# Patient Record
Sex: Male | Born: 1970
Health system: Southern US, Community
[De-identification: ages and names within clinical notes are randomized; demographics above are authoritative.]

## PROBLEM LIST (undated history)

## (undated) DIAGNOSIS — I2510 Atherosclerotic heart disease of native coronary artery without angina pectoris: Secondary | ICD-10-CM

## (undated) DIAGNOSIS — I10 Essential (primary) hypertension: Secondary | ICD-10-CM

## (undated) DIAGNOSIS — E119 Type 2 diabetes mellitus without complications: Secondary | ICD-10-CM

## (undated) DIAGNOSIS — G4730 Sleep apnea, unspecified: Secondary | ICD-10-CM

---

## 2002-07-14 HISTORY — PX: CORONARY ANGIOPLASTY WITH STENT PLACEMENT: SHX49

## 2012-08-20 DIAGNOSIS — R079 Chest pain, unspecified: Secondary | ICD-10-CM | POA: Insufficient documentation

## 2015-04-08 DIAGNOSIS — Z8673 Personal history of transient ischemic attack (TIA), and cerebral infarction without residual deficits: Secondary | ICD-10-CM | POA: Insufficient documentation

## 2015-04-08 DIAGNOSIS — I639 Cerebral infarction, unspecified: Secondary | ICD-10-CM | POA: Insufficient documentation

## 2015-05-14 DIAGNOSIS — Z820 Family history of epilepsy and other diseases of the nervous system: Secondary | ICD-10-CM | POA: Insufficient documentation

## 2016-03-24 DIAGNOSIS — R0609 Other forms of dyspnea: Secondary | ICD-10-CM | POA: Insufficient documentation

## 2016-03-24 DIAGNOSIS — R0602 Shortness of breath: Secondary | ICD-10-CM | POA: Insufficient documentation

## 2016-03-24 DIAGNOSIS — R0600 Dyspnea, unspecified: Secondary | ICD-10-CM | POA: Insufficient documentation

## 2016-05-29 DIAGNOSIS — G4733 Obstructive sleep apnea (adult) (pediatric): Secondary | ICD-10-CM | POA: Insufficient documentation

## 2017-05-07 DIAGNOSIS — F418 Other specified anxiety disorders: Secondary | ICD-10-CM | POA: Insufficient documentation

## 2018-06-04 ENCOUNTER — Other Ambulatory Visit: Payer: Self-pay

## 2018-06-04 ENCOUNTER — Emergency Department (HOSPITAL_COMMUNITY)
Admission: EM | Admit: 2018-06-04 | Discharge: 2018-06-04 | Disposition: A | Discharge status: Home / Self Care | Payer: Self-pay | Attending: Emergency Medicine | Admitting: Emergency Medicine

## 2018-06-04 ENCOUNTER — Emergency Department (HOSPITAL_COMMUNITY): Payer: Self-pay

## 2018-06-04 ENCOUNTER — Encounter (HOSPITAL_COMMUNITY): Payer: Self-pay | Admitting: Emergency Medicine

## 2018-06-04 DIAGNOSIS — I2510 Atherosclerotic heart disease of native coronary artery without angina pectoris: Secondary | ICD-10-CM | POA: Insufficient documentation

## 2018-06-04 DIAGNOSIS — E119 Type 2 diabetes mellitus without complications: Secondary | ICD-10-CM | POA: Insufficient documentation

## 2018-06-04 DIAGNOSIS — I10 Essential (primary) hypertension: Secondary | ICD-10-CM | POA: Insufficient documentation

## 2018-06-04 DIAGNOSIS — L02612 Cutaneous abscess of left foot: Secondary | ICD-10-CM | POA: Insufficient documentation

## 2018-06-04 DIAGNOSIS — L03032 Cellulitis of left toe: Secondary | ICD-10-CM | POA: Insufficient documentation

## 2018-06-04 HISTORY — DX: Essential (primary) hypertension: I10

## 2018-06-04 HISTORY — DX: Atherosclerotic heart disease of native coronary artery without angina pectoris: I25.10

## 2018-06-04 HISTORY — DX: Type 2 diabetes mellitus without complications: E11.9

## 2018-06-04 LAB — BASIC METABOLIC PANEL
ANION GAP: 10 (ref 5–15)
BUN: 9 mg/dL (ref 6–20)
CO2: 26 mmol/L (ref 22–32)
Calcium: 9.4 mg/dL (ref 8.9–10.3)
Chloride: 97 mmol/L — ABNORMAL LOW (ref 98–111)
Creatinine, Ser: 0.67 mg/dL (ref 0.61–1.24)
GFR calc non Af Amer: >60 mL/min (ref >60)
Glucose, Bld: 358 mg/dL — ABNORMAL HIGH (ref 70–99)
Potassium: 4.2 mmol/L (ref 3.5–5.1)
SODIUM: 133 mmol/L — AB (ref 135–145)

## 2018-06-04 LAB — CBC WITH DIFFERENTIAL/PLATELET
Abs Immature Granulocytes: 0.04 10*3/uL (ref 0.00–0.07)
BASOS ABS: 0.1 10*3/uL (ref 0.0–0.1)
BASOS PCT: 1 %
Eosinophils Absolute: 0.1 10*3/uL (ref 0.0–0.5)
Eosinophils Relative: 2 %
HEMATOCRIT: 46.6 % (ref 39.0–52.0)
HEMOGLOBIN: 13.9 g/dL (ref 13.0–17.0)
Immature Granulocytes: 1 %
Lymphocytes Relative: 28 %
Lymphs Abs: 2.4 10*3/uL (ref 0.7–4.0)
MCH: 25.6 pg — ABNORMAL LOW (ref 26.0–34.0)
MCHC: 29.8 g/dL — AB (ref 30.0–36.0)
MCV: 86 fL (ref 80.0–100.0)
MONO ABS: 0.6 10*3/uL (ref 0.1–1.0)
Monocytes Relative: 7 %
NEUTROS ABS: 5.1 10*3/uL (ref 1.7–7.7)
NRBC: 0 % (ref 0.0–0.2)
Neutrophils Relative %: 61 %
Platelets: 348 10*3/uL (ref 150–400)
RBC: 5.42 MIL/uL (ref 4.22–5.81)
RDW: 11.5 % (ref 11.5–15.5)
WBC: 8.3 10*3/uL (ref 4.0–10.5)

## 2018-06-04 LAB — CBG MONITORING, ED: GLUCOSE-CAPILLARY: 359 mg/dL — AB (ref 70–99)

## 2018-06-04 MED ORDER — CLINDAMYCIN HCL 150 MG PO CAPS: 150.0000 mg | ORAL_CAPSULE | Freq: Four times a day (QID) | ORAL | 0 refills | Status: DC

## 2018-06-04 NOTE — ED Provider Notes (Signed)
I saw and evaluated the patient, reviewed the resident's note and I agree with the findings and plan with the following exceptions.   47 year old male with history of diabetes a presents the emergency department today with swelling and tenderness to left big toe.  On exam has redness around the toenail with an obvious area of purulence and a crescent-shaped approximately half a centimeter wide.  Has likely neuropathy from diabetes and not much pain there.  Plan to I&D and start antibiotics   EKG Interpretation  Date/Time:    Ventricular Rate:    PR Interval:    QRS Duration:   QT Interval:    QTC Calculation:   R Axis:     Text Interpretation:           Marily Memos, MD 06/05/18 2304

## 2018-06-04 NOTE — ED Triage Notes (Signed)
Pt presents with swelling/redness to left great toe. Hx diabetes and neuropathy. States blood sugars have been consistently >300. Denies fevers at home.

## 2018-06-04 NOTE — ED Provider Notes (Signed)
Patient placed in Quick Look pathway, seen and evaluated   Chief Complaint: Left great toe infection  HPI:  Austin Vaughan is a 47 y.o. male who presents to the emergency department for evaluation of infection to his left great toe.  He reports when he was in the shower he noticed a pocket of pus on the medial aspect of his toe with surrounding redness and it was very tender to the touch.  He denies any injury or trauma to the area.  He has not had any fevers or chills, no nausea or vomiting.  Patient is diabetic and he reports his sugars have been running in the 300s recently.  He takes insulin and metformin regularly.  ROS: + Paronychia, -fevers, chills, nausea, vomiting  Physical Exam:   Gen: No distress  Neuro: Awake and Alert  Skin: Warm    Focused Exam: Large paronychia over the medial aspect of the left great toe with surrounding cellulitis, the pad of the toe is soft and not fluctuant, no concern for felon, no streaking, 2+ DP and PT pulses   Initiation of care has begun. The patient has been counseled on the process, plan, and necessity for staying for the completion/evaluation, and the remainder of the medical screening examination   Legrand Rams 06/04/18 2007    Mesner, Barbara Cower, MD 06/04/18 [phone]

## 2018-06-04 NOTE — ED Provider Notes (Signed)
[facility] EMERGENCY DEPARTMENT Provider Note   CSN: [id number] Arrival date & time: 06/04/18  1938     History   Chief Complaint Chief Complaint  Patient presents with  . Wound Infection    HPI Austin Vaughan is a 47 y.o. male.  HPI She is a 47 year old male with a past medical history of diabetes, CAD, and hypertension who presents to the emergency department for evaluation of infection to his left great toe.  Patient reports that earlier this morning he noticed a pocket of pus on the medial aspect of his left great toe.  He does also have some surrounding erythema but denies any significant pain to the area.  He does have his normal neuropathic pain still present but nothing worse than normal.  He denies any fevers or chills.  He has no chest pain, nausea, or vomiting.  He does report that his baseline blood sugar runs in the 300s.  Of note he just moved from [place] and does not currently have a primary care provider.  Past Medical History:  Diagnosis Date  . Coronary artery disease   . Diabetes mellitus without complication (HCC)   . Hypertension     There are no active problems to display for this patient.   History reviewed. No pertinent surgical history.      Home Medications    Prior to Admission medications   Medication Sig Start Date End Date Taking? Authorizing Provider  clindamycin (CLEOCIN) 150 MG capsule Take 1 capsule (150 mg total) by mouth every 6 (six) hours. 06/04/18   Marionna Gonia, Winfield Rast, MD    Family History No family history on file.  Social History Social History   Tobacco Use  . Smoking status: Never Smoker  . Smokeless tobacco: Never Used  Substance Use Topics  . Alcohol use: Not Currently  . Drug use: Never     Allergies   Patient has no known allergies.   Review of Systems Review of Systems  Constitutional: Negative for chills and fever.  HENT: Negative for ear pain and sore throat.   Eyes: Negative for  pain and visual disturbance.  Respiratory: Negative for cough and shortness of breath.   Cardiovascular: Negative for chest pain and palpitations.  Gastrointestinal: Negative for abdominal pain and vomiting.  Genitourinary: Negative for dysuria and hematuria.  Musculoskeletal: Negative for arthralgias and back pain.  Skin: Positive for color change. Negative for rash.       Purulent fluid collection on patients left great toe.   Neurological: Negative for seizures and syncope.  All other systems reviewed and are negative.    Physical Exam Updated Vital Signs BP (!) 136/94   Pulse 88   Temp 99.7 F (37.6 C) (Oral)   Resp 17   Ht 6\' 2"  (1.88 m)   Wt 102.5 kg   SpO2 100%   BMI 29.02 kg/m   Physical Exam  Constitutional: He appears well-developed and well-nourished.  HENT:  Head: Normocephalic and atraumatic.  Eyes: Conjunctivae are normal.  Neck: Neck supple.  Cardiovascular: Normal rate and regular rhythm.  No murmur heard. Pulmonary/Chest: Effort normal and breath sounds normal. No respiratory distress.  Abdominal: Soft. There is no tenderness.  Musculoskeletal: He exhibits no edema.  Patient with crescent-shaped area of fluctuance present around the nail of his left great toe.  There is some surrounding erythema.  Neurological: He is alert.  Skin: Skin is warm and dry.  Psychiatric: He has a normal mood and  affect.  Nursing note and vitals reviewed.    ED Treatments / Results  Labs (all labs ordered are listed, but only abnormal results are displayed) Labs Reviewed  CBC WITH DIFFERENTIAL/PLATELET - Abnormal; Notable for the following components:      Result Value   MCH 25.6 (*)    MCHC 29.8 (*)    All other components within normal limits  BASIC METABOLIC PANEL - Abnormal; Notable for the following components:   Sodium 133 (*)    Chloride 97 (*)    Glucose, Bld 358 (*)    All other components within normal limits  CBG MONITORING, ED - Abnormal; Notable for  the following components:   Glucose-Capillary 359 (*)    All other components within normal limits    EKG None  Radiology Dg Toe Great Left  Result Date: 06/04/2018 CLINICAL DATA:  Toe infection redness at the nail bed EXAM: LEFT GREAT TOE COMPARISON:  None. FINDINGS: No fracture or malalignment.  No periostitis or bone destruction. IMPRESSION: No acute osseous abnormality Electronically Signed   By: Jasmine Pang M.D.   On: 06/04/2018 20:42    Procedures .Marland KitchenIncision and Drainage Date/Time: 06/05/2018 9:35 PM Performed by: Keith Rake, MD Authorized by: Keith Rake, MD   Consent:    Consent obtained:  Verbal   Consent given by:  Patient   Risks discussed:  Bleeding, incomplete drainage, pain and infection   Alternatives discussed:  No treatment Location:    Type:  Abscess   Location:  Lower extremity   Lower extremity location:  Toe   Toe location:  L big toe Anesthesia (see MAR for exact dosages):    Anesthesia method:  None Procedure type:    Complexity:  Simple Procedure details:    Incision type: 18G needle used to to lift skin from underlying nail.   Incision depth:  Dermal   Drainage:  Purulent   Drainage amount:  Moderate   Wound treatment:  Wound left open   Packing materials:  None Post-procedure details:    Patient tolerance of procedure:  Tolerated well, no immediate complications   (including critical care time)  Medications Ordered in ED Medications - No data to display   Initial Impression / Assessment and Plan / ED Course  I have reviewed the triage vital signs and the nursing notes.  Pertinent labs & imaging results that were available during my care of the patient were reviewed by me and considered in my medical decision making (see chart for details).     Patient is a 47 year old male with past medical history as detailed above who presents emergency department for evaluation of left toe infection.  Patient states that he noticed  the swelling around his left great toe nail earlier this afternoon.  On exam patient does have a crescent-shaped area of fluctuance surrounding the medial aspect of his left great toenail.  Drainage with an 18-gauge needle was performed at bedside.  Patient tolerated this well.  Patient does have some erythema concerning for possible early cellulitis.  As a result patient will be discharged from the emergency department with a prescription for clindamycin.  Also of note, patient did have an elevated blood sugar while in the emergency department.  He has no anion gap or other symptoms to suggest he is currently in DKA.  He is very well-appearing and in no acute distress.  Given the patient is just moved from [place], Sports coach saw him while in the emergency department  and had [facility]  Patient was given contact information for this office.  She was in no acute distress at time of discharge.  Strict return precautions given should his foot infection show any signs of worsening or if he should develop any systemic symptoms such as fevers or chills.  The care of this patient was discussed with my attending physician Dr. Clayborne Dana, who voices agreement with work-up and ED disposition.  Final Clinical Impressions(s) / ED Diagnoses   Final diagnoses:  Abscess of toenail on left foot  Cellulitis of toe of left foot    ED Discharge Orders         Ordered    clindamycin (CLEOCIN) 150 MG capsule  Every 6 hours     06/04/18 2129           Keith Rake, MD 06/05/18 2138    Marily Memos, MD 06/05/18 [phone]

## 2018-06-04 NOTE — ED Notes (Signed)
Patient here for left great toe nail swelling. Denies pain because he has neuropathy to extremities. Reported he has hx of diabetic and only saw this today.

## 2018-06-04 NOTE — Progress Notes (Signed)
EDCM spoke with the patient and his wife at the bedside. Patient states he recently moved here from [place], [place]. He is on disability. He has applied for [place] [employer]. Wife reports he will have Medicare in December. He needs a PCP. Provided the contact information/flyer for [facility] and [facility] and encouraged the patient to call tomorrow to schedule an appointment. Patient verbalizes understanding. Rubie Maid RN CCM

## 2018-06-11 ENCOUNTER — Ambulatory Visit: Payer: Self-pay | Attending: Family Medicine | Admitting: Physician Assistant

## 2018-06-11 VITALS — BP 112/73 | HR 92 | Temp 97.5°F | Resp 18 | Ht 74.0 in | Wt 226.6 lb

## 2018-06-11 DIAGNOSIS — L02612 Cutaneous abscess of left foot: Secondary | ICD-10-CM

## 2018-06-11 DIAGNOSIS — Z9119 Patient's noncompliance with other medical treatment and regimen: Secondary | ICD-10-CM | POA: Insufficient documentation

## 2018-06-11 DIAGNOSIS — Z794 Long term (current) use of insulin: Secondary | ICD-10-CM | POA: Insufficient documentation

## 2018-06-11 DIAGNOSIS — I2510 Atherosclerotic heart disease of native coronary artery without angina pectoris: Secondary | ICD-10-CM | POA: Insufficient documentation

## 2018-06-11 DIAGNOSIS — I10 Essential (primary) hypertension: Secondary | ICD-10-CM | POA: Insufficient documentation

## 2018-06-11 DIAGNOSIS — E1165 Type 2 diabetes mellitus with hyperglycemia: Secondary | ICD-10-CM | POA: Insufficient documentation

## 2018-06-11 DIAGNOSIS — L03032 Cellulitis of left toe: Secondary | ICD-10-CM | POA: Insufficient documentation

## 2018-06-11 DIAGNOSIS — E1142 Type 2 diabetes mellitus with diabetic polyneuropathy: Secondary | ICD-10-CM

## 2018-06-11 LAB — GLUCOSE, POCT (MANUAL RESULT ENTRY): POC Glucose: 243 mg/dl — AB (ref 70–99)

## 2018-06-11 MED ORDER — NORTRIPTYLINE HCL 50 MG PO CAPS: 50.0000 mg | ORAL_CAPSULE | Freq: Every day | ORAL | 5 refills | Status: DC

## 2018-06-11 MED ORDER — GABAPENTIN 600 MG PO TABS: 4800.0000 mg | ORAL_TABLET | Freq: Every day | ORAL | 5 refills | Status: DC

## 2018-06-11 MED ORDER — ATORVASTATIN CALCIUM 40 MG PO TABS: 40.0000 mg | ORAL_TABLET | Freq: Every day | ORAL | 5 refills | Status: DC

## 2018-06-11 MED FILL — NORTRIPTYLINE HCL 50 MG CAP: 50 | 30 days supply | Qty: 30 | Fill #0

## 2018-06-11 MED FILL — GABAPENTIN 600 MG TABLET: 600 | 30 days supply | Qty: 240 | Fill #0

## 2018-06-11 MED FILL — ATORVASTATIN 40 MG TABLET: 40 | 30 days supply | Qty: 30 | Fill #0

## 2018-06-11 NOTE — Progress Notes (Signed)
Austin Vaughan  ZOX:[id number]  WJX:[id number]  DOB - 1971/07/24  Chief Complaint  Patient presents with  . Follow-up    Left Big Toe Abcess>ED       Subjective:   Austin Vaughan is a 47 y.o. male here today for establishment of care.  He has a past medical history of diabetes mellitus type 2 with diabetic neuropathy, coronary artery disease status post percutaneous coronary intervention in the past and hypertension.  He recently moved here (1 month ago) from [place].  He presented to the emergency department on 06/04/2018 with pus being noted on his left foot at the great toe at the medial aspect.  He was having redness and tenderness.  He did not recall any type of injury.  He had not been running any fevers.  He went to the emergency department and his blood sugar was in the 300s but he did not have an anion gap.  His temperature was 99.7.  His physical examination was positive for large paronychia of the left great toe.  X-ray was negative for any type of osteomyelitis.  He status post incision and drainage with good response.  He then was placed on clindamycin for 7 days.  In regards to his blood sugar he was encouraged to go back on his last known regimen which was Lantus 12 units at bedtime, NovoLog 14 units 3 times daily with meals, Glucotrol 10 mg daily.  He has been taking the Glucotrol and Lantus but has not been taking the NovoLog per sliding scale with meals.  He has been compliant with his other medications.  He is inconsistent with how he checks his blood sugar.  Usually he only checks it once every 3 or 4 days.  His hemoglobin A1c in June of this year was 9.2%.  His foot is fine.  No new complaints there.   ROS: GEN: denies fever or chills, denies change in weight Skin: denies lesions or rashes HEENT: denies headache, earache, epistaxis, sore throat, or neck pain LUNGS: denies SHOB, dyspnea, PND, orthopnea CV: denies CP or palpitations ABD: denies abd pain, N or V EXT: denies  muscle spasms or swelling; no pain in lower ext, no weakness NEURO: denies numbness or tingling, denies sz, stroke or TIA  ALLERGIES: Allergies  Allergen Reactions  . Ambien [Zolpidem Tartrate] Other (See Comments)    HALLUCINATIONS  . Shellfish Allergy Anaphylaxis    Seafood    PAST MEDICAL HISTORY: Past Medical History:  Diagnosis Date  . Coronary artery disease   . Diabetes mellitus without complication (HCC)   . Hypertension     PAST SURGICAL HISTORY: History reviewed. No pertinent surgical history.  MEDICATIONS AT HOME: Prior to Admission medications   Medication Sig Start Date End Date Taking? Authorizing Provider  albuterol (PROAIR HFA) 108 (90 Base) MCG/ACT inhaler Inhale 2 puffs into the lungs every 6 (six) hours as needed for wheezing or shortness of breath.   Yes [provider]  amLODipine (NORVASC) 10 MG tablet Take 10 mg by mouth daily.   Yes [provider]  atorvastatin (LIPITOR) 40 MG tablet Take 1 tablet (40 mg total) by mouth daily. 06/11/18  Yes Danelle Earthly, Raphael Espe S, PA-C  clindamycin (CLEOCIN) 150 MG capsule Take 1 capsule (150 mg total) by mouth every 6 (six) hours. 06/04/18  Yes Rozier, Winfield Rast, MD  dicyclomine (BENTYL) 20 MG tablet Take 20 mg by mouth 4 (four) times daily.   Yes [provider]  gabapentin (NEURONTIN) 600 MG  tablet Take 8 tablets (4,800 mg total) by mouth at bedtime. Yes, 4800 mg daily 06/11/18  Yes Barbaraann Avans S, PA-C  glipiZIDE (GLUCOTROL) 10 MG tablet Take 10 mg by mouth daily before breakfast.   Yes [provider]  insulin aspart (NOVOLOG FLEXPEN) 100 UNIT/ML FlexPen Inject 14 Units into the skin 3 (three) times daily with meals.   Yes [provider]  Insulin Glargine (LANTUS SOLOSTAR) 100 UNIT/ML Solostar Pen Inject 12 Units into the skin at bedtime.   Yes [provider]  montelukast (SINGULAIR) 10 MG tablet Take 10 mg by mouth daily.   Yes [provider]  nitroGLYCERIN  (NITRODUR - DOSED IN MG/24 HR) 0.4 mg/hr patch Place 0.4 mg onto the skin daily.   Yes [provider]  nitroGLYCERIN (NITROSTAT) 0.4 MG SL tablet Place 0.4 mg under the tongue every 5 (five) minutes as needed for chest pain.   Yes [provider]  nortriptyline (PAMELOR) 50 MG capsule Take 1 capsule (50 mg total) by mouth at bedtime. 06/11/18  Yes Danelle Earthly, Laneah Luft S, PA-C  ramipril (ALTACE) 10 MG capsule Take 10 mg by mouth daily.   Yes [provider]    History reviewed. No pertinent family history.  @SOCIALHX @  Objective:   Vitals:   06/11/18 1149  BP: 112/73  Pulse: 92  Resp: 18  Temp: (!) 97.5 F (36.4 C)  TempSrc: Oral  SpO2: 98%  Weight: 226 lb 9.6 oz (102.8 kg)  Height: 6\' 2"  (1.88 m)    Exam General appearance : Awake, alert, not in any distress. Speech Clear. Not toxic looking HEENT: Atraumatic and Normocephalic, pupils equally reactive to light and accomodation Neck: supple, no JVD. No cervical lymphadenopathy.  Chest:Good air entry bilaterally, no added sounds  CVS: S1 S2 regular, no murmurs.  Abdomen: Bowel sounds present, Non tender and not distended with no guarding, rigidity or rebound. Extremities: B/L Lower Ext shows no edema, both legs are warm to touch Neurology: Awake alert, and oriented X 3, CN II-XII intact, Non focal Skin:No Rash Wounds:healing left medial great toe   Assessment & Plan  1. Left great toe cellulitis/abscess-  -s/p I & D  -completed antibiotics 2. DM 2 with hyperglycemia and noncompliance  -education  -cont Lantus 12 U daily, likely will need to increase  -Novolog 14 U TID with meals  -accu check TID-QID and keep a log and bring to next visit 3. HTN-stable 4. CAD-stable   appt with pharm D appt with financial counselor Return in about 2 weeks (around 06/25/2018).  The patient was given clear instructions to go to ER or return to medical center if symptoms don't improve, worsen or new problems  develop. The patient verbalized understanding. The patient was told to call to get lab results if they haven't heard anything in the next week.   Total time spent with patient was 49 min. Greater than 50 % of this visit was spent face to face counseling and coordinating care regarding risk factor modification, compliance importance and encouragement, education related to diabetes.  This note has been created with Education officer, environmental. Any transcriptional errors are unintentional.    Scot Jun, PA-C [facility] [place], [place] [phone]   06/11/2018, 1:32 PM

## 2018-06-11 NOTE — Patient Instructions (Signed)
Aim for 30 minutes of exercise most days. Rethink what you drink. Water is great! Aim for 2-3 Carb Choices per meal (30-45 grams) +/- 1 either way  Aim for 0-15 Carbs per snack if hungry  Include protein in moderation with your meals and snacks  Consider reading food labels for Total Carbohydrate and Fat Grams of foods  Consider checking BG at alternate times per day  Continue taking medication as directed Be mindful about how much sugar you are adding to beverages and other foods. Fruit Punch - find one with no sugar  Measure and decrease portions of carbohydrate foods  Make your plate and don't go back for seconds  

## 2018-06-11 NOTE — Progress Notes (Signed)
253

## 2018-06-24 ENCOUNTER — Ambulatory Visit: Payer: Self-pay | Attending: Family Medicine

## 2018-06-25 ENCOUNTER — Ambulatory Visit: Payer: Self-pay | Admitting: Family Medicine

## 2018-07-01 ENCOUNTER — Other Ambulatory Visit: Payer: Self-pay | Admitting: Pharmacist

## 2018-07-01 MED ORDER — AMLODIPINE BESYLATE 10 MG PO TABS: 10.0000 mg | ORAL_TABLET | Freq: Every day | ORAL | 0 refills | Status: DC

## 2018-07-01 MED ORDER — RAMIPRIL 10 MG PO CAPS: 10.0000 mg | ORAL_CAPSULE | Freq: Every day | ORAL | 0 refills | Status: DC

## 2018-07-01 MED FILL — RAMIPRIL 5 MG CAPS: 5 | 8 days supply | Qty: 16 | Fill #0

## 2018-07-01 MED FILL — AMLODIPINE BESYLATE 10 MG T: 10 | 8 days supply | Qty: 8 | Fill #0

## 2018-07-08 ENCOUNTER — Encounter: Payer: Self-pay | Admitting: Family Medicine

## 2018-07-08 ENCOUNTER — Ambulatory Visit: Payer: Self-pay | Attending: Family Medicine | Admitting: Family Medicine

## 2018-07-08 VITALS — BP 142/94 | HR 101 | Temp 98.1°F | Ht 74.0 in | Wt 228.0 lb

## 2018-07-08 DIAGNOSIS — E0842 Diabetes mellitus due to underlying condition with diabetic polyneuropathy: Secondary | ICD-10-CM

## 2018-07-08 DIAGNOSIS — Z79899 Other long term (current) drug therapy: Secondary | ICD-10-CM

## 2018-07-08 DIAGNOSIS — R079 Chest pain, unspecified: Secondary | ICD-10-CM

## 2018-07-08 DIAGNOSIS — I2510 Atherosclerotic heart disease of native coronary artery without angina pectoris: Secondary | ICD-10-CM

## 2018-07-08 DIAGNOSIS — R0789 Other chest pain: Secondary | ICD-10-CM | POA: Insufficient documentation

## 2018-07-08 DIAGNOSIS — Z794 Long term (current) use of insulin: Secondary | ICD-10-CM

## 2018-07-08 DIAGNOSIS — E1142 Type 2 diabetes mellitus with diabetic polyneuropathy: Secondary | ICD-10-CM

## 2018-07-08 DIAGNOSIS — R1084 Generalized abdominal pain: Secondary | ICD-10-CM

## 2018-07-08 DIAGNOSIS — I10 Essential (primary) hypertension: Secondary | ICD-10-CM

## 2018-07-08 LAB — POCT GLYCOSYLATED HEMOGLOBIN (HGB A1C): Hemoglobin A1C: 10 % — AB (ref 4.0–5.6)

## 2018-07-08 LAB — GLUCOSE, POCT (MANUAL RESULT ENTRY): POC Glucose: 480 mg/dL — AB (ref 70–99)

## 2018-07-08 MED ORDER — ATORVASTATIN CALCIUM 40 MG PO TABS: 40.0000 mg | ORAL_TABLET | Freq: Every day | ORAL | 5 refills | Status: DC

## 2018-07-08 MED ORDER — GABAPENTIN 600 MG PO TABS: 3600.0000 mg | ORAL_TABLET | Freq: Every day | ORAL | 5 refills | Status: DC

## 2018-07-08 MED ORDER — GLIPIZIDE 10 MG PO TABS: 10.0000 mg | ORAL_TABLET | Freq: Every day | ORAL | 4 refills | Status: DC

## 2018-07-08 MED ORDER — AMLODIPINE BESYLATE 10 MG PO TABS: 10.0000 mg | ORAL_TABLET | Freq: Every day | ORAL | 0 refills | Status: DC

## 2018-07-08 MED ORDER — INSULIN GLARGINE 100 UNIT/ML SOLOSTAR PEN: 20.0000 [IU] | PEN_INJECTOR | Freq: Every day | SUBCUTANEOUS | 3 refills | Status: DC

## 2018-07-08 MED FILL — AMLODIPINE BESYLATE 10 MG T: 10 | 8 days supply | Qty: 8 | Fill #0

## 2018-07-08 MED FILL — ATORVASTATIN CALCIUM 40 MG: 40 | 30 days supply | Qty: 30 | Fill #0

## 2018-07-08 MED FILL — NORTRIPTYLINE HCL 50 MG CAP: 50 | 30 days supply | Qty: 30 | Fill #1

## 2018-07-08 MED FILL — LANTUS SOLOSTAR 100 UNITS/M: 100 | 30 days supply | Qty: 6 | Fill #0

## 2018-07-08 MED FILL — GABAPENTIN 600 MG TABLET: 600 | 30 days supply | Qty: 180 | Fill #0

## 2018-07-08 MED FILL — glipiZIDE 10 MG TABS: 10 | 30 days supply | Qty: 30 | Fill #0

## 2018-07-08 NOTE — Progress Notes (Signed)
Subjective:    Patient ID: Austin Vaughan, male    DOB: 1971/06/02, 47 y.o.   MRN: [id number]  HPI       47 year old male who is new to me as a patient but was seen in this office on 06/11/2018 to establish care status post emergency department visit on 06/04/2018 due to an abscess of the left great toe near the toenail.  Patient has a history of type 2 diabetes with diabetic neuropathy.  Patient reports that he recently moved to the area from [place].  Patient reports that he has neuropathy in both his hands and feet and patient reports that he takes 8-10 gabapentin 600 mg at bedtime along with 7-8 OTC ibuprofen as his pain is greater than 200 on a 0-10 pain scale was 0 being no pain and 10 being the worst pain imaginable.  Patient has taken reflux medication in the past but does not currently taking reflux medicine but does take a medication for abdominal pain.  Patient also reports a history of coronary artery disease and had a stress test just before he left [place].  Patient has had some recurrent dull chest pain.  Chest pain is generally substernal and left-sided.  Patient states that he is not aware of the results.  Patient reports prior heart cath and stenting.  Patient reports that he is taking the insulin that was prescribed for his diabetes but his blood sugars continue to run in the 200s and 300s.  Patient reports that he does need some medication refills at today's visit.      Patient reports past medical history of heart disease status post stenting as well as diabetes with neuropathy and hypertension.  Patient reports a history of allergy to Ambien, wife who is with the patient at today's visit reports that the patient became violent after taking the medication.  Patient reports that his only surgery has been a heart cath.  Patient states that he does not drink or smoke.  Patient reports a family history significant for his mother and 2 maternal aunts all having ALS.  Patient reports that his  father was a smoker and died from lung cancer.  Patient reports a strong history of CAD on the paternal side of his family.          Review of Systems  Constitutional: Positive for fatigue. Negative for chills and fever.  HENT: Positive for congestion and postnasal drip. Negative for trouble swallowing.   Eyes: Positive for itching. Negative for photophobia and visual disturbance.  Respiratory: Negative for cough and shortness of breath.   Cardiovascular: Positive for chest pain and palpitations (Sensation of increased heart rate at times). Negative for leg swelling.  Gastrointestinal: Positive for abdominal pain (Complain of generalized, crampy type abdominal pain). Negative for blood in stool and nausea.  Endocrine: Negative for polydipsia, polyphagia and polyuria.  Genitourinary: Negative for dysuria and frequency.  Musculoskeletal: Positive for arthralgias and gait problem.  Neurological: Positive for numbness. Negative for dizziness and light-headedness.       Objective:   Physical Exam BP (!) 142/94   Pulse (!) 101   Temp 98.1 F (36.7 C) (Oral)   Ht 6\' 2"  (1.88 m)   Wt 228 lb (103.4 kg)   SpO2 97%   BMI 29.27 kg/m Nurse's notes and vital signs reviewed General-well-nourished, well-developed male in no acute distress.  Patient is accompanied by his wife at today's visit. EENT- conjunctiva clear, extraocular movements intact, TMs dull bilaterally, nares  with mild edema, patient with mild posterior pharynx erythema Neck-supple, no lymphadenopathy, no thyromegaly Cardiovascular-regular rate and rhythm Abdomen-soft, nontender Back-no CVA tenderness. Extremities-no edema Diabetic foot exam- patient does not have any evidence of edema/erythema or increased warmth of the left great toe and does not have any tenderness to palpation of this area where he previously had an abscess that required I&D.  Patient with 1+ peripheral pulses-dorsalis pedis and posterior tibial bilaterally.   Patient with abnormal monofilament bilaterally as patient only sensed palpation on the top and not the soles of the feet.  No active skin breakdown on the feet        Assessment & Plan:  1. Type 2 diabetes mellitus with diabetic polyneuropathy, with long-term current use of insulin (HCC) Patient had hemoglobin A1c done at today's visit as I did not see prior A1c on review of labs.  Patient's hemoglobin A1c was at 10.0 indicating poor control of his diabetes.  Patient will also have CMP and CBC.  Patient requests a refill of glipizide as well as Lantus and these were refilled with patient is also encouraged to bring diabetic monitor and diary and make follow-up appointment to see the clinical pharmacist to help with control of his blood sugars. - POCT glucose (manual entry) - POCT glycosylated hemoglobin (Hb A1C) - Comprehensive metabolic panel - CBC with Differential - glipiZIDE (GLUCOTROL) 10 MG tablet; Take 1 tablet (10 mg total) by mouth daily before breakfast.  Dispense: 30 tablet; Refill: 4 - Insulin Glargine (LANTUS SOLOSTAR) 100 UNIT/ML Solostar Pen; Inject 20 Units into the skin at bedtime.  Dispense: 15 mL; Refill: 3  2. Coronary artery disease involving native coronary artery of native heart, angina presence unspecified Patient with history of coronary artery disease and reports that he had a stress test prior to leaving [place] but is not aware the results.  Patient reports he has had some recurrent left-sided chest pain.  Patient is being referred to cardiology for further evaluation and treatment and patient is to continue use of a daily aspirin as well as atorvastatin 40 for which he was provided with a refill at today's visit. - Ambulatory referral to Cardiology - atorvastatin (LIPITOR) 40 MG tablet; Take 1 tablet (40 mg total) by mouth daily.  Dispense: 30 tablet; Refill: 5  3. Recurrent chest pain Patient is being referred to cardiology secondary to his complaint of recurrent  chest pain and patient with known history of coronary artery disease - Ambulatory referral to Cardiology  4. Diabetic polyneuropathy associated with diabetes mellitus due to underlying condition [facility]) Patient requests referral to neurology in follow-up of his polyneuropathy.  Patient reported taking 8 gabapentin and 6 OTC ibuprofen each night to help with pain.  Patient is also being referred to pain clinic to see if there are any other options.  I discussed with the patient and his wife that a 600 mg gabapentin was likely excessive.  I spoke briefly with the clinical pharmacist and patient is on a very high dose, likely excessive of gabapentin especially since his dose is not divided throughout the day.  Patient was provided with prescription for gabapentin to take up to 6 pills at bedtime and patient was advised to make sure that he is eaten prior to taking ibuprofen and that he should try not to exceed more than 800 mg of Motrin every 8 hours.  Patient will have CMP and CBC in follow-up of his excessive use of medication. - Ambulatory referral to Neurology -  gabapentin (NEURONTIN) 600 MG tablet; Take 6 tablets (3,600 mg total) by mouth at bedtime.  Dispense: 240 tablet; Refill: 5 - Ambulatory referral to Pain Clinic  5. Generalized abdominal pain Patient with complaint of some generalized abdominal pain.  Patient will have CMP and CBC.  Patient will be referred to gastroenterology for further evaluation and treatment.  Patient is currently taking an excessive amount of ibuprofen and gabapentin nightly.  Patient denies any blood in the stool and no nausea.  Patient however states that he has been taking medication for abdominal pain and is provided with a prescription for Bentyl.  Patient declines reflux medication. - Ambulatory referral to Gastroenterology - Comprehensive metabolic panel - CBC with Differential  6. Long-term use of high-risk medication Patient will be referred to gastroenterology  due to his long-term use of ibuprofen as well as complaint of generalized abdominal pain.  Patient will have CBC and CMP done in follow-up of long-term use of medications - Ambulatory referral to Gastroenterology - Comprehensive metabolic panel - CBC with Differential  7. Essential hypertension Patient's blood pressure was slightly elevated above normal, I am not sure if this is related to patient's current pain complaints.  Patient is provided a refill of amlodipine and is currently on metoprolol but likely would also benefit from an ACE inhibitor or ARB since patient is diabetic.  Will discuss with patient his next visit and also obtain a urine microalbumin. - amLODipine (NORVASC) 10 MG tablet; Take 1 tablet (10 mg total) by mouth daily.  Dispense: 8 tablet; Refill: 0  *Patient was offered but declined influenza immunization at today's visit  An After Visit Summary was printed and given to the patient.  Return in about 6 weeks (around 08/19/2018) for DM; 2-3 weeks with Franky MachoLuke; .

## 2018-07-09 ENCOUNTER — Encounter: Payer: Self-pay | Admitting: Neurology

## 2018-07-09 LAB — CBC WITH DIFFERENTIAL/PLATELET
Basophils Absolute: 0.1 x10E3/uL (ref 0.0–0.2)
Basos: 1 %
EOS (ABSOLUTE): 0.2 x10E3/uL (ref 0.0–0.4)
Eos: 2 %
Hematocrit: 46.3 % (ref 37.5–51.0)
Hemoglobin: 14.8 g/dL (ref 13.0–17.7)
Immature Grans (Abs): 0 x10E3/uL (ref 0.0–0.1)
Immature Granulocytes: 0 %
Lymphocytes Absolute: 3.3 x10E3/uL — ABNORMAL HIGH (ref 0.7–3.1)
Lymphs: 34 %
MCH: 26.8 pg (ref 26.6–33.0)
MCHC: 32 g/dL (ref 31.5–35.7)
MCV: 84 fL (ref 79–97)
Monocytes Absolute: 0.7 x10E3/uL (ref 0.1–0.9)
Monocytes: 7 %
Neutrophils Absolute: 5.4 x10E3/uL (ref 1.4–7.0)
Neutrophils: 56 %
Platelets: 383 x10E3/uL (ref 150–450)
RBC: 5.53 x10E6/uL (ref 4.14–5.80)
RDW: 11 % — ABNORMAL LOW (ref 12.3–15.4)
WBC: 9.6 x10E3/uL (ref 3.4–10.8)

## 2018-07-09 LAB — COMPREHENSIVE METABOLIC PANEL WITH GFR
ALT: 30 IU/L (ref 0–44)
AST: 11 IU/L (ref 0–40)
Albumin/Globulin Ratio: 1.7 (ref 1.2–2.2)
Albumin: 4.6 g/dL (ref 3.5–5.5)
Alkaline Phosphatase: 137 IU/L — ABNORMAL HIGH (ref 39–117)
BUN/Creatinine Ratio: 14 (ref 9–20)
BUN: 11 mg/dL (ref 6–24)
Bilirubin Total: 0.5 mg/dL (ref 0.0–1.2)
CO2: 24 mmol/L (ref 20–29)
Calcium: 9.8 mg/dL (ref 8.7–10.2)
Chloride: 94 mmol/L — ABNORMAL LOW (ref 96–106)
Creatinine, Ser: 0.8 mg/dL (ref 0.76–1.27)
GFR calc Af Amer: 121 mL/min/1.73
GFR calc non Af Amer: 105 mL/min/1.73
Globulin, Total: 2.7 g/dL (ref 1.5–4.5)
Glucose: 421 mg/dL — ABNORMAL HIGH (ref 65–99)
Potassium: 4.1 mmol/L (ref 3.5–5.2)
Sodium: 136 mmol/L (ref 134–144)
Total Protein: 7.3 g/dL (ref 6.0–8.5)

## 2018-07-10 ENCOUNTER — Encounter: Payer: Self-pay | Admitting: Cardiovascular Disease

## 2018-07-10 ENCOUNTER — Ambulatory Visit (INDEPENDENT_AMBULATORY_CARE_PROVIDER_SITE_OTHER): Payer: Self-pay | Admitting: Cardiovascular Disease

## 2018-07-10 ENCOUNTER — Telehealth (INDEPENDENT_AMBULATORY_CARE_PROVIDER_SITE_OTHER): Payer: Self-pay

## 2018-07-10 ENCOUNTER — Telehealth: Payer: Self-pay | Admitting: Cardiovascular Disease

## 2018-07-10 VITALS — BP 112/84 | HR 85 | Ht 74.0 in | Wt 224.0 lb

## 2018-07-10 DIAGNOSIS — Z955 Presence of coronary angioplasty implant and graft: Secondary | ICD-10-CM

## 2018-07-10 DIAGNOSIS — I208 Other forms of angina pectoris: Secondary | ICD-10-CM

## 2018-07-10 DIAGNOSIS — I10 Essential (primary) hypertension: Secondary | ICD-10-CM | POA: Insufficient documentation

## 2018-07-10 DIAGNOSIS — I739 Peripheral vascular disease, unspecified: Secondary | ICD-10-CM

## 2018-07-10 DIAGNOSIS — E7800 Pure hypercholesterolemia, unspecified: Secondary | ICD-10-CM

## 2018-07-10 MED ORDER — METOPROLOL SUCCINATE ER 25 MG PO TB24: 25.0000 mg | ORAL_TABLET | Freq: Every day | ORAL | 1 refills | Status: DC

## 2018-07-10 MED FILL — METOPROLOL SUCCINATE ER 25: 25 | 30 days supply | Qty: 30 | Fill #0

## 2018-07-10 NOTE — Telephone Encounter (Signed)
[facility] (Dr. Sherrie MustacheSiez) 07/10/18 vlm

## 2018-07-10 NOTE — Telephone Encounter (Signed)
Patient is aware that CBC is normal. Elevated glucose of 421. Advised patient to increase Lantus as discussed with provider and keep upcoming appointment with clinical pharmacist regarding diabetes and medications. Austin Vaughan, CMA

## 2018-07-10 NOTE — Patient Instructions (Addendum)
Medication Instructions:   Your physician has recommended you make the following change in your medication:   Start metoprolol succinate 25 mg by mouth daily.  Continue all other medications the same.  Labwork:  NONE  Testing/Procedures: Your physician has requested that you have an ankle brachial index (ABI). During this test an ultrasound and blood pressure cuff are used to evaluate the arteries that supply the arms and legs with blood. Allow thirty minutes for this exam. There are no restrictions or special instructions.  Follow-Up:  Your physician recommends that you schedule a follow-up appointment in: 1 month.  Any Other Special Instructions Will Be Listed Below (If Applicable). Your physician has requested that you regularly monitor and record your blood pressure readings at home. Please use the same machine at the same time of day to check your readings and record them to bring to your follow-up visit.  If you need a refill on your cardiac medications before your next appointment, please call your pharmacy.

## 2018-07-10 NOTE — Telephone Encounter (Signed)
-----   Message from Austin Saupeammie Fulp, MD sent at 07/09/2018  6:14 PM EST ----- Notify patient that his CBC was normal.  Patient however had a blood sugar that was elevated at 421 and patient also with alkaline phosphatase increased at 137 with normal of 39-1 17.  Patient should increase his Lantus as discussed as well as keep upcoming appointment with the clinical pharmacist regarding his diabetes and medications

## 2018-07-10 NOTE — Progress Notes (Signed)
Cardiology Office Note   Date:  07/10/2018   ID:  Austin Vaughan, DOB 27-Aug-1970, MRN [id number]  PCP:  Cain Saupe, MD  Cardiologist:   Chilton Si, MD   Chief Complaint  Patient presents with  . New Patient (Initial Visit)    refer by Dr Jillyn Hidden for CAD, pt establishing care     History of Present Illness: Austin Vaughan is a 47 y.o. male with hypertension, diabetes and CAD s/p PCI here today to establish care at the request of Fulp, [name], MD.  He was treated for an MI in 2013 while living in [place], [place].  He presented with bilateral arm and chest pain.  He underwent PCI in an unknown vessel and reports that his LVEF was 47%.  He moved to [place] recently to be closer to his daughter-in-law who recently had a baby.  Just before leaving [place] he had a stress test 04/2018 that was reportedly negative for ischemia.  Since the time of his stenting he is continued to have intermittent chest pain.  The chest pain typically occurs at rest and is associated with mild nausea but no diaphoresis.  It lasts until he gets his nitroglycerin patch.  His cardiologist in [place] recommended that he wear it daily, but he typically only uses it when he has chest pain.  He reports having chest pain several times per week.  He has no lower extremity edema but does report some orthopnea.  He notes that he snores but his wife denies apneic episodes.  He did have a sleep study that was reportedly negative.  Austin Vaughan struggles from peripheral neuropathy.  He has severe pain in his ankles when walking and under the soles of his feet.  He occasionally has pain in his calves.  This occurs both at rest and when walking.  He does not get much exercise because he is afraid it will cause chest pain.   Past Medical History:  Diagnosis Date  . Coronary artery disease   . Diabetes mellitus without complication (HCC)   . Hypertension     History reviewed. No pertinent surgical history.   Current  Outpatient Medications  Medication Sig Dispense Refill  . albuterol (PROAIR HFA) 108 (90 Base) MCG/ACT inhaler Inhale 2 puffs into the lungs every 6 (six) hours as needed for wheezing or shortness of breath.    [name] amLODipine (NORVASC) 10 MG tablet Take 1 tablet (10 mg total) by mouth daily. 8 tablet 0  . atorvastatin (LIPITOR) 40 MG tablet Take 1 tablet (40 mg total) by mouth daily. 30 tablet 5  . dicyclomine (BENTYL) 20 MG tablet Take 20 mg by mouth 4 (four) times daily.    [name] gabapentin (NEURONTIN) 600 MG tablet Take 6 tablets (3,600 mg total) by mouth at bedtime. 240 tablet 5  . glipiZIDE (GLUCOTROL) 10 MG tablet Take 1 tablet (10 mg total) by mouth daily before breakfast. 30 tablet 4  . insulin aspart (NOVOLOG FLEXPEN) 100 UNIT/ML FlexPen Inject 14 Units into the skin 3 (three) times daily with meals.    . Insulin Glargine (LANTUS SOLOSTAR) 100 UNIT/ML Solostar Pen Inject 20 Units into the skin at bedtime. 15 mL 3  . montelukast (SINGULAIR) 10 MG tablet Take 10 mg by mouth daily.    . nitroGLYCERIN (NITRODUR - DOSED IN MG/24 HR) 0.4 mg/hr patch Place 0.4 mg onto the skin daily.    . nitroGLYCERIN (NITROSTAT) 0.4 MG SL tablet Place 0.4 mg under the tongue  every 5 (five) minutes as needed for chest pain.    . nortriptyline (PAMELOR) 50 MG capsule Take 1 capsule (50 mg total) by mouth at bedtime. 30 capsule 5  . ramipril (ALTACE) 10 MG capsule Take 1 capsule (10 mg total) by mouth daily. 8 capsule 0  . metoprolol succinate (TOPROL XL) 25 MG 24 hr tablet Take 1 tablet (25 mg total) by mouth daily. 90 tablet 1   No current facility-administered medications for this visit.     Allergies:   Ambien [zolpidem tartrate] and Shellfish allergy    Social History:  The patient  reports that he has never smoked. He has never used smokeless tobacco. He reports that he drank alcohol. He reports that he does not use drugs.   Family History:  [name] family history includes ALS in his maternal aunt and  mother; Heart disease in his paternal grandfather and paternal grandmother; Lung cancer in his father.    ROS:  Please see the history of present illness.   Otherwise, review of systems are positive for cold hands and feet.   All other systems are reviewed and negative.    PHYSICAL EXAM: VS:  BP 112/84   Pulse 85   Ht 6\' 2"  (1.88 m)   Wt 224 lb (101.6 kg)   BMI 28.76 kg/m  , BMI Body mass index is 28.76 kg/m. GENERAL:  Well appearing HEENT:  Pupils equal round and reactive, fundi not visualized, oral mucosa unremarkable NECK:  No jugular venous distention, waveform within normal limits, carotid upstroke brisk and symmetric, no bruits, no thyromegaly LYMPHATICS:  No cervical adenopathy LUNGS:  Clear to auscultation bilaterally HEART:  RRR.  PMI not displaced or sustained,S1 and S2 within normal limits, no S3, no S4, no clicks, no rubs, no murmurs ABD:  Flat, positive bowel sounds normal in frequency in pitch, no bruits, no rebound, no guarding, no midline pulsatile mass, no hepatomegaly, no splenomegaly EXT:  2 plus pulses throughout, no edema, no cyanosis no clubbing SKIN:  No rashes no nodules NEURO:  Cranial nerves II through XII grossly intact, motor grossly intact throughout PSYCH:  Cognitively intact, oriented to person place and time  EKG:  EKG is ordered today. The ekg ordered today demonstrates sinus rhythm.  Rate 85 bpm.     Recent Labs: 07/08/2018: ALT 30; BUN 11; Creatinine, Ser 0.80; Hemoglobin 14.8; Platelets 383; Potassium 4.1; Sodium 136    Lipid Panel No results found for: CHOL, TRIG, HDL, CHOLHDL, VLDL, LDLCALC, LDLDIRECT    Wt Readings from Last 3 Encounters:  07/10/18 224 lb (101.6 kg)  07/08/18 228 lb (103.4 kg)  06/11/18 226 lb 9.6 oz (102.8 kg)      ASSESSMENT AND PLAN:  # CAD s/p PCI: # Chronic angina: # Hyperlipidemia: Austin Vaughan reports recurrent angina.  However he recently had a stress test before leaving [place].  We will get a copy of these  records.  His symptoms have not changed in the last several years.  I concur with his cardiologist that he should be taking his nitroglycerin patch each day.  He should wear it for 12 hours and take it off for 12 hours.  I also noted that he is not on a beta-blocker.  We will start metoprolol succinate 25 mg daily.  Continue amlodipine, aspirin, and atorvastatin.  # Claudication: It is unclear whether he is having neuropathic pain or claudication.  He does actually have pretty good pulses.  We will check ABIs to rule  out PAD.  # Hypertension:  BP controlled on amlodipine, ramipril.  Adding metoprolol as above.  Will determine if he should still be on amlodipine based on his records.   Current medicines are reviewed at length with the patient today.  The patient does not have concerns regarding medicines.  The following changes have been made:  Add metoprolol   Labs/ tests ordered today include:   Orders Placed This Encounter  Procedures  . EKG 12-Lead     Disposition:   FU with Aidee Latimore C. Duke Salviaandolph, MD, [facility] in 2 months.    Signed, Antonis Lor C. Duke Salviaandolph, MD, [facility]  07/10/2018 1:17 PM    [facility]

## 2018-07-12 MED ORDER — AMLODIPINE BESYLATE 10 MG PO TABS: 10.0000 mg | ORAL_TABLET | Freq: Every day | ORAL | 1 refills | Status: DC

## 2018-07-15 ENCOUNTER — Ambulatory Visit (HOSPITAL_COMMUNITY)
Admission: RE | Admit: 2018-07-15 | Discharge: 2018-07-15 | Disposition: A | Discharge status: Home / Self Care | Payer: Medicare Other | Source: Ambulatory Visit | Attending: Cardiology | Admitting: Cardiology

## 2018-07-15 DIAGNOSIS — I739 Peripheral vascular disease, unspecified: Secondary | ICD-10-CM | POA: Insufficient documentation

## 2018-07-17 ENCOUNTER — Other Ambulatory Visit: Payer: Self-pay | Admitting: Family Medicine

## 2018-07-17 ENCOUNTER — Encounter: Payer: Self-pay | Admitting: Family Medicine

## 2018-07-17 NOTE — Telephone Encounter (Signed)
Patient mychart message.

## 2018-07-17 NOTE — Telephone Encounter (Signed)
Mychart messages should be responded to directly to the patient. This is to build a patient provider relationship outside of the office as well as a direct response and time efficient response to the patients request.

## 2018-07-18 ENCOUNTER — Other Ambulatory Visit: Payer: Self-pay | Admitting: Family Medicine

## 2018-07-18 ENCOUNTER — Telehealth: Payer: Self-pay | Admitting: Family Medicine

## 2018-07-18 DIAGNOSIS — G479 Sleep disorder, unspecified: Secondary | ICD-10-CM

## 2018-07-18 NOTE — Progress Notes (Signed)
Patient ID: Austin Vaughan, male   DOB: 12/04/1970, 47 y.o.   MRN: [id number]   Patient recently called secondary to complaint of issues with sleeping.  Patient with complaint of chronic daytime fatigue, morning headaches and inability to sleep more than 5 hours.  Patient has reportedly had prior sleep study which patient reported was negative however patient symptoms are suggestive of obstructive sleep apnea.  Patient will be scheduled for sleep study due to his current complaints.  Patient will be notified by CMA that he has been scheduled for sleep study and will be contacted by sleep lab

## 2018-07-18 NOTE — Telephone Encounter (Signed)
LVM to Pt informed him that need to contact SS to apply for part B since he already has [art A and at this time he is not qualified for CAFA

## 2018-07-19 MED FILL — RAMIPRIL 5 MG CAPS: 5 | 30 days supply | Qty: 60 | Fill #0

## 2018-07-24 ENCOUNTER — Ambulatory Visit: Payer: Self-pay | Attending: Family Medicine | Admitting: Pharmacist

## 2018-07-24 DIAGNOSIS — I252 Old myocardial infarction: Secondary | ICD-10-CM | POA: Insufficient documentation

## 2018-07-24 DIAGNOSIS — Z794 Long term (current) use of insulin: Secondary | ICD-10-CM

## 2018-07-24 DIAGNOSIS — I2510 Atherosclerotic heart disease of native coronary artery without angina pectoris: Secondary | ICD-10-CM | POA: Insufficient documentation

## 2018-07-24 DIAGNOSIS — Z955 Presence of coronary angioplasty implant and graft: Secondary | ICD-10-CM | POA: Insufficient documentation

## 2018-07-24 DIAGNOSIS — E1142 Type 2 diabetes mellitus with diabetic polyneuropathy: Secondary | ICD-10-CM

## 2018-07-24 LAB — GLUCOSE, POCT (MANUAL RESULT ENTRY): POC Glucose: 313 mg/dL — AB (ref 70–99)

## 2018-07-24 MED ORDER — LIRAGLUTIDE 18 MG/3ML ~~LOC~~ SOPN: PEN_INJECTOR | SUBCUTANEOUS | 0 refills | Status: DC

## 2018-07-24 MED FILL — VICTOZA 18 MG/3 ML INJECT P: 18 | 17 days supply | Qty: 9 | Fill #0

## 2018-07-24 MED FILL — AMLODIPINE BESYLATE 10 MG T: 10 | 30 days supply | Qty: 30 | Fill #0

## 2018-07-24 NOTE — Patient Instructions (Signed)
Thank you for coming to see me today. Please do the following:  1. Increase Lantus to 25 units before bedtime.  2. Continue Novolog 2 times a day.  3. Start Victoza. 4. Stop glimepiride.  5. Continue checking blood sugars at home. It's really important that you record these and bring these in to your next doctor's appointment. If you get in readings above 500 or lower than 70, call me or the clinic to let your doctor know. See below on how to treat low blood sugar.  6. Continue making the lifestyle changes we've discussed together during our visit. Diet and exercise play a significant role in improving your blood sugars.  Follow-up with me in 1 week.   Hypoglycemia or low blood sugar:   Low blood sugar can happen quickly and may become an emergency if not treated right away.   While this shouldn't happen often, it can be brought upon if you skip a meal or do not eat enough. Also, if your insulin or other diabetes medications are dosed too high, this can cause your blood sugar to go to low.   Warning signs of low blood sugar include: 1. Feeling shaky or dizzy 2. Feeling weak or tired  3. Excessive hunger 4. Feeling anxious or upset  5. Sweating even when you aren't exercising  What to do if I experience low blood sugar? 1. Check your blood sugar with your meter. If lower than 70, proceed to step 2.  2. Treat with 3-4 glucose tablets or 3 packets of regular sugar. If these aren't around, you can try hard candy. Yet another option would be to drink 4 ounces of fruit juice or 6 ounces of REGULAR soda.  3. Re-check your sugar in 15 minutes. If it is still below 70, do what you did in step 2 again. If has come back up, go ahead and eat a snack or small meal at this time.

## 2018-07-24 NOTE — Progress Notes (Signed)
    S:    PCP: Dr. Jillyn HiddenFulp   No chief complaint on file.  Patient arrives in good spirits. Presents for diabetes management at the request of Dr. Jillyn HiddenFulp. Patient was referred and last seen 07/08/18.  Family/Social History: CAD (paternal grandparents); never smoker; denies drinking alcohol  Insurance coverage/medication affordability: no prescription drug coverage  Patient denies adherence with medications.  Current diabetes medications include:  - Glipizide 10 mg before breakfast  - Insulin glargine 20 units daily (reports taking 10 units instead of 20)  - Insulin aspart 14 units TID  Patient denies hypoglycemic events.  Patient reported dietary habits:  - High intake of white rice and beans  Patient-reported exercise habits:  - Denies exercise   Patient reports nocturia.  Patient reports neuropathy. Patient reports visual changes. Patient reports self foot exams.   O:  POCT glucose: 313 Home fasting CBG: 300s  2 hour post-prandial/random CBG: 400-500s  Lab Results  Component Value Date   HGBA1C 10.0 (A) 07/08/2018   There were no vitals filed for this visit.  Lipid Panel  No results found for: CHOL, TRIG, HDL, CHOLHDL, VLDL, LDLCALC, LDLDIRECT  Clinical ASCVD: Yes - CAD s/p PCI, MI 2013  A/P: Diabetes longstanding currently uncontrolled. Patient is able to verbalize appropriate hypoglycemia management plan. Patient is adherent with medication. Control is suboptimal due to dietary indiscretion and sedentary lifestyle.   Pt with very high blood sugars at home. He has a significant ASCVD history. Will need combination injection therapy given A1c and home sugars. Will increase his Lantus and add Victoza. I will not adjust his Novolog at this time. If we can achieve goal dose of Victoza, hopefully pt will require less bolus insulin.    -Increased dose of basal insulin Lantus to 25 units daily.  -Continued Novolog 14 units TID. -Started Victoza 0.6 mg daily x1 week, then  1.2 mg daily x1 week, then 1.8 mg daily thereafter. -Stop glipizide. -Extensively discussed pathophysiology of DM, recommended lifestyle interventions, dietary effects on glycemic control -Counseled on s/sx of and management of hypoglycemia -Next A1C anticipated 09/2018.   ASCVD risk - secondary prevention in patient with DM. Patient with no lipid panel results in [facility]. High intensity statin and ASA indicated at this time. Recommend LDL goal < 70.  -Continued aspirin 81 mg  -Continued atorvastatin 40 mg.  -Lipid panel   HM: flu, PNA, and tetanus indicated.  - Pt declined today.  Written patient instructions provided. Total time in face to face counseling 30 minutes.   Follow up Pharmacist Clinic Visit in 1 week.    Butch PennyLuke Van Ausdall, PharmD, CPP Clinical Pharmacist [facility] [phone]

## 2018-07-25 ENCOUNTER — Encounter: Payer: Self-pay | Admitting: Pharmacist

## 2018-07-25 ENCOUNTER — Encounter: Payer: Self-pay | Admitting: Family Medicine

## 2018-07-25 LAB — LIPID PANEL
Chol/HDL Ratio: 3.7 ratio (ref 0.0–5.0)
Cholesterol, Total: 179 mg/dL (ref 100–199)
HDL: 48 mg/dL
LDL Calculated: 111 mg/dL — ABNORMAL HIGH (ref 0–99)
Triglycerides: 100 mg/dL (ref 0–149)
VLDL Cholesterol Cal: 20 mg/dL (ref 5–40)

## 2018-07-25 NOTE — Telephone Encounter (Signed)
Patient mychart concern, please respond back to the patients mychart message directly. Not to the CMA.

## 2018-07-26 ENCOUNTER — Other Ambulatory Visit: Payer: Self-pay

## 2018-07-26 ENCOUNTER — Ambulatory Visit (HOSPITAL_BASED_OUTPATIENT_CLINIC_OR_DEPARTMENT_OTHER): Payer: Medicare Other | Admitting: Physical Medicine & Rehabilitation

## 2018-07-26 ENCOUNTER — Encounter: Payer: Medicare Other | Attending: Physical Medicine & Rehabilitation

## 2018-07-26 ENCOUNTER — Encounter: Payer: Self-pay | Admitting: Physical Medicine & Rehabilitation

## 2018-07-26 VITALS — BP 118/82 | HR 90 | Ht 74.0 in | Wt 228.4 lb

## 2018-07-26 DIAGNOSIS — IMO0001 Reserved for inherently not codable concepts without codable children: Secondary | ICD-10-CM | POA: Insufficient documentation

## 2018-07-26 DIAGNOSIS — E1165 Type 2 diabetes mellitus with hyperglycemia: Secondary | ICD-10-CM | POA: Diagnosis not present

## 2018-07-26 DIAGNOSIS — I208 Other forms of angina pectoris: Secondary | ICD-10-CM

## 2018-07-26 DIAGNOSIS — IMO0002 Reserved for concepts with insufficient information to code with codable children: Secondary | ICD-10-CM

## 2018-07-26 DIAGNOSIS — E1140 Type 2 diabetes mellitus with diabetic neuropathy, unspecified: Secondary | ICD-10-CM | POA: Diagnosis not present

## 2018-07-26 DIAGNOSIS — E118 Type 2 diabetes mellitus with unspecified complications: Secondary | ICD-10-CM

## 2018-07-26 DIAGNOSIS — Z794 Long term (current) use of insulin: Secondary | ICD-10-CM

## 2018-07-26 DIAGNOSIS — E0842 Diabetes mellitus due to underlying condition with diabetic polyneuropathy: Secondary | ICD-10-CM | POA: Insufficient documentation

## 2018-07-26 MED ORDER — PREGABALIN 75 MG PO CAPS: 75.0000 mg | ORAL_CAPSULE | Freq: Every day | ORAL | 0 refills | Status: DC

## 2018-07-26 NOTE — Progress Notes (Signed)
Subjective:    Patient ID: Austin Vaughan, male    DOB: 09/13/1970, 47 y.o.   MRN: [id number]  HPI  Diabetic neuropathy for ~2 yrs ago  Pain affects both hands as well as feet.  He does not feel like it has progressed recently  Pt states was relatively well controlled on high-dose gabapentin taken only at night  ABI were normal ~1wk ago  Non smoker   Walking limited by ankle pain, does not do any exercise  Gabapentin was on 4800mg  per day in [place] now on 3600mg   Also on Nortriptyline 50mg  per day, Pt not sure if this is helping  Numbness and burning pain, denies any lower extremity weakness  Has weakness in hands when opening jars  Spastic colon hx  Poor diabetic control, most recent hemoglobin A1c was 10.  He has not seen a dietitian but thinks he eats pretty well.  He is on insulin as well as oral agents   Pain Inventory Average Pain 10 Pain Right Now 9 My pain is constant, sharp, burning, stabbing, tingling and aching  In the last 24 hours, has pain interfered with the following? General activity 10 Relation with others 10 Enjoyment of life 10 What TIME of day is your pain at its worst? all Sleep (in general) Poor  Pain is worse with: walking, bending, sitting, inactivity and standing Pain improves with: medication Relief from Meds: 1  Mobility walk without assistance use a cane how many minutes can you walk? very few ability to climb steps?  yes do you drive?  no  Function disabled: date disabled 01/2016 I need assistance with the following:  household duties and shopping  Neuro/Psych weakness numbness tingling trouble walking confusion depression  Prior Studies nerve study  Physicians involved in your care Any changes since last visit?  yes Primary care [facility]   Family History  Problem Relation Age of Onset  . ALS Mother   . Lung cancer Father   . ALS Maternal Aunt   . Heart disease Paternal Grandmother   . Heart  disease Paternal Grandfather    Social History   Socioeconomic History  . Marital status: Married    Spouse name: Not on file  . Number of children: Not on file  . Years of education: Not on file  . Highest education level: Not on file  Occupational History  . Not on file  Social Needs  . Financial resource strain: Not on file  . Food insecurity:    Worry: Not on file    Inability: Not on file  . Transportation needs:    Medical: Not on file    Non-medical: Not on file  Tobacco Use  . Smoking status: Never Smoker  . Smokeless tobacco: Never Used  Substance and Sexual Activity  . Alcohol use: Not Currently  . Drug use: Never  . Sexual activity: Yes  Lifestyle  . Physical activity:    Days per week: Not on file    Minutes per session: Not on file  . Stress: Not on file  Relationships  . Social connections:    Talks on phone: Not on file    Gets together: Not on file    Attends religious service: Not on file    Active member of club or organization: Not on file    Attends meetings of clubs or organizations: Not on file    Relationship status: Not on file  Other Topics Concern  . Not  on file  Social History Narrative   Wife and patient relocated to [place] from [place] a few months ago. - 06/11/18   No past surgical history on file. Past Medical History:  Diagnosis Date  . Coronary artery disease   . Diabetes mellitus without complication (HCC)   . Hypertension    BP 118/82   Pulse 90   Ht 6\' 2"  (1.88 m)   Wt 228 lb 6.4 oz (103.6 kg)   SpO2 97%   BMI 29.32 kg/m   Opioid Risk Score:   Fall Risk Score:  `1  Depression screen PHQ 2/9  Depression screen [facility] 2/9 07/26/2018 07/08/2018 06/11/2018  Decreased Interest 2 3 1   Down, Depressed, Hopeless 2 2 1   PHQ - 2 Score 4 5 2   Altered sleeping 3 3 3   Tired, decreased energy 3 3 3   Change in appetite 1 2 0  Feeling bad or failure about yourself  2 2 0  Trouble concentrating 3 3 1   Moving slowly or  fidgety/restless 2 2 1   Suicidal thoughts 0 1 0  PHQ-9 Score 18 21 10   Difficult doing work/chores Very difficult - -     Review of Systems  Constitutional: Positive for appetite change.  Eyes: Negative.   Respiratory: Positive for shortness of breath.   Cardiovascular: Negative.   Gastrointestinal: Positive for abdominal pain and constipation.  Endocrine: Negative.   Genitourinary: Negative.   Musculoskeletal: Negative.   Skin: Negative.   Allergic/Immunologic: Negative.   Neurological: Positive for weakness and numbness.  Hematological: Negative.   Psychiatric/Behavioral: Negative.   All other systems reviewed and are negative.      Objective:   Physical Exam Vitals signs and nursing note reviewed.  Constitutional:      Appearance: Normal appearance.  Neurological:     Mental Status: He is alert.   Right shoulder reduced internal and external rotation Neg impoingement Neck range of motion is normal Motor strength is 5/5 bilateral deltoid bicep tricep, 4- grip, 3- hand intrinsics, 5/5 hip flexors knee extensors ankle dorsiflexor and plantar flexor Sensation is reduced to pinprick in the fingers and absent in the feet ankles and up to the patella bilaterally.  He has intact proprioception at both great toes. Skin shows no evidence of breakdown over the plantar or dorsal surfaces of the feet or toes.  No heel breakdown. Ambulates without assistive device he does have a wider base support and walks tentatively. Low back range of motion is 75% flexion extension lateral bending and rotation. Neck range of motion is full      Assessment & Plan:  1.  Painful diabetic neuropathy we discussed the importance of getting his blood pressures under better control to avoid progression of his neuropathy.  We stated that unfortunately there are no medications that will reverse the effects of the neuropathy.  He did have relatively good relief with very high-dose gabapentin i.e. 4800  mg/day.  We stated that is higher than recommended dose and that it would be recommended to keep his maximum no higher than 3600 mg/day.  This is the current dose that his PCP is prescribing.  We discussed that he can be started on pregabalin and gradually increase this medication as the gabapentin is being reduced Plan is to start gabapentin 3600 nightly with pregabalin 75 mg/day, in 1 month will reduce gabapentin to 3000 mg nightly and increase pregabalin to 150 mg at night, will continue this on a monthly basis and monitor clinical response.  Goal would be to completely eliminate gabapentin and control pain solely with pregabalin would not exceed more than 300 mg at night.  He may need a daytime dose however.  We will continue on nortriptyline for now but may be able to wean that as well depending on response to pregabalin

## 2018-07-26 NOTE — Patient Instructions (Addendum)
Please go to dietician appt and follow recommendations Will start reducing gabapentin next month and increasing lyrica

## 2018-07-30 ENCOUNTER — Telehealth: Payer: Self-pay

## 2018-07-30 NOTE — Telephone Encounter (Signed)
Melissa wife of patient called stating that Lyrica is not affordable and he will have to go through patient assistance program which will take a few months. He is requesting an increase on Gabapentin until he can get Lyrica.

## 2018-07-31 ENCOUNTER — Ambulatory Visit: Payer: Medicare Other | Attending: Family Medicine | Admitting: Pharmacist

## 2018-07-31 DIAGNOSIS — I252 Old myocardial infarction: Secondary | ICD-10-CM | POA: Insufficient documentation

## 2018-07-31 DIAGNOSIS — Z794 Long term (current) use of insulin: Secondary | ICD-10-CM | POA: Diagnosis not present

## 2018-07-31 DIAGNOSIS — E1142 Type 2 diabetes mellitus with diabetic polyneuropathy: Secondary | ICD-10-CM

## 2018-07-31 DIAGNOSIS — Z955 Presence of coronary angioplasty implant and graft: Secondary | ICD-10-CM | POA: Insufficient documentation

## 2018-07-31 DIAGNOSIS — Z23 Encounter for immunization: Secondary | ICD-10-CM

## 2018-07-31 DIAGNOSIS — I2510 Atherosclerotic heart disease of native coronary artery without angina pectoris: Secondary | ICD-10-CM | POA: Insufficient documentation

## 2018-07-31 DIAGNOSIS — E1140 Type 2 diabetes mellitus with diabetic neuropathy, unspecified: Secondary | ICD-10-CM | POA: Insufficient documentation

## 2018-07-31 LAB — GLUCOSE, POCT (MANUAL RESULT ENTRY): POC Glucose: 249 mg/dL — AB (ref 70–99)

## 2018-07-31 MED ORDER — PNEUMOCOCCAL VAC POLYVALENT 25 MCG/0.5ML IJ INJ
0.5000 mL | INJECTION | Freq: Once | INTRAMUSCULAR | Status: AC
  Administered 2018-07-31: 0.5 mL via INTRAMUSCULAR

## 2018-07-31 MED FILL — GABAPENTIN 600 MG TABLET: 600 | 30 days supply | Qty: 180 | Fill #1

## 2018-07-31 NOTE — Patient Instructions (Addendum)
Thank you for coming to see me today. Please do the following:  1. Increase Lantus to 30 units daily. 2. Increase Victoza to 1.2 mg daily.  3. Continue Novolog 14 units 3 times a day before meals. 4. Continue checking blood sugars at home. 5. Continue making the lifestyle changes we've discussed together during our visit. Diet and exercise play a significant role in improving your blood sugars.  6. Follow-up with Dr. Jillyn HiddenFulp in January.   Hypoglycemia or low blood sugar:   Low blood sugar can happen quickly and may become an emergency if not treated right away.   While this shouldn't happen often, it can be brought upon if you skip a meal or do not eat enough. Also, if your insulin or other diabetes medications are dosed too high, this can cause your blood sugar to go to low.   Warning signs of low blood sugar include: 1. Feeling shaky or dizzy 2. Feeling weak or tired  3. Excessive hunger 4. Feeling anxious or upset  5. Sweating even when you aren't exercising  What to do if I experience low blood sugar? 1. Check your blood sugar with your meter. If lower than 70, proceed to step 2.  2. Treat with 3-4 glucose tablets or 3 packets of regular sugar. If these aren't around, you can try hard candy. Yet another option would be to drink 4 ounces of fruit juice or 6 ounces of REGULAR soda.  3. Re-check your sugar in 15 minutes. If it is still below 70, do what you did in step 2 again. If has come back up, go ahead and eat a snack or small meal at this time.

## 2018-07-31 NOTE — Progress Notes (Signed)
    S:    PCP: Dr. Jillyn HiddenFulp   No chief complaint on file.  Patient arrives in good spirits. Presents for diabetes management at the request of Dr. Jillyn HiddenFulp. Patient was referred and last seen 07/08/18. I last saw him on 07/24/18 and started Victoza.  Family/Social History: CAD (paternal grandparents); never smoker; denies drinking alcohol  Insurance coverage/medication affordability: no prescription drug coverage  Patient reports adherence with medications.  Current diabetes medications include:   - Insulin glargine 25 units daily  - Insulin aspart 14 units TID - Victoza 0.6 mg daily  Patient denies hypoglycemic events.  Patient reported dietary habits:  - High intake of white rice and beans  Patient-reported exercise habits:  - Denies exercise   Patient reports polyuria and polydipsia.  Patient reports neuropathy. Patient reports visual changes. Patient reports self foot exams.   O:  POCT glucose: 249 Home fasting CBG: 200s 2 hour post-prandial/random CBG: 200-low 300s  Lab Results  Component Value Date   HGBA1C 10.0 (A) 07/08/2018   There were no vitals filed for this visit.  Lipid Panel     Component Value Date/Time   CHOL 179 07/24/2018 1215   TRIG 100 07/24/2018 1215   HDL 48 07/24/2018 1215   CHOLHDL 3.7 07/24/2018 1215   LDLCALC 111 (H) 07/24/2018 1215    Clinical ASCVD: Yes - CAD s/p PCI, MI 2013  A/P: Diabetes longstanding currently uncontrolled. Patient is able to verbalize appropriate hypoglycemia management plan. Patient is adherent with medication. Control is suboptimal due to dietary indiscretion and sedentary lifestyle.   He continues to have hyperglycemia-associated symptoms. Sugars have improved marginally. I will increase both Lantus and Victoza today.   -Increased dose of basal insulin Lantus to 30 units daily.  -Continued Novolog 14 units TID. -Increased Victoza to 1.2 mg daily. He has been instructed to increase to 1.8 1 week from today if he  is experiencing no hypoglycemia or NV/abdominal pain.  -Extensively discussed pathophysiology of DM, recommended lifestyle interventions, dietary effects on glycemic control -Counseled on s/sx of and management of hypoglycemia -Next A1C anticipated 09/2018.   ASCVD risk - secondary prevention in patient with DM. Patient with no lipid panel results in [facility]. High intensity statin and ASA indicated at this time. Recommend LDL goal < 70. Will follow. He may be a candidate for addition of Zetia in the future to achieve LDL goal.  -Continued aspirin 81 mg  -Continued atorvastatin 40 mg.   HM: flu, PNA, and tetanus indicated.  - Pt declined flu - PNA and tetanus vaccines given  Written patient instructions provided. Total time in face to face counseling 30 minutes.   Follow upw/ PCP in January.  Butch PennyLuke Van Ausdall, PharmD, CPP Clinical Pharmacist [facility] [phone]

## 2018-08-01 ENCOUNTER — Encounter: Payer: Self-pay | Admitting: Pharmacist

## 2018-08-01 NOTE — Telephone Encounter (Signed)
Have they priced generic  pregabalin?

## 2018-08-02 NOTE — Telephone Encounter (Signed)
Looked up pregabalin on GoodRx for the dosage and amount that is prescribed, [facility] for 30 of the 75mg  capsules is $14.28.  Called patient to relay information, no answer, left message to return call.

## 2018-08-09 ENCOUNTER — Encounter

## 2018-08-09 MED FILL — METOPROLOL SUCCINATE ER 25: 25 | 30 days supply | Qty: 30 | Fill #1

## 2018-08-09 MED FILL — ATORVASTATIN CALCIUM 40 MG: 40 | 30 days supply | Qty: 30 | Fill #1

## 2018-08-09 MED FILL — !LANTUS SOLOSTAR 100UNITS/M: 100 | 30 days supply | Qty: 6 | Fill #1

## 2018-08-09 MED FILL — NORTRIPTYLINE HCL 50 MG CAP: 50 | 30 days supply | Qty: 30 | Fill #2

## 2018-08-12 NOTE — Telephone Encounter (Signed)
-----   Message from Cain Saupeammie Fulp, MD sent at 08/09/2018  5:55 PM EST ----- Patient should continue the use of cholesterol medication, atorvastatin as his LDL is 111 and his goal LDL is 70 or less

## 2018-08-12 NOTE — Telephone Encounter (Signed)
Patient verified DOB Patients wife is aware of patient needing to continue with atorvastatin for cholesterol. No further questions.

## 2018-08-19 ENCOUNTER — Ambulatory Visit: Payer: Medicaid Other | Admitting: Family Medicine

## 2018-08-20 ENCOUNTER — Other Ambulatory Visit: Payer: Self-pay

## 2018-08-20 MED ORDER — TETANUS-DIPHTH-ACELL PERTUSSIS 5-2.5-18.5 LF-MCG/0.5 IM SUSP: 0.5000 mL | INTRAMUSCULAR | 0 refills | Status: DC

## 2018-08-22 ENCOUNTER — Encounter: Payer: Self-pay | Admitting: Family Medicine

## 2018-08-22 ENCOUNTER — Ambulatory Visit: Payer: Self-pay | Attending: Family Medicine | Admitting: Family Medicine

## 2018-08-22 VITALS — BP 121/81 | HR 87 | Temp 98.5°F | Resp 18 | Ht 74.0 in | Wt 225.0 lb

## 2018-08-22 DIAGNOSIS — R1030 Lower abdominal pain, unspecified: Secondary | ICD-10-CM

## 2018-08-22 DIAGNOSIS — E1142 Type 2 diabetes mellitus with diabetic polyneuropathy: Secondary | ICD-10-CM

## 2018-08-22 DIAGNOSIS — Z794 Long term (current) use of insulin: Secondary | ICD-10-CM

## 2018-08-22 DIAGNOSIS — R630 Anorexia: Secondary | ICD-10-CM

## 2018-08-22 DIAGNOSIS — R197 Diarrhea, unspecified: Secondary | ICD-10-CM

## 2018-08-22 LAB — GLUCOSE, POCT (MANUAL RESULT ENTRY): POC Glucose: 185 mg/dL — AB (ref 70–99)

## 2018-08-22 MED ORDER — DICYCLOMINE HCL 20 MG PO TABS: 20.0000 mg | ORAL_TABLET | Freq: Four times a day (QID) | ORAL | 0 refills | Status: DC

## 2018-08-22 MED FILL — AMLODIPINE BESYLATE 10 MG T: 10 | 30 days supply | Qty: 30 | Fill #1

## 2018-08-22 MED FILL — RAMIPRIL 5 MG CAPS: 5 | 30 days supply | Qty: 60 | Fill #1

## 2018-08-22 MED FILL — DICYCLOMINE 20 MG TABLET: 20 | 30 days supply | Qty: 120 | Fill #0

## 2018-08-22 NOTE — Progress Notes (Signed)
Subjective:    Patient ID: Austin Vaughan, male    DOB: Aug 13, 1971, 48 y.o.   MRN: [id number]  HPI       48 yo male last seen in the office on 07/08/18.  Patient at that time had recently been seen in the emergency room in October due to an abscess of the left great toe.  Patient also with type 2 diabetes with history of painful diabetic neuropathy.  Patient also with history of heart disease status post stenting and patient has been seen by cardiologist, Dr. Duke Salvia.  Patient has also had vascular studies which showed normal ABIs bilaterally.  Patient had contacted the office with a complaint fatigue and daytime sleepiness and patient was referred for sleep study.  Since his last visit, patient was also seen by the clinical pharmacist for help managing patient's diabetes.  Patient's Victoza was increased to 1.2 mg daily.  Patient was also seen by physical medicine rehabilitation regarding his painful peripheral neuropathy.  Patient was given prescription for Lyrica and patient was to taper gabapentin dose and start Lyrica.      Patient is seen at today's visit secondary to complaint of recent onset of severe lower abdominal pain that is crampy in nature and ranges between an 8-10 on a 0-to-10 scale.  Patient reports that he has had similar pain in the past when he lived back in [place] and had to see a specialist and patient was put on dicyclomine/Bentyl which helped with the pain.  Patient reports that the pain started while on Victoza and patient had increase in pain after recently increasing his dose of Victoza from 1.2-1.8.  Patient and his wife states that they contacted the pharmacist here and they were told to discontinue the medication.  Patient stopped use of Victoza this past Friday.  Patient reports that he continues to have lower abdominal discomfort/pain as well as chronic diarrhea for the past few weeks and patient with loss of appetite.  Patient also states that whenever he has to go to the  restroom at night he also urinates.  Patient denies dysuria.  Patient states that he has not really eating due to a loss of appetite and so his blood sugars have been slightly better.  Patient has increased his insulin to 30 units since he is no longer on the Victoza.  Patient wonders how long the Victoza will remain in his system.  Patient denies any nausea or epigastric pain.  Patient has had no fever or chills.  Patient has had no dark stools and no blood in the stool.  Past Medical History:  Diagnosis Date  . Coronary artery disease   . Diabetes mellitus without complication (HCC)   . Hypertension   Uncontrolled DM with peripheral neuropathy Family History  Problem Relation Age of Onset  . ALS Mother   . Lung cancer Father   . ALS Maternal Aunt   . Heart disease Paternal Grandmother   . Heart disease Paternal Grandfather    Social History   Tobacco Use  . Smoking status: Never Smoker  . Smokeless tobacco: Never Used  Substance Use Topics  . Alcohol use: Not Currently  . Drug use: Never   Allergies  Allergen Reactions  . Ambien [Zolpidem Tartrate] Other (See Comments)    HALLUCINATIONS  . Shellfish Allergy Anaphylaxis    Seafood     Review of Systems  Constitutional: Positive for fatigue. Negative for chills and fever.  Respiratory: Negative for cough and shortness  of breath.   Cardiovascular: Negative for chest pain, palpitations and leg swelling.  Gastrointestinal: Positive for abdominal pain and diarrhea. Negative for blood in stool and constipation.  Genitourinary: Positive for frequency. Negative for dysuria and flank pain.  Musculoskeletal: Positive for gait problem and myalgias.  Neurological: Positive for numbness. Negative for dizziness and headaches.       Objective:   Physical Exam BP 121/81 (BP Location: Left Arm, Patient Position: Sitting, Cuff Size: Normal)   Pulse 87   Temp 98.5 F (36.9 C) (Oral)   Resp 18   Ht 6\' 2"  (1.88 m)   Wt 225 lb (102.1  kg)   SpO2 98%   BMI 28.89 kg/m Nurse's notes and vital signs reviewed General- well-nourished, well-developed adult male in no acute distress sitting on exam table.  Patient is accompanied by his wife who is sitting in exam chair.  Patient is wearing gloves to his hands bilaterally.  Patient has a cane nearby. Lungs-clear to auscultation bilaterally Cardiovascular-regular rate and rhythm Abdomen- normal bowel sounds, soft, no epigastric tenderness, patient with tenderness across the lower abdomen, no rebound or guarding Back-no CVA tenderness Extremities-no edema Psych- slightly flattened affect, interacts appropriately      Assessment & Plan:  1. Lower abdominal pain Patient with complaint of crampy lower abdominal pain.  Will check CBC, CMP and urinalysis to look for possible inflammation/infection, electrolyte abnormality or urinary tract infection which may be contributing to his abdominal pain.  Patient reports that he has had similar abdominal pain in the past which responded to use of Bentyl therefore this medication has been refilled at today's visit.  Patient and wife also wondered how long it might take for this medication to get out of the patient system and I spoke with the clinical pharmacist who stated that it might take 1 to 2 weeks for the medication to get out of the patient's system and I relayed this to the patient and his wife.  Patient was encouraged to follow-up in the next 2 weeks if he continues to have lower abdominal pain, diarrhea, loss of appetite or any concerns - POCT URINALYSIS DIP (CLINITEK) - dicyclomine (BENTYL) 20 MG tablet; Take 1 tablet (20 mg total) by mouth 4 (four) times daily. As needed for abdominal cramping  Dispense: 120 tablet; Refill: 0 - CBC with Differential - Comprehensive metabolic panel  2. Type 2 diabetes mellitus with diabetic polyneuropathy, with long-term current use of insulin (HCC) Patient states that he was unable to tolerate use of  Victoza secondary to onset of abdominal pain.  He denies nausea or upper abdominal pain/epigastric area pain which would usually be associated with the use of this class of medications but will check lipase to make sure that there is been no rise in lipase associated with his use of Victoza.  Patient will also have CMP.  Patient's blood sugar at today's visit per patient was 186.  Patient will also have urinalysis and follow-up of his diabetes as well as lower abdominal pain - POCT URINALYSIS DIP (CLINITEK) - Ambulatory referral to Social Work - Comprehensive metabolic panel - Lipase - Glucose (CBG)  3.  Loss of appetite Patient reports loss of appetite.  Hopefully this will recover once the Victoza has gotten out of his system but patient should call or return if he does not feel that his appetite has returned within the next 2 weeks.  Patient will have labs done in follow-up including CBC, CMP and lipase.  Patient needs  to make sure that he is monitoring his blood sugars and adjusting insulin since he is not eating very much at this time per his report - CBC with Differential - Comprehensive metabolic panel - Lipase  4. Diarrhea, unspecified type Patient with diarrhea and will have CBC, CMP and lipase in follow-up.  Patient may take over-the-counter Imodium if needed - CBC with Differential - Comprehensive metabolic panel - Lipase  An After Visit Summary was printed and given to the patient.  Return in about 4 weeks (around 09/19/2018) for DM/ABD Pain.

## 2018-08-23 LAB — CBC WITH DIFFERENTIAL/PLATELET
Basophils Absolute: 0.1 x10E3/uL (ref 0.0–0.2)
Basos: 1 %
EOS (ABSOLUTE): 0.2 x10E3/uL (ref 0.0–0.4)
Eos: 3 %
Hematocrit: 40.5 % (ref 37.5–51.0)
Hemoglobin: 13.5 g/dL (ref 13.0–17.7)
Immature Grans (Abs): 0 x10E3/uL (ref 0.0–0.1)
Immature Granulocytes: 0 %
Lymphocytes Absolute: 2.6 x10E3/uL (ref 0.7–3.1)
Lymphs: 40 %
MCH: 26.9 pg (ref 26.6–33.0)
MCHC: 33.3 g/dL (ref 31.5–35.7)
MCV: 81 fL (ref 79–97)
Monocytes Absolute: 0.9 x10E3/uL (ref 0.1–0.9)
Monocytes: 14 %
Neutrophils Absolute: 2.7 x10E3/uL (ref 1.4–7.0)
Neutrophils: 42 %
Platelets: 355 x10E3/uL (ref 150–450)
RBC: 5.01 x10E6/uL (ref 4.14–5.80)
RDW: 11.4 % — ABNORMAL LOW (ref 11.6–15.4)
WBC: 6.5 x10E3/uL (ref 3.4–10.8)

## 2018-08-23 LAB — LIPASE: Lipase: 9 U/L — ABNORMAL LOW (ref 13–78)

## 2018-08-23 LAB — COMPREHENSIVE METABOLIC PANEL WITH GFR
ALT: 25 IU/L (ref 0–44)
AST: 12 IU/L (ref 0–40)
Albumin/Globulin Ratio: 1.8 (ref 1.2–2.2)
Albumin: 4.4 g/dL (ref 3.5–5.5)
Alkaline Phosphatase: 149 IU/L — ABNORMAL HIGH (ref 39–117)
BUN/Creatinine Ratio: 10 (ref 9–20)
BUN: 7 mg/dL (ref 6–24)
Bilirubin Total: 0.5 mg/dL (ref 0.0–1.2)
CO2: 25 mmol/L (ref 20–29)
Calcium: 9.3 mg/dL (ref 8.7–10.2)
Chloride: 98 mmol/L (ref 96–106)
Creatinine, Ser: 0.67 mg/dL — ABNORMAL LOW (ref 0.76–1.27)
GFR calc Af Amer: 130 mL/min/1.73
GFR calc non Af Amer: 113 mL/min/1.73
Globulin, Total: 2.5 g/dL (ref 1.5–4.5)
Glucose: 197 mg/dL — ABNORMAL HIGH (ref 65–99)
Potassium: 3.9 mmol/L (ref 3.5–5.2)
Sodium: 138 mmol/L (ref 134–144)
Total Protein: 6.9 g/dL (ref 6.0–8.5)

## 2018-08-30 ENCOUNTER — Encounter: Payer: Self-pay | Attending: Physical Medicine & Rehabilitation | Admitting: Registered Nurse

## 2018-09-03 ENCOUNTER — Ambulatory Visit: Payer: Medicare Other | Admitting: *Deleted

## 2018-09-03 ENCOUNTER — Telehealth: Payer: Self-pay | Admitting: *Deleted

## 2018-09-03 MED FILL — GABAPENTIN 600 MG TABLET: 600 | 30 days supply | Qty: 180 | Fill #2

## 2018-09-03 MED FILL — !LANTUS SOLOSTAR 100UNITS/M: 100 | 30 days supply | Qty: 6 | Fill #2

## 2018-09-03 MED FILL — NORTRIPTYLINE HCL 50 MG CAP: 50 | 30 days supply | Qty: 30 | Fill #3

## 2018-09-03 NOTE — Telephone Encounter (Signed)
-----   Message from Cain Saupe, MD sent at 08/23/2018 12:53 PM EST ----- Notify patient of normal CBC, no elevation in lipase (pancreatic enzyme) and CMP showed a glucose of 197 and a slight increase in alkaline phosphatase which is a non-specific liver enzyme.

## 2018-09-03 NOTE — Telephone Encounter (Signed)
Patient verified DOB Patient is aware of labs being normal and a recheck being completed on liver enzyme and glucose in the blood. No further questions.

## 2018-09-04 ENCOUNTER — Ambulatory Visit: Payer: Self-pay | Admitting: Cardiovascular Disease

## 2018-09-09 ENCOUNTER — Telehealth: Payer: Self-pay | Admitting: Licensed Clinical Social Worker

## 2018-09-09 NOTE — Telephone Encounter (Signed)
Call placed to patient to follow up on consult to address abnormal phq9 scores. LCSWA left a message requesting a return call.

## 2018-09-11 ENCOUNTER — Encounter (HOSPITAL_BASED_OUTPATIENT_CLINIC_OR_DEPARTMENT_OTHER): Payer: Medicare Other

## 2018-09-12 ENCOUNTER — Encounter: Payer: Self-pay | Admitting: Family Medicine

## 2018-09-13 ENCOUNTER — Telehealth: Payer: Self-pay | Admitting: Licensed Clinical Social Worker

## 2018-09-13 NOTE — Telephone Encounter (Signed)
Call placed to patient to follow up on consult to address abnormal phq9 scores. LCSWA left a message requesting a return call.  

## 2018-09-16 MED FILL — ATORVASTATIN CALCIUM 40 MG: 40 | 30 days supply | Qty: 30 | Fill #2

## 2018-09-16 MED FILL — METOPROLOL SUCCINATE ER 25: 25 | 30 days supply | Qty: 30 | Fill #2

## 2018-09-17 ENCOUNTER — Ambulatory Visit (INDEPENDENT_AMBULATORY_CARE_PROVIDER_SITE_OTHER): Payer: Self-pay | Admitting: Cardiology

## 2018-09-17 ENCOUNTER — Encounter: Payer: Self-pay | Admitting: Cardiology

## 2018-09-17 VITALS — BP 110/84 | HR 99 | Ht 74.0 in | Wt 225.0 lb

## 2018-09-17 DIAGNOSIS — E118 Type 2 diabetes mellitus with unspecified complications: Secondary | ICD-10-CM

## 2018-09-17 DIAGNOSIS — I2510 Atherosclerotic heart disease of native coronary artery without angina pectoris: Secondary | ICD-10-CM | POA: Insufficient documentation

## 2018-09-17 DIAGNOSIS — E785 Hyperlipidemia, unspecified: Secondary | ICD-10-CM

## 2018-09-17 DIAGNOSIS — IMO0001 Reserved for inherently not codable concepts without codable children: Secondary | ICD-10-CM

## 2018-09-17 DIAGNOSIS — I10 Essential (primary) hypertension: Secondary | ICD-10-CM

## 2018-09-17 DIAGNOSIS — Z9861 Coronary angioplasty status: Secondary | ICD-10-CM

## 2018-09-17 DIAGNOSIS — Z794 Long term (current) use of insulin: Secondary | ICD-10-CM

## 2018-09-17 DIAGNOSIS — R002 Palpitations: Secondary | ICD-10-CM | POA: Insufficient documentation

## 2018-09-17 MED ORDER — NITROGLYCERIN 0.4 MG/HR TD PT24: 0.4000 mg | MEDICATED_PATCH | Freq: Every day | TRANSDERMAL | 6 refills | Status: DC

## 2018-09-17 MED FILL — NITROGLYCERIN 0.4 MG/HR PTC: 0.4 | 30 days supply | Qty: 30 | Fill #0

## 2018-09-17 NOTE — Assessment & Plan Note (Signed)
Pt has neuropathy

## 2018-09-17 NOTE — Progress Notes (Signed)
09/17/2018 Austin Vaughan   01/06/1971  [id number]  Primary Physician Cain SaupeFulp, [name], MD Primary Cardiologist: Duke Salviaandolph  HPI: Patient is a 48 year old male who was referred to Dr. Duke Salviaandolph to be established in November 2019.  He has a history of prior MI while living in [place].  This was in 2013.  He apparently had a PCI, the vessel is unknown.  He moved to [place] to be closer to his daughter-in-law.  In September 2019 before leaving [place] he says he had a stress test that was negative for ischemia.  When he saw Dr. Duke Salviaandolph in November he had complained of some chest pain although it was not clear this was anginal.  The patient had been wearing his nitroglycerin patch intermittently.  Dr. Duke Salviaandolph suggested he take this daily and also added Toprol 25 mg.  The patient is in the office today with complaints of palpitations.  This is been going on for 3 to 4 weeks.  He has occasional skipped beats and also short runs of tachycardia that lasted about a minute.  Sometimes he feels faint with these episodes.  He has some associated nausea but no diaphoresis.  He is not had true syncope.  He is still not taking his nitro Dur patch daily, he says he was going to run out so he split it every other day.   Current Outpatient Medications  Medication Sig Dispense Refill  . albuterol (PROAIR HFA) 108 (90 Base) MCG/ACT inhaler Inhale 2 puffs into the lungs every 6 (six) hours as needed for wheezing or shortness of breath.    [name]. amLODipine (NORVASC) 10 MG tablet Take 1 tablet (10 mg total) by mouth daily. To lower blood pressure 30 tablet 1  . aspirin EC 81 MG tablet Take 81 mg by mouth daily.    [name]. atorvastatin (LIPITOR) 40 MG tablet Take 1 tablet (40 mg total) by mouth daily. 30 tablet 5  . dicyclomine (BENTYL) 20 MG tablet Take 1 tablet (20 mg total) by mouth 4 (four) times daily. As needed for abdominal cramping 120 tablet 0  . gabapentin (NEURONTIN) 600 MG tablet Take 6 tablets (3,600 mg  total) by mouth at bedtime. 240 tablet 5  . glipiZIDE (GLUCOTROL) 10 MG tablet Take 1 tablet (10 mg total) by mouth daily before breakfast. 30 tablet 4  . insulin aspart (NOVOLOG FLEXPEN) 100 UNIT/ML FlexPen Inject 14 Units into the skin 3 (three) times daily with meals.    . Insulin Glargine (LANTUS SOLOSTAR) 100 UNIT/ML Solostar Pen Inject 20 Units into the skin at bedtime. 15 mL 3  . metoprolol succinate (TOPROL XL) 25 MG 24 hr tablet Take 1 tablet (25 mg total) by mouth daily. 90 tablet 1  . montelukast (SINGULAIR) 10 MG tablet Take 10 mg by mouth daily.    . nitroGLYCERIN (NITRODUR - DOSED IN MG/24 HR) 0.4 mg/hr patch Place 1 patch (0.4 mg total) onto the skin daily. 30 patch 6  . nitroGLYCERIN (NITROSTAT) 0.4 MG SL tablet Place 0.4 mg under the tongue every 5 (five) minutes as needed for chest pain.    . nortriptyline (PAMELOR) 50 MG capsule Take 1 capsule (50 mg total) by mouth at bedtime. 30 capsule 5  . ramipril (ALTACE) 5 MG capsule TAKE 2 CAPSULES BY MOUTH DAILY. 60 capsule 3   No current facility-administered medications for this visit.     Allergies  Allergen Reactions  . Ambien [Zolpidem Tartrate] Other (See Comments)    HALLUCINATIONS  .  Shellfish Allergy Anaphylaxis    Seafood    Past Medical History:  Diagnosis Date  . Coronary artery disease   . Diabetes mellitus without complication (HCC)   . Hypertension     Social History   Socioeconomic History  . Marital status: Married    Spouse name: Not on file  . Number of children: Not on file  . Years of education: Not on file  . Highest education level: Not on file  Occupational History  . Not on file  Social Needs  . Financial resource strain: Not on file  . Food insecurity:    Worry: Not on file    Inability: Not on file  . Transportation needs:    Medical: Not on file    Non-medical: Not on file  Tobacco Use  . Smoking status: Never Smoker  . Smokeless tobacco: Never Used  Substance and Sexual  Activity  . Alcohol use: Not Currently  . Drug use: Never  . Sexual activity: Yes  Lifestyle  . Physical activity:    Days per week: Not on file    Minutes per session: Not on file  . Stress: Not on file  Relationships  . Social connections:    Talks on phone: Not on file    Gets together: Not on file    Attends religious service: Not on file    Active member of club or organization: Not on file    Attends meetings of clubs or organizations: Not on file    Relationship status: Not on file  . Intimate partner violence:    Fear of current or ex partner: Not on file    Emotionally abused: Not on file    Physically abused: Not on file    Forced sexual activity: Not on file  Other Topics Concern  . Not on file  Social History Narrative   Wife and patient relocated to [place] from [place] a few months ago. - 06/11/18     Family History  Problem Relation Age of Onset  . ALS Mother   . Lung cancer Father   . ALS Maternal Aunt   . Heart disease Paternal Grandmother   . Heart disease Paternal Grandfather      Review of Systems: General: negative for chills, fever, night sweats or weight changes.  Cardiovascular: negative for chest pain, dyspnea on exertion, edema, orthopnea, paroxysmal nocturnal dyspnea or shortness of breath Dermatological: negative for rash Respiratory: negative for cough or wheezing Urologic: negative for hematuria Abdominal: negative for nausea, vomiting, diarrhea, bright red blood per rectum, melena, or hematemesis Neurologic: negative for visual changes, syncope, or dizziness All other systems reviewed and are otherwise negative except as noted above.    Blood pressure 110/84, pulse 99, height 6\' 2"  (1.88 m), weight 225 lb (102.1 kg).  General appearance: alert, cooperative and no distress Neck: no JVD Lungs: clear to auscultation bilaterally Heart: regular rate and rhythm Extremities: no LE edema Skin: Skin color, texture, turgor normal. No  rashes or lesions Neurologic: Grossly normal  EKG NSR, HR 88  ASSESSMENT AND PLAN:   Palpitations Seen in the office today with complaints of palpitations and near syncope for the past month  Insulin dependent diabetes mellitus with complications (HCC) Pt has neuropathy  Dyslipidemia, goal LDL below 70 On Lipitor 40 mg  CAD S/P percutaneous coronary angioplasty S/P MI- PCI of unknown vessel in Fl 2013 Reportedly Myoview low risk Sept 2019 before moving to [facility]  HTN- controlled  PLAN  Check 14 day ZIO, f/u after this.  Refill Nitro Dur patch and take daily.   Corine ShelterLuke Quinci Gavidia PA-C 09/17/2018 4:00 PM

## 2018-09-17 NOTE — Assessment & Plan Note (Signed)
On Lipitor 40 mg 

## 2018-09-17 NOTE — Patient Instructions (Signed)
Medication Instructions:  Your physician recommends that you continue on your current medications as directed. Please refer to the Current Medication list given to you today.  If you need a refill on your cardiac medications before your next appointment, please call your pharmacy.   Lab work: None  If you have labs (blood work) drawn today and your tests are completely normal, you will receive your results only by: [name] MyChart Message (if you have MyChart) OR . A paper copy in the mail If you have any lab test that is abnormal or we need to change your treatment, we will call you to review the results.  Testing/Procedures: Your physician has recommended that you wear an 14 DAY ZIO monitor. Event monitors are medical devices that record the heart's electrical activity. Doctors most often [place] these monitors to diagnose arrhythmias. Arrhythmias are problems with the speed or rhythm of the heartbeat. The monitor is a small, portable device. You can wear one while you do your normal daily activities. This is usually used to diagnose what is causing palpitations/syncope (passing out). [address]  Follow-Up: At [facility], you and your health needs are our priority.  As part of our continuing mission to provide you with exceptional heart care, we have created designated Provider Care Teams.  These Care Teams include your primary Cardiologist (physician) and Advanced Practice Providers (APPs -  Physician Assistants and Nurse Practitioners) who all work together to provide you with the care you need, when you need it.  . Your physician recommends that you schedule a follow-up appointment in: 3-4 weeks with Corine Shelter, PA-C  Any Other Special Instructions Will Be Listed Below (If Applicable).

## 2018-09-17 NOTE — Assessment & Plan Note (Signed)
Seen in the office today with complaints of palpitations and near syncope for the past month

## 2018-09-17 NOTE — Assessment & Plan Note (Signed)
S/P MI- PCI of unknown vessel in Fl 2013 Reportedly Myoview low risk Sept 2019 before moving to [facility]

## 2018-09-19 ENCOUNTER — Encounter: Payer: Self-pay | Admitting: Family Medicine

## 2018-09-19 ENCOUNTER — Ambulatory Visit: Payer: Medicare Other | Attending: Family Medicine | Admitting: Family Medicine

## 2018-09-19 VITALS — BP 117/79 | HR 105 | Temp 98.6°F | Resp 18 | Ht 74.0 in | Wt 229.0 lb

## 2018-09-19 DIAGNOSIS — E1142 Type 2 diabetes mellitus with diabetic polyneuropathy: Secondary | ICD-10-CM

## 2018-09-19 DIAGNOSIS — E1165 Type 2 diabetes mellitus with hyperglycemia: Secondary | ICD-10-CM

## 2018-09-19 DIAGNOSIS — Z794 Long term (current) use of insulin: Secondary | ICD-10-CM

## 2018-09-19 LAB — POCT URINALYSIS DIP (CLINITEK)
Bilirubin, UA: NEGATIVE
Blood, UA: NEGATIVE
Glucose, UA: 500 mg/dL — AB
Ketones, POC UA: NEGATIVE mg/dL
Leukocytes, UA: NEGATIVE
Nitrite, UA: NEGATIVE
POC PROTEIN,UA: NEGATIVE
Spec Grav, UA: <=1.005 — AB
Urobilinogen, UA: 0.2 U/dL
pH, UA: 5.5

## 2018-09-19 LAB — POCT GLYCOSYLATED HEMOGLOBIN (HGB A1C): Hemoglobin A1C: 9.5 % — AB (ref 4.0–5.6)

## 2018-09-19 LAB — GLUCOSE, POCT (MANUAL RESULT ENTRY): POC Glucose: 366 mg/dL — AB (ref 70–99)

## 2018-09-19 MED ORDER — INSULIN GLARGINE 100 UNIT/ML SOLOSTAR PEN: 35.0000 [IU] | PEN_INJECTOR | Freq: Every day | SUBCUTANEOUS | 3 refills | Status: DC

## 2018-09-19 MED FILL — glipiZIDE 10 MG TABS: 10 | 30 days supply | Qty: 30 | Fill #1

## 2018-09-19 MED FILL — RAMIPRIL 5 MG CAPS: 5 | 30 days supply | Qty: 60 | Fill #2

## 2018-09-19 NOTE — Progress Notes (Signed)
Subjective:    Patient ID: Austin Vaughan, male    DOB: 04/26/1971, 48 y.o.   MRN: [id number]  HPI       48 yo male who is seen in follow-up of diabetes with insulin dependence.  Patient also with peripheral neuropathy and neuropathic pain.  Patient reports that he does have an appointment tomorrow with neurology regarding his neuropathy.  He reports that he is now taking 30 units of Lantus nightly and is taking 14 units of NovoLog before meals.  Patient however states that he is not eating as much as in the past and sometimes skips meals.  Patient reports that he does not have much of an appetite which he attributes to pain and poor sleep related to his peripheral neuropathy.  Patient reports that his blood sugars are remaining in the 200s, usually around 260 fasting.  Patient reports fatigue, increased thirst and sometimes urinary frequency.  Patient reports that his abdominal pain that was occurring at his last visit has improved since his last visit since restarting use of Bentyl.  Patient however states that when he gets abdominal pain he takes 4 of the Bentyl at the same time but then he often develops constipation for a few days.  Past Medical History:  Diagnosis Date  . Coronary artery disease   . Diabetes mellitus without complication (HCC)   . Hypertension    Family History  Problem Relation Age of Onset  . ALS Mother   . Lung cancer Father   . ALS Maternal Aunt   . Heart disease Paternal Grandmother   . Heart disease Paternal Grandfather    Social History   Tobacco Use  . Smoking status: Never Smoker  . Smokeless tobacco: Never Used  Substance Use Topics  . Alcohol use: Not Currently  . Drug use: Never   Allergies  Allergen Reactions  . Ambien [Zolpidem Tartrate] Other (See Comments)    HALLUCINATIONS  . Shellfish Allergy Anaphylaxis    Seafood     Review of Systems  Constitutional: Positive for fatigue. Negative for chills and fever.  HENT: Negative for sore throat  and trouble swallowing.   Respiratory: Negative for cough and shortness of breath.   Cardiovascular: Negative for chest pain, palpitations and leg swelling.  Gastrointestinal: Positive for abdominal pain, constipation and diarrhea. Negative for nausea.  Endocrine: Positive for polydipsia. Negative for polyphagia and polyuria.  Genitourinary: Negative for dysuria and frequency.  Musculoskeletal: Positive for arthralgias and gait problem. Negative for back pain.  Neurological: Positive for numbness. Negative for dizziness and headaches.       Objective:   Physical Exam BP 117/79 (BP Location: Right Arm, Patient Position: Sitting, Cuff Size: Normal)   Pulse (!) 105   Temp 98.6 F (37 C) (Oral)   Resp 18   Ht 6\' 2"  (1.88 m)   Wt 229 lb (103.9 kg)   SpO2 96%   BMI 29.40 kg/m Nurse's notes and vital signs reviewed General- well-nourished well-developed overweight male in no acute distress sitting on exam table.  Patient is accompanied by his wife at today's visit Lungs-clear to auscultation bilaterally Cardiovascular-regular rate and rhythm Abdomen-soft, nontender; normal bowel sounds Back-no CVA tenderness Extremities-no edema      Assessment & Plan:  1. Type 2 diabetes mellitus with diabetic polyneuropathy, with long-term current use of insulin (HCC) Patient reports that his fasting blood sugars are remaining in the 200s.  Patient is currently on Lantus 30 units nightly and regular insulin 14  units pre-meal but patient states that he is often skipping meals or not eating very much per meal.  Patient was asked to increase his Lantus to 35 units daily and continue to monitor his blood sugars.  If his fasting glucose remains greater than 160, patient is encouraged to increase his Lantus by 2 units every 5 days until his fasting blood sugars are consistently 160 or less and will later tighten goal of 140 or less and eventually 120 or less.  Patient was asked to follow-up with the clinical  pharmacist in the next 2 weeks regarding his blood sugars.  Patient with glucose level of 366 at today's visit and hemoglobin A1c of 9.5 however hemoglobin A1c was only done 2 months ago and was 10.0 on 07/08/2018. - HgB A1c - Glucose (CBG) - Insulin Glargine (LANTUS SOLOSTAR) 100 UNIT/ML Solostar Pen; Inject 35 Units into the skin daily.  Dispense: 15 mL; Refill: 3  An After Visit Summary was printed and given to the patient.  Allergies as of 09/19/2018      Reactions   Ambien [zolpidem Tartrate] Other (See Comments)   HALLUCINATIONS   Shellfish Allergy Anaphylaxis   Seafood      Medication List       Accurate as of September 19, 2018 10:41 PM. Always use your most recent med list.        amLODipine 10 MG tablet Commonly known as:  NORVASC Take 1 tablet (10 mg total) by mouth daily. To lower blood pressure   aspirin EC 81 MG tablet Take 81 mg by mouth daily.   atorvastatin 40 MG tablet Commonly known as:  LIPITOR Take 1 tablet (40 mg total) by mouth daily.   dicyclomine 20 MG tablet Commonly known as:  BENTYL Take 1 tablet (20 mg total) by mouth 4 (four) times daily. As needed for abdominal cramping   gabapentin 600 MG tablet Commonly known as:  NEURONTIN Take 6 tablets (3,600 mg total) by mouth at bedtime.   glipiZIDE 10 MG tablet Commonly known as:  GLUCOTROL Take 1 tablet (10 mg total) by mouth daily before breakfast.   Insulin Glargine 100 UNIT/ML Solostar Pen Commonly known as:  LANTUS SOLOSTAR Inject 35 Units into the skin daily.   metoprolol succinate 25 MG 24 hr tablet Commonly known as:  TOPROL XL Take 1 tablet (25 mg total) by mouth daily.   montelukast 10 MG tablet Commonly known as:  SINGULAIR Take 10 mg by mouth daily.   nitroGLYCERIN 0.4 MG SL tablet Commonly known as:  NITROSTAT Place 0.4 mg under the tongue every 5 (five) minutes as needed for chest pain.   nitroGLYCERIN 0.4 mg/hr patch Commonly known as:  NITRODUR - Dosed in mg/24 hr Place  1 patch (0.4 mg total) onto the skin daily.   nortriptyline 50 MG capsule Commonly known as:  PAMELOR Take 1 capsule (50 mg total) by mouth at bedtime.   NOVOLOG FLEXPEN 100 UNIT/ML FlexPen Generic drug:  insulin aspart Inject 14 Units into the skin 3 (three) times daily with meals.   PROAIR HFA 108 (90 Base) MCG/ACT inhaler Generic drug:  albuterol Inhale 2 puffs into the lungs every 6 (six) hours as needed for wheezing or shortness of breath.   ramipril 5 MG capsule Commonly known as:  ALTACE TAKE 2 CAPSULES BY MOUTH DAILY.      Return in about 2 months (around 11/18/2018) for DM-2 weeks with LUKE/CPP;.

## 2018-09-19 NOTE — Patient Instructions (Signed)
Please increase your Lantus dose from your current 30 units daily up to 35 units daily. If your fasting blood sugars are still greater than 160 then please increase your Lantus dose by 2 units every 5 days until fasting blood sugars are consistently 160 or less

## 2018-09-19 NOTE — Progress Notes (Signed)
[facility] Neurology Division Clinic Note - Initial Visit   Date: 09/20/18  Austin Vaughan MRN: [id number] DOB: 1971/01/14   Dear Dr. Jillyn Hidden:  Thank you for your kind referral of Austin Vaughan for consultation of diabetic neuropathy. Although his history is well known to you, please allow [place] to reiterate it for the purpose of our medical record. The patient was accompanied to the clinic by wife who also provides collateral information.     History of Present Illness: Austin Vaughan is a 48 y.o. right-handed Hispanic male with CAD s/p PCI, hypertension, poorly-controlled insulin-dependent diabetes mellitis, hyperlipidemia, and bilateral ulnar neuropathy  presenting for evaluation of diabetic neuropathy.    He had heart attack in 2013 and was diagnosed with diabetes at the same time.  He was not getting medical care prior to this.  Soon after this, he began experiencing numbness, tingling, and pain in the feet which has gradually moved up his lower legs and into the hands. He was diagnosed with neuropathy around 2017 by Dr. Jeronimo Norma at [facility] in [place], [place].  He underwent NCS/EMG of the arms and legs, which per patient, indicated he has neuropathy and bilateral ulnar neuropathy.  He did seek the opinion of ulnar nerve transposition and was told that he would not have any benefit.    Currently, he continues to have numbness/tingling and sharp pain of the lower legs and hands.  He has imbalance and uses a cane for the past 1-2 years.  He has fallen twice over the past year.  He is usually able to stand up by himself.  He has weakness of the hands and cannot open jars/bottles.  He drops things frequently.  He has mild weakness of the legs.  He has not done any physical therapy.   He was taking gabapentin 600 mg 8-10 times daily in [place] and established care with Dr. Wynn Banker in PM&R for chronic pain associated with diabetic neuropathy.  He is on gabapentin 3600mg  at bedtime and  nortriptyline 50mg  at bedtime.  I informed patient maximal gabapentin dose is 3600mg /d.   He has strong family history of ALS in mother, maternal aunts x 4 (3 brothers unaffected), male cousins x 4.  He has one brother that is unaffected.  His mother died at 80.  He denies any difficulty swallowing, muscle twitches.  He has occasional slurred speech and apparently had a MRI brain in [place].  I will request his records to review.    Out-side paper records, electronic medical record, and images have been reviewed where available and summarized as:  Lab Results  Component Value Date   HGBA1C 9.5 (A) 09/19/2018     Past Medical History:  Diagnosis Date  . Coronary artery disease   . Diabetes mellitus without complication (HCC)   . Hypertension     Past Surgical History:  Procedure Laterality Date  . CORONARY ANGIOPLASTY WITH STENT PLACEMENT  07/2002     Medications:  Outpatient Encounter Medications as of 09/20/2018  Medication Sig  . albuterol (PROAIR HFA) 108 (90 Base) MCG/ACT inhaler Inhale 2 puffs into the lungs every 6 (six) hours as needed for wheezing or shortness of breath.  [name] amLODipine (NORVASC) 10 MG tablet Take 1 tablet (10 mg total) by mouth daily. To lower blood pressure  . aspirin EC 81 MG tablet Take 81 mg by mouth daily.  [name] atorvastatin (LIPITOR) 40 MG tablet Take 1 tablet (40 mg total) by mouth daily.  [name] dicyclomine (  BENTYL) 20 MG tablet Take 1 tablet (20 mg total) by mouth 4 (four) times daily. As needed for abdominal cramping  . gabapentin (NEURONTIN) 600 MG tablet Take 6 tablets (3,600 mg total) by mouth at bedtime.  [name] glipiZIDE (GLUCOTROL) 10 MG tablet Take 1 tablet (10 mg total) by mouth daily before breakfast.  . insulin aspart (NOVOLOG FLEXPEN) 100 UNIT/ML FlexPen Inject 14 Units into the skin 3 (three) times daily with meals.  . Insulin Glargine (LANTUS SOLOSTAR) 100 UNIT/ML Solostar Pen Inject 35 Units into the skin daily.  . metoprolol succinate (TOPROL  XL) 25 MG 24 hr tablet Take 1 tablet (25 mg total) by mouth daily.  . montelukast (SINGULAIR) 10 MG tablet Take 10 mg by mouth daily.  . nortriptyline (PAMELOR) 50 MG capsule Take 1 capsule (50 mg total) by mouth at bedtime.  . nitroGLYCERIN (NITRODUR - DOSED IN MG/24 HR) 0.4 mg/hr patch Place 1 patch (0.4 mg total) onto the skin daily. (Patient not taking: Reported on 09/20/2018)  . nitroGLYCERIN (NITROSTAT) 0.4 MG SL tablet Place 0.4 mg under the tongue every 5 (five) minutes as needed for chest pain.  . ramipril (ALTACE) 5 MG capsule TAKE 2 CAPSULES BY MOUTH DAILY.  . [DISCONTINUED] Insulin Glargine (LANTUS SOLOSTAR) 100 UNIT/ML Solostar Pen Inject 20 Units into the skin at bedtime.   No facility-administered encounter medications on file as of 09/20/2018.      Allergies:  Allergies  Allergen Reactions  . Ambien [Zolpidem Tartrate] Other (See Comments)    HALLUCINATIONS  . Shellfish Allergy Anaphylaxis    Seafood    Family History: Family History  Problem Relation Age of Onset  . ALS Mother   . Lung cancer Father   . ALS Maternal Aunt   . Heart disease Paternal Grandmother   . Heart disease Paternal Grandfather     Social History: Social History   Tobacco Use  . Smoking status: Never Smoker  . Smokeless tobacco: Never Used  Substance Use Topics  . Alcohol use: Not Currently  . Drug use: Never   Social History   Social History Narrative   Wife and patient relocated to [place] from [place] a few months ago. - 06/11/18   He is on disability since 2019.     He lives with wife and daughter in a one-level apartment.     Review of Systems:  CONSTITUTIONAL: No fevers, chills, night sweats, or weight loss.   EYES: No visual changes or eye pain ENT: No hearing changes.  No history of nose bleeds.   RESPIRATORY: No cough, wheezing and shortness of breath.   CARDIOVASCULAR: Negative for chest pain, and palpitations.   GI: Negative for abdominal discomfort, blood in stools  or black stools.  No recent change in bowel habits.   GU:  No history of incontinence.   MUSCLOSKELETAL: No history of joint pain or swelling.  No myalgias.   SKIN: Negative for lesions, rash, and itching.   HEMATOLOGY/ONCOLOGY: Negative for prolonged bleeding, bruising easily, and swollen nodes.  No history of cancer.   ENDOCRINE: Negative for cold or heat intolerance, polydipsia or goiter.   PSYCH:  No depression or anxiety symptoms.   NEURO: As Above.   Vital Signs:  BP 114/82   Pulse 80   Ht 6\' 2"  (1.88 m)   Wt 226 lb (102.5 kg)   SpO2 95%   BMI 29.02 kg/m    General Medical Exam:   General:  Well appearing, comfortable.   Eyes/ENT: see  cranial nerve examination.   Neck: No masses appreciated.  Full range of motion without tenderness.  No carotid bruits. Respiratory:  Clear to auscultation, good air entry bilaterally.   Cardiac:  Regular rate and rhythm, no murmur.   Extremities:  No deformities, edema, or skin discoloration.  Skin:  No rashes or lesions.  Neurological Exam: MENTAL STATUS including orientation to time, place, person, recent and remote memory, attention span and concentration, language, and fund of knowledge is normal.  Speech is not dysarthric.  CRANIAL NERVES: II:  No visual field defects.  Unremarkable fundi.   III-IV-VI: Pupils equal round and reactive to light.  Normal conjugate, extra-ocular eye movements in all directions of gaze.  No nystagmus.  No ptosis.   V:  Normal facial sensation.    VII:  Normal facial symmetry and movements.  VIII:  Normal hearing and vestibular function.   IX-X:  Normal palatal movement.   XI:  Normal shoulder shrug and head rotation.   XII:  Normal tongue strength and range of motion, no deviation or fasciculation.  MOTOR:  Severe FDI, ADM, and intrinsic hand atrophy bilaterally.  No fasciculations or abnormal movements.  No pronator drift.  Tone is normal.    Right Upper Extremity:    Left Upper Extremity:    Deltoid   5/5   Deltoid  5/5   Biceps  5/5   Biceps  5/5   Triceps  5/5   Triceps  5/5   Wrist extensors  5-/5   Wrist extensors  5-/5   Wrist flexors  5-/5   Wrist flexors  5-/5   Finger extensors  4/5   Finger extensors  4/5   Finger flexors  4/5   Finger flexors  4/5   Dorsal interossei  3/5   Dorsal interossei  3/5   Abductor pollicis  4/5   Abductor pollicis  4/5   Tone (Ashworth scale)  0  Tone (Ashworth scale)  0   Right Lower Extremity:    Left Lower Extremity:    Hip flexors  5/5   Hip flexors  5/5   Hip extensors  5/5   Hip extensors  5/5   Knee flexors  5/5   Knee flexors  5/5   Knee extensors  5/5   Knee extensors  5/5   Dorsiflexors  5/5   Dorsiflexors  5/5   Plantarflexors  5/5   Plantarflexors  5/5   Toe extensors  5/5   Toe extensors  5/5   Toe flexors  5/5   Toe flexors  5/5   Tone (Ashworth scale)  0  Tone (Ashworth scale)  0   MSRs:  Right                                                                 Left brachioradialis 2+  brachioradialis 2+  biceps 2+  biceps 2+  triceps 2+  triceps 2+  patellar 2+  patellar 2+  ankle jerk 0  ankle jerk 0  Hoffman no  Hoffman no  plantar response down  plantar response down   SENSORY:  Reduced pin prick and temperature over the hands and distal to mid-calf bilaterally.  Vibration is absent below the ankles and MCP, intact at the knees and  elbows.  Proprioception is impaired.  Rhomberg sign is positive.  COORDINATION/GAIT: Normal finger-to- nose-finger.  Finger tapping is slowed due to hand weakness. Gait is slow, wide-based and assisted with cane.  He can stand on heels and toes.  He is cannot perform tandem gait.   IMPRESSION: 1.  Painful diabetic neuropathy affecting a stocking-glove distribution in the setting of poorly-controlled diabetes.  HbA1c 9.5. I had extensive discussion with the patient regarding the pathogenesis, etiology, management, and natural course of neuropathy. Neuropathy tends to be slowly progressive,  especially if underlying etiology is not adequately controlled.  Unfortunately, there is nothing to cure neuropathy and keeping tight control of diabetes can slow down the progression.  Management is symptomatic.  He is followed by pain management for his pain.  I agree with his PCP and pain management that maximal dose of gabapentin is 3600mg /d and he should not be taking any higher dose.  He is also on nortriptyline 50mg  at bedtime. For balance, I will refer him PT referral for gait training Fall precautions discussed, encouraged him to use a shower chair/add hand rails Daily foot inspection  2.  Bilateral hand weakness and atrophy due to ulnar neuropathy and progression of neuropathy.  I will request his records from Dr. Stevenson ClinchSeth Tassar's office and review his NCS/EMG, as it is atypical for hands to be weak when distal foot strength is preserved, unless of course, there is a superimposed entrapment or cervical radiculopathy.  For hand weakness, I recommend occupational therapy   3.  Spells of slurred speech, unclear etiology.  Records will be reviewed from his prior neurologist as he reports having work-up for this.   Return to clinic in 6 months.    Thank you for allowing me to participate in patient's care.  If I can answer any additional questions, I would be pleased to do so.    Sincerely,    Ernesto Lashway K. Allena KatzPatel, DO

## 2018-09-20 ENCOUNTER — Encounter: Payer: Self-pay | Admitting: Neurology

## 2018-09-20 ENCOUNTER — Other Ambulatory Visit: Payer: Self-pay | Admitting: Family Medicine

## 2018-09-20 ENCOUNTER — Ambulatory Visit (INDEPENDENT_AMBULATORY_CARE_PROVIDER_SITE_OTHER): Payer: Self-pay | Admitting: Neurology

## 2018-09-20 VITALS — BP 114/82 | HR 80 | Ht 74.0 in | Wt 226.0 lb

## 2018-09-20 DIAGNOSIS — G5623 Lesion of ulnar nerve, bilateral upper limbs: Secondary | ICD-10-CM | POA: Insufficient documentation

## 2018-09-20 DIAGNOSIS — R278 Other lack of coordination: Secondary | ICD-10-CM

## 2018-09-20 DIAGNOSIS — E1142 Type 2 diabetes mellitus with diabetic polyneuropathy: Secondary | ICD-10-CM | POA: Insufficient documentation

## 2018-09-20 DIAGNOSIS — G5793 Unspecified mononeuropathy of bilateral lower limbs: Secondary | ICD-10-CM

## 2018-09-20 DIAGNOSIS — M6281 Muscle weakness (generalized): Secondary | ICD-10-CM

## 2018-09-20 MED ORDER — PREGABALIN 75 MG PO CAPS: ORAL_CAPSULE | ORAL | 1 refills | Status: DC

## 2018-09-20 NOTE — Progress Notes (Signed)
Patient ID: Austin Vaughan, male   DOB: [birth year], 48 y.o.   MRN: [id number]   Patient needs printed RX for Lyrica 75 mg to take 1 at bedtime as recommended by Neurology so that RX can be submitted for drug companies patient assistance program.

## 2018-09-20 NOTE — Patient Instructions (Addendum)
Referral to physical therapy and occupation therapy. You have been referred to Neuro Rehab. They will call you directly to schedule an appointment.  Please call [phone] if you do not hear from them.   We will request your records from your neurology in [place]  Follow-up with your primary care doctor and pain management   Use a shower chair or add hand rails  Return to clinic in 6 months  [facility]  Preventing Falls in the Home   Falls are common, often dreaded events in the lives of older people. Aside from the obvious injuries and even death that may result, falls can cause wide-ranging consequences including loss of independence, mental decline, decreased activity, and mobility. Younger people are also at risk of falling, especially those with chronic illnesses and fatigue.  Ways to reduce the risk for falling:  * Examine diet and medications. Warm foods and alcohol dilate blood vessels, which can lead to dizziness when standing. Sleep aids, antidepressants, and pain medications can also increase the likelihood of a fall.  * Get a vison exam. Poor vision, cataracts, and glaucoma increase the chances of falling.  * Check foot gear. Shoes should fit snugly and have a sturdy, nonskid sole and broad, low heel.  * Participate in a physician-approved exercise program to build and maintain muscle strength and improve balance and coordination.  * Increase vitamin D intake. Vitamin D improves muscle strength and increases the amount of calcium the body is able to absorb and deposit in bones.  How to prevent falls from common hazards:  * Floors - Remove all loose wires, cords, and throw rugs. Minimize clutter. Make sure rugs are anchored and smooth. Keep furniture in its usual place.  * Chairs - Use chairs with straight backs, armrests, and firm seats. Add firm cushions to existing pieces to add height.  * Bathroom - Install grab bars and non-skid tape in the tub or shower. Use a bathtub  transfer bench or a shower chair with a back support. Use an elevated toilet seat and/or safety rails to assist standing from a low surface. Do not use towel racks or bathroom tissue holders to help you stand.  * Lighting - Make sure halls, stairways, and entrances are well-lit. Install a night light in your bathroom or hallway. Make sure there is a light switch at the top and bottom of the staircase. Turn lights on if you get up in the middle of the night. Make sure lamps or light switches are within reach of the bed if you have to get up during the night.  * Kitchen - Install non-skid rubber mats near the sink and stove. Clean spills immediately. Store frequently used utensils, pots, and pans between waist and eye level. This helps prevent reaching and bending. Sit when getting things out of the lower cupboards.  * Living room / Bedrooms - Place furniture with wide spaces in between, giving enough room to move around. Establish a route through the living room that gives you something to hold onto as you walk.  * Stairs - Make sure treads, rails, and rugs are secure. Install a rail on both sides of the stairs. If stairs are a threat, it might be helpful to arrange most of your activities on the lower level to reduce the number of times you must climb the stairs.  * Entrances and doorways - Install metal handles on the walls adjacent to the doorknobs of all doors to make it more secure as  you travel through the doorway.  Tips for maintaining balance:  * Keep at least one hand free at all times Try using a backpack or fanny pack to hold things rather than carrying them in your hands. Never carry objects in both hands when walking as this interferes with keeping your balance.  * Consciously lift your feet off the ground when walking. Shuffling and dragging of the feet is a common culprit in losing your balance.  * When trying to navigate turns, use a "U" technique of facing forward and making a wide turn,  rather than pivoting sharply.  * Try to stand with your feet shoulder-length apart. When your feet are close together for any length of time, you increase your risk of losing your balance and falling.  * Do one thing at a time. Do not try to walk and accomplish another task, such as reading or looking around. The decrease in your automatic reflexes complicates motor function, so the less distraction, the better.  * Do not wear rubber or gripping soled shoes, they might "catch" on the floor and cause tripping.  * Move slowly when changing positions. Use deliberate, concentrated movements and, if needed, use a grab bar or walking aid. Count fifteen (15) seconds after standing to begin walking.  * If balance is a continuous problem, you might want to consider a walking aid such as a cane, walking stick, or walker. Once you have mastered walking with help, you may be ready to try it again on your own.  This information is provided by [facility] and is not intended to replace the medical advice of your physician or other health care providers. Please consult your physician or other health care providers for advice regarding your specific medical condition.

## 2018-09-24 MED FILL — !LANTUS SOLOSTAR 100UNITS/M: 100 | 25 days supply | Qty: 9 | Fill #0

## 2018-09-25 ENCOUNTER — Encounter: Payer: Self-pay | Admitting: Family Medicine

## 2018-09-26 ENCOUNTER — Encounter: Payer: Self-pay | Admitting: Family Medicine

## 2018-09-26 NOTE — Telephone Encounter (Signed)
Pt will need further adjustment of Lyrica and reduction of Gabapentin,  Missed follow up with Riley Lam in January Needs to come back for appt to do additional assessment and treatment

## 2018-10-01 ENCOUNTER — Other Ambulatory Visit: Payer: Self-pay | Admitting: Family Medicine

## 2018-10-01 ENCOUNTER — Ambulatory Visit (INDEPENDENT_AMBULATORY_CARE_PROVIDER_SITE_OTHER): Payer: Self-pay

## 2018-10-01 DIAGNOSIS — I10 Essential (primary) hypertension: Secondary | ICD-10-CM

## 2018-10-01 DIAGNOSIS — R002 Palpitations: Secondary | ICD-10-CM

## 2018-10-01 MED FILL — GABAPENTIN 600 MG TABLET: 600 | 30 days supply | Qty: 180 | Fill #3

## 2018-10-01 NOTE — Telephone Encounter (Signed)
Patient is requesting advice on pain and endo referral.

## 2018-10-01 NOTE — Telephone Encounter (Signed)
Patient would like to be advised on pains and other concerns he is having.

## 2018-10-02 MED FILL — AMLODIPINE BESYLATE 10 MG T: 10 | 30 days supply | Qty: 30 | Fill #0

## 2018-10-03 ENCOUNTER — Ambulatory Visit: Payer: Medicaid Other | Admitting: Pharmacist

## 2018-10-03 ENCOUNTER — Other Ambulatory Visit: Payer: Self-pay | Admitting: Family Medicine

## 2018-10-03 DIAGNOSIS — Z794 Long term (current) use of insulin: Secondary | ICD-10-CM

## 2018-10-03 DIAGNOSIS — E1142 Type 2 diabetes mellitus with diabetic polyneuropathy: Secondary | ICD-10-CM

## 2018-10-03 NOTE — Progress Notes (Deleted)
    S:    PCP: Dr. Jillyn Hidden   No chief complaint on file.  Patient arrives in good spirits. Presents for diabetes management at the request of Dr. Jillyn Hidden. Patient was referred and last seen by Dr Jillyn Hidden on 09/19/18. Lantus was increased to 35 units. She instructed pt to self-titrate.   Family/Social History:  - CAD (paternal grandparents) - Never smoker - Denies drinking alcohol  Insurance coverage/medication affordability: no prescription coverage  Patient *** adherence with medications.  Current diabetes medications include:   - Insulin glargine 35 units daily  - Insulin aspart 14 units TID  Patient *** hypoglycemic events.  Patient reported dietary habits:  - High intake of white rice and beans  Patient-reported exercise habits:  - Denies exercise   Patient reports polyuria and polydipsia.  Patient reports neuropathy. Patient reports visual changes. Patient reports self foot exams.   O:  POCT glucose: 249 Home fasting CBG: 200s 2 hour post-prandial/random CBG: 200-low 300s  Lab Results  Component Value Date   HGBA1C 9.5 (A) 09/19/2018   There were no vitals filed for this visit.  Lipid Panel     Component Value Date/Time   CHOL 179 07/24/2018 1215   TRIG 100 07/24/2018 1215   HDL 48 07/24/2018 1215   CHOLHDL 3.7 07/24/2018 1215   LDLCALC 111 (H) 07/24/2018 1215    Clinical ASCVD: Yes - CAD s/p PCI, MI 2013  A/P: Diabetes longstanding currently uncontrolled. Patient is able to verbalize appropriate hypoglycemia management plan. Patient is adherent with medication. Control is suboptimal due to dietary indiscretion and sedentary lifestyle.   He continues to have hyperglycemia-associated symptoms. Sugars have improved marginally. I will increase both Lantus and Victoza today.   -Increased dose of basal insulin Lantus to 30 units daily.  -Continued Novolog 14 units TID. -Increased Victoza to 1.2 mg daily. He has been instructed to increase to 1.8 1 week from today  if he is experiencing no hypoglycemia or NV/abdominal pain.  -Extensively discussed pathophysiology of DM, recommended lifestyle interventions, dietary effects on glycemic control -Counseled on s/sx of and management of hypoglycemia -Next A1C anticipated 09/2018.  -Microalb:creatinine   ASCVD risk - secondary prevention in patient with DM. Patient with no lipid panel results in [facility]. High intensity statin and ASA indicated at this time. Recommend LDL goal < 70. Will follow. He may be a candidate for addition of Zetia in the future to achieve LDL goal.  -Continued aspirin 81 mg  -Continued atorvastatin 40 mg.   HM: flu, PNA, and tetanus indicated.  - Pt declined flu - PNA and tetanus vaccines given  Written patient instructions provided. Total time in face to face counseling 30 minutes.   Follow upw/ PCP in January.  Butch Penny, PharmD, CPP Clinical Pharmacist [facility] [phone]

## 2018-10-03 NOTE — Progress Notes (Signed)
Patient ID: Austin Vaughan, male   DOB: 1970-12-08, 48 y.o.   MRN: [id number]    Patient contacted me through My Chart requesting an endocrinology referral regarding his diabetes and patient is interested in a glucose monitoring system that does not require finger sticks. Referral placed

## 2018-10-08 ENCOUNTER — Ambulatory Visit (HOSPITAL_BASED_OUTPATIENT_CLINIC_OR_DEPARTMENT_OTHER): Payer: Self-pay | Admitting: Physical Medicine & Rehabilitation

## 2018-10-08 ENCOUNTER — Encounter: Payer: Self-pay | Attending: Physical Medicine & Rehabilitation

## 2018-10-08 ENCOUNTER — Encounter: Payer: Self-pay | Admitting: Physical Medicine & Rehabilitation

## 2018-10-08 ENCOUNTER — Other Ambulatory Visit: Payer: Self-pay

## 2018-10-08 VITALS — BP 121/86 | HR 84 | Ht 74.0 in | Wt 229.0 lb

## 2018-10-08 DIAGNOSIS — E0842 Diabetes mellitus due to underlying condition with diabetic polyneuropathy: Secondary | ICD-10-CM | POA: Insufficient documentation

## 2018-10-08 DIAGNOSIS — E1165 Type 2 diabetes mellitus with hyperglycemia: Secondary | ICD-10-CM

## 2018-10-08 DIAGNOSIS — IMO0002 Reserved for concepts with insufficient information to code with codable children: Secondary | ICD-10-CM

## 2018-10-08 DIAGNOSIS — E1140 Type 2 diabetes mellitus with diabetic neuropathy, unspecified: Secondary | ICD-10-CM

## 2018-10-08 NOTE — Progress Notes (Signed)
Subjective:    Patient ID: Austin Vaughan, male    DOB: 1971-06-11, 48 y.o.   MRN: [id number]  HPI 48 year old male with uncontrolled diabetes and severe diabetic neuropathy pain.  His pain is mainly in his feet.  It is worse both at night and with prolonged standing or walking.  He also experiences weakness in the hands. Before moving to [facility] he did have EMG/NCV he was told by one neurologist that he may be benefit from ulnar nerve transposition's but when he saw the neurosurgeon he was told that this procedure may not help.  The patient has not had any open sores on his feet. He is independent with all his self-care and mobility. He has been maintained in the past on very high-dose gabapentin up to 4800 mg/day but now is on 3600 mg.  He just received his pregabalin yesterday and started at 75 mg dose.  He did not notice any benefit immediately.  We discussed that it takes at least a week to evaluate to see whether this may be helpful.  We also discussed that if he does not get good relief in 7 days to double his dose to 150 mg.  He would be happy if his nighttime pain was relieved and is less concerned about the daytime pain as it is less bothersome Pain Inventory Average Pain 10 Pain Right Now 7 My pain is sharp, burning, tingling and aching  In the last 24 hours, has pain interfered with the following? General activity 10 Relation with others 10 Enjoyment of life 10 What TIME of day is your pain at its worst? night Sleep (in general) Poor  Pain is worse with: walking, inactivity, unsure and some activites Pain improves with: medication Relief from Meds: 5  Mobility walk without assistance walk with assistance use a cane how many minutes can you walk? 10 ability to climb steps?  no do you drive?  no use a wheelchair  Function disabled: date disabled 06/2018  Neuro/Psych bladder control problems bowel control problems weakness numbness tremor tingling trouble  walking confusion depression anxiety loss of taste or smell  Prior Studies Any changes since last visit?  no  Physicians involved in your care Cain Saupe, MD   Family History  Problem Relation Age of Onset  . ALS Mother   . Lung cancer Father   . ALS Maternal Aunt   . Heart disease Paternal Grandmother   . Heart disease Paternal Grandfather    Social History   Socioeconomic History  . Marital status: Married    Spouse name: Not on file  . Number of children: Not on file  . Years of education: Not on file  . Highest education level: Not on file  Occupational History  . Not on file  Social Needs  . Financial resource strain: Not on file  . Food insecurity:    Worry: Not on file    Inability: Not on file  . Transportation needs:    Medical: Not on file    Non-medical: Not on file  Tobacco Use  . Smoking status: Never Smoker  . Smokeless tobacco: Never Used  Substance and Sexual Activity  . Alcohol use: Not Currently  . Drug use: Never  . Sexual activity: Yes  Lifestyle  . Physical activity:    Days per week: Not on file    Minutes per session: Not on file  . Stress: Not on file  Relationships  . Social connections:    Talks  on phone: Not on file    Gets together: Not on file    Attends religious service: Not on file    Active member of club or organization: Not on file    Attends meetings of clubs or organizations: Not on file    Relationship status: Not on file  Other Topics Concern  . Not on file  Social History Narrative   Wife and patient relocated to [place] from [place] a few months ago. - 06/11/18   He is on disability since 2019.     He lives with wife and daughter in a one-level apartment.    Past Surgical History:  Procedure Laterality Date  . CORONARY ANGIOPLASTY WITH STENT PLACEMENT  07/2002   Past Medical History:  Diagnosis Date  . Coronary artery disease   . Diabetes mellitus without complication (HCC)   . Hypertension    BP  121/86   Pulse 84   Ht 6\' 2"  (1.88 m)   Wt 229 lb (103.9 kg)   SpO2 96%   BMI 29.40 kg/m   Opioid Risk Score:   Fall Risk Score:  `1  Depression screen PHQ 2/9  Depression screen [facility] 2/9 10/08/2018 09/19/2018 08/22/2018 07/26/2018 07/08/2018 06/11/2018  Decreased Interest 1 3 3 2 3 1   Down, Depressed, Hopeless 1 3 3 2 2 1   PHQ - 2 Score 2 6 6 4 5 2   Altered sleeping - 3 3 3 3 3   Tired, decreased energy - 3 3 3 3 3   Change in appetite - 2 3 1 2  0  Feeling bad or failure about yourself  - 3 2 2 2  0  Trouble concentrating - 3 3 3 3 1   Moving slowly or fidgety/restless - 3 3 2 2 1   Suicidal thoughts - 1 1 0 1 0  PHQ-9 Score - 24 24 18 21 10   Difficult doing work/chores - - - Very difficult - -    Review of Systems  Constitutional: Negative.   HENT: Negative.   Eyes: Negative.   Respiratory: Positive for apnea and shortness of breath.   Cardiovascular: Negative.   Gastrointestinal: Positive for constipation and diarrhea.  Endocrine: Negative.   Genitourinary: Negative.   Musculoskeletal: Negative.   Skin: Negative.   Allergic/Immunologic: Negative.   Neurological: Negative.   Hematological: Negative.   Psychiatric/Behavioral: Negative.   All other systems reviewed and are negative.      Objective:   Physical Exam  Patient with bilateral hand intrinsic atrophy. His motor strength is 5/5 bilateral deltoid bicep tricep and 4- at the grip. He has 5/5 strength in bilateral hip flexor knee extensor ankle dorsiflexor. Feet have no evidence of intrinsic atrophy there are no lesions on the feet.  He does decrease sensation up to the mid calf level bilaterally.  He has diffuse decreased sensation in his hands to pinprick but is able to distinguish which fingers touch.  Below his mid calf area he is insensate. Ambulates without assistive device no evidence of toe drag or knee instability      Assessment & Plan:  #1.  Uncontrolled diabetes with diabetic neuropathy.  We discussed  that his first line of business is getting the blood sugars under control to limit progression of his neuropathy.  We discussed that even with good diabetic control he may not reverse the effects of his neuropathy but certainly should not get much worse. We discussed his medication management and that it may be a longer process gradually increasing the  pregabalin dose and reducing his gabapentin dose.  He will be maintained on his current dose of nortriptyline 50 mg/day We will see him back in 3 months he can call if medication changes are not effective and we may make some further adjustments over the phone. We also discussed use of topical agents such as capsaicin on the hands and feet and covering with cloth gloves and socks at night

## 2018-10-08 NOTE — Patient Instructions (Addendum)
Please talk to your primary MD about a consultation with a dietician for proper food choices or visit the American Dietetic Association website for information about diabetic diet  Increase lyrica to 2 tablets at night if neuropathy pain not much better in 1 week  Use capsaicin cream or similar cream at night and wear socks and gloves to bed

## 2018-10-11 MED FILL — NORTRIPTYLINE HCL 50 MG CAP: 50 | 30 days supply | Qty: 30 | Fill #4

## 2018-10-11 MED FILL — ATORVASTATIN CALCIUM 40 MG: 40 | 30 days supply | Qty: 30 | Fill #3

## 2018-10-11 MED FILL — METOPROLOL SUCCINATE ER 25: 25 | 30 days supply | Qty: 30 | Fill #3

## 2018-10-14 NOTE — Telephone Encounter (Signed)
Please direct pt to f/u with [name] Fulp on this

## 2018-10-15 ENCOUNTER — Other Ambulatory Visit: Payer: Self-pay | Admitting: Family Medicine

## 2018-10-15 DIAGNOSIS — G5793 Unspecified mononeuropathy of bilateral lower limbs: Secondary | ICD-10-CM

## 2018-10-15 MED ORDER — PREGABALIN 75 MG PO CAPS: ORAL_CAPSULE | ORAL | 3 refills | Status: DC

## 2018-10-15 NOTE — Progress Notes (Unsigned)
Patient ID: Austin Vaughan, male   DOB: [birth year], 48 y.o.   MRN: [id number]   Patient with an increase in dose of Lyrica to 150 mg once per night per pain management and new RX needed for patient assistance program

## 2018-10-17 ENCOUNTER — Ambulatory Visit (INDEPENDENT_AMBULATORY_CARE_PROVIDER_SITE_OTHER): Payer: Self-pay | Admitting: Internal Medicine

## 2018-10-17 ENCOUNTER — Encounter: Payer: Self-pay | Admitting: Internal Medicine

## 2018-10-17 VITALS — BP 120/70 | HR 71 | Ht 74.0 in | Wt 229.6 lb

## 2018-10-17 DIAGNOSIS — E1142 Type 2 diabetes mellitus with diabetic polyneuropathy: Secondary | ICD-10-CM

## 2018-10-17 DIAGNOSIS — G63 Polyneuropathy in diseases classified elsewhere: Secondary | ICD-10-CM

## 2018-10-17 DIAGNOSIS — R002 Palpitations: Secondary | ICD-10-CM

## 2018-10-17 DIAGNOSIS — Z794 Long term (current) use of insulin: Secondary | ICD-10-CM

## 2018-10-17 LAB — BASIC METABOLIC PANEL
BUN: 12 mg/dL (ref 6–23)
CO2: 29 mEq/L (ref 19–32)
Calcium: 9.7 mg/dL (ref 8.4–10.5)
Chloride: 101 mEq/L (ref 96–112)
Creatinine, Ser: 0.73 mg/dL (ref 0.40–1.50)
GFR: 113.85 mL/min (ref >60.00)
Glucose, Bld: 307 mg/dL — ABNORMAL HIGH (ref 70–99)
Potassium: 4 mEq/L (ref 3.5–5.1)
Sodium: 137 mEq/L (ref 135–145)

## 2018-10-17 LAB — T4, FREE: Free T4: 0.96 ng/dL (ref 0.60–1.60)

## 2018-10-17 LAB — TSH: TSH: 0.96 u[IU]/mL (ref 0.35–4.50)

## 2018-10-17 LAB — VITAMIN B12: Vitamin B-12: 421 pg/mL (ref 211–911)

## 2018-10-17 MED ORDER — METFORMIN HCL ER 500 MG PO TB24: 1000.0000 mg | ORAL_TABLET | Freq: Two times a day (BID) | ORAL | 6 refills | Status: DC

## 2018-10-17 MED FILL — METFORMIN HCL ER 500 MG TAB: 500 | 30 days supply | Qty: 120 | Fill #0

## 2018-10-17 MED FILL — $LANTUS SOLOSTAR 100 UNITS/: 100 | 75 days supply | Qty: 27 | Fill #1

## 2018-10-17 NOTE — Patient Instructions (Addendum)
-   STOP GLIPIZIDE - START Metfromin Start 1 pill daily with breakfast  x 1 week, then increase to 1 pill twice a day (breakfast and supper) x 1 week, then increase to 2 pills in AM with breakfast and 1 pill in PM with supper, finally 2 pills twice a day with breakfast and supper.  - Continue Lantus at 35 units daily  - Increase Novolog to 16 units with each meal    Choose healthy, lower carb lower calorie snacks: toss salad, cooked vegetables, cottage cheese, peanut butter, low fat cheese / string cheese, lower sodium deli meat, tuna salad or chicken salad    HOW TO TREAT LOW BLOOD SUGARS (Blood sugar LESS THAN 70 MG/DL)  Please follow the RULE OF 15 for the treatment of hypoglycemia treatment (when your (blood sugars are less than 70 mg/dL)    STEP 1: Take 15 grams of carbohydrates when your blood sugar is low, which includes:   3-4 GLUCOSE TABS  OR  3-4 OZ OF JUICE OR REGULAR SODA OR  ONE TUBE OF GLUCOSE GEL     STEP 2: RECHECK blood sugar in 15 MINUTES STEP 3: If your blood sugar is still low at the 15 minute recheck --> then, go back to STEP 1 and treat AGAIN with another 15 grams of carbohydrates.

## 2018-10-17 NOTE — Progress Notes (Signed)
Name: Austin SidleHenry C Caillier  MRN/ DOB: [id number], 08/09/1971   Age/ Sex: 48 y.o., male    PCP: Cain SaupeFulp, [name], MD   Reason for Endocrinology Evaluation: Type 2 Diabetes Mellitus     Date of Initial Endocrinology Visit: 10/17/2018     PATIENT IDENTIFIER: Austin Vaughan is a 48 y.o. male with a past medical history of T2DM, HTN, CAD. The patient presented for initial endocrinology clinic visit on 10/17/2018 for consultative assistance with his diabetes management.    HPI: Austin Vaughan was    Diagnosed with T2DM since 07/2012 Prior Medications tried/Intolerance: Has been on insulin for a couple years, Januvia - ineffective, Metformin - unknown  Currently checking blood sugars 0 x / day,  before breakfast  Hypoglycemia episodes : no              Hemoglobin A1c has ranged from 9.5% in 2020, peaking at 10.0% in2019. Patient required assistance for hypoglycemia: no Patient has required hospitalization within the last 1 year from hyper or hypoglycemia: no  In terms of diet, the patient eats 2 meals a day, drinks regular sodas at times  Patient is here with his wife today.  HOME DIABETES REGIMEN: Lantus 35 units daily  Novolog 14 units TID QAC Glipizide 10 mg daily   Statin: yes ACE-I/ARB: yes Prior Diabetic Education: yes   METER DOWNLOAD SUMMARY: Did not    DIABETIC COMPLICATIONS: Microvascular complications:   Neuropathy  Denies: CKD, retinopathy  Last eye exam: Completed 03/2018  Macrovascular complications:   CAD  Denies:PVD, CVA   PAST HISTORY: Past Medical History:  Past Medical History:  Diagnosis Date  . Coronary artery disease   . Diabetes mellitus without complication (HCC)   . Hypertension     Past Surgical History:  Past Surgical History:  Procedure Laterality Date  . CORONARY ANGIOPLASTY WITH STENT PLACEMENT  07/2002      Social History:  reports that he has never smoked. He has never used smokeless tobacco. He reports previous alcohol use. He reports  that he does not use drugs. Family History:  Family History  Problem Relation Age of Onset  . ALS Mother   . Lung cancer Father   . ALS Maternal Aunt   . Heart disease Paternal Grandmother   . Heart disease Paternal Grandfather       HOME MEDICATIONS: Allergies as of 10/17/2018      Reactions   Ambien [zolpidem Tartrate] Other (See Comments)   HALLUCINATIONS   Shellfish Allergy Anaphylaxis   Seafood      Medication List       Accurate as of October 17, 2018  9:51 AM. Always use your most recent med list.        amLODipine 10 MG tablet Commonly known as:  NORVASC TAKE 1 TABLET (10 MG TOTAL) BY MOUTH DAILY. TO LOWER BLOOD PRESSURE   aspirin EC 81 MG tablet Take 81 mg by mouth daily.   atorvastatin 40 MG tablet Commonly known as:  LIPITOR Take 1 tablet (40 mg total) by mouth daily.   dicyclomine 20 MG tablet Commonly known as:  BENTYL Take 1 tablet (20 mg total) by mouth 4 (four) times daily. As needed for abdominal cramping   gabapentin 600 MG tablet Commonly known as:  NEURONTIN Take 6 tablets (3,600 mg total) by mouth at bedtime.   glipiZIDE 10 MG tablet Commonly known as:  GLUCOTROL Take 1 tablet (10 mg total) by mouth daily before breakfast.   Insulin  Glargine 100 UNIT/ML Solostar Pen Commonly known as:  LANTUS SOLOSTAR Inject 35 Units into the skin daily.   metoprolol succinate 25 MG 24 hr tablet Commonly known as:  TOPROL XL Take 1 tablet (25 mg total) by mouth daily.   montelukast 10 MG tablet Commonly known as:  SINGULAIR Take 10 mg by mouth daily.   nitroGLYCERIN 0.4 MG SL tablet Commonly known as:  NITROSTAT Place 0.4 mg under the tongue every 5 (five) minutes as needed for chest pain.   nitroGLYCERIN 0.4 mg/hr patch Commonly known as:  NITRODUR - Dosed in mg/24 hr Place 1 patch (0.4 mg total) onto the skin daily.   nortriptyline 50 MG capsule Commonly known as:  PAMELOR Take 1 capsule (50 mg total) by mouth at bedtime.   NOVOLOG FLEXPEN  100 UNIT/ML FlexPen Generic drug:  insulin aspart Inject 14 Units into the skin 3 (three) times daily with meals.   pregabalin 75 MG capsule Commonly known as:  LYRICA Two pills daily  (150 mg) at bedtime for neuropathic pain   PROAIR HFA 108 (90 Base) MCG/ACT inhaler Generic drug:  albuterol Inhale 2 puffs into the lungs every 6 (six) hours as needed for wheezing or shortness of breath.   ramipril 5 MG capsule Commonly known as:  ALTACE TAKE 2 CAPSULES BY MOUTH DAILY.        ALLERGIES: Allergies  Allergen Reactions  . Ambien [Zolpidem Tartrate] Other (See Comments)    HALLUCINATIONS  . Shellfish Allergy Anaphylaxis    Seafood     REVIEW OF SYSTEMS: A comprehensive ROS was conducted with the patient and is negative except as per HPI and below:  Review of Systems  Constitutional: Negative for fever and weight loss.  HENT: Negative for congestion and sore throat.   Respiratory: Negative for cough and shortness of breath.   Cardiovascular: Positive for palpitations. Negative for chest pain.  Gastrointestinal: Negative for diarrhea and nausea.  Genitourinary: Negative for frequency.  Skin: Negative.   Neurological: Positive for tingling and sensory change.  Endo/Heme/Allergies: Negative for polydipsia.  Psychiatric/Behavioral: Negative for depression. The patient is not nervous/anxious.       OBJECTIVE:   VITAL SIGNS: BP 120/70 (BP Location: Left Arm, Patient Position: Sitting, Cuff Size: Normal)   Pulse 71   Ht 6\' 2"  (1.88 m)   Wt 229 lb 9.6 oz (104.1 kg)   SpO2 97%   BMI 29.48 kg/m    PHYSICAL EXAM:  General: Pt appears well and is in NAD  Hydration: Well-hydrated with moist mucous membranes and good skin turgor  HEENT: Head: Unremarkable with good dentition. Oropharynx clear without exudate.  Eyes: External eye exam normal without stare, lid lag or exophthalmos.  EOM intact.  PERRL.  Neck: General: Supple without adenopathy or carotid bruits. Thyroid:  Thyroid size normal.  No goiter or nodules appreciated. No thyroid bruit.  Lungs: Clear with good BS bilat with no rales, rhonchi, or wheezes  Heart: RRR with normal S1 and S2 and no gallops; no murmurs; no rub  Abdomen: Normoactive bowel sounds, soft, nontender, without masses or organomegaly palpable  Extremities:  Lower extremities - No pretibial edema. No lesions.  Skin: Normal texture and temperature to palpation. No rash noted.  Neuro: MS is good with appropriate affect, pt is alert and Ox3    DM foot exam: 10/17/2018 The skin of the feet is intact without sores or ulcerations. The pedal pulses are 1+ on right and 1+ on left. The sensation is decreased  to a screening 5.07, 10 gram monofilament bilaterally   DATA REVIEWED:  Lab Results  Component Value Date   HGBA1C 9.5 (A) 09/19/2018   HGBA1C 10.0 (A) 07/08/2018   Lab Results  Component Value Date   LDLCALC 111 (H) 07/24/2018   CREATININE 0.67 (L) 08/22/2018     Lab Results  Component Value Date   CHOL 179 07/24/2018   HDL 48 07/24/2018   LDLCALC 111 (H) 07/24/2018   TRIG 100 07/24/2018   CHOLHDL 3.7 07/24/2018      Results for AMIE, SPAWN (MRN [id number]) as of 10/20/2018 11:43  Ref. Range 10/17/2018 10:44  Sodium Latest Ref Range: 135 - 145 mEq/L 137  Potassium Latest Ref Range: 3.5 - 5.1 mEq/L 4.0  Chloride Latest Ref Range: 96 - 112 mEq/L 101  CO2 Latest Ref Range: 19 - 32 mEq/L 29  Glucose Latest Ref Range: 70 - 99 mg/dL 779 (H)  BUN Latest Ref Range: 6 - 23 mg/dL 12  Creatinine Latest Ref Range: 0.40 - 1.50 mg/dL 3.90  Calcium Latest Ref Range: 8.4 - 10.5 mg/dL 9.7  GFR Latest Ref Range: >60.00 mL/min 113.85  Vitamin B12 Latest Ref Range: 211 - 911 pg/mL 421   Results for CANE, NAEEM (MRN [id number]) as of 10/20/2018 11:43  Ref. Range 10/17/2018 10:44  TSH Latest Ref Range: 0.35 - 4.50 uIU/mL 0.96  T4,Free(Direct) Latest Ref Range: 0.60 - 1.60 ng/dL 7.62   ASSESSMENT / PLAN / RECOMMENDATIONS:   1) Type 2  Diabetes Mellitus, Poorly controlled, With neuropathy  complications - Most recent A1c of 9.5 %. Goal A1c < 7.0 %.    Plan: GENERAL: I have discussed with the patient the pathophysiology of diabetes. We went over the natural progression of the disease. We talked about both insulin resistance and insulin deficiency. We stressed the importance of lifestyle changes including diet and exercise. I explained the complications associated with diabetes including retinopathy, nephropathy, neuropathy as well as increased risk of cardiovascular disease. We went over the benefit seen with glycemic control.    I explained to the patient that diabetic patients are at higher than normal risk for amputations. The patient was informed that diabetes is the number one cause of non-traumatic amputations in [place].  I have explained to him that controlling his glucose at this time will help slow down the progression of neuropathy, but I am not sure it is going to improve a whole lot in his actual pain at this time. Discussed pharmacokinetics of basal/bolus insulin and the importance of taking prandial insulin with meals.   We also discussed avoiding sugar-sweetened beverages and snacks, when possible.   So discussed that with the presence of insulin, sulfonylurea's increase the risk of hypoglycemia and would recommend stopping it , in the setting of MDI regimen.  We will start metformin to help improve his insulin sensitivity.  I have explained to him the importance of glucose checks, and the importance of the availability of sugar data to make proper medication adjustments.  He will be referred to our CDE  Patient is interested in CGM, we have discussed the criteria to meet requirements which would require him to check his BG's at least 4 times daily.   MEDICATIONS:  Continue Lantus at 35 units daily  Increase NovoLog to 16 units 3 times daily q. before meals  Start metformin 500 mg XR 2 tablets twice  daily with meals- titration instructions provided  EDUCATION / INSTRUCTIONS:  BG monitoring instructions: Patient is  instructed to check his blood sugars 4 times a day, before meals and bedtime.  Call [facility] if: BG persistently < 70 or > 300. . I reviewed the Rule of 15 for the treatment of hypoglycemia in detail with the patient. Literature supplied.   2) Diabetic complications:   Eye: Does not have known diabetic retinopathy.   Neuro/ Feet: Does have known diabetic peripheral neuropathy.  Renal: Patient does not have known baseline CKD. He is on an ACEI/ARB at present.   3) Lipids: Patient is  on a statin.    4) Hypertension: He is at goal of < 140/90 mmHg.   5.)  Peripheral Neuropathy:   -This is the most distressing complaint to the patient. -He is already on nortriptyline, and gabapentin. -Another option that could be added to his current regimen is Cymbalta, I will defer this to his PCP.  If he continues to have symptoms, it may be an option to send him to a pain management clinic. -Vitamin B12 levels are normal on today's test. -I have advised him to start vitamin D3 1000 units daily  6) Palpitations: -Repeat thyroid function today is normal.   Follow-up in 6 weeks  Signed electronically by: Lyndle Herrlich, MD  [facility]  [facility] [address] [place], [place] [address] Phone: [phone] FAX: [phone]   CC: Cain Saupe, MD [address] [place] [address] Phone: [phone]  Fax: [phone]    Return to Endocrinology clinic as below: Future Appointments  Date Time Provider Department Center  10/22/2018  4:00 PM Lorin Mercy [facility]  10/31/2018  2:30 PM Cain Saupe, MD [facility] None  11/21/2018  9:10 AM Cain Saupe, MD [facility] None  01/07/2019 10:45 AM Kirsteins, Victorino Sparrow, MD [facility] None  03/21/2019 10:50 AM Nita Sickle K, DO [facility] None

## 2018-10-18 ENCOUNTER — Other Ambulatory Visit: Payer: Self-pay

## 2018-10-18 MED ORDER — GLUCOSE BLOOD VI STRP: ORAL_STRIP | 12 refills | Status: DC

## 2018-10-18 MED ORDER — TRUE METRIX METER W/DEVICE KIT: PACK | 0 refills | Status: DC

## 2018-10-18 MED FILL — TRUE METRIX TEST STRIP: 25 days supply | Qty: 100 | Fill #0

## 2018-10-18 MED FILL — !TRUE METRIX BLOOD GLUCOSE: 30 days supply | Qty: 1 | Fill #0

## 2018-10-22 ENCOUNTER — Ambulatory Visit (INDEPENDENT_AMBULATORY_CARE_PROVIDER_SITE_OTHER): Payer: Self-pay | Admitting: Cardiology

## 2018-10-22 VITALS — BP 110/74 | HR 90 | Ht 74.0 in | Wt 231.0 lb

## 2018-10-22 DIAGNOSIS — I10 Essential (primary) hypertension: Secondary | ICD-10-CM

## 2018-10-22 DIAGNOSIS — E785 Hyperlipidemia, unspecified: Secondary | ICD-10-CM

## 2018-10-22 DIAGNOSIS — E1140 Type 2 diabetes mellitus with diabetic neuropathy, unspecified: Secondary | ICD-10-CM

## 2018-10-22 DIAGNOSIS — R002 Palpitations: Secondary | ICD-10-CM

## 2018-10-22 DIAGNOSIS — Z794 Long term (current) use of insulin: Secondary | ICD-10-CM

## 2018-10-22 DIAGNOSIS — IMO0001 Reserved for inherently not codable concepts without codable children: Secondary | ICD-10-CM

## 2018-10-22 DIAGNOSIS — Z9861 Coronary angioplasty status: Secondary | ICD-10-CM

## 2018-10-22 DIAGNOSIS — I2510 Atherosclerotic heart disease of native coronary artery without angina pectoris: Secondary | ICD-10-CM

## 2018-10-22 DIAGNOSIS — E118 Type 2 diabetes mellitus with unspecified complications: Secondary | ICD-10-CM

## 2018-10-22 DIAGNOSIS — I471 Supraventricular tachycardia: Secondary | ICD-10-CM

## 2018-10-22 MED ORDER — METOPROLOL SUCCINATE ER 50 MG PO TB24: 50.0000 mg | ORAL_TABLET | Freq: Every day | ORAL | 6 refills | Status: DC

## 2018-10-22 NOTE — Assessment & Plan Note (Signed)
Short runs, 10 beats or less, on ZIO- Toprol increased

## 2018-10-22 NOTE — Patient Instructions (Signed)
Medication Instructions:  INCREASE Metoprolol to 50mg  Take 1 tablet once a day  If you need a refill on your cardiac medications before your next appointment, please call your pharmacy.   Lab work: None  If you have labs (blood work) drawn today and your tests are completely normal, you will receive your results only by: [name] MyChart Message (if you have MyChart) OR . A paper copy in the mail If you have any lab test that is abnormal or we need to change your treatment, we will call you to review the results.  Testing/Procedures: None   Follow-Up: At [facility], you and your health needs are our priority.  As part of our continuing mission to provide you with exceptional heart care, we have created designated Provider Care Teams.  These Care Teams include your primary Cardiologist (physician) and Advanced Practice Providers (APPs -  Physician Assistants and Nurse Practitioners) who all work together to provide you with the care you need, when you need it. You will need a follow up appointment in 6 months.  Please call our office 2 months (July) in advance to schedule this appointment.  You may see Chilton Si, MD or one of the following Advanced Practice Providers on your designated Care Team:   Corine Shelter, PA-C Judy Pimple, [place] . Marjie Skiff, PA-C  Any Other Special Instructions Will Be Listed Below (If Applicable).

## 2018-10-22 NOTE — Progress Notes (Signed)
10/22/2018 Austin Vaughan   05-05-1971  [id number]  Primary Physician [name], MD Primary Cardiologist: Dr Oval Linsey  HPI:  Austin Vaughan is a 48 year old male who was referred to Dr. Oval Linsey to be established in November 2019.  He has a history of prior MI while living in [place].  This was in 2013.  He apparently had a PCI, the vessel is unknown.  He moved to [place] to be closer to his daughter-in-law.  In September 2019 before leaving [place] he says he had a stress test that was negative for ischemia.    When he saw Dr. Oval Linsey in November he complained of some chest pain although it was not clear this was anginal.  The patient had been wearing his nitroglycerin patch intermittently.  Dr. Oval Linsey suggested he take this daily and also added Toprol 25 mg then.  The patient was seen in the office 09/17/2018 with complaints of palpitations.  This is been going on for 3 to 4 weeks.  He had occasional skipped beats and also short runs of tachycardia that lasted about a minute.  At times he felt faint with these episodes.  He did not have true syncope.   I ordered a 14 day ZIO.  He returns today for follow up. The monitor did show short runs (10 beats or less) of PSVT.  I suggested he increase his Toprol to 50 mg daily.  The patient has also been evaluated by a neurologist, he apparently has pretty severe irreversible neuropathy.    Current Outpatient Medications  Medication Sig Dispense Refill  . albuterol (PROAIR HFA) 108 (90 Base) MCG/ACT inhaler Inhale 2 puffs into the lungs every 6 (six) hours as needed for wheezing or shortness of breath.    [name] amLODipine (NORVASC) 10 MG tablet TAKE 1 TABLET (10 MG TOTAL) BY MOUTH DAILY. TO LOWER BLOOD PRESSURE 30 tablet 2  . aspirin EC 81 MG tablet Take 81 mg by mouth daily.    [name] atorvastatin (LIPITOR) 40 MG tablet Take 1 tablet (40 mg total) by mouth daily. 30 tablet 5  . Blood Glucose Monitoring Suppl (TRUE METRIX METER) w/Device KIT Use  daily to check blood sugar 4 times daily. 1 kit 0  . dicyclomine (BENTYL) 20 MG tablet Take 1 tablet (20 mg total) by mouth 4 (four) times daily. As needed for abdominal cramping 120 tablet 0  . gabapentin (NEURONTIN) 600 MG tablet Take 6 tablets (3,600 mg total) by mouth at bedtime. 240 tablet 5  . glucose blood test strip 4x daily 150 each 12  . insulin aspart (NOVOLOG FLEXPEN) 100 UNIT/ML FlexPen Inject 14 Units into the skin 3 (three) times daily with meals.    . Insulin Glargine (LANTUS SOLOSTAR) 100 UNIT/ML Solostar Pen Inject 35 Units into the skin daily. 15 mL 3  . metFORMIN (GLUCOPHAGE XR) 500 MG 24 hr tablet Take 2 tablets (1,000 mg total) by mouth 2 (two) times daily with a meal. 120 tablet 6  . metoprolol succinate (TOPROL-XL) 50 MG 24 hr tablet Take 1 tablet (50 mg total) by mouth daily. 30 tablet 6  . montelukast (SINGULAIR) 10 MG tablet Take 10 mg by mouth daily.    . nitroGLYCERIN (NITRODUR - DOSED IN MG/24 HR) 0.4 mg/hr patch Place 1 patch (0.4 mg total) onto the skin daily. 30 patch 6  . nortriptyline (PAMELOR) 50 MG capsule Take 1 capsule (50 mg total) by mouth at bedtime. 30 capsule 5  . pregabalin (LYRICA) 75  MG capsule Two pills daily  (150 mg) at bedtime for neuropathic pain 180 capsule 3  . nitroGLYCERIN (NITROSTAT) 0.4 MG SL tablet Place 0.4 mg under the tongue every 5 (five) minutes as needed for chest pain.    . ramipril (ALTACE) 5 MG capsule TAKE 2 CAPSULES BY MOUTH DAILY. 60 capsule 3   No current facility-administered medications for this visit.     Allergies  Allergen Reactions  . Ambien [Zolpidem Tartrate] Other (See Comments)    HALLUCINATIONS  . Shellfish Allergy Anaphylaxis    Seafood    Past Medical History:  Diagnosis Date  . Coronary artery disease   . Diabetes mellitus without complication ([place])   . Hypertension     Social History   Socioeconomic History  . Marital status: Married    Spouse name: Not on file  . Number of children: Not on  file  . Years of education: Not on file  . Highest education level: Not on file  Occupational History  . Not on file  Social Needs  . Financial resource strain: Not on file  . Food insecurity:    Worry: Not on file    Inability: Not on file  . Transportation needs:    Medical: Not on file    Non-medical: Not on file  Tobacco Use  . Smoking status: Never Smoker  . Smokeless tobacco: Never Used  Substance and Sexual Activity  . Alcohol use: Not Currently  . Drug use: Never  . Sexual activity: Yes  Lifestyle  . Physical activity:    Days per week: Not on file    Minutes per session: Not on file  . Stress: Not on file  Relationships  . Social connections:    Talks on phone: Not on file    Gets together: Not on file    Attends religious service: Not on file    Active member of club or organization: Not on file    Attends meetings of clubs or organizations: Not on file    Relationship status: Not on file  . Intimate partner violence:    Fear of current or ex partner: Not on file    Emotionally abused: Not on file    Physically abused: Not on file    Forced sexual activity: Not on file  Other Topics Concern  . Not on file  Social History Narrative   Wife and patient relocated to [place] from [place] a few months ago. - 06/11/18   He is on disability since 2019.     He lives with wife and daughter in a one-level apartment.      Family History  Problem Relation Age of Onset  . ALS Mother   . Lung cancer Father   . ALS Maternal Aunt   . Heart disease Paternal Grandmother   . Heart disease Paternal Grandfather      Review of Systems: General: negative for chills, fever, night sweats or weight changes.  Cardiovascular: negative for chest pain, dyspnea on exertion, edema, orthopnea, palpitations, paroxysmal nocturnal dyspnea or shortness of breath Dermatological: negative for rash Respiratory: negative for cough or wheezing Urologic: negative for  hematuria Abdominal: negative for nausea, vomiting, diarrhea, bright red blood per rectum, melena, or hematemesis Neurologic: negative for visual changes, syncope, or dizziness All other systems reviewed and are otherwise negative except as noted above.    Blood pressure 110/74, pulse 90, height _0  (1.88 m), weight 231 lb (104.8 kg), SpO2 97 %.  General appearance:  alert, cooperative and no distress Lungs: clear to auscultation bilaterally Heart: regular rate and rhythm Skin: Skin color, texture, turgor normal. No rashes or lesions Neurologic: Grossly normal   ASSESSMENT AND PLAN:   Palpitations Seen in the office today for f/u after ZIO  PSVT Runs of NSPSVT  Insulin dependent diabetes mellitus with complications ([place]) Pt has neuropathy  Dyslipidemia, goal LDL below 70 On Lipitor 40 mg  CAD S/P percutaneous coronary angioplasty S/P MI- PCI of unknown vessel in Fl 2013 Reportedly Myoview low risk Sept 2019 before moving to [facility]  HTN- controlled  PLAN  Increase Toprol to 50 mg daily  Kerin Ransom PA-C 10/22/2018 4:29 PM

## 2018-10-23 ENCOUNTER — Encounter: Payer: Self-pay | Admitting: Family Medicine

## 2018-10-25 MED FILL — GABAPENTIN 600 MG TABLET: 600 | 90 days supply | Qty: 540 | Fill #4

## 2018-10-28 ENCOUNTER — Other Ambulatory Visit: Payer: Self-pay | Admitting: Pharmacist

## 2018-10-28 DIAGNOSIS — I10 Essential (primary) hypertension: Secondary | ICD-10-CM

## 2018-10-28 MED ORDER — NORTRIPTYLINE HCL 50 MG PO CAPS: 50.0000 mg | ORAL_CAPSULE | Freq: Every day | ORAL | 0 refills | Status: DC

## 2018-10-28 MED ORDER — RAMIPRIL 5 MG PO CAPS: 10.0000 mg | ORAL_CAPSULE | Freq: Every day | ORAL | 0 refills | Status: DC

## 2018-10-28 MED ORDER — AMLODIPINE BESYLATE 10 MG PO TABS: 10.0000 mg | ORAL_TABLET | Freq: Every day | ORAL | 0 refills | Status: DC

## 2018-10-28 MED FILL — AMLODIPINE BESYLATE 10 MG T: 10 | 90 days supply | Qty: 90 | Fill #0

## 2018-10-28 MED FILL — METOPROLOL SUCCINATE ER 50: 50 | 90 days supply | Qty: 90 | Fill #0

## 2018-10-28 MED FILL — RAMIPRIL 5 MG CAPS: 5 | 90 days supply | Qty: 180 | Fill #0

## 2018-10-28 MED FILL — NORTRIPTYLINE HCL 50 MG CAP: 50 | 90 days supply | Qty: 90 | Fill #0

## 2018-10-28 NOTE — Progress Notes (Signed)
Patient requesting 90-day fill and was last seen in clinic 09/19/18. Will send in prescriptions.

## 2018-10-31 ENCOUNTER — Other Ambulatory Visit: Payer: Self-pay

## 2018-10-31 ENCOUNTER — Ambulatory Visit (HOSPITAL_BASED_OUTPATIENT_CLINIC_OR_DEPARTMENT_OTHER): Payer: Self-pay | Admitting: Licensed Clinical Social Worker

## 2018-10-31 ENCOUNTER — Ambulatory Visit: Payer: Self-pay | Attending: Family Medicine | Admitting: Family Medicine

## 2018-10-31 ENCOUNTER — Encounter: Payer: Self-pay | Admitting: Family Medicine

## 2018-10-31 VITALS — BP 122/83 | HR 86 | Temp 98.2°F | Resp 18 | Ht 74.0 in | Wt 231.0 lb

## 2018-10-31 DIAGNOSIS — F331 Major depressive disorder, recurrent, moderate: Secondary | ICD-10-CM

## 2018-10-31 DIAGNOSIS — Z794 Long term (current) use of insulin: Secondary | ICD-10-CM

## 2018-10-31 DIAGNOSIS — F419 Anxiety disorder, unspecified: Secondary | ICD-10-CM

## 2018-10-31 DIAGNOSIS — G5793 Unspecified mononeuropathy of bilateral lower limbs: Secondary | ICD-10-CM

## 2018-10-31 DIAGNOSIS — G894 Chronic pain syndrome: Secondary | ICD-10-CM

## 2018-10-31 DIAGNOSIS — E1142 Type 2 diabetes mellitus with diabetic polyneuropathy: Secondary | ICD-10-CM

## 2018-10-31 DIAGNOSIS — M79641 Pain in right hand: Secondary | ICD-10-CM

## 2018-10-31 DIAGNOSIS — M79642 Pain in left hand: Secondary | ICD-10-CM

## 2018-10-31 DIAGNOSIS — M62549 Muscle wasting and atrophy, not elsewhere classified, unspecified hand: Secondary | ICD-10-CM

## 2018-10-31 DIAGNOSIS — Z820 Family history of epilepsy and other diseases of the nervous system: Secondary | ICD-10-CM

## 2018-10-31 LAB — GLUCOSE, POCT (MANUAL RESULT ENTRY): POC Glucose: 302 mg/dL — AB (ref 70–99)

## 2018-10-31 MED ORDER — DICLOFENAC SODIUM 1 % TD GEL: 4.0000 g | Freq: Four times a day (QID) | TRANSDERMAL | 6 refills | Status: DC

## 2018-10-31 MED FILL — ATORVASTATIN CALCIUM 40 MG: 40 | 30 days supply | Qty: 30 | Fill #4

## 2018-10-31 MED FILL — DICLOFENAC SODIUM 1% GEL: 1 | 6 days supply | Qty: 100 | Fill #0

## 2018-10-31 NOTE — Progress Notes (Signed)
Established Patient Office Visit  Subjective:  Patient ID: Austin Vaughan, male    DOB: 05-May-1971  Age: 48 y.o. MRN: [id number]  CC:  Chief Complaint  Patient presents with  . Diabetes    HPI Austin Vaughan presents for neuropathic pain.  Patient reports that he continues to take at least 8 gabapentin at bedtime as well as Lyrica 150 mg and he still has difficulty sleeping due to the burning stinging as well as pins and needle sensation in his feet.  Patient states that he has pain in his hands and feet day and night however the pain is worse when he attempts to lie down to sleep.  Patient is accompanied by his wife at today's visit.  Patient and his wife recently moved to this area from [place].  Patient reports that over the past few years he has had decreased quality of life secondary to his chronic pain.  Patient states that in the past he and his wife and family will go to local theme parks in [place] and stay all day but the last time that they attempted to go to a theme park a few years ago patient had to leave shortly after arriving due to the amount of pain he was having.  Patient states that now he would be unable to go to a theme park unless he was in a wheelchair as patient states that he cannot walk or stand for long periods of time.  Patient feels that his pain is not adequately controlled with his current gabapentin and Lyrica and he continues to have to take high doses of Tylenol or over-the-counter medicines in addition to the prescription medications.      Patient also continues to have pain in his hands but also feels that his hands are weaker and he has less use of his hands.  Patient states that wearing arthritis gloves gives him some mild decrease in his hand pain.  Patient also over time has lost strength in his hands.  Patient can no longer open jars or cans due to loss of strength in his hands.  Patient also is afraid to try and pick up/hold objects as he tends to drop them.   Patient is seeing a neurologist as well, patient had his first visit in February.  Patient states that the neurologist also thinks that he has nerve damage in his elbows as well which may be contributing to the weakness and pain in his hands.  Patient is worried because he does have a strong family history of ALS and he wonders if any of his symptoms could be signs of onset of ALS.      Patient continues to try and control his blood sugars but he was told that this would not make much difference in his nerve pain.  Patient reports that he is being compliant with his medications.  Past Medical History:  Diagnosis Date  . Coronary artery disease   . Diabetes mellitus without complication ([place])   . Hypertension     Past Surgical History:  Procedure Laterality Date  . CORONARY ANGIOPLASTY WITH STENT PLACEMENT  07/2002    Family History  Problem Relation Age of Onset  . ALS Mother   . Lung cancer Father   . ALS Maternal Aunt   . Heart disease Paternal Grandmother   . Heart disease Paternal Grandfather     Social History   Tobacco Use  . Smoking status: Never Smoker  . Smokeless tobacco:  Never Used  Substance Use Topics  . Alcohol use: Not Currently  . Drug use: Never    Outpatient Medications Prior to Visit  Medication Sig Dispense Refill  . albuterol (PROAIR HFA) 108 (90 Base) MCG/ACT inhaler Inhale 2 puffs into the lungs every 6 (six) hours as needed for wheezing or shortness of breath.    [name] amLODipine (NORVASC) 10 MG tablet Take 1 tablet (10 mg total) by mouth daily. To lower blood pressure 90 tablet 0  . aspirin EC 81 MG tablet Take 81 mg by mouth daily.    [name] atorvastatin (LIPITOR) 40 MG tablet Take 1 tablet (40 mg total) by mouth daily. 30 tablet 5  . Blood Glucose Monitoring Suppl (TRUE METRIX METER) w/Device KIT Use daily to check blood sugar 4 times daily. 1 kit 0  . dicyclomine (BENTYL) 20 MG tablet Take 1 tablet (20 mg total) by mouth 4 (four) times daily. As needed for  abdominal cramping 120 tablet 0  . gabapentin (NEURONTIN) 600 MG tablet Take 6 tablets (3,600 mg total) by mouth at bedtime. 240 tablet 5  . glucose blood test strip 4x daily 150 each 12  . insulin aspart (NOVOLOG FLEXPEN) 100 UNIT/ML FlexPen Inject 14 Units into the skin 3 (three) times daily with meals.    . Insulin Glargine (LANTUS SOLOSTAR) 100 UNIT/ML Solostar Pen Inject 35 Units into the skin daily. 15 mL 3  . metFORMIN (GLUCOPHAGE XR) 500 MG 24 hr tablet Take 2 tablets (1,000 mg total) by mouth 2 (two) times daily with a meal. 120 tablet 6  . metoprolol succinate (TOPROL-XL) 50 MG 24 hr tablet Take 1 tablet (50 mg total) by mouth daily. 30 tablet 6  . montelukast (SINGULAIR) 10 MG tablet Take 10 mg by mouth daily.    . nitroGLYCERIN (NITRODUR - DOSED IN MG/24 HR) 0.4 mg/hr patch Place 1 patch (0.4 mg total) onto the skin daily. 30 patch 6  . nitroGLYCERIN (NITROSTAT) 0.4 MG SL tablet Place 0.4 mg under the tongue every 5 (five) minutes as needed for chest pain.    . nortriptyline (PAMELOR) 50 MG capsule Take 1 capsule (50 mg total) by mouth at bedtime. 90 capsule 0  . pregabalin (LYRICA) 75 MG capsule Two pills daily  (150 mg) at bedtime for neuropathic pain 180 capsule 3  . ramipril (ALTACE) 5 MG capsule Take 2 capsules (10 mg total) by mouth daily for 30 days. 180 capsule 0   No facility-administered medications prior to visit.     Allergies  Allergen Reactions  . Ambien [Zolpidem Tartrate] Other (See Comments)    HALLUCINATIONS  . Shellfish Allergy Anaphylaxis    Seafood    ROS Review of Systems  Constitutional: Positive for fatigue. Negative for chills and fever.  Respiratory: Negative for cough and shortness of breath.   Cardiovascular: Positive for palpitations (Patient with monitoring by cardiology). Negative for chest pain and leg swelling.  Gastrointestinal: Negative for abdominal pain, constipation and diarrhea.  Endocrine: Negative for polydipsia, polyphagia and  polyuria.  Genitourinary: Negative for dysuria and frequency.  Musculoskeletal: Positive for arthralgias, gait problem and myalgias.  Neurological: Positive for numbness. Negative for dizziness and headaches.  Hematological: Negative for adenopathy. Does not bruise/bleed easily.  Psychiatric/Behavioral: Positive for sleep disturbance. Negative for self-injury and suicidal ideas. The patient is nervous/anxious.       Objective:    Physical Exam  Constitutional: He appears well-developed and well-nourished.  Patient is accompanied by his wife at today's visit.  Patient is wearing compression gloves to both hands initially which he has worn at all prior visits but patient did take his gloves off at today's visit for musculoskeletal exam  Neck: Normal range of motion. Neck supple. No thyromegaly present.  Cardiovascular: Normal rate and regular rhythm.  No carotid bruit  Pulmonary/Chest: Effort normal and breath sounds normal.  Abdominal: Soft. There is no abdominal tenderness. There is no rebound and no guarding.  Musculoskeletal:       Arms:  Lymphadenopathy:    He has no cervical adenopathy.  Nursing note and vitals reviewed.   BP 122/83 (BP Location: Left Arm, Patient Position: Sitting, Cuff Size: Normal)   Pulse 86   Temp 98.2 F (36.8 C) (Oral)   Resp 18   Ht 6' 2"  (1.88 m)   Wt 231 lb (104.8 kg)   SpO2 98%   BMI 29.66 kg/m  Wt Readings from Last 3 Encounters:  10/31/18 231 lb (104.8 kg)  10/22/18 231 lb (104.8 kg)  10/17/18 229 lb 9.6 oz (104.1 kg)     Health Maintenance Due  Topic Date Due  . FOOT EXAM  01/20/1979  . OPHTHALMOLOGY EXAM  01/20/1979  . HIV Screening  01/20/1984      Lab Results  Component Value Date   TSH 0.96 10/17/2018   Lab Results  Component Value Date   WBC 6.5 08/22/2018   HGB 13.5 08/22/2018   HCT 40.5 08/22/2018   MCV 81 08/22/2018   PLT 355 08/22/2018   Lab Results  Component Value Date   NA 137 10/17/2018   K 4.0  10/17/2018   CO2 29 10/17/2018   GLUCOSE 307 (H) 10/17/2018   BUN 12 10/17/2018   CREATININE 0.73 10/17/2018   BILITOT 0.5 08/22/2018   ALKPHOS 149 (H) 08/22/2018   AST 12 08/22/2018   ALT 25 08/22/2018   PROT 6.9 08/22/2018   ALBUMIN 4.4 08/22/2018   CALCIUM 9.7 10/17/2018   ANIONGAP 10 06/04/2018   GFR 113.85 10/17/2018   Lab Results  Component Value Date   CHOL 179 07/24/2018   Lab Results  Component Value Date   HDL 48 07/24/2018   Lab Results  Component Value Date   LDLCALC 111 (H) 07/24/2018   Lab Results  Component Value Date   TRIG 100 07/24/2018   Lab Results  Component Value Date   CHOLHDL 3.7 07/24/2018   Lab Results  Component Value Date   HGBA1C 9.5 (A) 09/19/2018      Assessment & Plan:  1. Type 2 diabetes mellitus with diabetic polyneuropathy, with long-term current use of insulin ([place]) Patient is encouraged to continue to work at controlling his blood sugars to help prevent further long-term complications.  Patient's most recent hemoglobin A1c in February was 9.5 which was slightly improved from last A1c of 10.04 months prior - Glucose (CBG)  2. Neuropathic pain of both feet Patient with chronic neuropathic pain in both feet and patient is currently on high-dose gabapentin and Lyrica 150 mg at bedtime but patient still reports significant pain in his feet especially at night which interferes with sleep.  Patient is being referred to pain management for further evaluation and treatment - Ambulatory referral to Pain Clinic  3. Chronic pain syndrome Patient with chronic pain in his hands as well as chronic foot pain.  Patient's foot pain appears to be mostly due to neuropathy from diabetes but patient states that he may also be having other nerve type pain for which he  is being evaluated - Ambulatory referral to Pain Clinic  4. Bilateral hand pain Patient with complaint of chronic bilateral hand pain in addition to neuropathic pain in the feet.   Patient states that neurology feels that his symptoms in his hands are mostly related to the neuropathic pain in his feet however patient also has a very strong family history of ALS and patient has marked appearance of muscle wasting to both hands.  Patient is being referred to pain management regarding his chronic neuropathic pain in the hands and feet but I would also like patient to be evaluated by a hand specialist as well as an ALS specialist due to his strong family history and abnormal muscle wasting.  Prescription also provided for diclofenac gel to see if this helps with his hand pain.  Patient additionally is currently on gabapentin and Lyrica for neuropathic pain in his feet which causes patient to have difficulty with sleep - Ambulatory referral to Pain Clinic - Ambulatory referral to Hand Surgery - diclofenac sodium (VOLTAREN) 1 % GEL; Apply 4 g topically 4 (four) times daily. (2 gm to each hand four times per day as needed for pain)  Dispense: 2 Tube; Refill: 6  5. Atrophy of muscle of hand, unspecified laterality Patient is being referred to neurology and to a hand specialist secondary to bilateral muscle wasting, pain and weakness in the hands - Ambulatory referral to Neurology - Ambulatory referral to Hand Surgery  6. Family hx of ALS (amyotrophic lateral sclerosis) Patient with strong family history of ALS and patient will be referred to an ALS specialist for further evaluation - Ambulatory referral to Neurology  *Patient with an abnormal depression and anxiety screen which was discussed with the patient and his wife.  Patient denied any suicidal thoughts or ideations but does admit that due to his chronic pain and decreased quality of life he does feel anxious and he is concerned about his family.  Patient also has anxiety regarding possible ALS which runs in his family.  Patient agreed to speak with social worker, please see consultation report from social worker in patient's  chart.  An After Visit Summary was printed and given to the patient.  Follow-up: Return in about 3 months (around 01/31/2019) for DM.   [name], MD

## 2018-10-31 NOTE — Progress Notes (Signed)
0.2

## 2018-11-04 NOTE — BH Specialist Note (Signed)
Integrated Behavioral Health Initial Visit  MRN: [id number] Name: Austin Vaughan  Number of Integrated Behavioral Health Clinician visits:: 1/6 Session Start time: 4:00 PM  Session End time: 4:30 PM Total time: 30 minutes  Type of Service: Integrated Behavioral Health- Individual Interpretor:No. Interpretor Name and Language: NA   Warm Hand Off Completed.       SUBJECTIVE: Austin Vaughan is a 48 y.o. male accompanied by self Patient was referred by Dr. Jillyn Hidden for depression and anxiety. Patient reports the following symptoms/concerns: Pt has difficulty managing ongoing chronic conditions Duration of problem: Ongoing; Severity of problem: moderate  OBJECTIVE: Mood: Appropriate and Affect: Appropriate Risk of harm to self or others: No plan to harm self or others  LIFE CONTEXT: Family and Social: Pt receives strong support from partner School/Work: Pt receives disability and food stamps. He has Medicare and a Blue card Self-Care: Pt has been following up with medical appointments to better manage his physical conditions Life Changes: Pt has difficulty coping with ongoing medical conditions  GOALS ADDRESSED: Patient will: 1. Reduce symptoms of: anxiety and depression 2. Increase knowledge and/or ability of: coping skills and healthy habits  3. Demonstrate ability to: Increase healthy adjustment to current life circumstances and Increase adequate support systems for patient/family  INTERVENTIONS: Interventions utilized: Solution-Focused Strategies, Supportive Counseling, Psychoeducation and/or Health Education and Link to [employer]  Standardized Assessments completed: GAD-7 and PHQ 2&9  ASSESSMENT: Patient currently experiencing depression and anxiety triggered by ongoing medical conditions. Pt shared that he has been experiencing chronic pain which increases depression and anxiety symptoms. He receives strong support from family. Denies SI/HI.   Patient may benefit from  psychotherapy and medication management. LCSWA educated pt on the correlation between one's physical and mental health, in addition, to how stress can negatively impact health. Healthy coping skills were discussed to manage stressors and decrease symptoms. LCSWA informed pt that he is able to charge medications on an account due to active blue card to assist with financial strain. Supportive resources for food insecurity and behavioral health services were provided.   PLAN: 1. Follow up with behavioral health clinician on : Pt was encouraged to contact LCSWA if symptoms worsen or fail to improve to schedule behavioral appointments at [facility]. 2. Behavioral recommendations: LCSWA recommends that pt apply healthy coping skills discussed and utilize provided resources. Pt is encouraged to schedule follow up appointment with LCSWA 3. Referral(s): Integrated Art gallery manager (In Clinic) and [employer]:  Academic librarian 4. "From scale of 1-10, how likely are you to follow plan?":   Bridgett Larsson, LCSW 11/04/2018 3:32 PM

## 2018-11-19 ENCOUNTER — Encounter: Payer: Self-pay | Admitting: Family Medicine

## 2018-11-19 ENCOUNTER — Ambulatory Visit: Payer: Medicaid Other | Admitting: Family Medicine

## 2018-11-21 ENCOUNTER — Ambulatory Visit: Payer: Self-pay | Admitting: Family Medicine

## 2018-11-21 NOTE — Telephone Encounter (Signed)
Please fill for patient 

## 2018-11-25 ENCOUNTER — Encounter: Payer: Self-pay | Admitting: Internal Medicine

## 2018-11-25 ENCOUNTER — Encounter: Payer: Self-pay | Admitting: Family Medicine

## 2018-11-25 NOTE — Telephone Encounter (Signed)
Please advise on refill due to PCP being out of the office for an extended period and lead physician being out today.

## 2018-11-28 ENCOUNTER — Ambulatory Visit (INDEPENDENT_AMBULATORY_CARE_PROVIDER_SITE_OTHER): Payer: Medicaid Other | Admitting: Internal Medicine

## 2018-11-28 ENCOUNTER — Other Ambulatory Visit: Payer: Self-pay

## 2018-11-28 ENCOUNTER — Encounter: Payer: Self-pay | Admitting: Internal Medicine

## 2018-11-28 DIAGNOSIS — E1165 Type 2 diabetes mellitus with hyperglycemia: Secondary | ICD-10-CM

## 2018-11-28 DIAGNOSIS — E1159 Type 2 diabetes mellitus with other circulatory complications: Secondary | ICD-10-CM

## 2018-11-28 DIAGNOSIS — E1142 Type 2 diabetes mellitus with diabetic polyneuropathy: Secondary | ICD-10-CM

## 2018-11-28 DIAGNOSIS — E119 Type 2 diabetes mellitus without complications: Secondary | ICD-10-CM | POA: Insufficient documentation

## 2018-11-28 DIAGNOSIS — Z794 Long term (current) use of insulin: Secondary | ICD-10-CM

## 2018-11-28 MED ORDER — DAPAGLIFLOZIN PROPANEDIOL 5 MG PO TABS: 5.0000 mg | ORAL_TABLET | Freq: Every day | ORAL | 3 refills | Status: AC

## 2018-11-28 MED ORDER — INSULIN GLARGINE 100 UNIT/ML SOLOSTAR PEN: 38.0000 [IU] | PEN_INJECTOR | Freq: Every day | SUBCUTANEOUS | 3 refills | Status: DC

## 2018-11-28 MED ORDER — INSULIN ASPART 100 UNIT/ML FLEXPEN: 18.0000 [IU] | PEN_INJECTOR | Freq: Three times a day (TID) | SUBCUTANEOUS | 6 refills | Status: DC

## 2018-11-28 MED FILL — FARXIGA 5 MG TABLET: 5 | 30 days supply | Qty: 30 | Fill #0

## 2018-11-28 NOTE — Progress Notes (Signed)
Virtual Visit via Video Note  I connected with Austin Vaughan 11/28/18 at  9:30 AM EDT by a video enabled telemedicine application and verified that I am speaking with the correct person using two identifiers.   I discussed the limitations of evaluation and management by telemedicine and the availability of in person appointments. The patient expressed understanding and agreed to proceed.   -Location of the patient : Home  -Location of the provider : Office  -The names of all persons participating in the telemedicine service : CMA Lolita Rieger - Referring Provider : [name] , MD      PATIENT IDENTIFIER: [name] is a 48 y.o. male with a past medical history of T2DM, HTN, CAD. The patient has followed with Endocrinology clinic since 10/17/2018 for consultative assistance with management of his diabetes.  DIABETIC HISTORY:  Austin Vaughan was diagnosed with T2DM since 2013. He has tried Januvia in the past but was ineffective in controlling his hyperglycemia. He used to be on Metformin but unclear the reason for discontinuation.He has been on insulin ~ 2018. His hemoglobin A1c has ranged from  9.5% in 2020, peaking at 10.0% in 2019.  SUBJECTIVE:   During the last visit (10/17/2018): A1c was 9.5%. We continued Lantus at 35 units daily, increased Novolog to 16 units TID QAC, we started Metformin   Today (11/28/2018): Austin Vaughan is here for a 6 week virtual follow up on his diabetes management.  He checks his blood sugars 1-2times daily, preprandial to breakfast and supper The patient has not had hypoglycemic episodes since the last clinic visit. Otherwise, the patient has not required any recent emergency interventions for hypoglycemia and has not had recent hospitalizations secondary to hyper or hypoglycemic episodes.   He avoids sugar-sweetened beverages and rarely snacks.   He is being referred to pain management through his PCP.   He is compliant with lantus but admits to skipping on  Novolog and takes it on average once a day with breakfast   ROS: As per HPI and as detailed below: Review of Systems  Constitutional: Negative for fever.  HENT: Negative for congestion and sore throat.   Cardiovascular: Negative for chest pain and palpitations.  Gastrointestinal: Negative for diarrhea and nausea.      HOME DIABETES REGIMEN:   Lantus 35 units daily  NovoLog 16 units 3 times daily q. before meals  Metformin 500 mg XR 2 tablets twice daily with meals- take 2 tabs with Breakfast only       GLUCOSE LOG:  Date Breakfast  Supper Bedtime  11/28/18 192    4/14  250   4/13 410 291   4/12   300      HISTORY:  Past Medical History:  Past Medical History:  Diagnosis Date  . Coronary artery disease   . Diabetes mellitus without complication ([place])   . Hypertension     Past Surgical History:  Past Surgical History:  Procedure Laterality Date  . CORONARY ANGIOPLASTY WITH STENT PLACEMENT  07/2002     Social History:  reports that he has never smoked. He has never used smokeless tobacco. He reports previous alcohol use. He reports that he does not use drugs. Family History:  Family History  Problem Relation Age of Onset  . ALS Mother   . Lung cancer Father   . ALS Maternal Aunt   . Heart disease Paternal Grandmother   . Heart disease Paternal Grandfather       HOME  MEDICATIONS: Allergies as of 11/28/2018      Reactions   Ambien [zolpidem Tartrate] Other (See Comments)   HALLUCINATIONS   Shellfish Allergy Anaphylaxis   Seafood      Medication List       Accurate as of November 28, 2018  8:08 AM. Always use your most recent med list.        amLODipine 10 MG tablet Commonly known as:  NORVASC Take 1 tablet (10 mg total) by mouth daily. To lower blood pressure   aspirin EC 81 MG tablet Take 81 mg by mouth daily.   atorvastatin 40 MG tablet Commonly known as:  LIPITOR Take 1 tablet (40 mg total) by mouth daily.   diclofenac sodium 1 % Gel  Commonly known as:  VOLTAREN Apply 4 g topically 4 (four) times daily. (2 gm to each hand four times per day as needed for pain)   dicyclomine 20 MG tablet Commonly known as:  BENTYL Take 1 tablet (20 mg total) by mouth 4 (four) times daily. As needed for abdominal cramping   gabapentin 600 MG tablet Commonly known as:  NEURONTIN Take 6 tablets (3,600 mg total) by mouth at bedtime.   glucose blood test strip 4x daily   Insulin Glargine 100 UNIT/ML Solostar Pen Commonly known as:  Lantus SoloStar Inject 35 Units into the skin daily.   metFORMIN 500 MG 24 hr tablet Commonly known as:  Glucophage XR Take 2 tablets (1,000 mg total) by mouth 2 (two) times daily with a meal.   metoprolol succinate 50 MG 24 hr tablet Commonly known as:  Toprol XL Take 1 tablet (50 mg total) by mouth daily.   montelukast 10 MG tablet Commonly known as:  SINGULAIR Take 10 mg by mouth daily.   nitroGLYCERIN 0.4 MG SL tablet Commonly known as:  NITROSTAT Place 0.4 mg under the tongue every 5 (five) minutes as needed for chest pain.   nitroGLYCERIN 0.4 mg/hr patch Commonly known as:  NITRODUR - Dosed in mg/24 hr Place 1 patch (0.4 mg total) onto the skin daily.   nortriptyline 50 MG capsule Commonly known as:  PAMELOR Take 1 capsule (50 mg total) by mouth at bedtime.   NovoLOG FlexPen 100 UNIT/ML FlexPen Generic drug:  insulin aspart Inject 14 Units into the skin 3 (three) times daily with meals.   pregabalin 75 MG capsule Commonly known as:  Lyrica Two pills daily  (150 mg) at bedtime for neuropathic pain   ProAir HFA 108 (90 Base) MCG/ACT inhaler Generic drug:  albuterol Inhale 2 puffs into the lungs every 6 (six) hours as needed for wheezing or shortness of breath.   ramipril 5 MG capsule Commonly known as:  ALTACE Take 2 capsules (10 mg total) by mouth daily for 30 days.   True Metrix Meter w/Device Kit Use daily to check blood sugar 4 times daily.         DATA REVIEWED:   Lab Results  Component Value Date   HGBA1C 9.5 (A) 09/19/2018   HGBA1C 10.0 (A) 07/08/2018   Lab Results  Component Value Date   LDLCALC 111 (H) 07/24/2018   CREATININE 0.73 10/17/2018     Lab Results  Component Value Date   CHOL 179 07/24/2018   HDL 48 07/24/2018   LDLCALC 111 (H) 07/24/2018   TRIG 100 07/24/2018   CHOLHDL 3.7 07/24/2018         ASSESSMENT / PLAN / RECOMMENDATIONS:   1) Type 2 Diabetes Mellitus, Poorly controlled,  With neuropathic and macrovascular complications - Most recent A1c of 9.5 %. Goal A1c < 7.0 %.   Plan:  - His BG's continue to be above goal, pt is not checking glucose as frequently as advised. He also did not titrate Metformin to the maximum dose as previously instructed. He also is not taking prandial insulin on a regular basis.  - Pt advised to increase Metformin to a total of 4 tabs daily, discussed risk of nausea and other GI side effects and the importance of taking them with meals - We also discussed adding SGLT-2 inhibitors, we discussed risk of genital infections and gangrene, he was advised to do a visual check of the genital area should he have any discomfort. He is circumcised. We also discussed the benefits of improved glucose control, weight loss, lower BP and cardiovascular benefits.  - I have encouraged him to check BG's 4x a day for the next 2 weeks and send this data to our office so we could send a prescription for a CGM .   MEDICATIONS:  Increase Metformin 500 mg XR to 2 tabs with Breakfast and 2 tabs with supper  Start Farxiga 5 mg daily   Increase Lantus to 38 units daily   Increase Novolog to 18 units TID QAC  CF: Add 2 units of Novolog if pre-meal glucose is > 200 mg/dL.   EDUCATION / INSTRUCTIONS:  BG monitoring instructions: Patient is instructed to check his blood sugars 4 times a day, before meals and bedtime.  Call [facility] if: BG persistently < 70 or > 300. . I reviewed the Rule of 15  for the treatment of hypoglycemia in detail with the patient. Literature supplied.     I discussed the assessment and treatment plan with the patient. The patient was provided an opportunity to ask questions and all were answered. The patient agreed with the plan and demonstrated an understanding of the instructions.   The patient was advised to call back or seek an in-person evaluation if the symptoms worsen or if the condition fails to improve as anticipated.    F/U in 2 months     Signed electronically by: Mack Guise, MD  [facility]  [facility] [address] [place], [place] [address] Phone: [phone] FAX: [phone]   CC: [name], MD [place] [address] Phone: [phone]  Fax: [phone]  Return to Endocrinology clinic as below: Future Appointments  Date Time Provider [place]  11/28/2018  9:30 AM Torey Regan, Melanie Crazier, MD [facility] None  12/17/2018  1:30 PM Leandrew Koyanagi, MD [facility] None  12/31/2018  3:30 PM Anne Shutter, RD [facility]  01/07/2019 10:45 AM Letta Pate, Luanna Salk, MD [facility] None  02/03/2019  1:30 PM [name], MD [facility] None  03/21/2019 10:50 AM Narda Amber K, DO [facility] None

## 2018-11-29 ENCOUNTER — Encounter: Payer: Self-pay | Admitting: Family Medicine

## 2018-11-29 ENCOUNTER — Other Ambulatory Visit: Payer: Self-pay

## 2018-11-29 ENCOUNTER — Encounter: Payer: Self-pay | Admitting: Internal Medicine

## 2018-11-29 MED ORDER — INSULIN LISPRO (1 UNIT DIAL) 100 UNIT/ML (KWIKPEN): 18.0000 [IU] | PEN_INJECTOR | Freq: Three times a day (TID) | SUBCUTANEOUS | 11 refills | Status: DC

## 2018-11-29 MED ORDER — INSULIN LISPRO (1 UNIT DIAL) 100 UNIT/ML (KWIKPEN): PEN_INJECTOR | SUBCUTANEOUS | 11 refills | Status: DC

## 2018-11-29 MED FILL — ?HUMALOG 100 UNITS/ML KWIKP: 100 | 25 days supply | Qty: 15 | Fill #0

## 2018-12-02 NOTE — Telephone Encounter (Signed)
Please review patients mychart concern along with images and contact patient directly for advice and plan.

## 2018-12-04 ENCOUNTER — Other Ambulatory Visit: Payer: Self-pay | Admitting: Family Medicine

## 2018-12-04 ENCOUNTER — Encounter: Payer: Self-pay | Admitting: Family Medicine

## 2018-12-04 DIAGNOSIS — N528 Other male erectile dysfunction: Secondary | ICD-10-CM

## 2018-12-04 DIAGNOSIS — Z794 Long term (current) use of insulin: Secondary | ICD-10-CM

## 2018-12-04 DIAGNOSIS — E1165 Type 2 diabetes mellitus with hyperglycemia: Secondary | ICD-10-CM

## 2018-12-04 NOTE — Progress Notes (Signed)
Patient ID: Austin Vaughan, male   DOB: 07-06-1971, 48 y.o.   MRN: [id number]   Patient sent electronic My Chart message with the complaint of erectile dysfunction and would like to try a RX for Viagra or Cialis and reports that he was told in the past by his cardiologist in [place] that this was okay for him to take. Patient feels that he needs the medication as he has no sex drive as well as issues with sexual dysfunction that he believes is causing stress in his marriage. Message was sent to patient that RX would be sent in for him to try Viagra and for him to call/send message if this medication was not effective.   On review of patient's current medications he is on Imdur and he may not be a candidate for oral medications for ED treatment. Urology referral placed

## 2018-12-04 NOTE — Telephone Encounter (Signed)
Patient mychart concern, please respond directly to [employer].

## 2018-12-05 ENCOUNTER — Encounter: Payer: Self-pay | Admitting: Family Medicine

## 2018-12-06 NOTE — Telephone Encounter (Signed)
ALS concerns

## 2018-12-10 ENCOUNTER — Telehealth: Payer: Self-pay

## 2018-12-10 ENCOUNTER — Encounter: Payer: Self-pay | Admitting: Family Medicine

## 2018-12-10 NOTE — Telephone Encounter (Signed)
Pt wife stated understanding of instructions. Pt's blood sugars have been 180-230 she said a few have been higher but most within that range.

## 2018-12-10 NOTE — Telephone Encounter (Signed)
Pt wife Austin Vaughan called and stated that she recalls you stating that UTI may be a side effect of farxiga that you placed pt on during last visit. She stated that pt woke up this morning with strong smelling urine and that they are concerned, should he stop taking medication? Please advise

## 2018-12-11 ENCOUNTER — Encounter: Payer: Self-pay | Admitting: Family Medicine

## 2018-12-11 MED FILL — ?ATORVASTATIN 40MG TABLET: 40 | 30 days supply | Qty: 30 | Fill #5

## 2018-12-11 MED FILL — ?NITROGLYCERIN 0.4MG/HR PTC: 0.4 | 30 days supply | Qty: 30 | Fill #1

## 2018-12-11 MED FILL — METFORMIN HCL ER 500 MG TAB: 500 | 30 days supply | Qty: 120 | Fill #1

## 2018-12-16 ENCOUNTER — Encounter: Payer: Self-pay | Admitting: Family Medicine

## 2018-12-17 ENCOUNTER — Telehealth: Payer: Self-pay | Admitting: Family Medicine

## 2018-12-17 ENCOUNTER — Encounter: Payer: Self-pay | Admitting: Orthopaedic Surgery

## 2018-12-17 ENCOUNTER — Ambulatory Visit (INDEPENDENT_AMBULATORY_CARE_PROVIDER_SITE_OTHER): Payer: Self-pay | Admitting: Orthopaedic Surgery

## 2018-12-17 ENCOUNTER — Other Ambulatory Visit: Payer: Self-pay | Admitting: Family Medicine

## 2018-12-17 ENCOUNTER — Other Ambulatory Visit: Payer: Self-pay

## 2018-12-17 DIAGNOSIS — G629 Polyneuropathy, unspecified: Secondary | ICD-10-CM | POA: Insufficient documentation

## 2018-12-17 DIAGNOSIS — M79642 Pain in left hand: Secondary | ICD-10-CM

## 2018-12-17 DIAGNOSIS — M79641 Pain in right hand: Secondary | ICD-10-CM

## 2018-12-17 DIAGNOSIS — R0600 Dyspnea, unspecified: Secondary | ICD-10-CM

## 2018-12-17 MED ORDER — ALBUTEROL SULFATE HFA 108 (90 BASE) MCG/ACT IN AERS: 2.0000 | INHALATION_SPRAY | Freq: Four times a day (QID) | RESPIRATORY_TRACT | 11 refills | Status: DC | PRN

## 2018-12-17 MED FILL — !VENTOLIN HFA INHALER: 108 (90 BAS | 25 days supply | Qty: 18 | Fill #0

## 2018-12-17 NOTE — Telephone Encounter (Signed)
Called number on file and spoke with patient wife. Staff informed her with what provider stated and she verbalized understanding and agreed with advise.

## 2018-12-17 NOTE — Addendum Note (Signed)
Addended by: Albertina Parr on: 12/17/2018 02:36 PM   Modules accepted: Orders

## 2018-12-17 NOTE — Progress Notes (Signed)
Patient ID: Austin Vaughan, male   DOB: [birth year], 48 y.o.   MRN: [id number]   Message received that patient has been trying to receive his albuterol inhaler but has been unable to do so. I will place a order for this medication along with a note to the pharmacy

## 2018-12-17 NOTE — Progress Notes (Signed)
Office Visit Note   Patient: Austin Vaughan           Date of Birth: 1971-02-24           MRN: [id number] Visit Date: 12/17/2018              Requested by: Cain Saupe, MD [address] [place], [place] [address] PCP: Cain Saupe, MD   Assessment & Plan: Visit Diagnoses:  1. Bilateral hand pain     Plan: Impression is bilateral hand pain likely from diabetic polyneuropathy.  We will obtain a nerve conduction study/EMG for further evaluation.  He will follow-up with [place] once that has been completed.  Follow-Up Instructions: Return in about 3 weeks (around 01/07/2019) for to discuss NCS/EMG BUE.   Orders:  No orders of the defined types were placed in this encounter.  No orders of the defined types were placed in this encounter.     Procedures: No procedures performed   Clinical Data: No additional findings.   Subjective: Chief Complaint  Patient presents with  . Left Hand - Pain, Numbness  . Right Hand - Pain, Numbness    HPI patient is a pleasant right-hand-dominant 48 year old gentleman who presents our clinic today with bilateral hand pain, numbness and tingling.  This is been ongoing for the past 4 months and has progressively worsened.  No known injury or change in activity.  Of note, he is a diabetic with the last hemoglobin A1c of 9.53 months ago.  The pain and decreased sensation that he has is to the tips of his fingers and palm of his hand.  There is no specific trigger.  He notes that he has started to drop things as well.  He has been seen by his diabetic doctor as well as neurologist for this and states that they have told him there is nothing else to do.  He has been referred by them to a pain clinic.  He takes gabapentin and Tylenol.  He wears copper gloves.  He states that he has never had a nerve conduction study.  Review of Systems as detailed in HPI.  All others reviewed and are negative.   Objective: Vital Signs: There were no vitals taken for  this visit.  Physical Exam well-developed well-nourished gentleman no acute distress.  Alert and oriented x3.  Ortho Exam examination of both hands reveals no thenar atrophy.  Both hands have equal sensation.  Full range of motion.  Fingers are warm and well-perfused.  Specialty Comments:  No specialty comments available.  Imaging: No new imaging   PMFS History: Patient Active Problem List   Diagnosis Date Noted  . Neuropathy 12/17/2018  . Diabetes mellitus (HCC) 11/28/2018  . Type 2 diabetes mellitus with hyperglycemia, with long-term current use of insulin (HCC) 11/28/2018  . Type 2 diabetes mellitus with diabetic polyneuropathy, with long-term current use of insulin (HCC) 11/28/2018  . PSVT (paroxysmal supraventricular tachycardia) (HCC) 10/22/2018  . Diabetic polyneuropathy associated with type 2 diabetes mellitus (HCC) 09/20/2018  . Ulnar neuropathy of both upper extremities 09/20/2018  . Palpitations 09/17/2018  . CAD S/P percutaneous coronary angioplasty 09/17/2018  . Dyslipidemia, goal LDL below 70 09/17/2018  . Insulin dependent diabetes mellitus with complications (HCC) 07/26/2018  . Painful diabetic neuropathy (HCC) 07/26/2018  . Hypertension    Past Medical History:  Diagnosis Date  . Coronary artery disease   . Diabetes mellitus without complication (HCC)   . Hypertension     Family History  Problem Relation Age of Onset  . ALS Mother   . Lung cancer Father   . ALS Maternal Aunt   . Heart disease Paternal Grandmother   . Heart disease Paternal Grandfather     Past Surgical History:  Procedure Laterality Date  . CORONARY ANGIOPLASTY WITH STENT PLACEMENT  07/2002   Social History   Occupational History  . Not on file  Tobacco Use  . Smoking status: Never Smoker  . Smokeless tobacco: Never Used  Substance and Sexual Activity  . Alcohol use: Not Currently  . Drug use: Never  . Sexual activity: Yes

## 2018-12-17 NOTE — Telephone Encounter (Signed)
Please notify patient that new RX for albuterol was sent in for the patient and message on RX for pharmacy to contact patient but he should also call the pharmacy as well to see if he or wife can pick up medication today or tomorrow

## 2018-12-17 NOTE — Telephone Encounter (Signed)
New Message   Pt states he has been trying to get his albuterol (PROAIR HFA) 108 (90 Base) MCG/ACT inhaler but has not received the medication yet. Please f/u

## 2018-12-18 NOTE — Telephone Encounter (Signed)
Patient mychart concern.

## 2018-12-30 ENCOUNTER — Encounter: Payer: Self-pay | Admitting: Family Medicine

## 2018-12-30 NOTE — Telephone Encounter (Signed)
Mychart concern

## 2018-12-31 ENCOUNTER — Ambulatory Visit: Payer: Medicaid Other | Admitting: *Deleted

## 2018-12-31 ENCOUNTER — Telehealth: Payer: Self-pay | Admitting: Neurology

## 2018-12-31 ENCOUNTER — Other Ambulatory Visit: Payer: Self-pay | Admitting: Family Medicine

## 2018-12-31 DIAGNOSIS — G894 Chronic pain syndrome: Secondary | ICD-10-CM

## 2018-12-31 DIAGNOSIS — G5793 Unspecified mononeuropathy of bilateral lower limbs: Secondary | ICD-10-CM

## 2018-12-31 NOTE — Telephone Encounter (Signed)
[facility] called Threasa Alpha) regarding patient and his Diabetic Neuropathy and that he is having hand and Muscle Weakness. She said he burned his hand the other day. He is wanting a sooner appointment than August. Can he have an E visit or In office?  Thanks

## 2018-12-31 NOTE — Telephone Encounter (Signed)
OK for e-visit.  

## 2018-12-31 NOTE — Progress Notes (Signed)
Patient ID: Austin Vaughan, male   DOB: 1970-12-05, 48 y.o.   MRN: [id number]    Result my chart message received that patient still had not heard back from pain management referral previously placed.  I sent a message to referral coordinator and patient is still currently uninsured which has limited options for pain management.  It was suggested that a new referral be placed.  I will place a new referral to pain management but patient was also sent a response that he can call [facility] directly regarding their prices for pain management appointments and if he would like new referral to [facility] based on the prices, he can call for referral if this is needed

## 2018-12-31 NOTE — Telephone Encounter (Signed)
Please place referral for pain clinic. MA spoke with [facility] and [facility]. Patient is self pay right now due to medicare only being hospital coverage and medicaid being family planning. Patient will need to be self pay at a pain clinic as well while he is trying to get full coverage medicaid.

## 2019-01-02 LAB — HM DIABETES EYE EXAM

## 2019-01-02 MED FILL — FARXIGA 5 MG TABLET: 5 | 30 days supply | Qty: 30 | Fill #1

## 2019-01-03 ENCOUNTER — Encounter: Payer: Self-pay | Admitting: Family Medicine

## 2019-01-07 ENCOUNTER — Encounter: Payer: Self-pay | Admitting: Family Medicine

## 2019-01-07 ENCOUNTER — Encounter: Payer: Self-pay | Admitting: Physical Medicine & Rehabilitation

## 2019-01-07 ENCOUNTER — Encounter: Payer: Self-pay | Admitting: Internal Medicine

## 2019-01-07 DIAGNOSIS — E1140 Type 2 diabetes mellitus with diabetic neuropathy, unspecified: Secondary | ICD-10-CM

## 2019-01-07 DIAGNOSIS — G894 Chronic pain syndrome: Secondary | ICD-10-CM

## 2019-01-07 MED FILL — $LANTUS SOLOSTAR 100 UNITS/: 100 | 31 days supply | Qty: 12 | Fill #0

## 2019-01-07 NOTE — Telephone Encounter (Signed)
Mychart concern

## 2019-01-07 NOTE — Telephone Encounter (Signed)
Patient requesting a referral due to not passing a national hearing test via mychart request

## 2019-01-08 ENCOUNTER — Telehealth: Payer: Self-pay

## 2019-01-08 MED ORDER — PREGABALIN 75 MG PO CAPS: 75.0000 mg | ORAL_CAPSULE | Freq: Three times a day (TID) | ORAL | 1 refills | Status: DC

## 2019-01-08 NOTE — Telephone Encounter (Signed)
Spoke to patients emergency contact, patient is currently waiting on medicaid application to go through and is on a new medication for pain. Does not need any further assistance at this time. Advised to reach out if further assistance is needed.

## 2019-01-16 MED FILL — ?ATORVASTATIN 40MG TABLET: 40 | 30 days supply | Qty: 30 | Fill #1

## 2019-01-17 ENCOUNTER — Encounter: Payer: Medicaid Other | Admitting: Physical Medicine and Rehabilitation

## 2019-01-20 ENCOUNTER — Encounter: Payer: Self-pay | Admitting: Family Medicine

## 2019-01-20 ENCOUNTER — Encounter: Payer: Self-pay | Admitting: Internal Medicine

## 2019-01-20 ENCOUNTER — Other Ambulatory Visit: Payer: Self-pay | Admitting: Internal Medicine

## 2019-01-20 MED ORDER — METFORMIN HCL 1000 MG PO TABS: 1000.0000 mg | ORAL_TABLET | Freq: Two times a day (BID) | ORAL | 3 refills | Status: DC

## 2019-01-20 MED FILL — metFORMIN HCL 1000 MG TABS: 1000 | 30 days supply | Qty: 60 | Fill #0

## 2019-01-21 NOTE — Telephone Encounter (Signed)
Please reach out to patient regarding the change

## 2019-01-22 ENCOUNTER — Ambulatory Visit: Payer: Medicaid Other | Admitting: Orthopaedic Surgery

## 2019-01-23 ENCOUNTER — Ambulatory Visit: Payer: Medicaid Other | Admitting: Registered"

## 2019-01-27 ENCOUNTER — Encounter: Payer: Self-pay | Admitting: Internal Medicine

## 2019-01-27 ENCOUNTER — Other Ambulatory Visit: Payer: Self-pay | Admitting: Family Medicine

## 2019-01-27 DIAGNOSIS — I10 Essential (primary) hypertension: Secondary | ICD-10-CM

## 2019-01-27 MED FILL — RAMIPRIL 5 MG CAPS: 5 | 30 days supply | Qty: 60 | Fill #0

## 2019-01-27 MED FILL — FARXIGA 5 MG TABLET: 5 | 30 days supply | Qty: 30 | Fill #2

## 2019-01-27 MED FILL — ?AMLODIPINE BESYLATE 10 MG: 10 | 30 days supply | Qty: 30 | Fill #0

## 2019-01-27 MED FILL — GABAPENTIN 600 MG TABLET: 600 | 30 days supply | Qty: 180 | Fill #5

## 2019-01-27 MED FILL — ?METOPROLOL SUCC ER 50MG TA: 50 | 30 days supply | Qty: 30 | Fill #1

## 2019-01-28 ENCOUNTER — Encounter: Payer: Self-pay | Admitting: Internal Medicine

## 2019-01-28 ENCOUNTER — Ambulatory Visit (INDEPENDENT_AMBULATORY_CARE_PROVIDER_SITE_OTHER): Payer: Medicaid Other | Admitting: Internal Medicine

## 2019-01-28 ENCOUNTER — Other Ambulatory Visit: Payer: Self-pay

## 2019-01-28 DIAGNOSIS — E1165 Type 2 diabetes mellitus with hyperglycemia: Secondary | ICD-10-CM

## 2019-01-28 DIAGNOSIS — Z794 Long term (current) use of insulin: Secondary | ICD-10-CM

## 2019-01-28 DIAGNOSIS — E1159 Type 2 diabetes mellitus with other circulatory complications: Secondary | ICD-10-CM

## 2019-01-28 DIAGNOSIS — E1142 Type 2 diabetes mellitus with diabetic polyneuropathy: Secondary | ICD-10-CM

## 2019-01-28 MED ORDER — FARXIGA 10 MG PO TABS: 10.0000 mg | ORAL_TABLET | Freq: Every day | ORAL | 6 refills | Status: DC

## 2019-01-28 MED ORDER — LANTUS SOLOSTAR 100 UNIT/ML ~~LOC~~ SOPN: 34.0000 [IU] | PEN_INJECTOR | Freq: Every day | SUBCUTANEOUS | 3 refills | Status: DC

## 2019-01-28 MED ORDER — METFORMIN HCL 1000 MG PO TABS: 1000.0000 mg | ORAL_TABLET | Freq: Two times a day (BID) | ORAL | 3 refills | Status: DC

## 2019-01-28 NOTE — Progress Notes (Signed)
Virtual Visit via Video Note  I connected with Austin Vaughan 01/28/19 at  9:30 AM EDT by a video enabled telemedicine application and verified that I am speaking with the correct person using two identifiers.   I discussed the limitations of evaluation and management by telemedicine and the availability of in person appointments. The patient expressed understanding and agreed to proceed.   -Location of the patient : Home  -Location of the provider : Office  -The names of all persons participating in the telemedicine service : CMA Lolita Rieger - Referring Provider : [name] , MD      PATIENT IDENTIFIER: Austin Vaughan is a 48 y.o. male with a past medical history of T2DM, HTN, CAD. The patient has followed with Endocrinology clinic since 10/17/2018 for consultative assistance with management of his diabetes.  DIABETIC HISTORY:  Austin Vaughan was diagnosed with T2DM since 2013. He has tried Januvia in the past but was ineffective in controlling his hyperglycemia. He used to be on Metformin but unclear the reason for discontinuation.He has been on insulin ~ 2018. His hemoglobin A1c has ranged from  9.5% in 2020, peaking at 10.0% in 2019.  SUBJECTIVE:   During the last visit (11/28/2018): We increased metformin , started farxiga at 5 mg, increased lantus and novolog as well.      Today (01/28/2019): Austin Vaughan is here for a 6 week virtual follow up on his diabetes management.  He checks his blood sugars 1-2 times daily, preprandial to breakfast and supper The patient has not had hypoglycemic episodes since the last clinic visit. Otherwise, the patient has not required any recent emergency interventions for hypoglycemia and has not had recent hospitalizations secondary to hyper or hypoglycemic episodes.   He avoids sugar-sweetened beverages and rarely snacks.   He is being referred to pain management through his PCP.   He denies any genital infections while on farxiga.   Due to metformin  recall on ER , this was switched to regular release but he continued to take 2 tabs in am and 1 with supper.   He has been eating a lot of fruits as snacks.   ROS: As per HPI and as detailed below: Review of Systems  Constitutional: Negative for fever.  HENT: Negative for congestion and sore throat.   Cardiovascular: Negative for chest pain and palpitations.  Gastrointestinal: Negative for diarrhea and nausea.      HOME DIABETES REGIMEN:   Lantus 38 units daily  NovoLog 18 units 3 times daily q. before meals  Metformin 1000 mg BID- has been taking 3 a day following the same dosing of extended release   Farxiga 5 mg daily       GLUCOSE LOG:  Date Breakfast  Lunch Bedtime  01/28/2019 230    6/15   131  6/14  170   6/8   232      HISTORY:  Past Medical History:  Past Medical History:  Diagnosis Date  . Coronary artery disease   . Diabetes mellitus without complication ([place])   . Hypertension    Past Surgical History:  Past Surgical History:  Procedure Laterality Date  . CORONARY ANGIOPLASTY WITH STENT PLACEMENT  07/2002    Social History:  reports that he has never smoked. He has never used smokeless tobacco. He reports previous alcohol use. He reports that he does not use drugs. Family History:  Family History  Problem Relation Age of Onset  . ALS Mother   .  Lung cancer Father   . ALS Maternal Aunt   . Heart disease Paternal Grandmother   . Heart disease Paternal Grandfather      HOME MEDICATIONS: Allergies as of 01/28/2019      Reactions   Ambien [zolpidem Tartrate] Other (See Comments)   HALLUCINATIONS   Shellfish Allergy Anaphylaxis   Seafood      Medication List       Accurate as of January 28, 2019  8:10 AM. If you have any questions, ask your nurse or doctor.        albuterol 108 (90 Base) MCG/ACT inhaler Commonly known as: ProAir HFA Inhale 2 puffs into the lungs every 6 (six) hours as needed for wheezing or shortness of breath.    amLODipine 10 MG tablet Commonly known as: NORVASC TAKE 1 TABLET (10 MG TOTAL) BY MOUTH DAILY. TO LOWER BLOOD PRESSURE   aspirin EC 81 MG tablet Take 81 mg by mouth daily.   atorvastatin 40 MG tablet Commonly known as: LIPITOR Take 1 tablet (40 mg total) by mouth daily.   diclofenac sodium 1 % Gel Commonly known as: VOLTAREN Apply 4 g topically 4 (four) times daily. (2 gm to each hand four times per day as needed for pain)   dicyclomine 20 MG tablet Commonly known as: BENTYL Take 1 tablet (20 mg total) by mouth 4 (four) times daily. As needed for abdominal cramping   gabapentin 600 MG tablet Commonly known as: NEURONTIN Take 6 tablets (3,600 mg total) by mouth at bedtime.   glucose blood test strip 4x daily   Insulin Glargine 100 UNIT/ML Solostar Pen Commonly known as: Lantus SoloStar Inject 38 Units into the skin daily.   insulin lispro 100 UNIT/ML KwikPen Commonly known as: HumaLOG KwikPen Inject 18 units into skin 3 times daily with ,eals. Max dosage 60 units daily. DX E11.65   metFORMIN 1000 MG tablet Commonly known as: GLUCOPHAGE Take 1 tablet (1,000 mg total) by mouth 2 (two) times daily with a meal.   metoprolol succinate 50 MG 24 hr tablet Commonly known as: Toprol XL Take 1 tablet (50 mg total) by mouth daily.   montelukast 10 MG tablet Commonly known as: SINGULAIR Take 10 mg by mouth daily.   nitroGLYCERIN 0.4 MG SL tablet Commonly known as: NITROSTAT Place 0.4 mg under the tongue every 5 (five) minutes as needed for chest pain.   nitroGLYCERIN 0.4 mg/hr patch Commonly known as: NITRODUR - Dosed in mg/24 hr Place 1 patch (0.4 mg total) onto the skin daily.   nortriptyline 50 MG capsule Commonly known as: PAMELOR Take 1 capsule (50 mg total) by mouth at bedtime.   pregabalin 75 MG capsule Commonly known as: Lyrica Two pills daily  (150 mg) at bedtime for neuropathic pain   pregabalin 75 MG capsule Commonly known as: LYRICA Take 1 capsule (75  mg total) by mouth 3 (three) times daily.   ramipril 5 MG capsule Commonly known as: ALTACE TAKE 2 CAPSULES (10 MG TOTAL) BY MOUTH DAILY.   True Metrix Meter w/Device Kit Use daily to check blood sugar 4 times daily.         DATA REVIEWED:  Lab Results  Component Value Date   HGBA1C 9.5 (A) 09/19/2018   HGBA1C 10.0 (A) 07/08/2018   Lab Results  Component Value Date   LDLCALC 111 (H) 07/24/2018   CREATININE 0.73 10/17/2018     Lab Results  Component Value Date   CHOL 179 07/24/2018   HDL 48  07/24/2018   LDLCALC 111 (H) 07/24/2018   TRIG 100 07/24/2018   CHOLHDL 3.7 07/24/2018         ASSESSMENT / PLAN / RECOMMENDATIONS:   1) Type 2 Diabetes Mellitus, Poorly controlled, With neuropathic and macrovascular complications - Most recent A1c of 9.5 %. Goal A1c < 7.0 %.   Plan:  - His BG's have improved tremendously with the current changes. His glucose variability is due to insulin-CHO mismatch, he is scheduled to see our CDE which is going to be helpful for him, as he has been snacking on lots of fruits thinking this will not affect his glucose.  - He denies any side effects to [place]  - He was instructed to read the instruction on the bottles from now on, as he continued to take regular release metformin 1000 mg in the same manner as his 500 mg ER tablets. Pt will reduce this.    MEDICATIONS:  Metformin 1000 mg BID  Increase Farxiga 10 mg daily   Decrease Lantus to 34 units daily   Continue  Novolog 18 units TID QAC  EDUCATION / INSTRUCTIONS:  BG monitoring instructions: Patient is instructed to check his blood sugars 4 times a day, before meals and bedtime.  Call [facility] if: BG persistently < 70 or > 300. . I reviewed the Rule of 15 for the treatment of hypoglycemia in detail with the patient. Literature supplied.     I discussed the assessment and treatment plan with the patient. The patient was provided an opportunity to ask  questions and all were answered. The patient agreed with the plan and demonstrated an understanding of the instructions.   The patient was advised to call back or seek an in-person evaluation if the symptoms worsen or if the condition fails to improve as anticipated.    F/U in 3 months     Signed electronically by: Mack Guise, MD  [facility] Endocrinology  [facility] [address] [place], [place] [address] Phone: [phone] FAX: [phone]   CC: [name], MD [place] [address] Phone: [phone]  Fax: [phone]  Return to Endocrinology clinic as below: Future Appointments  Date Time Provider [place]  01/28/2019  9:30 AM Laryah Neuser, Melanie Crazier, MD [facility] None  02/03/2019  1:30 PM [name], MD [facility] None  02/10/2019  1:00 PM Kirsteins, Luanna Salk, MD [facility] CPR  02/25/2019  2:30 PM Christella Hartigan, RD [facility]  03/21/2019 10:50 AM Narda Amber K, DO [facility] None  04/24/2019  2:20 PM Skeet Latch, MD [facility]

## 2019-01-29 ENCOUNTER — Other Ambulatory Visit: Payer: Self-pay | Admitting: Family Medicine

## 2019-01-29 DIAGNOSIS — G5793 Unspecified mononeuropathy of bilateral lower limbs: Secondary | ICD-10-CM

## 2019-01-29 MED ORDER — PREGABALIN 75 MG PO CAPS: 75.0000 mg | ORAL_CAPSULE | Freq: Three times a day (TID) | ORAL | 1 refills | Status: DC

## 2019-01-29 NOTE — Progress Notes (Signed)
Patient ID: Austin Vaughan, male   DOB: 25-Nov-1970, 48 y.o.   MRN: [id number]   Printed RX needed for patient assistance program for patient's Lyrica 75 mg, three times per day with 1 refill

## 2019-02-03 ENCOUNTER — Other Ambulatory Visit: Payer: Self-pay

## 2019-02-03 ENCOUNTER — Other Ambulatory Visit: Payer: Self-pay | Admitting: Family Medicine

## 2019-02-03 ENCOUNTER — Encounter: Payer: Self-pay | Admitting: Family Medicine

## 2019-02-03 ENCOUNTER — Ambulatory Visit: Payer: Self-pay | Attending: Family Medicine | Admitting: Family Medicine

## 2019-02-03 DIAGNOSIS — Z794 Long term (current) use of insulin: Secondary | ICD-10-CM

## 2019-02-03 DIAGNOSIS — G894 Chronic pain syndrome: Secondary | ICD-10-CM

## 2019-02-03 DIAGNOSIS — G5793 Unspecified mononeuropathy of bilateral lower limbs: Secondary | ICD-10-CM

## 2019-02-03 DIAGNOSIS — F32A Depression, unspecified: Secondary | ICD-10-CM

## 2019-02-03 DIAGNOSIS — F329 Major depressive disorder, single episode, unspecified: Secondary | ICD-10-CM

## 2019-02-03 DIAGNOSIS — F419 Anxiety disorder, unspecified: Secondary | ICD-10-CM

## 2019-02-03 DIAGNOSIS — E1142 Type 2 diabetes mellitus with diabetic polyneuropathy: Secondary | ICD-10-CM

## 2019-02-03 MED ORDER — PREGABALIN 75 MG PO CAPS: 75.0000 mg | ORAL_CAPSULE | Freq: Three times a day (TID) | ORAL | 2 refills | Status: DC

## 2019-02-03 MED ORDER — LANTUS SOLOSTAR 100 UNIT/ML ~~LOC~~ SOPN: 34.0000 [IU] | PEN_INJECTOR | Freq: Every day | SUBCUTANEOUS | 1 refills | Status: DC

## 2019-02-03 MED ORDER — INSULIN LISPRO (1 UNIT DIAL) 100 UNIT/ML (KWIKPEN): PEN_INJECTOR | SUBCUTANEOUS | 11 refills | Status: DC

## 2019-02-03 MED FILL — $LANTUS SOLOSTAR 100 UNITS/: 100 | 31 days supply | Qty: 12 | Fill #1

## 2019-02-03 MED FILL — ?HUMALOG 100 UNITS/ML KWIKP: 100 | 25 days supply | Qty: 15 | Fill #1

## 2019-02-03 MED FILL — !VENTOLIN HFA INHALER: 108 (90 BAS | 25 days supply | Qty: 18 | Fill #1

## 2019-02-03 NOTE — Progress Notes (Signed)
Med refill   Hands and feet hurts

## 2019-02-03 NOTE — Progress Notes (Signed)
Virtual Visit via Telephone Note  I connected with on 02/03/19 at  1:30 PM EDT by telephone and verified that I am speaking with the correct person using two identifiers.   I discussed the limitations, risks, security and privacy concerns of performing an evaluation and management service by telephone and the availability of in person appointments. I also discussed with the patient that there may be a patient responsible charge related to this service. The patient expressed understanding and agreed to proceed.  Patient Location: Home Provider Location: Office Others participating in call: Call initiated by Emilio Aspen, RMA   History of Present Illness:      48 year old male with history of difficult to control diabetes with polyneuropathy who reports that he needs refills of his medications for diabetes as well as his neuropathy.  He reports that he did see a specialist about his hands and was told that he needed to have nerve conduction studies however he was also told that this would be $5000 out-of-pocket which he cannot afford.  He has not yet received any information regarding referral to neurology/ALS specialist at [facility] as patient has a strong family history of ALS.  He also states that he has not heard anything regarding pain management referral.  He has been told that he will be getting Medicare part B and states that he recently received approval for an orange card through this office.        He reports that he does follow-up with his endocrinologist and at his last visit, he was told to take Lantus at 34 units daily and states that he was told that this was the maximum that he could take along with metformin.  He does report that he is also taking the Jardiance that was prescribed by endocrinology.  He requests refill of Lantus 34 units and Humalog 18 units 3 times daily before meals.  He states that his lowest blood sugar has been 148 but blood sugars have also been as high as 300.   He reports that he continues to have issues with his blood sugars not staying controlled.  He reports that he has been eating a healthy diet and drinking plenty of water each day.  He attributes urinary frequency to his increased water intake.  He denies any chest pain or palpitations, no shortness of breath or cough, no recent fever or chills.  No current abdominal pain-no nausea or vomiting.       He continues to have issues with pain in his feet as well as hands.  He states that his dose of Lyrica was recently increased to 3 pills at bedtime by his neurologist.  He continues however to have excruciating pain in his feet especially at bedtime and he sometimes takes up to 6 ibuprofen along with the Lyrica to help with his pain.  He also wears special socks and special gloves on his hands and sometimes uses a small heater that also helps to make his feet feel better.       He denies any suicidal thoughts or ideations but does have some anxiety as well as depressed mood regarding his chronic medical issues.  He reports that he has not yet seen a psychiatrist or other mental health provider in follow-up of his mood.   Past Medical History:  Diagnosis Date  . Coronary artery disease   . Diabetes mellitus without complication ([place])   . Hypertension     Past Surgical History:  Procedure Laterality Date  .  CORONARY ANGIOPLASTY WITH STENT PLACEMENT  07/2002    Family History  Problem Relation Age of Onset  . ALS Mother   . Lung cancer Father   . ALS Maternal Aunt   . Heart disease Paternal Grandmother   . Heart disease Paternal Grandfather     Social History   Tobacco Use  . Smoking status: Never Smoker  . Smokeless tobacco: Never Used  Substance Use Topics  . Alcohol use: Not Currently  . Drug use: Never     Allergies  Allergen Reactions  . Ambien [Zolpidem Tartrate] Other (See Comments)    HALLUCINATIONS  . Shellfish Allergy Anaphylaxis    Seafood        Observations/Objective: No vital signs or physical exam conducted as visit was done via telephone  Assessment and Plan: 1. Type 2 diabetes mellitus with diabetic polyneuropathy, with long-term current use of insulin (HCC) Patient is encouraged to continue follow-up with his endocrinologist.  Patient's most recent endocrinology note was reviewed and his Lantus was decreased to 34 units but there was no mention of this being the maximum dose that he could take.  Patient however does not wish to further titrate his Lantus dose due to his belief that his endocrinologist told him not to do so.  Patient is encouraged to contact his endocrinologist regarding the fact that his blood sugars remain uncontrolled.  Refills provided of Lantus and Humalog - Insulin Glargine (LANTUS SOLOSTAR) 100 UNIT/ML Solostar Pen; Inject 34 Units into the skin daily.  Dispense: 15 mL; Refill: 1 - insulin lispro (HUMALOG KWIKPEN) 100 UNIT/ML KwikPen; Inject 18 units into skin 3 times daily with ,eals. Max dosage 60 units daily. DX E11.65  Dispense: 15 mL; Refill: 11  2. Neuropathic pain of both feet Prescription has previously been sent into pharmacy with application for medication through patient assistance program from the manufacturer.  Will send prescription to pharmacy today along with note to pharmacist to see if this medication is now available for patient pickup.  We will also send a message to the referral coordinator regarding patient's referrals to neurology/ALS specialist at an academic [facility] such as [facility] as well as his referral to pain management. - pregabalin (LYRICA) 75 MG capsule; Take 1 capsule (75 mg total) by mouth 3 (three) times daily.  Dispense: 270 capsule; Refill: 2  3. Anxiety and depression Referral placed for psychiatry due to patient's ongoing issues with chronic pain with psychosocial dysfunction as patient with anxiety and depression related to his chronic medical issues. - Ambulatory  referral to Psychiatry  4. Chronic pain syndrome Will send message to referral coordinator regarding patient's prior pain management referral.  Follow Up Instructions:Return in about 3 months (around 05/06/2019) for chronic issues.    I discussed the assessment and treatment plan with the patient. The patient was provided an opportunity to ask questions and all were answered. The patient agreed with the plan and demonstrated an understanding of the instructions.   The patient was advised to call back or seek an in-person evaluation if the symptoms worsen or if the condition fails to improve as anticipated.  I provided 12 minutes of non-face-to-face time during this encounter.   Cain Saupeammie Aariona Momon, MD

## 2019-02-04 MED FILL — NORTRIPTYLINE HCL 50 MG CAP: 50 | 90 days supply | Qty: 90 | Fill #0

## 2019-02-07 ENCOUNTER — Encounter: Payer: Self-pay | Admitting: Family Medicine

## 2019-02-07 ENCOUNTER — Encounter: Payer: Self-pay | Admitting: Internal Medicine

## 2019-02-07 ENCOUNTER — Telehealth: Payer: Self-pay | Admitting: *Deleted

## 2019-02-07 NOTE — Telephone Encounter (Addendum)
Per provider to contact Mr. Howie Ill and see if he can come in for a work-in appointment as soon as possible this afternoon as he sent a message regarding cloudy urine with an increased order and he needs evaluation for possible UTI. If he cannot come in today then he may want to go to an urgent care this weekend  Called and spoke with patient wife who stated patient is sleeping and has been sleeping since 1pm. Informed patient wife with what provider stated and wife stated she will inform patient with the information and hope he'll go to the urgent care.

## 2019-02-07 NOTE — Telephone Encounter (Signed)
Patient requesting a UA per Endo

## 2019-02-10 ENCOUNTER — Ambulatory Visit: Payer: Self-pay | Admitting: Physical Medicine & Rehabilitation

## 2019-02-18 ENCOUNTER — Encounter: Payer: Self-pay | Admitting: Physical Medicine & Rehabilitation

## 2019-02-19 MED FILL — metFORMIN HCL 1000 MG TABS: 1000 | 30 days supply | Qty: 60 | Fill #1

## 2019-02-19 MED FILL — RAMIPRIL 5 MG CAPS: 5 | 30 days supply | Qty: 60 | Fill #1

## 2019-02-19 MED FILL — ?ATORVASTATIN 40MG TABLET: 40 | 30 days supply | Qty: 30 | Fill #2

## 2019-02-24 ENCOUNTER — Other Ambulatory Visit: Payer: Self-pay | Admitting: Family Medicine

## 2019-02-24 DIAGNOSIS — E0842 Diabetes mellitus due to underlying condition with diabetic polyneuropathy: Secondary | ICD-10-CM

## 2019-02-24 MED FILL — ?HUMALOG 100 UNITS/ML KWIKP: 100 | 25 days supply | Qty: 15 | Fill #2

## 2019-02-24 MED FILL — ?AMLODIPINE BESYLATE 10 MG: 10 | 30 days supply | Qty: 30 | Fill #1

## 2019-02-24 MED FILL — ?METOPROLOL SUCC ER 50MG TA: 50 | 30 days supply | Qty: 30 | Fill #2

## 2019-02-24 MED FILL — GABAPENTIN 600 MG TABLET: 600 | 30 days supply | Qty: 180 | Fill #0

## 2019-02-24 MED FILL — FARXIGA 5 MG TABLET: 5 | 30 days supply | Qty: 30 | Fill #3

## 2019-02-24 MED FILL — $LANTUS SOLOSTAR 100 UNITS/: 100 | 93 days supply | Qty: 36 | Fill #2

## 2019-02-25 ENCOUNTER — Ambulatory Visit: Payer: Medicaid Other | Admitting: Registered"

## 2019-02-25 MED FILL — ?NITROGLYCERIN 0.4MG/HR PTC: 0.4 | 30 days supply | Qty: 30 | Fill #2

## 2019-02-26 MED FILL — !VENTOLIN HFA INHALER: 108 (90 BAS | 25 days supply | Qty: 18 | Fill #2

## 2019-03-07 ENCOUNTER — Encounter: Payer: Self-pay | Admitting: Family Medicine

## 2019-03-07 NOTE — Telephone Encounter (Signed)
Please schedule an appointment with the patient at first available with PCP for UA and neuropathy.

## 2019-03-11 ENCOUNTER — Emergency Department (HOSPITAL_COMMUNITY)
Admission: EM | Admit: 2019-03-11 | Discharge: 2019-03-12 | Discharge status: Against Medical Advice | Payer: Medicare Other | Attending: Emergency Medicine | Admitting: Emergency Medicine

## 2019-03-11 ENCOUNTER — Other Ambulatory Visit: Payer: Self-pay

## 2019-03-11 ENCOUNTER — Encounter: Payer: Self-pay | Admitting: Family Medicine

## 2019-03-11 ENCOUNTER — Emergency Department (HOSPITAL_COMMUNITY): Payer: Medicare Other

## 2019-03-11 ENCOUNTER — Encounter (HOSPITAL_COMMUNITY): Payer: Self-pay | Admitting: Emergency Medicine

## 2019-03-11 DIAGNOSIS — Z5321 Procedure and treatment not carried out due to patient leaving prior to being seen by health care provider: Secondary | ICD-10-CM | POA: Diagnosis not present

## 2019-03-11 DIAGNOSIS — R0789 Other chest pain: Secondary | ICD-10-CM | POA: Diagnosis present

## 2019-03-11 NOTE — ED Triage Notes (Signed)
Pt states he heard a "pop" on Sunday night that came from his left side rib area.  Since then he has has left sided rib pain that increases w/ deep breath.  No CP/SOB/fevers

## 2019-03-12 ENCOUNTER — Encounter: Payer: Self-pay | Admitting: Family Medicine

## 2019-03-12 ENCOUNTER — Encounter: Payer: Self-pay | Admitting: Internal Medicine

## 2019-03-13 ENCOUNTER — Ambulatory Visit (INDEPENDENT_AMBULATORY_CARE_PROVIDER_SITE_OTHER): Payer: Medicaid Other | Admitting: Physician Assistant

## 2019-03-13 ENCOUNTER — Other Ambulatory Visit: Payer: Self-pay

## 2019-03-13 ENCOUNTER — Encounter: Payer: Self-pay | Admitting: Physician Assistant

## 2019-03-13 ENCOUNTER — Ambulatory Visit: Payer: Medicaid Other | Admitting: Family Medicine

## 2019-03-13 VITALS — HR 75 | Temp 97.9°F | Ht 74.0 in | Wt 234.2 lb

## 2019-03-13 DIAGNOSIS — G894 Chronic pain syndrome: Secondary | ICD-10-CM

## 2019-03-13 DIAGNOSIS — R9389 Abnormal findings on diagnostic imaging of other specified body structures: Secondary | ICD-10-CM

## 2019-03-13 DIAGNOSIS — R002 Palpitations: Secondary | ICD-10-CM

## 2019-03-13 DIAGNOSIS — R0789 Other chest pain: Secondary | ICD-10-CM

## 2019-03-13 NOTE — Patient Instructions (Signed)
Medication Instructions:  Your physician recommends that you continue on your current medications as directed. Please refer to the Current Medication list given to you today.  If you need a refill on your cardiac medications before your next appointment, please call your pharmacy.   Follow-Up: At [facility], you and your health needs are our priority.  As part of our continuing mission to provide you with exceptional heart care, we have created designated Provider Care Teams.  These Care Teams include your primary Cardiologist (physician) and Advanced Practice Providers (APPs -  Physician Assistants and Nurse Practitioners) who all work together to provide you with the care you need, when you need it. . Please keep your follow up appointment scheduled with Dr. Oval Linsey on 04/24/19 at 2:20 PM.

## 2019-03-13 NOTE — Progress Notes (Signed)
Cardiology Office Note:    Date:  03/13/2019   ID:  Austin Vaughan, DOB [birth year], MRN [id number]  PCP:  Austin Blackbird, MD  Cardiologist:  Skeet Latch, MD   Referring MD: Austin Blackbird, MD   Chief Complaint  Patient presents with  . Follow-up    abnormal CXR    History of Present Illness:    Austin Vaughan is a 48 y.o. male with a hx of CAD s/p MI with PCI (unknown vessel) in 2013 while living in [place]. He also has a negative stress test in 04/2018 before leaving [place].   He established care with Dr. Oval Linsey on 2019 and complained of atypical chest pain, he was wearing a nitro patch intermittently. Dr. Oval Linsey suggested using this daily. He was last seen in clinic on 10/22/18 by Kerin Ransom PAC. He wore a 14-day zio patch for palpitations which showed short runs of PSVT (10 beats or less). His toprol was increased to 50 mg daily. He is also seeing neurology for severe irreversible neuropathy.   He went to the ER on 03/11/19 after hearing a pop near his rib cage and thought he broke a rib. PCP advised to obtain CXR at ER to rule out fractures.  CXR without fractures, but did show right mid lung atelectasis and left lung base atelectasis, but no pneumothorax or fracture.  He called the office to be seen ASAP and was added to my schedule.  He denies SOB, orthopnea, and DOE. He states he squatted/kneeled down on the floor to look under a table and heard a pop at his left mid-rib cage. He still feels pain in that specific area, especially when he takes a deep breath in. The pain is also exacerbated by body movement. He denies chest pain and is compliant on nitro patch. He does have palpitations, but these are not new for him.   He presents today with a cane. He states that he is weak and sometimes has leg and hand pain. He is apparently awaiting referral to [facility] to be evaluated by ALS.    Past Medical History:  Diagnosis Date  . Coronary artery disease   . Diabetes mellitus without  complication ([place])   . Hypertension     Past Surgical History:  Procedure Laterality Date  . CORONARY ANGIOPLASTY WITH STENT PLACEMENT  07/2002    Current Medications: Current Meds  Medication Sig  . albuterol (PROAIR HFA) 108 (90 Base) MCG/ACT inhaler Inhale 2 puffs into the lungs every 6 (six) hours as needed for wheezing or shortness of breath.  [name] amLODipine (NORVASC) 10 MG tablet TAKE 1 TABLET (10 MG TOTAL) BY MOUTH DAILY. TO LOWER BLOOD PRESSURE  . aspirin EC 81 MG tablet Take 81 mg by mouth daily.  [name] atorvastatin (LIPITOR) 40 MG tablet Take 1 tablet (40 mg total) by mouth daily.  . Blood Glucose Monitoring Suppl (TRUE METRIX METER) w/Device KIT Use daily to check blood sugar 4 times daily.  . dapagliflozin propanediol (FARXIGA) 10 MG TABS tablet Take 10 mg by mouth daily.  . diclofenac sodium (VOLTAREN) 1 % GEL Apply 4 g topically 4 (four) times daily. (2 gm to each hand four times per day as needed for pain)  . dicyclomine (BENTYL) 20 MG tablet Take 1 tablet (20 mg total) by mouth 4 (four) times daily. As needed for abdominal cramping  . gabapentin (NEURONTIN) 600 MG tablet TAKE 6 TABLETS (3,600 MG TOTAL) BY MOUTH AT BEDTIME.  [name] glucose blood test  strip 4x daily  . Insulin Glargine (LANTUS SOLOSTAR) 100 UNIT/ML Solostar Pen Inject 34 Units into the skin daily.  . insulin lispro (HUMALOG KWIKPEN) 100 UNIT/ML KwikPen Inject 18 units into skin 3 times daily with ,eals. Max dosage 60 units daily. DX E11.65  . metFORMIN (GLUCOPHAGE) 1000 MG tablet Take 1 tablet (1,000 mg total) by mouth 2 (two) times daily with a meal.  . metoprolol succinate (TOPROL-XL) 50 MG 24 hr tablet Take 1 tablet (50 mg total) by mouth daily.  . montelukast (SINGULAIR) 10 MG tablet Take 10 mg by mouth daily.  . nitroGLYCERIN (NITRODUR - DOSED IN MG/24 HR) 0.4 mg/hr patch Place 1 patch (0.4 mg total) onto the skin daily.  . nitroGLYCERIN (NITROSTAT) 0.4 MG SL tablet Place 0.4 mg under the tongue every 5 (five)  minutes as needed for chest pain.  . nortriptyline (PAMELOR) 50 MG capsule TAKE 1 CAPSULE (50 MG TOTAL) BY MOUTH AT BEDTIME.  . pregabalin (LYRICA) 75 MG capsule Take 1 capsule (75 mg total) by mouth 3 (three) times daily.  . ramipril (ALTACE) 5 MG capsule TAKE 2 CAPSULES (10 MG TOTAL) BY MOUTH DAILY.     Allergies:   Ambien [zolpidem tartrate] and Shellfish allergy   Social History   Socioeconomic History  . Marital status: Married    Spouse name: Not on file  . Number of children: Not on file  . Years of education: Not on file  . Highest education level: Not on file  Occupational History  . Not on file  Social Needs  . Financial resource strain: Not on file  . Food insecurity    Worry: Not on file    Inability: Not on file  . Transportation needs    Medical: Not on file    Non-medical: Not on file  Tobacco Use  . Smoking status: Never Smoker  . Smokeless tobacco: Never Used  Substance and Sexual Activity  . Alcohol use: Not Currently  . Drug use: Never  . Sexual activity: Yes  Lifestyle  . Physical activity    Days per week: Not on file    Minutes per session: Not on file  . Stress: Not on file  Relationships  . Social Herbalist on phone: Not on file    Gets together: Not on file    Attends religious service: Not on file    Active member of club or organization: Not on file    Attends meetings of clubs or organizations: Not on file    Relationship status: Not on file  Other Topics Concern  . Not on file  Social History Narrative   Wife and patient relocated to [place] from [place] a few months ago. - 06/11/18   He is on disability since 2019.     He lives with wife and daughter in a one-level apartment.      Family History: [name] family history includes ALS in his maternal aunt and mother; Heart disease in his paternal grandfather and paternal grandmother; Lung cancer in his father.  ROS:   Please see the history of present illness.     All other systems reviewed and are negative.  EKGs/Labs/Other Studies Reviewed:    The following studies were reviewed today:  none  EKG:  EKG is not ordered today.    Recent Labs: 08/22/2018: ALT 25; Hemoglobin 13.5; Platelets 355 10/17/2018: BUN 12; Creatinine, Ser 0.73; Potassium 4.0; Sodium 137; TSH 0.96  Recent Lipid Panel  Component Value Date/Time   CHOL 179 07/24/2018 1215   TRIG 100 07/24/2018 1215   HDL 48 07/24/2018 1215   CHOLHDL 3.7 07/24/2018 1215   LDLCALC 111 (H) 07/24/2018 1215    Physical Exam:    VS:  Pulse 75   Temp 97.9 F (36.6 C) (Temporal)   Ht 6' 2"  (1.88 m)   Wt 234 lb 3.2 oz (106.2 kg)   SpO2 97%   BMI 30.07 kg/m     Wt Readings from Last 3 Encounters:  03/13/19 234 lb 3.2 oz (106.2 kg)  03/11/19 226 lb (102.5 kg)  10/31/18 231 lb (104.8 kg)     GEN:  Well nourished, well developed in no acute distress HEENT: Normal NECK: No JVD; No carotid bruits LYMPHATICS: No lymphadenopathy CARDIAC: RRR, no murmurs, rubs, gallops RESPIRATORY:  Clear to auscultation without rales, wheezing or rhonchi  ABDOMEN: Soft, non-tender, non-distended MUSCULOSKELETAL:  No edema; No deformity  SKIN: Warm and dry NEUROLOGIC:  Alert and oriented x 3 PSYCHIATRIC:  Normal affect   ASSESSMENT:    1. Abnormal chest x-ray   2. Chronic pain syndrome   3. Chest wall pain   4. Palpitations    PLAN:    In order of problems listed above:  Abnormal CXR Chest wall pain Chronic pain  - chest pain sounds like MSK pain - CXR reviewed with [facility] Dr. Debara Pickett - I do not see a pneumothorax or rib fracture - he describes somewhat pleuritic pain - I do not think he is describing pain consistent with ACS - pain is constant, worse with movement and deep breathing  I advised him to follow up with his PCP to determine need for further imaging.    Follow up with Dr. Oval Linsey as planned.   Medication Adjustments/Labs and Tests Ordered: Current medicines are reviewed at  length with the patient today.  Concerns regarding medicines are outlined above.  No orders of the defined types were placed in this encounter.  No orders of the defined types were placed in this encounter.   Signed, Ledora Bottcher, PA  03/13/2019 2:43 PM    [facility]

## 2019-03-17 ENCOUNTER — Encounter: Payer: Self-pay | Admitting: Family Medicine

## 2019-03-21 ENCOUNTER — Telehealth (INDEPENDENT_AMBULATORY_CARE_PROVIDER_SITE_OTHER): Payer: Medicare Other | Admitting: Neurology

## 2019-03-21 ENCOUNTER — Other Ambulatory Visit: Payer: Self-pay

## 2019-03-21 ENCOUNTER — Encounter: Payer: Self-pay | Admitting: Neurology

## 2019-03-21 VITALS — Ht 74.0 in | Wt 230.0 lb

## 2019-03-21 DIAGNOSIS — E1142 Type 2 diabetes mellitus with diabetic polyneuropathy: Secondary | ICD-10-CM | POA: Diagnosis not present

## 2019-03-21 DIAGNOSIS — G5623 Lesion of ulnar nerve, bilateral upper limbs: Secondary | ICD-10-CM

## 2019-03-21 DIAGNOSIS — M6281 Muscle weakness (generalized): Secondary | ICD-10-CM

## 2019-03-21 NOTE — Progress Notes (Signed)
   Virtual Visit via Video Note The purpose of this virtual visit is to provide medical care while limiting exposure to the novel coronavirus.    Consent was obtained for video visit:  Yes.   Answered questions that patient had about telehealth interaction:  Yes.   I discussed the limitations, risks, security and privacy concerns of performing an evaluation and management service by telemedicine. I also discussed with the patient that there may be a patient responsible charge related to this service. The patient expressed understanding and agreed to proceed.  Pt location: Home Physician Location: office Name of referring provider:  [name], MD I connected with Austin Vaughan at patients initiation/request on 03/21/2019 at 10:50 AM EDT by video enabled telemedicine application and verified that I am speaking with the correct person using two identifiers. Pt MRN:  [id number] Pt DOB:  April 20, 1971 Video Participants:  Austin Vaughan   History of Present Illness: This is a 48 y.o. male returning for follow-up of diabetic neuropathy and bilateral hand weakness.  He reports that he continues to have bilateral hand weakness and numbness, such that it is difficult for him to open jars and bottles and has to request his family to do so.  His neuropathy extends into the lower legs up to the level of the knees.  He states that he is unable to feel the temperature of water in a pool, until it hits above his knees.  I had requested prior EMG report from his neurologist in [place], [place], however this was not faxed to my office.  He was told he has bilateral ulnar neuropathy and was not deemed a surgical candidate due to the severity of his neuropathy.  He continues to have painful paresthesias as well as generalized pain and is seeing pain management for this.     Observations/Objective:   Vitals:   03/21/19 0948  Weight: 230 lb (104.3 kg)  Height: 6\' 2"  (1.88 m)   Patient is awake, alert, and appears  comfortable.  Oriented x 4.   Extraocular muscles are intact. No ptosis.  Face is symmetric.  Speech is not dysarthric.  Antigravity in all extremities, finger tapping is intact bilaterally.  There is wasting of the FDI, intrinsic hand muscles, and ADM bilaterally.  No pronator drift.   Assessment and Plan:  1.  Bilateral hand weakness likely due to overlapping neuropathy and ulnar neuropathy  - NCS/EMG of the hands to assess severity and be sure C8 radiculopathy is not being missed  2.  Painful diabetic neuropathy in the setting of poorly controlled diabetes, last H A1c 9.5.  He does have length dependent pattern of neuropathy up to the level of the knees and into the hands.  Precautions including fall prevention and daily foot inspection was discussed.  He is on gabapentin 3600mg /d and nortriptyline 50mg  at bedtime and will be seeing pain management.    Follow Up Instructions:   I discussed the assessment and treatment plan with the patient. The patient was provided an opportunity to ask questions and all were answered. The patient agreed with the plan and demonstrated an understanding of the instructions.   The patient was advised to call back or seek an in-person evaluation if the symptoms worsen or if the condition fails to improve as anticipated.  Total time spent:  25 minutes     Alda Berthold, DO

## 2019-03-25 ENCOUNTER — Ambulatory Visit: Payer: Medicaid Other | Admitting: Registered"

## 2019-03-25 ENCOUNTER — Other Ambulatory Visit: Payer: Self-pay | Admitting: Internal Medicine

## 2019-03-25 MED FILL — GABAPENTIN 600 MG TABLET: 600 | 30 days supply | Qty: 180 | Fill #1

## 2019-03-25 MED FILL — ?ATORVASTATIN 40MG TABLET: 40 | 30 days supply | Qty: 30 | Fill #3

## 2019-03-25 MED FILL — RAMIPRIL 5 MG CAPS: 5 | 30 days supply | Qty: 60 | Fill #2

## 2019-03-25 MED FILL — metFORMIN HCL 1000 MG TABS: 1000 | 30 days supply | Qty: 60 | Fill #2

## 2019-03-25 MED FILL — ?AMLODIPINE BESYLATE 10 MG: 10 | 30 days supply | Qty: 30 | Fill #2

## 2019-03-25 MED FILL — FARXIGA 10 MG TABLET: 10 | 30 days supply | Qty: 30 | Fill #0

## 2019-03-27 ENCOUNTER — Ambulatory Visit: Payer: Medicare Other | Attending: Family Medicine | Admitting: Family Medicine

## 2019-03-27 ENCOUNTER — Encounter: Payer: Self-pay | Admitting: Family Medicine

## 2019-03-27 ENCOUNTER — Other Ambulatory Visit: Payer: Self-pay

## 2019-03-27 VITALS — BP 125/87 | HR 99 | Temp 98.1°F | Ht 74.0 in | Wt 239.8 lb

## 2019-03-27 DIAGNOSIS — I10 Essential (primary) hypertension: Secondary | ICD-10-CM | POA: Insufficient documentation

## 2019-03-27 DIAGNOSIS — I25118 Atherosclerotic heart disease of native coronary artery with other forms of angina pectoris: Secondary | ICD-10-CM

## 2019-03-27 DIAGNOSIS — E0842 Diabetes mellitus due to underlying condition with diabetic polyneuropathy: Secondary | ICD-10-CM

## 2019-03-27 DIAGNOSIS — Z79899 Other long term (current) drug therapy: Secondary | ICD-10-CM | POA: Insufficient documentation

## 2019-03-27 DIAGNOSIS — R0602 Shortness of breath: Secondary | ICD-10-CM | POA: Insufficient documentation

## 2019-03-27 DIAGNOSIS — R682 Dry mouth, unspecified: Secondary | ICD-10-CM | POA: Diagnosis not present

## 2019-03-27 DIAGNOSIS — Z794 Long term (current) use of insulin: Secondary | ICD-10-CM | POA: Diagnosis not present

## 2019-03-27 DIAGNOSIS — E1142 Type 2 diabetes mellitus with diabetic polyneuropathy: Secondary | ICD-10-CM | POA: Insufficient documentation

## 2019-03-27 DIAGNOSIS — Z955 Presence of coronary angioplasty implant and graft: Secondary | ICD-10-CM | POA: Diagnosis not present

## 2019-03-27 DIAGNOSIS — R531 Weakness: Secondary | ICD-10-CM | POA: Insufficient documentation

## 2019-03-27 DIAGNOSIS — E1165 Type 2 diabetes mellitus with hyperglycemia: Secondary | ICD-10-CM | POA: Diagnosis not present

## 2019-03-27 DIAGNOSIS — M6250 Muscle wasting and atrophy, not elsewhere classified, unspecified site: Secondary | ICD-10-CM | POA: Insufficient documentation

## 2019-03-27 DIAGNOSIS — Z7982 Long term (current) use of aspirin: Secondary | ICD-10-CM | POA: Diagnosis not present

## 2019-03-27 DIAGNOSIS — I2510 Atherosclerotic heart disease of native coronary artery without angina pectoris: Secondary | ICD-10-CM | POA: Diagnosis not present

## 2019-03-27 DIAGNOSIS — Z8249 Family history of ischemic heart disease and other diseases of the circulatory system: Secondary | ICD-10-CM | POA: Diagnosis not present

## 2019-03-27 DIAGNOSIS — R200 Anesthesia of skin: Secondary | ICD-10-CM | POA: Diagnosis not present

## 2019-03-27 DIAGNOSIS — R0789 Other chest pain: Secondary | ICD-10-CM | POA: Diagnosis present

## 2019-03-27 DIAGNOSIS — Z09 Encounter for follow-up examination after completed treatment for conditions other than malignant neoplasm: Secondary | ICD-10-CM

## 2019-03-27 DIAGNOSIS — Z7984 Long term (current) use of oral hypoglycemic drugs: Secondary | ICD-10-CM | POA: Insufficient documentation

## 2019-03-27 NOTE — Progress Notes (Signed)
Hospital follow Up  Possible Xray for lower left lung

## 2019-03-27 NOTE — Progress Notes (Signed)
Established Patient Office Visit  Subjective:  Patient ID: Austin Vaughan, male    DOB: 01-21-1971  Age: 48 y.o. MRN: [id number]  CC:  Chief Complaint  Patient presents with  . Hospitalization Follow-up    HPI Austin Vaughan presents for follow-up of her emergency department visit on 03/11/2019.  Patient reports that he was having some chest discomfort/sensation of shortness of breath for which he went to the emergency room.  Patient reports that he had been trying to reach under a table and had gotten down on his hands and knees and then on his chest while reaching and felt a popping sensation in his left chest on the day of his ED visit.  Patient wonders if he might have a rib fracture.  He reports that he had a chest x-ray and was told that the chest x-ray was abnormal and patient is being seen for follow-up.  Patient has no current sensation of shortness of breath or cough.  He does have some mild left lower lateral chest discomfort with certain movements such as twisting/reaching.  He denies any fever or chills.  No known exposures to anyone with COVID-19.         He continues to have chronic issues with painful numbness in his hands and feet due to neuropathy as well as continued weakness and decreased use of his hands due to numbness and muscle wasting.  He has not yet heard anything regarding specialty referral to [facility] or other tertiary [facility].  He does have a strong family history of several family members with multiple sclerosis and he is concerned that some of his symptoms may be related to multiple sclerosis but are being treated as if symptoms are related to neuropathy.         He remains compliant with his diabetes medications and states that for the most part his blood sugars are well controlled with fasting blood sugars generally remaining less than 140 and usually less than 120.  He has had no hypoglycemic episodes.  He has no current issues with urinary frequency.  He does have  some increased thirst/dry mouth which he believes is related to his medications.  He has noticed a strange odor to the urine and he would like to have the urine checked to make sure that he does not have infection or other issues with his urine.        He does have a history of heart disease but does not believe that his current issues with chest discomfort are CAD related.  He reports that he uses nitroglycerin patches which keep him from having left-sided chest pain associated with heart disease.  He reports that he does follow-up with his cardiologist.  Past Medical History:  Diagnosis Date  . Coronary artery disease   . Diabetes mellitus without complication ([place])   . Hypertension     Past Surgical History:  Procedure Laterality Date  . CORONARY ANGIOPLASTY WITH STENT PLACEMENT  07/2002    Family History  Problem Relation Age of Onset  . ALS Mother   . Lung cancer Father   . ALS Maternal Aunt   . Heart disease Paternal Grandmother   . Heart disease Paternal Grandfather     Social History   Socioeconomic History  . Marital status: Married    Spouse name: Not on file  . Number of children: Not on file  . Years of education: Not on file  . Highest education level: Not on  file  Occupational History  . Not on file  Social Needs  . Financial resource strain: Not on file  . Food insecurity    Worry: Not on file    Inability: Not on file  . Transportation needs    Medical: Not on file    Non-medical: Not on file  Tobacco Use  . Smoking status: Never Smoker  . Smokeless tobacco: Never Used  Substance and Sexual Activity  . Alcohol use: Not Currently  . Drug use: Never  . Sexual activity: Yes  Lifestyle  . Physical activity    Days per week: Not on file    Minutes per session: Not on file  . Stress: Not on file  Relationships  . Social Herbalist on phone: Not on file    Gets together: Not on file    Attends religious service: Not on file    Active  member of club or organization: Not on file    Attends meetings of clubs or organizations: Not on file    Relationship status: Not on file  . Intimate partner violence    Fear of current or ex partner: Not on file    Emotionally abused: Not on file    Physically abused: Not on file    Forced sexual activity: Not on file  Other Topics Concern  . Not on file  Social History Narrative   Wife and patient relocated to [place] from [place] a few months ago. - 06/11/18   He is on disability since 2019.     He lives with wife and daughter in a one-level apartment.    Right handed    Outpatient Medications Prior to Visit  Medication Sig Dispense Refill  . albuterol (PROAIR HFA) 108 (90 Base) MCG/ACT inhaler Inhale 2 puffs into the lungs every 6 (six) hours as needed for wheezing or shortness of breath. 1 Inhaler 11  . amLODipine (NORVASC) 10 MG tablet TAKE 1 TABLET (10 MG TOTAL) BY MOUTH DAILY. TO LOWER BLOOD PRESSURE 90 tablet 0  . aspirin EC 81 MG tablet Take 81 mg by mouth daily.    [name] atorvastatin (LIPITOR) 40 MG tablet Take 1 tablet (40 mg total) by mouth daily. 30 tablet 5  . Blood Glucose Monitoring Suppl (TRUE METRIX METER) w/Device KIT Use daily to check blood sugar 4 times daily. 1 kit 0  . dapagliflozin propanediol (FARXIGA) 10 MG TABS tablet Take 10 mg by mouth daily. 30 tablet 6  . diclofenac sodium (VOLTAREN) 1 % GEL Apply 4 g topically 4 (four) times daily. (2 gm to each hand four times per day as needed for pain) 2 Tube 6  . dicyclomine (BENTYL) 20 MG tablet Take 1 tablet (20 mg total) by mouth 4 (four) times daily. As needed for abdominal cramping 120 tablet 0  . gabapentin (NEURONTIN) 600 MG tablet TAKE 6 TABLETS (3,600 MG TOTAL) BY MOUTH AT BEDTIME. 240 tablet 1  . glucose blood test strip 4x daily 150 each 12  . Insulin Glargine (LANTUS SOLOSTAR) 100 UNIT/ML Solostar Pen Inject 34 Units into the skin daily. (Patient taking differently: Inject 40 Units into the skin daily. )  15 mL 1  . insulin lispro (HUMALOG KWIKPEN) 100 UNIT/ML KwikPen Inject 18 units into skin 3 times daily with ,eals. Max dosage 60 units daily. DX E11.65 15 mL 11  . metFORMIN (GLUCOPHAGE) 1000 MG tablet Take 1 tablet (1,000 mg total) by mouth 2 (two) times daily with a meal.  180 tablet 3  . metoprolol succinate (TOPROL-XL) 50 MG 24 hr tablet Take 1 tablet (50 mg total) by mouth daily. 30 tablet 6  . montelukast (SINGULAIR) 10 MG tablet Take 10 mg by mouth daily.    . nitroGLYCERIN (NITRODUR - DOSED IN MG/24 HR) 0.4 mg/hr patch Place 1 patch (0.4 mg total) onto the skin daily. 30 patch 6  . nitroGLYCERIN (NITROSTAT) 0.4 MG SL tablet Place 0.4 mg under the tongue every 5 (five) minutes as needed for chest pain.    . nortriptyline (PAMELOR) 50 MG capsule TAKE 1 CAPSULE (50 MG TOTAL) BY MOUTH AT BEDTIME. 90 capsule 0  . pregabalin (LYRICA) 75 MG capsule Take 1 capsule (75 mg total) by mouth 3 (three) times daily. 270 capsule 2  . ramipril (ALTACE) 5 MG capsule TAKE 2 CAPSULES (10 MG TOTAL) BY MOUTH DAILY. 180 capsule 0   No facility-administered medications prior to visit.     Allergies  Allergen Reactions  . Ambien [Zolpidem Tartrate] Other (See Comments)    HALLUCINATIONS  . Shellfish Allergy Anaphylaxis    Seafood    ROS Review of Systems  Constitutional: Positive for fatigue. Negative for chills and fever.  HENT: Negative for sore throat and trouble swallowing.   Eyes: Negative for photophobia and visual disturbance.  Respiratory: Negative for cough and shortness of breath.   Cardiovascular: Positive for chest pain. Negative for palpitations and leg swelling.  Gastrointestinal: Negative for abdominal pain, blood in stool, constipation, diarrhea and nausea.  Endocrine: Positive for polydipsia. Negative for polyphagia and polyuria.  Genitourinary: Negative for dysuria, frequency and hematuria.  Musculoskeletal: Positive for arthralgias, gait problem and myalgias.  Neurological:  Positive for weakness and numbness. Negative for dizziness and headaches.  Hematological: Negative for adenopathy. Does not bruise/bleed easily.  Psychiatric/Behavioral: Positive for sleep disturbance. Negative for self-injury and suicidal ideas. The patient is nervous/anxious.       Objective:    Physical Exam  Constitutional: He is oriented to person, place, and time. He appears well-developed and well-nourished.  Neck: Normal range of motion. Neck supple.  Cardiovascular: Normal rate and regular rhythm.  Pulmonary/Chest: Effort normal and breath sounds normal.  Abdominal: Soft. There is no abdominal tenderness. There is no rebound and no guarding.  Musculoskeletal:        General: Tenderness (Patient with some tenderness left lateral chest wall between ribs 9 and 10) present. No edema.  Lymphadenopathy:    He has no cervical adenopathy.  Neurological: He is alert and oriented to person, place, and time.  Skin: Skin is warm and dry.  Psychiatric: He has a normal mood and affect. His behavior is normal.  Slightly flattened affect  Nursing note and vitals reviewed.   BP 125/87 (BP Location: Right Arm, Patient Position: Sitting, Cuff Size: Large)   Pulse 99   Temp 98.1 F (36.7 C) (Oral)   Ht 6' 2"  (1.88 m)   Wt 239 lb 12.8 oz (108.8 kg)   SpO2 96%   BMI 30.79 kg/m  Wt Readings from Last 3 Encounters:  03/27/19 239 lb 12.8 oz (108.8 kg)  03/21/19 230 lb (104.3 kg)  03/13/19 234 lb 3.2 oz (106.2 kg)     Health Maintenance Due  Topic Date Due  . FOOT EXAM  01/20/1979  . HIV Screening  01/20/1984  . COLONOSCOPY  01/20/2019  . INFLUENZA VACCINE  03/15/2019  . HEMOGLOBIN A1C  03/20/2019    There are no preventive care reminders to display for this  patient.  Lab Results  Component Value Date   TSH 0.96 10/17/2018   Lab Results  Component Value Date   WBC 6.5 08/22/2018   HGB 13.5 08/22/2018   HCT 40.5 08/22/2018   MCV 81 08/22/2018   PLT 355 08/22/2018   Lab  Results  Component Value Date   NA 137 10/17/2018   K 4.0 10/17/2018   CO2 29 10/17/2018   GLUCOSE 307 (H) 10/17/2018   BUN 12 10/17/2018   CREATININE 0.73 10/17/2018   BILITOT 0.5 08/22/2018   ALKPHOS 149 (H) 08/22/2018   AST 12 08/22/2018   ALT 25 08/22/2018   PROT 6.9 08/22/2018   ALBUMIN 4.4 08/22/2018   CALCIUM 9.7 10/17/2018   ANIONGAP 10 06/04/2018   GFR 113.85 10/17/2018   Lab Results  Component Value Date   CHOL 179 07/24/2018   Lab Results  Component Value Date   HDL 48 07/24/2018   Lab Results  Component Value Date   LDLCALC 111 (H) 07/24/2018   Lab Results  Component Value Date   TRIG 100 07/24/2018   Lab Results  Component Value Date   CHOLHDL 3.7 07/24/2018   Lab Results  Component Value Date   HGBA1C 9.5 (A) 09/19/2018      Assessment & Plan:  1. Left-sided chest wall pain Patient status post emergency department visit due to left-sided chest wall pain however patient left before completion of ED visit.  Patient did have chest x-ray showing streaky atelectasis/scarring within the right midlung and left lung base and atelectasis/x-ray findings were discussed with the patient.  There was no evidence of her rib fracture but discussed with the patient that he may have strained the intercostal musculature as he continues to have pain with certain movements as well as reproducible tenderness to the chest wall on exam.  Patient may take over-the-counter nonsteroidal anti-inflammatories if needed for this pain.  2. Type 2 diabetes mellitus with hyperglycemia, with long-term current use of insulin [facility]) Patient reports that he has not recently seen his endocrinologist as appointment may have been scheduled due to COVID-19.  Patient will have lipid panel, hemoglobin A1c and urinalysis at today's visit.  Patient will be notified of the results and if any changes are needed in his medications based on the results.  He is also advised to continue a healthy diet and  regular monitoring of his blood sugars. - Lipid panel - Hemoglobin A1C - Urinalysis, Routine w reflex microscopic  3. Diabetic polyneuropathy associated with diabetes mellitus due to underlying condition [facility]) Patient will have hemoglobin A1c in follow-up of his diabetic polyneuropathy.  Patient is also being referred to neurology for further evaluation of his neuropathy versus possible multiple sclerosis.  Patient does have local neurologist and pain management with whom he is being followed - Hemoglobin A1C  4. Coronary artery disease of native artery of native heart with stable angina pectoris [facility]) Patient will have lipid panel in follow-up of his coronary artery disease.  He is also encouraged to make sure that he keeps appointments for continued regular follow-up of his coronary artery disease and that he report issues with chest pain to his cardiologist. - Lipid panel  5. Numbness in both hands Patient with numbness and weakness in both hands which may be related to diabetic polyneuropathy however patient also with strong family history of multiple sclerosis.  He is being referred again to neurology at a tertiary [facility] and more specifically to a multiple sclerosis clinic if available. -  Ambulatory referral to Neurology  6. Encounter for examination following treatment at hospital Patient is seen in follow-up of his recent emergency department visit however per hospital notes, patient left shortly after triage without completion of ED visit.  Discussed x-ray findings of atelectasis versus scarring in the right midlung and left lung base but no evidence of a displaced rib fracture.  Patient most likely does have a strain of the intercostal muscles in the left chest wall as he continues to have an area of reproducible tenderness.  No tenderness along the ribs and no palpable rib deformity.  An After Visit Summary was printed and given to the patient.  Follow-up: Return in about 3  months (around 06/27/2019) for chronic issues in 3-4 months and as needed.    [name], MD

## 2019-03-28 LAB — LIPID PANEL
Chol/HDL Ratio: 3.6 ratio (ref 0.0–5.0)
Cholesterol, Total: 147 mg/dL (ref 100–199)
HDL: 41 mg/dL
LDL Calculated: 80 mg/dL (ref 0–99)
Triglycerides: 129 mg/dL (ref 0–149)
VLDL Cholesterol Cal: 26 mg/dL (ref 5–40)

## 2019-03-28 LAB — URINALYSIS, ROUTINE W REFLEX MICROSCOPIC
Bilirubin, UA: NEGATIVE
Ketones, UA: NEGATIVE
Leukocytes,UA: NEGATIVE
Nitrite, UA: NEGATIVE
Protein,UA: NEGATIVE
RBC, UA: NEGATIVE
Urobilinogen, Ur: 0.2 mg/dL (ref 0.2–1.0)
pH, UA: 5.5 (ref 5.0–7.5)

## 2019-03-28 LAB — HEMOGLOBIN A1C
Est. average glucose Bld gHb Est-mCnc: 157 mg/dL
Hgb A1c MFr Bld: 7.1 % — ABNORMAL HIGH (ref 4.8–5.6)

## 2019-04-08 ENCOUNTER — Encounter: Payer: Self-pay | Admitting: Family Medicine

## 2019-04-08 ENCOUNTER — Encounter: Payer: Self-pay | Admitting: Internal Medicine

## 2019-04-08 ENCOUNTER — Other Ambulatory Visit: Payer: Self-pay | Admitting: Family Medicine

## 2019-04-08 ENCOUNTER — Other Ambulatory Visit: Payer: Self-pay | Admitting: Internal Medicine

## 2019-04-08 DIAGNOSIS — F32A Depression, unspecified: Secondary | ICD-10-CM

## 2019-04-08 DIAGNOSIS — F329 Major depressive disorder, single episode, unspecified: Secondary | ICD-10-CM

## 2019-04-08 DIAGNOSIS — F419 Anxiety disorder, unspecified: Secondary | ICD-10-CM

## 2019-04-08 MED ORDER — DEXCOM G6 RECEIVER DEVI: 1.0000 | 0 refills | Status: DC

## 2019-04-08 MED ORDER — DEXCOM G6 SENSOR MISC: 1.0000 | 6 refills | Status: DC

## 2019-04-08 MED ORDER — DEXCOM G6 TRANSMITTER MISC: 1.0000 | 3 refills | Status: DC

## 2019-04-08 NOTE — Progress Notes (Signed)
dexcom

## 2019-04-08 NOTE — Progress Notes (Signed)
Patient ID: Austin Vaughan, male   DOB: 1971/01/14, 48 y.o.   MRN: [id number]   My Chart message received from patient requesting a new referral to a psychologist.

## 2019-04-11 MED FILL — ?METOPROLOL SUCC ER 50MG TA: 50 | 30 days supply | Qty: 30 | Fill #3

## 2019-04-16 ENCOUNTER — Encounter: Payer: Self-pay | Admitting: Internal Medicine

## 2019-04-16 ENCOUNTER — Encounter: Payer: Self-pay | Admitting: Family Medicine

## 2019-04-24 ENCOUNTER — Ambulatory Visit (INDEPENDENT_AMBULATORY_CARE_PROVIDER_SITE_OTHER): Payer: Medicare Other | Admitting: Cardiovascular Disease

## 2019-04-24 ENCOUNTER — Other Ambulatory Visit: Payer: Self-pay

## 2019-04-24 ENCOUNTER — Encounter: Payer: Self-pay | Admitting: Cardiovascular Disease

## 2019-04-24 VITALS — HR 83 | Temp 97.2°F | Ht 74.0 in | Wt 245.4 lb

## 2019-04-24 DIAGNOSIS — R0602 Shortness of breath: Secondary | ICD-10-CM

## 2019-04-24 DIAGNOSIS — I208 Other forms of angina pectoris: Secondary | ICD-10-CM | POA: Diagnosis not present

## 2019-04-24 DIAGNOSIS — E7800 Pure hypercholesterolemia, unspecified: Secondary | ICD-10-CM

## 2019-04-24 DIAGNOSIS — I2510 Atherosclerotic heart disease of native coronary artery without angina pectoris: Secondary | ICD-10-CM | POA: Diagnosis not present

## 2019-04-24 DIAGNOSIS — Z9861 Coronary angioplasty status: Secondary | ICD-10-CM | POA: Diagnosis not present

## 2019-04-24 DIAGNOSIS — Z5181 Encounter for therapeutic drug level monitoring: Secondary | ICD-10-CM

## 2019-04-24 MED ORDER — METOPROLOL SUCCINATE ER 50 MG PO TB24: ORAL_TABLET | ORAL | 3 refills | Status: DC

## 2019-04-24 MED ORDER — ATORVASTATIN CALCIUM 80 MG PO TABS: 80.0000 mg | ORAL_TABLET | Freq: Every day | ORAL | 3 refills | Status: DC

## 2019-04-24 MED FILL — ATORVASTATIN 80 MG TABLET: 80 | 30 days supply | Qty: 30 | Fill #0

## 2019-04-24 MED FILL — ?METOPROLOL SUCC ER 50MG TA: 50 | 30 days supply | Qty: 45 | Fill #0

## 2019-04-24 NOTE — Patient Instructions (Signed)
Medication Instructions:  INCREASE YOUR ATORVASTATIN TO 80 MG DAILY   INCREASE YOUR METOPROLOL TO 50 MG 1 AND 1/2 DAILY   If you need a refill on your cardiac medications before your next appointment, please call your pharmacy.   Lab work: FASTING LP/CMET IN 3 MONTHS  If you have labs (blood work) drawn today and your tests are completely normal, you will receive your results only by: [name] MyChart Message (if you have MyChart) OR . A paper copy in the mail If you have any lab test that is abnormal or we need to change your treatment, we will call you to review the results.  Testing/Procedures: Your physician has requested that you have an echocardiogram. Echocardiography is a painless test that uses sound waves to create images of your heart. It provides your doctor with information about the size and shape of your heart and how well your heart's chambers and valves are working. This procedure takes approximately one hour. There are no restrictions for this procedure. [address]  Follow-Up: At [facility], you and your health needs are our priority.  As part of our continuing mission to provide you with exceptional heart care, we have created designated Provider Care Teams.  These Care Teams include your primary Cardiologist (physician) and Advanced Practice Providers (APPs -  Physician Assistants and Nurse Practitioners) who all work together to provide you with the care you need, when you need it. You will need a follow up appointment in 3 months.   You may see Skeet Latch, MD or one of the following Advanced Practice Providers on your designated Care Team:   Kerin Ransom, PA-C Roby Lofts, [place] . Sande Rives, PA-C  Any Other Special Instructions Will Be Listed Below (If Applicable).   Echocardiogram An echocardiogram is a procedure that uses painless sound waves (ultrasound) to produce an image of the heart. Images from an echocardiogram can  provide important information about:  Signs of coronary artery disease (CAD).  Aneurysm detection. An aneurysm is a weak or damaged part of an artery wall that bulges out from the normal force of blood pumping through the body.  Heart size and shape. Changes in the size or shape of the heart can be associated with certain conditions, including heart failure, aneurysm, and CAD.  Heart muscle function.  Heart valve function.  Signs of a past heart attack.  Fluid buildup around the heart.  Thickening of the heart muscle.  A tumor or infectious growth around the heart valves. Tell a health care provider about:  Any allergies you have.  All medicines you are taking, including vitamins, herbs, eye drops, creams, and over-the-counter medicines.  Any blood disorders you have.  Any surgeries you have had.  Any medical conditions you have.  Whether you are pregnant or may be pregnant. What are the risks? Generally, this is a safe procedure. However, problems may occur, including:  Allergic reaction to dye (contrast) that may be used during the procedure. What happens before the procedure? No specific preparation is needed. You may eat and drink normally. What happens during the procedure?   An IV tube may be inserted into one of your veins.  You may receive contrast through this tube. A contrast is an injection that improves the quality of the pictures from your heart.  A gel will be applied to your chest.  A wand-like tool (transducer) will be moved over your chest. The gel will help to  transmit the sound waves from the transducer.  The sound waves will harmlessly bounce off of your heart to allow the heart images to be captured in real-time motion. The images will be recorded on a computer. The procedure may vary among health care providers and hospitals. What happens after the procedure?  You may return to your normal, everyday life, including diet, activities, and  medicines, unless your health care provider tells you not to do that. Summary  An echocardiogram is a procedure that uses painless sound waves (ultrasound) to produce an image of the heart.  Images from an echocardiogram can provide important information about the size and shape of your heart, heart muscle function, heart valve function, and fluid buildup around your heart.  You do not need to do anything to prepare before this procedure. You may eat and drink normally.  After the echocardiogram is completed, you may return to your normal, everyday life, unless your health care provider tells you not to do that. This information is not intended to replace advice given to you by your health care provider. Make sure you discuss any questions you have with your health care provider. Document Released: 07/28/2000 Document Revised: 11/21/2018 Document Reviewed: 09/02/2016 Elsevier Patient Education  2020 [employer]

## 2019-04-24 NOTE — Progress Notes (Signed)
Cardiology Office Note   Date:  05/05/2019   ID:  Austin Vaughan, DOB 1970-09-12, MRN [id number]  PCP:  Austin Blackbird, MD  Cardiologist:   Austin Latch, MD   No chief complaint on file.    History of Present Illness: Austin Vaughan is a 48 y.o. male with hypertension, diabetes, NSVT, and CAD s/p PCI here for follow up.  He was treated for an MI in 2013 while living in [place], [place].  He presented with bilateral arm and chest pain.  He underwent PCI in an unknown vessel and reports that his LVEF was 47%.  Prior to moving to [place] he had a nuclear stress 04/2028 that revealed no ischemia and LVEF 53%.  He moved to [place] to be closer to his daughter-in-law who recently had a baby.  At his initial appointment he complained of intermittent chest pain.  It was noted that he was not using his nitroglycerin patch daily.  It was recommended that he start to use it each day.  He followed up with Austin Ransom, PA-C on 10/2018 and reported palpitations.  He had a 14-day ZIO patch that showed short runs of PSVT.  Metoprolol was increased to 50 mg.  He followed up with Austin Adas, PA, on 7/30 and reported pleuritic chest pain that was inconsistent with ACS.  He has been suffering with severe neuropathic pain.  Austin Vaughan reports frequent palpitations.  His symptoms have not improved since increasing metoprolol.  He has R>L LE edema that does not improve with elevation.  He sometimes get short of breath when walking, espeically up stairs.  His breathing seems to be getting worse.  He barely eats but is gaining weight.  He also notes pain in his LEs.  He felt something pop after picking something up.  The pain comes and goes.  He has mostly been struggling with neuropathic pain from peripheral neuropathy.  He is very concerned that he may have ALS, as he has several family members with this disease, including his mother.   Past Medical History:  Diagnosis Date  . Coronary artery disease   . Diabetes mellitus  without complication ([place])   . Hypertension     Past Surgical History:  Procedure Laterality Date  . CORONARY ANGIOPLASTY WITH STENT PLACEMENT  07/2002     Current Outpatient Medications  Medication Sig Dispense Refill  . albuterol (PROAIR HFA) 108 (90 Base) MCG/ACT inhaler Inhale 2 puffs into the lungs every 6 (six) hours as needed for wheezing or shortness of breath. 1 Inhaler 11  . amLODipine (NORVASC) 10 MG tablet TAKE 1 TABLET (10 MG TOTAL) BY MOUTH DAILY. TO LOWER BLOOD PRESSURE 90 tablet 0  . aspirin EC 81 MG tablet Take 81 mg by mouth daily.    [name] atorvastatin (LIPITOR) 80 MG tablet Take 1 tablet (80 mg total) by mouth daily. 90 tablet 3  . Blood Glucose Monitoring Suppl (TRUE METRIX METER) w/Device KIT Use daily to check blood sugar 4 times daily. 1 kit 0  . Continuous Blood Gluc Receiver (DEXCOM G6 RECEIVER) DEVI 1 Device by Does not apply route as directed. 1 Device 0  . Continuous Blood Gluc Sensor (DEXCOM G6 SENSOR) MISC 1 Device by Does not apply route as directed. 3 each 6  . Continuous Blood Gluc Transmit (DEXCOM G6 TRANSMITTER) MISC 1 Device by Does not apply route as directed. 1 each 3  . dapagliflozin propanediol (FARXIGA) 10 MG TABS tablet Take 10 mg by  mouth daily. 30 tablet 6  . diclofenac sodium (VOLTAREN) 1 % GEL Apply 4 g topically 4 (four) times daily. (2 gm to each hand four times per day as needed for pain) 2 Tube 6  . dicyclomine (BENTYL) 20 MG tablet Take 1 tablet (20 mg total) by mouth 4 (four) times daily. As needed for abdominal cramping 120 tablet 0  . gabapentin (NEURONTIN) 600 MG tablet TAKE 6 TABLETS (3,600 MG TOTAL) BY MOUTH AT BEDTIME. 240 tablet 1  . glucose blood test strip 4x daily 150 each 12  . Insulin Glargine (LANTUS SOLOSTAR) 100 UNIT/ML Solostar Pen Inject 34 Units into the skin daily. (Patient taking differently: Inject 40 Units into the skin daily. ) 15 mL 1  . insulin lispro (HUMALOG KWIKPEN) 100 UNIT/ML KwikPen Inject 18 units into skin 3  times daily with ,eals. Max dosage 60 units daily. DX E11.65 15 mL 11  . metFORMIN (GLUCOPHAGE) 1000 MG tablet Take 1 tablet (1,000 mg total) by mouth 2 (two) times daily with a meal. 180 tablet 3  . metoprolol succinate (TOPROL-XL) 50 MG 24 hr tablet Take 1 and 1/2 tablets by mouth daily 135 tablet 3  . montelukast (SINGULAIR) 10 MG tablet Take 10 mg by mouth daily.    . nitroGLYCERIN (NITRODUR - DOSED IN MG/24 HR) 0.4 mg/hr patch Place 1 patch (0.4 mg total) onto the skin daily. 30 patch 6  . nitroGLYCERIN (NITROSTAT) 0.4 MG SL tablet Place 0.4 mg under the tongue every 5 (five) minutes as needed for chest pain.    . nortriptyline (PAMELOR) 50 MG capsule TAKE 1 CAPSULE (50 MG TOTAL) BY MOUTH AT BEDTIME. 90 capsule 0  . pregabalin (LYRICA) 75 MG capsule Take 1 capsule (75 mg total) by mouth 3 (three) times daily. 270 capsule 2  . ramipril (ALTACE) 5 MG capsule TAKE 2 CAPSULES (10 MG TOTAL) BY MOUTH DAILY. 60 capsule 2   No current facility-administered medications for this visit.     Allergies:   Ambien [zolpidem tartrate] and Shellfish allergy    Social History:  The patient  reports that he has never smoked. He has never used smokeless tobacco. He reports previous alcohol use. He reports that he does not use drugs.   Family History:  [name] family history includes ALS in his maternal aunt and mother; Heart disease in his paternal grandfather and paternal grandmother; Lung cancer in his father.    ROS:  Please see the history of present illness.   Otherwise, review of systems are positive for cold hands and feet.   All other systems are reviewed and negative.    PHYSICAL EXAM: VS:  Pulse 83   Temp (!) 97.2 F (36.2 C) (Temporal)   Ht _0  (1.88 m)   Wt 245 lb 6.4 oz (111.3 kg)   SpO2 97%   BMI 31.51 kg/m  , BMI Body mass index is 31.51 kg/m. GENERAL:  Chronically ill-appearing HEENT:  Pupils equal round and reactive, fundi not visualized, oral mucosa unremarkable NECK:   No jugular venous distention, waveform within normal limits, carotid upstroke brisk and symmetric, no bruits LUNGS:  Clear to auscultation bilaterally HEART:  RRR.  PMI not displaced or sustained,S1 and S2 within normal limits, no S3, no S4, no clicks, no rubs, no murmurs ABD:  Flat, positive bowel sounds normal in frequency in pitch, no bruits, no rebound, no guarding, no midline pulsatile mass, no hepatomegaly, no splenomegaly EXT:  2 plus pulses throughout, no edema, no  cyanosis no clubbing SKIN:  No rashes no nodules NEURO:  Cranial nerves II through XII grossly intact, motor grossly intact throughout PSYCH:  Cognitively intact, oriented to person place and time  EKG:  EKG is ordered today. The ekg ordered 07/11/19 demonstrates sinus rhythm.  Rate 85 bpm.   04/24/19: Sinus rhythm.  Rate 83 bpm.    Nuclear stress 04/18/18:  LVEF 53%.  No inducible ischemia.  Inferior defect consistent with diaphragmatic attenuation.  Recent Labs: 08/22/2018: Hemoglobin 13.5; Platelets 355 10/17/2018: TSH 0.96 04/29/2019: ALT 18; BUN 12; Creatinine, Ser 0.72; Potassium 4.8; Sodium 135    Lipid Panel    Component Value Date/Time   CHOL 121 04/29/2019 1143   TRIG 86 04/29/2019 1143   HDL 43 04/29/2019 1143   CHOLHDL 2.8 04/29/2019 1143   LDLCALC 80 03/27/2019 1646      Wt Readings from Last 3 Encounters:  04/24/19 245 lb 6.4 oz (111.3 kg)  03/27/19 239 lb 12.8 oz (108.8 kg)  03/21/19 230 lb (104.3 kg)      ASSESSMENT AND PLAN:  # CAD s/p PCI: # Chronic angina: # Hyperlipidemia: Mr. Speyer reports recurrent angina.  He had a negative stress 04/2018.  Given his prior stent we won't get a cardiac CT.  Repeat Lexiscan myoview.  He is unable to exercise.  LDL was 80 on 8/202 so we will increase atorvastatin to 53m.  Repeat lipids/CMP in 3 months.  Increase metoprolol to 773mdaily.   # Shortness of breath: # Lower extremity edema: # Chronic systolic and diastolic heart failure:  Check an echo.  #  Claudication: Normal ABIs 07/2018.  This is neuropathic pain.  # Hypertension:  BP controlled on amlodipine, ramipril.  Increasing metoprolol as above.     Current medicines are reviewed at length with the patient today.  The patient does not have concerns regarding medicines.  The following changes have been made:  Increase metoprolol   Labs/ tests ordered today include:   Orders Placed This Encounter  Procedures  . Lipid panel  . Comprehensive metabolic panel  . EKG 12-Lead  . ECHOCARDIOGRAM COMPLETE     Disposition:   FU with Delano Frate C. RaOval LinseyMD, FARoane [facility] Hospitaln 3 months.    Signed, Shaleigh Laubscher C. RaOval LinseyMD, FAPalm Beach Surgical Suites LLC9/21/2020 2:50 PM    [facility]

## 2019-04-25 ENCOUNTER — Other Ambulatory Visit: Payer: Self-pay | Admitting: Family Medicine

## 2019-04-25 MED FILL — FARXIGA 10 MG TABLET: 10 | 30 days supply | Qty: 30 | Fill #1

## 2019-04-25 MED FILL — GABAPENTIN 600 MG TABLET: 600 | 20 days supply | Qty: 120 | Fill #2

## 2019-04-25 MED FILL — metFORMIN HCL 1000 MG TABS: 1000 | 30 days supply | Qty: 60 | Fill #3

## 2019-04-25 MED FILL — RAMIPRIL 5 MG CAPS: 5 | 30 days supply | Qty: 60 | Fill #0

## 2019-04-28 ENCOUNTER — Encounter: Payer: Self-pay | Admitting: Internal Medicine

## 2019-04-29 ENCOUNTER — Encounter: Payer: Self-pay | Admitting: Neurology

## 2019-04-29 ENCOUNTER — Ambulatory Visit (INDEPENDENT_AMBULATORY_CARE_PROVIDER_SITE_OTHER): Payer: Medicare Other | Admitting: Internal Medicine

## 2019-04-29 ENCOUNTER — Other Ambulatory Visit: Payer: Self-pay

## 2019-04-29 ENCOUNTER — Ambulatory Visit (INDEPENDENT_AMBULATORY_CARE_PROVIDER_SITE_OTHER): Payer: Medicare Other | Admitting: Neurology

## 2019-04-29 ENCOUNTER — Encounter: Payer: Self-pay | Admitting: Internal Medicine

## 2019-04-29 DIAGNOSIS — Z794 Long term (current) use of insulin: Secondary | ICD-10-CM

## 2019-04-29 DIAGNOSIS — E1142 Type 2 diabetes mellitus with diabetic polyneuropathy: Secondary | ICD-10-CM | POA: Diagnosis not present

## 2019-04-29 DIAGNOSIS — E1165 Type 2 diabetes mellitus with hyperglycemia: Secondary | ICD-10-CM | POA: Diagnosis not present

## 2019-04-29 DIAGNOSIS — G5623 Lesion of ulnar nerve, bilateral upper limbs: Secondary | ICD-10-CM

## 2019-04-29 LAB — LIPID PANEL
Chol/HDL Ratio: 2.8 ratio (ref 0.0–5.0)
Cholesterol, Total: 121 mg/dL (ref 100–199)
HDL: 43 mg/dL (ref >39)
LDL Chol Calc (NIH): 61 mg/dL (ref 0–99)
Triglycerides: 86 mg/dL (ref 0–149)
VLDL Cholesterol Cal: 17 mg/dL (ref 5–40)

## 2019-04-29 LAB — COMPREHENSIVE METABOLIC PANEL
ALT: 18 IU/L (ref 0–44)
AST: 10 IU/L (ref 0–40)
Albumin/Globulin Ratio: 1.7 (ref 1.2–2.2)
Albumin: 4.8 g/dL (ref 4.0–5.0)
Alkaline Phosphatase: 130 IU/L — ABNORMAL HIGH (ref 39–117)
BUN/Creatinine Ratio: 17 (ref 9–20)
BUN: 12 mg/dL (ref 6–24)
Bilirubin Total: 0.7 mg/dL (ref 0.0–1.2)
CO2: 24 mmol/L (ref 20–29)
Calcium: 10 mg/dL (ref 8.7–10.2)
Chloride: 97 mmol/L (ref 96–106)
Creatinine, Ser: 0.72 mg/dL — ABNORMAL LOW (ref 0.76–1.27)
GFR calc Af Amer: 126 mL/min/{1.73_m2} (ref >59)
GFR calc non Af Amer: 109 mL/min/{1.73_m2} (ref >59)
Globulin, Total: 2.8 g/dL (ref 1.5–4.5)
Glucose: 191 mg/dL — ABNORMAL HIGH (ref 65–99)
Potassium: 4.8 mmol/L (ref 3.5–5.2)
Sodium: 135 mmol/L (ref 134–144)
Total Protein: 7.6 g/dL (ref 6.0–8.5)

## 2019-04-29 NOTE — Progress Notes (Signed)
Virtual Visit via Video Note  I connected with Austin Vaughan 04/29/19 at  9:30 AM EDT by a video enabled telemedicine application and verified that I am speaking with the correct person using two identifiers.   I discussed the limitations of evaluation and management by telemedicine and the availability of in person appointments. The patient expressed understanding and agreed to proceed.   -Location of the patient : Home  -Location of the provider : Office  -The names of all persons participating in the telemedicine service : CMA Lolita Rieger - Referring Provider : [name] , MD      PATIENT IDENTIFIER: Austin Vaughan is a 48 y.o. male with a past medical history of T2DM, HTN, CAD. The patient has followed with Endocrinology clinic since 10/17/2018 for consultative assistance with management of his diabetes.  DIABETIC HISTORY:  Austin Vaughan was diagnosed with T2DM since 2013. He has tried Januvia in the past but was ineffective in controlling his hyperglycemia. He used to be on Metformin but unclear the reason for discontinuation.He has been on insulin ~ 2018. His hemoglobin A1c has ranged from  9.5% in 2020, peaking at 10.0% in 2019.  SUBJECTIVE:   During the last visit (11/28/2018): We increased metformin , started farxiga at 5 mg, increased lantus and novolog as well.      Today (04/29/2019): Austin Vaughan is here for a 6 week virtual follow up on his diabetes management.  He checks his blood sugars 1-2 times daily, preprandial to breakfast and supper The patient has not had hypoglycemic episodes since the last clinic visit. Otherwise, the patient has not required any recent emergency interventions for hypoglycemia and has not had recent hospitalizations secondary to hyper or hypoglycemic episodes.   He avoids sugar-sweetened beverages and rarely snacks.   He is being referred to pain management through his PCP.   He denies any genital infections while on farxiga.   Due to metformin  recall on ER , this was switched to regular release but he continued to take 2 tabs in am and 1 with supper.   He has been eating a lot of fruits as snacks.   ROS: As per HPI and as detailed below: Review of Systems  Constitutional: Negative for fever.  HENT: Negative for congestion and sore throat.   Cardiovascular: Positive for leg swelling. Negative for chest pain and palpitations.  Gastrointestinal: Negative for diarrhea and nausea.      HOME DIABETES REGIMEN:   Lantus 34 units daily  NovoLog 18 units 3 times daily q. before meals  Metformin 1000 mg BID  Farxiga 10 mg daily       GLUCOSE LOG:  Date Breakfast  Lunch Bedtime  04/29/2019 154    9/14   85/207  9/13 180 187 189  9/12 204 212 250      HISTORY:  Past Medical History:  Past Medical History:  Diagnosis Date  . Coronary artery disease   . Diabetes mellitus without complication ([place])   . Hypertension    Past Surgical History:  Past Surgical History:  Procedure Laterality Date  . CORONARY ANGIOPLASTY WITH STENT PLACEMENT  07/2002    Social History:  reports that he has never smoked. He has never used smokeless tobacco. He reports previous alcohol use. He reports that he does not use drugs. Family History:  Family History  Problem Relation Age of Onset  . ALS Mother   . Lung cancer Father   . ALS Maternal Aunt   .  Heart disease Paternal Grandmother   . Heart disease Paternal Grandfather      HOME MEDICATIONS: Allergies as of 04/29/2019      Reactions   Ambien [zolpidem Tartrate] Other (See Comments)   HALLUCINATIONS   Shellfish Allergy Anaphylaxis   Seafood      Medication List       Accurate as of April 29, 2019  7:30 AM. If you have any questions, ask your nurse or doctor.        albuterol 108 (90 Base) MCG/ACT inhaler Commonly known as: ProAir HFA Inhale 2 puffs into the lungs every 6 (six) hours as needed for wheezing or shortness of breath.   amLODipine 10 MG tablet  Commonly known as: NORVASC TAKE 1 TABLET (10 MG TOTAL) BY MOUTH DAILY. TO LOWER BLOOD PRESSURE   aspirin EC 81 MG tablet Take 81 mg by mouth daily.   atorvastatin 80 MG tablet Commonly known as: LIPITOR Take 1 tablet (80 mg total) by mouth daily.   Dexcom G6 Receiver Devi 1 Device by Does not apply route as directed.   Dexcom G6 Sensor Misc 1 Device by Does not apply route as directed.   Dexcom G6 Transmitter Misc 1 Device by Does not apply route as directed.   diclofenac sodium 1 % Gel Commonly known as: VOLTAREN Apply 4 g topically 4 (four) times daily. (2 gm to each hand four times per day as needed for pain)   dicyclomine 20 MG tablet Commonly known as: BENTYL Take 1 tablet (20 mg total) by mouth 4 (four) times daily. As needed for abdominal cramping   Farxiga 10 MG Tabs tablet Generic drug: dapagliflozin propanediol Take 10 mg by mouth daily.   gabapentin 600 MG tablet Commonly known as: NEURONTIN TAKE 6 TABLETS (3,600 MG TOTAL) BY MOUTH AT BEDTIME.   glucose blood test strip 4x daily   insulin lispro 100 UNIT/ML KwikPen Commonly known as: HumaLOG KwikPen Inject 18 units into skin 3 times daily with ,eals. Max dosage 60 units daily. DX E11.65   Lantus SoloStar 100 UNIT/ML Solostar Pen Generic drug: Insulin Glargine Inject 34 Units into the skin daily. What changed: how much to take   metFORMIN 1000 MG tablet Commonly known as: GLUCOPHAGE Take 1 tablet (1,000 mg total) by mouth 2 (two) times daily with a meal.   metoprolol succinate 50 MG 24 hr tablet Commonly known as: TOPROL-XL Take 1 and 1/2 tablets by mouth daily   montelukast 10 MG tablet Commonly known as: SINGULAIR Take 10 mg by mouth daily.   nitroGLYCERIN 0.4 MG SL tablet Commonly known as: NITROSTAT Place 0.4 mg under the tongue every 5 (five) minutes as needed for chest pain.   nitroGLYCERIN 0.4 mg/hr patch Commonly known as: NITRODUR - Dosed in mg/24 hr Place 1 patch (0.4 mg total)  onto the skin daily.   nortriptyline 50 MG capsule Commonly known as: PAMELOR TAKE 1 CAPSULE (50 MG TOTAL) BY MOUTH AT BEDTIME.   pregabalin 75 MG capsule Commonly known as: LYRICA Take 1 capsule (75 mg total) by mouth 3 (three) times daily.   ramipril 5 MG capsule Commonly known as: ALTACE TAKE 2 CAPSULES (10 MG TOTAL) BY MOUTH DAILY.   True Metrix Meter w/Device Kit Use daily to check blood sugar 4 times daily.         DATA REVIEWED:  Lab Results  Component Value Date   HGBA1C 7.1 (H) 03/27/2019   HGBA1C 9.5 (A) 09/19/2018   HGBA1C 10.0 (A) 07/08/2018  Lab Results  Component Value Date   LDLCALC 80 03/27/2019   CREATININE 0.73 10/17/2018     Lab Results  Component Value Date   CHOL 147 03/27/2019   HDL 41 03/27/2019   LDLCALC 80 03/27/2019   TRIG 129 03/27/2019   CHOLHDL 3.6 03/27/2019         ASSESSMENT / PLAN / RECOMMENDATIONS:   1) Type 2 Diabetes Mellitus, Poorly controlled, With neuropathic and macrovascular complications - Most recent A1c of 9.5 %. Goal A1c < 7.0 %.    - No recent A1c due to recent virtual visit - He denies any side effects to [place]  - Discussed insulin- CHO mismatch hence the BG fluctuation between 85-250 mg/dL at bedtime  - No changes will be made today    MEDICATIONS:  Metformin 1000 mg BID  Farxiga 10 mg daily   Lantus to 34 units daily   Novolog 18 units TID QAC  EDUCATION / INSTRUCTIONS:  BG monitoring instructions: Patient is instructed to check his blood sugars 4 times a day, before meals and bedtime.  Call [facility] if: BG persistently < 70 or > 300. . I reviewed the Rule of 15 for the treatment of hypoglycemia in detail with the patient. Literature supplied.    I discussed the assessment and treatment plan with the patient. The patient was provided an opportunity to ask questions and all were answered. The patient agreed with the plan and demonstrated an understanding of the  instructions.   The patient was advised to call back or seek an in-person evaluation if the symptoms worsen or if the condition fails to improve as anticipated.    F/U in 4 months     Signed electronically by: Mack Guise, MD  [facility] Endocrinology  [address] [place], [place] [address] Phone: [phone] FAX: [phone]   CC: [name], MD [place] [address] Phone: [phone]  Fax: [phone]  Return to Endocrinology clinic as below: Future Appointments  Date Time Provider [place]  04/29/2019  9:30 AM Shawntay Prest, Melanie Crazier, MD [facility] None  04/29/2019 12:45 PM Narda Amber K, DO [facility] None  05/01/2019  2:00 PM Christella Hartigan, RD [facility]DM[facility] NDM  05/05/2019  1:50 PM MC[facility]CV [facility] ECHO 2 [id number] [facility]  07/29/2019  2:40 PM Skeet Latch, MD [facility]

## 2019-04-29 NOTE — Progress Notes (Signed)
Austin Vaughan had a hemoglobin A1c done in office on 03/27/2019 that was 7.1. Previously his in office Hgb A1c was 9.5 on 09/19/2018.

## 2019-04-29 NOTE — Procedures (Signed)
[facility] Neurology  [address], [address]  [place], [place] [address] Tel: [phone] Fax:  [phone] Test Date:  04/29/2019  Patient: Austin Vaughan DOB: 16-Sep-1970 Physician: Narda Amber, DO  Sex: Male Height: 6\' 2"  Ref Phys: Narda Amber, DO  ID#: [id number] Temp: 32.0C Technician:    Patient Complaints: This is a 48 year old man with diabetes mellitus referred for evaluation of bilateral hand paresthesias and weakness.  NCV & EMG Findings: Extensive electrodiagnostic testing of the right upper extremity and additional studies of the left shows: 1. Bilateral median and ulnar sensory responses are absent.  Bilateral radial sensory responses are within normal limits. 2. Bilateral median motor responses show prolonged latency (R4.3, L4.7 ms) and reduced amplitude (R1.8, L5.6 mV).  Additionally, median conduction velocity on the left is slowed (Elbow-Wrist, 44 m/s).  Bilateral ulnar motor responses are markedly reduced (R2.1, L0.6 mV) and shows slowed conduction velocity (22 - 50 m/s) along the course of the nerve.  Additionally, the left ulnar motor response shows prolonged latency (L3.6 ms). 3. Severe chronic motor axonal loss changes are seen affecting the distal hand muscles bilaterally, with active denervation in the right first dorsal interosseous muscle.  Impression: The electrophysiologic findings are consistent with a severe sensorimotor polyneuropathy, with axonal and demyelinating features, affecting the upper extremities.   ___________________________ Narda Amber, DO    Nerve Conduction Studies Anti Sensory Summary Table   Site NR Peak (ms) Norm Peak (ms) P-T Amp (V) Norm P-T Amp  Left Median Anti Sensory (2nd Digit)  32C  Wrist NR  <3.6  >15  Right Median Anti Sensory (2nd Digit)  32C  Wrist NR  <3.6  >15  Left Radial Anti Sensory (Base 1st Digit)  32C  Wrist    2.6 <2.7 18.9 >14  Right Radial Anti Sensory (Base 1st Digit)  32C  Wrist    2.2 <2.7  15.5 >14  Left Ulnar Anti Sensory (5th Digit)  32C  Wrist NR  <3.1  >10  Right Ulnar Anti Sensory (5th Digit)  32C  Wrist NR  <3.1  >10   Motor Summary Table   Site NR Onset (ms) Norm Onset (ms) O-P Amp (mV) Norm O-P Amp Site1 Site2 Delta-0 (ms) Dist (cm) Vel (m/s) Norm Vel (m/s)  Left Median Motor (Abd Poll Brev)  32C  Wrist    4.7 <4.0 5.6 >6 Elbow Wrist 7.3 32.0 44 >50  Elbow    12.0  4.7         Right Median Motor (Abd Poll Brev)  32C  Wrist    4.3 <4.0 1.8 >6 Elbow Wrist 6.6 34.0 52 >50  Elbow    10.9  1.3         Left Ulnar Motor (Abd Dig Minimi)  32C  Wrist    3.6 <3.1 0.6 >7 B Elbow Wrist 8.1 24.0 30 >50  B Elbow    11.7  0.4  A Elbow B Elbow 4.6 10.0 22 >50  A Elbow    16.3  0.5         Right Ulnar Motor (Abd Dig Minimi)  32C  Wrist    2.9 <3.1 2.1 >7 B Elbow Wrist 4.8 24.0 50 >50  B Elbow    7.7  1.6  A Elbow B Elbow 4.6 10.0 22 >50  A Elbow    12.3  0.8          EMG   Side Muscle Ins Act Fibs Psw Fasc Number Recrt  Dur Dur. Amp Amp. Poly Poly. Comment  Right 1stDorInt Nml 1+ Nml Nml SMU Mod-R All 1+ All 1+ All 1+ ATR  Right Abd Poll Brev Nml Nml Nml Nml 3- Mod-R Most 1+ Most 1+ Most 1+ N/A  Right Ext Indicis Nml Nml Nml Nml 2- Mod-R Many 1+ Many 1+ Many 1+ N/A  Right PronatorTeres Nml Nml Nml Nml Nml Nml Nml Nml Nml Nml Nml Nml N/A  Right Biceps Nml Nml Nml Nml Nml Nml Nml Nml Nml Nml Nml Nml N/A  Right Triceps Nml Nml Nml Nml Nml Nml Nml Nml Nml Nml Nml Nml N/A  Right Deltoid Nml Nml Nml Nml Nml Nml Nml Nml Nml Nml Nml Nml N/A  Right ABD Dig Min Nml Nml Nml Nml SMU Mod-R All 1+ All 1+ All 1+ ATR  Right FlexCarpiUln Nml Nml Nml Nml Nml Nml Nml Nml Nml Nml Nml Nml N/A  Left 1stDorInt Nml Nml Nml Nml SMU Mod-R All 1+ All 1+ All 1+ ATR  Left Abd Poll Brev Nml Nml Nml Nml SMU Mod-R All 1+ All 1+ All 1+ N/A  Left Ext Indicis Nml Nml Nml Nml 3- Mod-R Most 1+ Most 1+ Most 1+ N/A  Left PronatorTeres Nml Nml Nml Nml Nml Nml Nml Nml Nml Nml Nml Nml N/A  Left Biceps Nml  Nml Nml Nml Nml Nml Nml Nml Nml Nml Nml Nml N/A  Left Triceps Nml Nml Nml Nml Nml Nml Nml Nml Nml Nml Nml Nml N/A  Left Deltoid Nml Nml Nml Nml Nml Nml Nml Nml Nml Nml Nml Nml N/A      Waveforms:

## 2019-04-30 ENCOUNTER — Telehealth (INDEPENDENT_AMBULATORY_CARE_PROVIDER_SITE_OTHER): Payer: Medicare Other | Admitting: Neurology

## 2019-04-30 ENCOUNTER — Other Ambulatory Visit: Payer: Self-pay

## 2019-04-30 DIAGNOSIS — I2510 Atherosclerotic heart disease of native coronary artery without angina pectoris: Secondary | ICD-10-CM

## 2019-04-30 DIAGNOSIS — M6281 Muscle weakness (generalized): Secondary | ICD-10-CM

## 2019-04-30 DIAGNOSIS — E1142 Type 2 diabetes mellitus with diabetic polyneuropathy: Secondary | ICD-10-CM | POA: Diagnosis not present

## 2019-04-30 DIAGNOSIS — Z9861 Coronary angioplasty status: Secondary | ICD-10-CM | POA: Diagnosis not present

## 2019-04-30 NOTE — Progress Notes (Signed)
Orders placed and referral faxed to [facility] and dana craddock to schedule 6 month. thanks

## 2019-04-30 NOTE — Progress Notes (Signed)
   Virtual Visit via Video Note The purpose of this virtual visit is to provide medical care while limiting exposure to the novel coronavirus.    Consent was obtained for video visit:  Yes.   Answered questions that patient had about telehealth interaction:  Yes.   I discussed the limitations, risks, security and privacy concerns of performing an evaluation and management service by telemedicine. I also discussed with the patient that there may be a patient responsible charge related to this service. The patient expressed understanding and agreed to proceed.  Pt location: Home Physician Location: office Name of referring provider:  [name], MD I connected with Austin Vaughan at patients initiation/request on 04/30/2019 at  9:10 AM EDT by video enabled telemedicine application and verified that I am speaking with the correct person using two identifiers. Pt MRN:  [id number] Pt DOB:  1971-02-07 Video Participants:  Austin Vaughan   History of Present Illness: This is a 48 y.o. male with diabetic neuropathy returning for follow-up of bilateral hand numbness, tingling, and weakness.  At his prior visit in August 2020, he was complaining of worsening hand numbness and weakness over the past 4 to 5 years.  It is now hard for him to open jars and bottles.  He had EMG performed on 9/15 which showed severe sensorimotor polyneuropathy, demyelinating and axonal loss features.    Observations/Objective:   Patient is awake, alert, and appears comfortable.  Oriented x 4.   Extraocular muscles are intact. No ptosis.  Face is symmetric.  Speech is not dysarthric. Antigravity in all extremities, atrophy of intrinsic hand muscles bilaterally, limited finger abduction bilaterally, worse on the left.   No pronator drift. Gait appears wide-based, stable..  NCS/EMG of the arms 04/29/2019:  The electrophysiologic findings are consistent with a severe sensorimotor polyneuropathy, with axonal and demyelinating  features, affecting the upper extremities.  Assessment and Plan:  Painful diabetic peripheral neuropathy in the setting of poorly controlled diabetes affecting stocking glove distribution manifesting with paresthesias distal to the knees and hands.  Electrodiagnostic testing shows severe sensorimotor neuropathy in the hands.  Due to the severity of neuropathy, it is difficult to determine if there is an overlapping ulnar neuropathy.  Per patient, prior EMG in [place], [place] had indicated that he had bilateral ulnar neuropathy and was not a surgical candidate due to the severity of neuropathy. To be complete, I will order nerve ultrasound of the left median and ulnar nerves to be sure there is no compressive lesion, given the asymmetrical weakness in his left hand.  Overall, my suspicion is that his neuropathy is only progressing and without optimal control of diabetes, this will only continue to get worse. For hand strengthening, he will be referred for occupational therapy.   Follow Up Instructions:   I discussed the assessment and treatment plan with the patient. The patient was provided an opportunity to ask questions and all were answered. The patient agreed with the plan and demonstrated an understanding of the instructions.   The patient was advised to call back or seek an in-person evaluation if the symptoms worsen or if the condition fails to improve as anticipated.  Follow-up in 6 months  Total time spent:  20 minutes     Alda Berthold, DO

## 2019-05-01 ENCOUNTER — Ambulatory Visit: Payer: Medicare Other | Admitting: Registered"

## 2019-05-05 ENCOUNTER — Other Ambulatory Visit: Payer: Self-pay

## 2019-05-05 ENCOUNTER — Ambulatory Visit (HOSPITAL_COMMUNITY): Payer: Medicare Other | Attending: Cardiology

## 2019-05-05 DIAGNOSIS — R0602 Shortness of breath: Secondary | ICD-10-CM

## 2019-05-13 ENCOUNTER — Other Ambulatory Visit: Payer: Self-pay | Admitting: Family Medicine

## 2019-05-13 DIAGNOSIS — E0842 Diabetes mellitus due to underlying condition with diabetic polyneuropathy: Secondary | ICD-10-CM

## 2019-05-16 MED FILL — NORTRIPTYLINE HCL 50 MG CAP: 50 | 30 days supply | Qty: 30 | Fill #0

## 2019-05-16 MED FILL — GABAPENTIN 600 MG TABLET: 600 | 30 days supply | Qty: 240 | Fill #1

## 2019-05-19 MED FILL — ?HUMALOG 100 UNITS/ML KWIKP: 100 | 25 days supply | Qty: 15 | Fill #3

## 2019-05-19 MED FILL — $LANTUS SOLOSTAR 100 UNITS/: 100 | 35 days supply | Qty: 12 | Fill #0

## 2019-05-26 MED FILL — FARXIGA 10 MG TABLET: 10 | 30 days supply | Qty: 30 | Fill #2

## 2019-05-26 MED FILL — metFORMIN HCL 1000 MG TABS: 1000 | 30 days supply | Qty: 60 | Fill #4

## 2019-05-26 MED FILL — RAMIPRIL 5 MG CAPS: 5 | 30 days supply | Qty: 60 | Fill #1

## 2019-05-26 MED FILL — ATORVASTATIN 80 MG TABLET: 80 | 30 days supply | Qty: 30 | Fill #1

## 2019-06-10 MED FILL — ?METOPROLOL SUCC ER 50MG TA: 50 | 30 days supply | Qty: 45 | Fill #1

## 2019-06-11 ENCOUNTER — Encounter (HOSPITAL_COMMUNITY): Payer: Self-pay | Admitting: Emergency Medicine

## 2019-06-11 ENCOUNTER — Other Ambulatory Visit: Payer: Self-pay

## 2019-06-11 ENCOUNTER — Emergency Department (HOSPITAL_COMMUNITY)
Admission: EM | Admit: 2019-06-11 | Discharge: 2019-06-12 | Disposition: A | Discharge status: Home / Self Care | Payer: Medicare Other | Attending: Emergency Medicine | Admitting: Emergency Medicine

## 2019-06-11 ENCOUNTER — Emergency Department (HOSPITAL_COMMUNITY): Payer: Medicare Other

## 2019-06-11 DIAGNOSIS — I2510 Atherosclerotic heart disease of native coronary artery without angina pectoris: Secondary | ICD-10-CM | POA: Insufficient documentation

## 2019-06-11 DIAGNOSIS — R0789 Other chest pain: Secondary | ICD-10-CM | POA: Insufficient documentation

## 2019-06-11 DIAGNOSIS — I10 Essential (primary) hypertension: Secondary | ICD-10-CM | POA: Diagnosis not present

## 2019-06-11 DIAGNOSIS — Z7982 Long term (current) use of aspirin: Secondary | ICD-10-CM | POA: Diagnosis not present

## 2019-06-11 DIAGNOSIS — E119 Type 2 diabetes mellitus without complications: Secondary | ICD-10-CM | POA: Insufficient documentation

## 2019-06-11 DIAGNOSIS — Z955 Presence of coronary angioplasty implant and graft: Secondary | ICD-10-CM | POA: Insufficient documentation

## 2019-06-11 DIAGNOSIS — Z794 Long term (current) use of insulin: Secondary | ICD-10-CM | POA: Diagnosis not present

## 2019-06-11 DIAGNOSIS — Z79899 Other long term (current) drug therapy: Secondary | ICD-10-CM | POA: Insufficient documentation

## 2019-06-11 LAB — BASIC METABOLIC PANEL
Anion gap: 11 (ref 5–15)
BUN: 8 mg/dL (ref 6–20)
CO2: 27 mmol/L (ref 22–32)
Calcium: 9.5 mg/dL (ref 8.9–10.3)
Chloride: 102 mmol/L (ref 98–111)
Creatinine, Ser: 0.86 mg/dL (ref 0.61–1.24)
GFR calc Af Amer: >60 mL/min (ref >60)
GFR calc non Af Amer: >60 mL/min (ref >60)
Glucose, Bld: 199 mg/dL — ABNORMAL HIGH (ref 70–99)
Potassium: 4.3 mmol/L (ref 3.5–5.1)
Sodium: 140 mmol/L (ref 135–145)

## 2019-06-11 LAB — TROPONIN I (HIGH SENSITIVITY): Troponin I (High Sensitivity): <2 ng/L (ref <18)

## 2019-06-11 LAB — CBC
HCT: 50.8 % (ref 39.0–52.0)
Hemoglobin: 16.1 g/dL (ref 13.0–17.0)
MCH: 27.3 pg (ref 26.0–34.0)
MCHC: 31.7 g/dL (ref 30.0–36.0)
MCV: 86.2 fL (ref 80.0–100.0)
Platelets: 334 10*3/uL (ref 150–400)
RBC: 5.89 MIL/uL — ABNORMAL HIGH (ref 4.22–5.81)
RDW: 12 % (ref 11.5–15.5)
WBC: 8.8 10*3/uL (ref 4.0–10.5)
nRBC: 0 % (ref 0.0–0.2)

## 2019-06-11 LAB — PROTIME-INR
INR: 1 (ref 0.8–1.2)
Prothrombin Time: 13.2 seconds (ref 11.4–15.2)

## 2019-06-11 MED ORDER — SODIUM CHLORIDE 0.9% FLUSH: 3.0000 mL | Freq: Once | INTRAVENOUS | Status: DC

## 2019-06-11 NOTE — ED Triage Notes (Signed)
Patient reports left chest pain with mild SOB and nausea onset this evening , no emesis or diaphoresis , his cardiologist is Dr. Oval Linsey , history of CAD.Coronary Stent .

## 2019-06-12 ENCOUNTER — Encounter: Payer: Self-pay | Admitting: Family Medicine

## 2019-06-12 LAB — TROPONIN I (HIGH SENSITIVITY): Troponin I (High Sensitivity): <2 ng/L (ref <18)

## 2019-06-12 NOTE — ED Provider Notes (Signed)
[facility] EMERGENCY DEPARTMENT Provider Note   CSN: [id number] Arrival date & time: 06/11/19  2150     History   Chief Complaint Chief Complaint  Patient presents with  . Chest Pain    HPI Austin Vaughan is a 48 y.o. male.     Patient states that last night he had 10 minutes of chest discomfort no sweating no shortness of breath.  Patient has a history of coronary artery disease and has 1 stent.  Patient has no other symptoms now  The history is provided by the patient. No language interpreter was used.  Chest Pain Pain location:  L chest Pain quality: aching   Pain radiates to:  Does not radiate Pain severity:  Moderate Onset quality:  Sudden Timing:  Rare Progression:  Resolved Associated symptoms: no abdominal pain, no back pain, no cough, no fatigue and no headache     Past Medical History:  Diagnosis Date  . Coronary artery disease   . Diabetes mellitus without complication ([place])   . Hypertension     Patient Active Problem List   Diagnosis Date Noted  . Neuropathy 12/17/2018  . Diabetes mellitus ([place]) 11/28/2018  . Type 2 diabetes mellitus with hyperglycemia, with long-term current use of insulin ([place]) 11/28/2018  . Type 2 diabetes mellitus with diabetic polyneuropathy, with long-term current use of insulin ([place]) 11/28/2018  . PSVT (paroxysmal supraventricular tachycardia) ([place]) 10/22/2018  . Diabetic polyneuropathy associated with type 2 diabetes mellitus ([place]) 09/20/2018  . Ulnar neuropathy of both upper extremities 09/20/2018  . Palpitations 09/17/2018  . CAD S/P percutaneous coronary angioplasty 09/17/2018  . Dyslipidemia, goal LDL below 70 09/17/2018  . Insulin dependent diabetes mellitus with complications 41/58/3094  . Painful diabetic neuropathy ([place]) 07/26/2018  . Hypertension     Past Surgical History:  Procedure Laterality Date  . CORONARY ANGIOPLASTY WITH STENT PLACEMENT  07/2002        Home Medications    Prior  to Admission medications   Medication Sig Start Date End Date Taking? Authorizing Provider  albuterol (PROAIR HFA) 108 (90 Base) MCG/ACT inhaler Inhale 2 puffs into the lungs every 6 (six) hours as needed for wheezing or shortness of breath. 12/17/18  Yes Fulp, [name], MD  amLODipine (NORVASC) 10 MG tablet TAKE 1 TABLET (10 MG TOTAL) BY MOUTH DAILY. TO LOWER BLOOD PRESSURE 01/27/19  Yes Fulp, [name], MD  aspirin EC 81 MG tablet Take 81 mg by mouth daily.   Yes [provider]  atorvastatin (LIPITOR) 80 MG tablet Take 1 tablet (80 mg total) by mouth daily. 04/24/19  Yes Skeet Latch, MD  dapagliflozin propanediol (FARXIGA) 10 MG TABS tablet Take 10 mg by mouth daily. 01/28/19  Yes Shamleffer, Melanie Crazier, MD  dicyclomine (BENTYL) 20 MG tablet Take 1 tablet (20 mg total) by mouth 4 (four) times daily. As needed for abdominal cramping 08/22/18  Yes Fulp, [name], MD  gabapentin (NEURONTIN) 600 MG tablet TAKE 6 TABLETS (3,600 MG TOTAL) BY MOUTH AT BEDTIME. 05/16/19  Yes Fulp, [name], MD  insulin lispro (HUMALOG KWIKPEN) 100 UNIT/ML KwikPen Inject 18 units into skin 3 times daily with ,eals. Max dosage 60 units daily. DX E11.65 Patient taking differently: Inject 18 Units into the skin 3 (three) times daily. Max dosage 60 units daily. 02/03/19  Yes Fulp, [name], MD  metFORMIN (GLUCOPHAGE) 1000 MG tablet Take 1 tablet (1,000 mg total) by mouth 2 (two) times daily with a meal. 01/28/19  Yes Shamleffer, Melanie Crazier, MD  metoprolol succinate (TOPROL-XL) 50 MG 24 hr tablet Take 1 and 1/2 tablets by mouth daily 04/24/19  Yes Skeet Latch, MD  montelukast (SINGULAIR) 10 MG tablet Take 10 mg by mouth daily.   Yes [provider]  nitroGLYCERIN (NITRODUR - DOSED IN MG/24 HR) 0.4 mg/hr patch Place 1 patch (0.4 mg total) onto the skin daily. 09/17/18  Yes Kilroy, Luke K, PA-C  nitroGLYCERIN (NITROSTAT) 0.4 MG SL tablet Place 0.4 mg under the tongue every 5 (five) minutes as needed for chest pain.    Yes [provider]  nortriptyline (PAMELOR) 50 MG capsule TAKE 1 CAPSULE (50 MG TOTAL) BY MOUTH AT BEDTIME. 05/16/19  Yes Fulp, [name], MD  pregabalin (LYRICA) 75 MG capsule Take 1 capsule (75 mg total) by mouth 3 (three) times daily. Patient taking differently: Take 225 mg by mouth at bedtime.  02/03/19  Yes Fulp, [name], MD  ramipril (ALTACE) 5 MG capsule TAKE 2 CAPSULES (10 MG TOTAL) BY MOUTH DAILY. Patient taking differently: Take 10 mg by mouth daily.  04/25/19  Yes Fulp, [name], MD  Blood Glucose Monitoring Suppl (TRUE METRIX METER) w/Device KIT Use daily to check blood sugar 4 times daily. 10/18/18   Shamleffer, Melanie Crazier, MD  Continuous Blood Gluc Receiver (DEXCOM G6 RECEIVER) DEVI 1 Device by Does not apply route as directed. 04/08/19   Shamleffer, Melanie Crazier, MD  Continuous Blood Gluc Sensor (DEXCOM G6 SENSOR) MISC 1 Device by Does not apply route as directed. 04/08/19   Shamleffer, Melanie Crazier, MD  Continuous Blood Gluc Transmit (DEXCOM G6 TRANSMITTER) MISC 1 Device by Does not apply route as directed. 04/08/19   Shamleffer, Melanie Crazier, MD  glucose blood test strip 4x daily 10/18/18   Shamleffer, Melanie Crazier, MD  Insulin Glargine (LANTUS SOLOSTAR) 100 UNIT/ML Solostar Pen Inject 34 Units into the skin daily. Patient taking differently: Inject 40 Units into the skin daily.  02/03/19   [name], MD    Family History Family History  Problem Relation Age of Onset  . ALS Mother   . Lung cancer Father   . ALS Maternal Aunt   . Heart disease Paternal Grandmother   . Heart disease Paternal Grandfather     Social History Social History   Tobacco Use  . Smoking status: Never Smoker  . Smokeless tobacco: Never Used  Substance Use Topics  . Alcohol use: Not Currently  . Drug use: Never     Allergies   Ambien [zolpidem tartrate] and Shellfish allergy   Review of Systems Review of Systems  Constitutional: Negative for appetite change and fatigue.   HENT: Negative for congestion, ear discharge and sinus pressure.   Eyes: Negative for discharge.  Respiratory: Negative for cough.   Cardiovascular: Positive for chest pain.  Gastrointestinal: Negative for abdominal pain and diarrhea.  Genitourinary: Negative for frequency and hematuria.  Musculoskeletal: Negative for back pain.  Skin: Negative for rash.  Neurological: Negative for seizures and headaches.  Psychiatric/Behavioral: Negative for hallucinations.     Physical Exam Updated Vital Signs BP 117/89   Pulse 89   Temp 98 F (36.7 C) (Oral)   Resp 20   SpO2 100%   Physical Exam Vitals signs and nursing note reviewed.  Constitutional:      Appearance: He is well-developed.  HENT:     Head: Normocephalic.     Nose: Nose normal.  Eyes:     General: No scleral icterus.    Conjunctiva/sclera: Conjunctivae normal.  Neck:  Musculoskeletal: Neck supple.     Thyroid: No thyromegaly.  Cardiovascular:     Rate and Rhythm: Normal rate and regular rhythm.     Heart sounds: No murmur. No friction rub. No gallop.   Pulmonary:     Breath sounds: No stridor. No wheezing or rales.  Chest:     Chest wall: No tenderness.  Abdominal:     General: There is no distension.     Tenderness: There is no abdominal tenderness. There is no rebound.  Musculoskeletal: Normal range of motion.  Lymphadenopathy:     Cervical: No cervical adenopathy.  Skin:    Findings: No erythema or rash.  Neurological:     Mental Status: He is oriented to person, place, and time.     Motor: No abnormal muscle tone.     Coordination: Coordination normal.  Psychiatric:        Behavior: Behavior normal.      ED Treatments / Results  Labs (all labs ordered are listed, but only abnormal results are displayed) Labs Reviewed  BASIC METABOLIC PANEL - Abnormal; Notable for the following components:      Result Value   Glucose, Bld 199 (*)    All other components within normal limits  CBC - Abnormal;  Notable for the following components:   RBC 5.89 (*)    All other components within normal limits  PROTIME-INR  TROPONIN I (HIGH SENSITIVITY)  TROPONIN I (HIGH SENSITIVITY)    EKG EKG Interpretation  Date/Time:  Wednesday June 11 2019 22:04:32 EDT Ventricular Rate:  115 PR Interval:  138 QRS Duration: 82 QT Interval:  320 QTC Calculation: 442 R Axis:   38 Text Interpretation: Sinus tachycardia with occasional Premature ventricular complexes Otherwise normal ECG Confirmed by Orpah Greek [phone]) on 06/12/2019 7:46:47 AM   Radiology Dg Chest 2 View  Result Date: 06/11/2019 CLINICAL DATA:  48 year old male with chest pain. EXAM: CHEST - 2 VIEW COMPARISON:  Chest radiograph dated 03/11/2019 FINDINGS: Shallow inspiration. No focal consolidation, pleural effusion, or pneumothorax. The cardiac silhouette is within normal limits. No acute osseous pathology. IMPRESSION: No active cardiopulmonary disease. Electronically Signed   By: Anner Crete M.D.   On: 06/11/2019 22:25    Procedures Procedures (including critical care time)  Medications Ordered in ED Medications  sodium chloride flush (NS) 0.9 % injection 3 mL (has no administration in time range)     Initial Impression / Assessment and Plan / ED Course  I have reviewed the triage vital signs and the nursing notes.  Pertinent labs & imaging results that were available during my care of the patient were reviewed by me and considered in my medical decision making (see chart for details).   Troponin x2 is negative.  EKG shows 1 PVC otherwise normal.  Rest of his labs are normal along with chest x-ray.  Patient with atypical chest pain.  No symptoms now.  Patient was instructed to follow-up with cardiologist next week and return here if any problem      Final Clinical Impressions(s) / ED Diagnoses   Final diagnoses:  Atypical chest pain    ED Discharge Orders    None       Milton Ferguson, MD 06/12/19  [phone]

## 2019-06-12 NOTE — Discharge Instructions (Addendum)
Follow-up with your cardiologist next week

## 2019-06-13 MED FILL — ?NITROGLYCERIN 0.4MG/HR PTC: 0.4 | 30 days supply | Qty: 30 | Fill #3

## 2019-06-16 MED FILL — NORTRIPTYLINE HCL 50 MG CAP: 50 | 30 days supply | Qty: 30 | Fill #1

## 2019-06-18 NOTE — Progress Notes (Deleted)
Cardiology Clinic Note   Patient Name: Austin Vaughan Date of Encounter: 06/18/2019  Primary Care Provider:  [name], MD Primary Cardiologist:  Skeet Latch, MD  Patient Profile    Austin Vaughan 48 year old male presents today for follow-up for his chest pain, coronary artery disease, hypertension and PSVT.  Past Medical History    Past Medical History:  Diagnosis Date   Coronary artery disease    Diabetes mellitus without complication ([place])    Hypertension    Past Surgical History:  Procedure Laterality Date   CORONARY ANGIOPLASTY WITH STENT PLACEMENT  07/2002    Allergies  Allergies  Allergen Reactions   Ambien [Zolpidem Tartrate] Other (See Comments)    HALLUCINATIONS   Shellfish Allergy Anaphylaxis    Seafood    History of Present Illness    Austin Vaughan has a past medical history of hypertension, diabetes, NSVT, coronary artery disease status post PCI on 07/2002 with stent placement (stent location not outlined in epic).  His MI was treated while he lived in [place].  He presented with bilateral arm and chest pain.  He moved to [place] to be closer to his daughter-in-law and his new grandchild.  He was seen on 10/2018 and reported palpitations.  He had a 14-day Zio patch that showed short runs of PSVT.  His metoprolol was increased to 50 mg.  He was seen on 03/13/2019 and reported pleuritic chest pain that was inconsistent with ACS.  He has been suffering with severe neuropathic pain.    He was again seen on 04/24/2019 by Dr. Oval Linsey and stated that his palpitations and the frequency had not improved since increasing his metoprolol.  He had right lower extremity edema that did not improve with elevation.  He stated that he occasionally became short of breath with walking and especially when walking upstairs.  He also indicated that he had intermittent pain in his lower extremities and the pain would come and go.  He was very concerned that he may  have ALS as he had several family members including his mother that had the disease.  An echocardiogram on 05/05/2019 showed an LV EF of 55-60 %, and normal.  He presented to the emergency department on 06/11/2019 with reports of chest pain.  His EKG was normal with PVCs, troponins were negative, CXR was negative, and his symptoms resolved while in the emergency department.   He follows up in the clinic today for his atypical chest pain and states  He denies chest pain, shortness of breath, lower extremity edema, fatigue, palpitations, melena, hematuria, hemoptysis, diaphoresis, weakness, presyncope, syncope, orthopnea, and PND.    Home Medications    Prior to Admission medications   Medication Sig Start Date End Date Taking? Authorizing Provider  albuterol (PROAIR HFA) 108 (90 Base) MCG/ACT inhaler Inhale 2 puffs into the lungs every 6 (six) hours as needed for wheezing or shortness of breath. 12/17/18   Fulp, [name], MD  amLODipine (NORVASC) 10 MG tablet TAKE 1 TABLET (10 MG TOTAL) BY MOUTH DAILY. TO LOWER BLOOD PRESSURE 01/27/19   Fulp, [name], MD  aspirin EC 81 MG tablet Take 81 mg by mouth daily.    [provider]  atorvastatin (LIPITOR) 80 MG tablet Take 1 tablet (80 mg total) by mouth daily. 04/24/19   Skeet Latch, MD  Blood Glucose Monitoring Suppl (TRUE METRIX METER) w/Device KIT Use daily to check blood sugar 4 times daily. 10/18/18   Shamleffer, Melanie Crazier, MD  Continuous  Blood Gluc Receiver (DEXCOM G6 RECEIVER) DEVI 1 Device by Does not apply route as directed. 04/08/19   Shamleffer, Melanie Crazier, MD  Continuous Blood Gluc Sensor (DEXCOM G6 SENSOR) MISC 1 Device by Does not apply route as directed. 04/08/19   Shamleffer, Melanie Crazier, MD  Continuous Blood Gluc Transmit (DEXCOM G6 TRANSMITTER) MISC 1 Device by Does not apply route as directed. 04/08/19   Shamleffer, Melanie Crazier, MD  dapagliflozin propanediol (FARXIGA) 10 MG TABS tablet Take 10 mg by mouth daily.  01/28/19   Shamleffer, Melanie Crazier, MD  dicyclomine (BENTYL) 20 MG tablet Take 1 tablet (20 mg total) by mouth 4 (four) times daily. As needed for abdominal cramping 08/22/18   Fulp, [name], MD  gabapentin (NEURONTIN) 600 MG tablet TAKE 6 TABLETS (3,600 MG TOTAL) BY MOUTH AT BEDTIME. 05/16/19   Fulp, [name], MD  glucose blood test strip 4x daily 10/18/18   Shamleffer, Melanie Crazier, MD  Insulin Glargine (LANTUS SOLOSTAR) 100 UNIT/ML Solostar Pen Inject 34 Units into the skin daily. Patient taking differently: Inject 40 Units into the skin daily.  02/03/19   Fulp, [name], MD  insulin lispro (HUMALOG KWIKPEN) 100 UNIT/ML KwikPen Inject 18 units into skin 3 times daily with ,eals. Max dosage 60 units daily. DX E11.65 Patient taking differently: Inject 18 Units into the skin 3 (three) times daily. Max dosage 60 units daily. 02/03/19   Fulp, [name], MD  metFORMIN (GLUCOPHAGE) 1000 MG tablet Take 1 tablet (1,000 mg total) by mouth 2 (two) times daily with a meal. 01/28/19   Shamleffer, Melanie Crazier, MD  metoprolol succinate (TOPROL-XL) 50 MG 24 hr tablet Take 1 and 1/2 tablets by mouth daily 04/24/19   Skeet Latch, MD  montelukast (SINGULAIR) 10 MG tablet Take 10 mg by mouth daily.    [provider]  nitroGLYCERIN (NITRODUR - DOSED IN MG/24 HR) 0.4 mg/hr patch Place 1 patch (0.4 mg total) onto the skin daily. 09/17/18   Erlene Quan, PA-C  nitroGLYCERIN (NITROSTAT) 0.4 MG SL tablet Place 0.4 mg under the tongue every 5 (five) minutes as needed for chest pain.    [provider]  nortriptyline (PAMELOR) 50 MG capsule TAKE 1 CAPSULE (50 MG TOTAL) BY MOUTH AT BEDTIME. 05/16/19   Fulp, [name], MD  pregabalin (LYRICA) 75 MG capsule Take 1 capsule (75 mg total) by mouth 3 (three) times daily. Patient taking differently: Take 225 mg by mouth at bedtime.  02/03/19   Fulp, [name], MD  ramipril (ALTACE) 5 MG capsule TAKE 2 CAPSULES (10 MG TOTAL) BY MOUTH DAILY. Patient taking differently: Take  10 mg by mouth daily.  04/25/19   [name], MD    Family History    Family History  Problem Relation Age of Onset   ALS Mother    Lung cancer Father    ALS Maternal Aunt    Heart disease Paternal Grandmother    Heart disease Paternal Grandfather    He indicated that his mother is deceased. He indicated that his father is deceased. He indicated that his sister is alive. He indicated that his brother is alive. He indicated that the status of his paternal grandmother is unknown. He indicated that the status of his paternal grandfather is unknown. He indicated that the status of his maternal aunt is unknown.  Social History    Social History   Socioeconomic History   Marital status: Married    Spouse name: Not on file   Number of children: 2   Years  of education: 12   Highest education level: Not on file  Occupational History   Occupation: disablitiy  Scientist, product/process development strain: Not on file   Food insecurity    Worry: Not on file    Inability: Not on file   Transportation needs    Medical: Not on file    Non-medical: Not on file  Tobacco Use   Smoking status: Never Smoker   Smokeless tobacco: Never Used  Substance and Sexual Activity   Alcohol use: Not Currently   Drug use: Never   Sexual activity: Yes  Lifestyle   Physical activity    Days per week: Not on file    Minutes per session: Not on file   Stress: Not on file  Relationships   Social connections    Talks on phone: Not on file    Gets together: Not on file    Attends religious service: Not on file    Active member of club or organization: Not on file    Attends meetings of clubs or organizations: Not on file    Relationship status: Not on file   Intimate partner violence    Fear of current or ex partner: Not on file    Emotionally abused: Not on file    Physically abused: Not on file    Forced sexual activity: Not on file  Other Topics Concern   Not on file    Social History Narrative   Wife and patient relocated to [place] from [place] a few months ago. - 06/11/18   He is on disability since 2019.     He lives with wife and daughter in a one-level apartment.    Right handed     Review of Systems    General:  No chills, fever, night sweats or weight changes.  Cardiovascular:  No chest pain, dyspnea on exertion, edema, orthopnea, palpitations, paroxysmal nocturnal dyspnea. Dermatological: No rash, lesions/masses Respiratory: No cough, dyspnea Urologic: No hematuria, dysuria Abdominal:   No nausea, vomiting, diarrhea, bright red blood per rectum, melena, or hematemesis Neurologic:  No visual changes, wkns, changes in mental status. All other systems reviewed and are otherwise negative except as noted above.  Physical Exam    VS:  There were no vitals taken for this visit. , BMI There is no height or weight on file to calculate BMI. GEN: Well nourished, well developed, in no acute distress. HEENT: normal. Neck: Supple, no JVD, carotid bruits, or masses. Cardiac: RRR, no murmurs, rubs, or gallops. No clubbing, cyanosis, edema.  Radials/DP/PT 2+ and equal bilaterally.  Respiratory:  Respirations regular and unlabored, clear to auscultation bilaterally. GI: Soft, nontender, nondistended, BS + x 4. MS: no deformity or atrophy. Skin: warm and dry, no rash. Neuro:  Strength and sensation are intact. Psych: Normal affect.  Accessory Clinical Findings    ECG personally reviewed by me today- *** - No acute changes  EKG 06/12/2019 Sinus tachycardia with occasional premature ventricular complexes 115 bpm  Nuclear stress test 04/19/2018 LVEF 53%, no inducible ischemia, inferior defect consistent with diaphragmatic attenuation.  Echocardiogram 05/05/2019 IMPRESSIONS    1. Left ventricular ejection fraction, by visual estimation, is 55 to 60%. The left ventricle has normal function. Normal left ventricular size. There is no left  ventricular hypertrophy.  2. Left ventricular diastolic Doppler parameters are consistent with impaired relaxation pattern of LV diastolic filling.  3. The aortic valve is tricuspid Aortic valve regurgitation was not visualized by color flow Doppler.  Mild aortic valve sclerosis without stenosis.  4. Global right ventricle has normal systolic function.The right ventricular size is normal. No increase in right ventricular wall thickness.  5. Right atrial size was normal.  6. Left atrial size was normal.  7. The inferior vena cava is normal in size with greater than 50% respiratory variability, suggesting right atrial pressure of 3 mmHg.  8. The mitral valve is normal in structure. No evidence of mitral valve regurgitation. No evidence of mitral stenosis.  9. The tricuspid valve is normal in structure. Tricuspid valve regurgitation was not visualized by color flow Doppler. 10. TR signal is inadequate for assessing pulmonary artery systolic pressure.  FINDINGS  Left Ventricle: Left ventricular ejection fraction, by visual estimation, is 55 to 60%. The left ventricle has normal function. No evidence of left ventricular regional wall motion abnormalities. There is no left ventricular hypertrophy. Normal left  ventricular size. Spectral Doppler shows Left ventricular diastolic Doppler parameters are consistent with impaired relaxation pattern of LV diastolic filling.  Right Ventricle: The right ventricular size is normal. No increase in right ventricular wall thickness. Global RV systolic function is has normal systolic function.  Left Atrium: Left atrial size was normal in size.  Right Atrium: Right atrial size was normal in size  Pericardium: There is no evidence of pericardial effusion.  Mitral Valve: The mitral valve is normal in structure. No evidence of mitral valve stenosis by observation. No evidence of mitral valve regurgitation.  Tricuspid Valve: The tricuspid valve is normal in  structure. Tricuspid valve regurgitation was not visualized by color flow Doppler.  Aortic Valve: The aortic valve is tricuspid. Aortic valve regurgitation was not visualized by color flow Doppler. Mild aortic valve sclerosis is present, with no evidence of aortic valve stenosis.  Pulmonic Valve: The pulmonic valve was normal in structure. Pulmonic valve regurgitation is not visualized by color flow Doppler.  Aorta: The aortic root is normal in size and structure.  Venous: The inferior vena cava is normal in size with greater than 50% respiratory variability, suggesting right atrial pressure of 3 mmHg.  IAS/Shunts: No atrial level shunt detected by color flow Doppler  Assessment & Plan    Atypical chest pain-presented to the emergency department on 06/11/2019.  Troponins negative, EKG normal, chest x-ray normal.  Pain resolved emergency department   Coronary artery disease/ chronic angina- status post PCI 2003 with stent placement.  No chest pain today Continue aspirin 81 mg tablet daily Continue atorvastatin 80 mg tablet daily Continue amlodipine 10 mg tablet Continue nitroglycerin 0.4 mg sublingual as needed Palpitations-no palpitations today.  14-day Zio patch showed runs of PSVT. Continue metoprolol 75 mg daily  Hyperlipidemia-04/29/2019: Cholesterol, Total 121; HDL 43; LDL Chol Calc (NIH) 61; Triglycerides 86 Continue atorvastatin 80 mg daily Repeat lipids/CMP in 1 month  Essential hypertension-BP today*** Continue amlodipine 10 mg tablet daily Continue ramipril 10 mg tablet daily Continue metoprolol succinate 75 mg   Disposition: Follow-up with Dr. Oval Linsey in 3 months.  Jossie Ng. [facility] [address] Office [phone] Fax [phone]

## 2019-06-19 ENCOUNTER — Ambulatory Visit: Payer: Medicare Other | Admitting: General Practice

## 2019-06-25 ENCOUNTER — Encounter: Payer: Self-pay | Admitting: General Practice

## 2019-06-25 ENCOUNTER — Ambulatory Visit (INDEPENDENT_AMBULATORY_CARE_PROVIDER_SITE_OTHER): Payer: Medicare Other | Admitting: General Practice

## 2019-06-25 ENCOUNTER — Other Ambulatory Visit: Payer: Self-pay

## 2019-06-25 VITALS — BP 124/86 | HR 79 | Temp 96.6°F | Ht 74.0 in | Wt 242.8 lb

## 2019-06-25 DIAGNOSIS — I2510 Atherosclerotic heart disease of native coronary artery without angina pectoris: Secondary | ICD-10-CM

## 2019-06-25 DIAGNOSIS — R600 Localized edema: Secondary | ICD-10-CM

## 2019-06-25 DIAGNOSIS — I5042 Chronic combined systolic (congestive) and diastolic (congestive) heart failure: Secondary | ICD-10-CM

## 2019-06-25 DIAGNOSIS — E7800 Pure hypercholesterolemia, unspecified: Secondary | ICD-10-CM | POA: Diagnosis not present

## 2019-06-25 DIAGNOSIS — R002 Palpitations: Secondary | ICD-10-CM

## 2019-06-25 DIAGNOSIS — I10 Essential (primary) hypertension: Secondary | ICD-10-CM

## 2019-06-25 MED FILL — $FARXIGA 10 MG TABLET: 10 | 30 days supply | Qty: 30 | Fill #3

## 2019-06-25 MED FILL — metFORMIN HCL 1000 MG TABS: 1000 | 30 days supply | Qty: 60 | Fill #5

## 2019-06-25 MED FILL — ATORVASTATIN 80 MG TABLET: 80 | 30 days supply | Qty: 30 | Fill #2

## 2019-06-25 NOTE — Patient Instructions (Addendum)
Medication Instructions:   Your physician recommends that you continue on your current medications as directed. Please refer to the Current Medication list given to you today.  *If you need a refill on your cardiac medications before your next appointment, please call your pharmacy*  Lab Work:  NONE ordered at this time of appointment   If you have labs (blood work) drawn today and your tests are completely normal, you will receive your results only by: [name] MyChart Message (if you have MyChart) OR . A paper copy in the mail If you have any lab test that is abnormal or we need to change your treatment, we will call you to review the results.  Testing/Procedures:  NONE ordered at this time of appointment   Follow-Up: At [facility], you and your health needs are our priority.  As part of our continuing mission to provide you with exceptional heart care, we have created designated Provider Care Teams.  These Care Teams include your primary Cardiologist (physician) and Advanced Practice Providers (APPs -  Physician Assistants and Nurse Practitioners) who all work together to provide you with the care you need, when you need it.  Your next appointment:   As scheduled   The format for your next appointment:   In Person  Provider:   Chilton Si, MD  Other Instructions  Read information on the [place]   How to Increase Your Level of Physical Activity  Getting regular physical activity is important for your overall health and well-being. Most people do not get enough exercise. There are easy ways to increase your level of physical activity, even if you have not been very active in the past or you are just starting out. Why is physical activity important? Physical activity has many short-term and long-term health benefits. Regular exercise can:  Help you lose weight or maintain a healthy weight.  Strengthen your muscles and bones.  Boost your mood and improve self-esteem.   Reduce your risk of certain long-term (chronic) diseases, like heart disease, cancer, and diabetes.  Help you stay capable of walking and moving around (mobile) as you age.  Prevent accidents, such as falls, as you age.  Increase life expectancy. What are the benefits of being physically active on a regular basis? In addition to improving your physical health, being physically active on most days of the week can help you in ways that you may not expect. Benefits of regular physical activity may include:  Feeling good about your body.  Being able to move around more easily and for longer periods of time without getting tired (increased stamina).  Finding new sources of fun and enjoyment.  Meeting new people who share a common interest.  Being able to fight off illness better (enhanced immunity).  Being able to sleep better. What can happen if I am not physically active on a regular basis? Not getting enough physical activity can lead to an unhealthy lifestyle and future health problems. This can increase your chances of:  Becoming overweight or obese.  Becoming sick.  Developing chronic illnesses, like heart disease or diabetes.  Having mental health problems, like depression or anxiety.  Having sleep problems.  Having trouble walking or getting yourself around (reduced mobility).  Injuring yourself in a fall as you get older. What steps can I take to be more physically active?   Check with your health care provider about how to get started. Ask your health care provider what activities are safe for you.  Start out slowly. Walking or doing some simple chair exercises is a good place to start, especially if you have not been active before or for a long time.  Try to find activities that you enjoy. You are more likely to commit to an exercise routine if it does not feel like a chore.  If you have bone or joint problems, choose low-impact exercises, like walking or swimming.   Include physical activity in your everyday routine.  Invite friends or family members to exercise with you. This also will help you commit to your workout plan.  Set goals that you can work toward.  Aim for at least 150 minutes of moderate-intensity exercise each week. Examples of moderate-intensity exercise include walking or riding a bike. Where to find more information  Centers for Disease Control and Prevention: BowlingGrip.is  President's Council on Fitness, Sports & Nutrition www.http://villegas.org/  ChooseMyPlate: [employer].dk Contact a health care provider if:  You have headaches, muscle aches, or joint pain.  You feel dizzy or light-headed while exercising.  You faint.  You have chest pain while exercising. Summary  Exercise benefits your mind and body at any age, even if you are just starting out.  If you have a chronic illness or have not been active for a while, check with your health care provider before increasing your physical activity.  Choose activities that are safe and enjoyable for you. Ask your health care provider what activities are safe for you.  Start slowly. Tell your health care provider if you have problems as you start to increase your activity level. This information is not intended to replace advice given to you by your health care provider. Make sure you discuss any questions you have with your health care provider. Document Released: 07/20/2016 Document Revised: 11/25/2018 Document Reviewed: 07/20/2016 Elsevier Patient Education  2020 [employer].

## 2019-06-25 NOTE — Progress Notes (Addendum)
Cardiology Clinic Note   Patient Name: Austin Vaughan Date of Encounter: 06/25/2019  Primary Care Provider:  [name], MD Primary Cardiologist:  Skeet Latch, MD  Patient Profile    Austin Vaughan 48 year old male presents today for follow-up of his essential hypertension, coronary artery disease, palpitations, hyperlipidemia, and PSVT.  Past Medical History    Past Medical History:  Diagnosis Date   Coronary artery disease    Diabetes mellitus without complication ([place])    Hypertension    Past Surgical History:  Procedure Laterality Date   CORONARY ANGIOPLASTY WITH STENT PLACEMENT  07/2002    Allergies  Allergies  Allergen Reactions   Ambien [Zolpidem Tartrate] Other (See Comments)    HALLUCINATIONS   Shellfish Allergy Anaphylaxis    Seafood    History of Present Illness    Austin Vaughan has a past medical history of hypertension, diabetes, NSVT, and coronary artery disease with remote PCI while he lived in [place] (2013).  With his previous MI in 2013 he presented with bilateral arm and chest pain.  His PCI, vessel unknown, showed a LVEF of 47%(records not in epic).  Prior to moving to [place] he underwent a nuclear stress test on 04/2018 which showed no ischemia and an LVEF of 53%.  He moved from [place] to [place] to be closer to his daughter-in-law who had a baby.  During his initial appointment with Dr. Oval Linsey it was noted that he was having intermittent chest pain but was not using his prescribed nitroglycerin patch daily, daily use was recommended.  He saw Kerin Ransom, PA-C on 10/2018 with reports of palpitations.  He was given a 14-day Zio patch which showed short runs of PSVT.  His metoprolol was increased to 50 mg daily.  On follow-up with Fabian Sharp PA on 03/13/2019 he reported pleuritic type chest pain that was not consistent with ACS.  It was noted that he was suffering severe neuropathic pain.  On his follow-up on 04/24/2019 with Dr.  Oval Linsey he reported frequent palpitations.  He indicated that his symptoms had not improved since increasing his metoprolol.  He had right greater than left lower extremity edema that did not improve with elevating his extremity.  He reported intermittent periods of dyspnea with walking and with climbing stairs.  He stated he was barely eating but gaining weight and noted pain in his lower extremities.  He was mostly struggling with his neuropathic pain from peripheral neuropathy at that time.  He also indicated he was very concerned he may have ALS due to several of his family members having the disease including his mother.  He presents the clinic today and states he has not had chest pain or discomfort since his visit to the emergency department on 10/28.  He states that he had chest pain that was not relieved by his nitroglycerin.  He states he placed a nitroglycerin patch on his skin and proceeded to use 1 nitroglycerin under his tongue.  He describes the pain as sudden and sharp over the left side of his chest.  The pain lasted for about 3 minutes and subsided.  He presented to the emergency department where ACS was ruled out.  His troponins were less than 2.  His did not show any ST elevation.  He has not had recurrence of chest pain or discomfort since the event.  He states he is very sedentary at home.  When asked about his diet he states he eats lots of rice  and Hispanic type foods.  However, he does try to avoid salt.  He denies chest pain, shortness of breath, lower extremity edema, fatigue, melena, hematuria, hemoptysis, diaphoresis, weakness, presyncope, syncope, orthopnea, and PND.  Home Medications    Prior to Admission medications   Medication Sig Start Date End Date Taking? Authorizing Provider  albuterol (PROAIR HFA) 108 (90 Base) MCG/ACT inhaler Inhale 2 puffs into the lungs every 6 (six) hours as needed for wheezing or shortness of breath. 12/17/18   Fulp, [name], MD  amLODipine  (NORVASC) 10 MG tablet TAKE 1 TABLET (10 MG TOTAL) BY MOUTH DAILY. TO LOWER BLOOD PRESSURE 01/27/19   Fulp, [name], MD  aspirin EC 81 MG tablet Take 81 mg by mouth daily.    [provider]  atorvastatin (LIPITOR) 80 MG tablet Take 1 tablet (80 mg total) by mouth daily. 04/24/19   Skeet Latch, MD  Blood Glucose Monitoring Suppl (TRUE METRIX METER) w/Device KIT Use daily to check blood sugar 4 times daily. 10/18/18   Shamleffer, Melanie Crazier, MD  Continuous Blood Gluc Receiver (DEXCOM G6 RECEIVER) DEVI 1 Device by Does not apply route as directed. 04/08/19   Shamleffer, Melanie Crazier, MD  Continuous Blood Gluc Sensor (DEXCOM G6 SENSOR) MISC 1 Device by Does not apply route as directed. 04/08/19   Shamleffer, Melanie Crazier, MD  Continuous Blood Gluc Transmit (DEXCOM G6 TRANSMITTER) MISC 1 Device by Does not apply route as directed. 04/08/19   Shamleffer, Melanie Crazier, MD  dapagliflozin propanediol (FARXIGA) 10 MG TABS tablet Take 10 mg by mouth daily. 01/28/19   Shamleffer, Melanie Crazier, MD  dicyclomine (BENTYL) 20 MG tablet Take 1 tablet (20 mg total) by mouth 4 (four) times daily. As needed for abdominal cramping 08/22/18   Fulp, [name], MD  gabapentin (NEURONTIN) 600 MG tablet TAKE 6 TABLETS (3,600 MG TOTAL) BY MOUTH AT BEDTIME. 05/16/19   Fulp, [name], MD  glucose blood test strip 4x daily 10/18/18   Shamleffer, Melanie Crazier, MD  Insulin Glargine (LANTUS SOLOSTAR) 100 UNIT/ML Solostar Pen Inject 34 Units into the skin daily. Patient taking differently: Inject 40 Units into the skin daily.  02/03/19   Fulp, [name], MD  insulin lispro (HUMALOG KWIKPEN) 100 UNIT/ML KwikPen Inject 18 units into skin 3 times daily with ,eals. Max dosage 60 units daily. DX E11.65 Patient taking differently: Inject 18 Units into the skin 3 (three) times daily. Max dosage 60 units daily. 02/03/19   Fulp, [name], MD  metFORMIN (GLUCOPHAGE) 1000 MG tablet Take 1 tablet (1,000 mg total) by mouth 2 (two) times  daily with a meal. 01/28/19   Shamleffer, Melanie Crazier, MD  metoprolol succinate (TOPROL-XL) 50 MG 24 hr tablet Take 1 and 1/2 tablets by mouth daily 04/24/19   Skeet Latch, MD  montelukast (SINGULAIR) 10 MG tablet Take 10 mg by mouth daily.    [provider]  nitroGLYCERIN (NITRODUR - DOSED IN MG/24 HR) 0.4 mg/hr patch Place 1 patch (0.4 mg total) onto the skin daily. 09/17/18   Erlene Quan, PA-C  nitroGLYCERIN (NITROSTAT) 0.4 MG SL tablet Place 0.4 mg under the tongue every 5 (five) minutes as needed for chest pain.    [provider]  nortriptyline (PAMELOR) 50 MG capsule TAKE 1 CAPSULE (50 MG TOTAL) BY MOUTH AT BEDTIME. 05/16/19   Fulp, [name], MD  pregabalin (LYRICA) 75 MG capsule Take 1 capsule (75 mg total) by mouth 3 (three) times daily. Patient taking differently: Take 225 mg by mouth at bedtime.  02/03/19  Fulp, [name], MD  ramipril (ALTACE) 5 MG capsule TAKE 2 CAPSULES (10 MG TOTAL) BY MOUTH DAILY. Patient taking differently: Take 10 mg by mouth daily.  04/25/19   [name], MD    Family History    Family History  Problem Relation Age of Onset   ALS Mother    Lung cancer Father    ALS Maternal Aunt    Heart disease Paternal Grandmother    Heart disease Paternal Grandfather    He indicated that his mother is deceased. He indicated that his father is deceased. He indicated that his sister is alive. He indicated that his brother is alive. He indicated that the status of his paternal grandmother is unknown. He indicated that the status of his paternal grandfather is unknown. He indicated that the status of his maternal aunt is unknown.  Social History    Social History   Socioeconomic History   Marital status: Married    Spouse name: Not on file   Number of children: 2   Years of education: 12   Highest education level: Not on file  Occupational History   Occupation: disablitiy  Scientist, product/process development strain: Not on file     Food insecurity    Worry: Not on file    Inability: Not on file   Transportation needs    Medical: Not on file    Non-medical: Not on file  Tobacco Use   Smoking status: Never Smoker   Smokeless tobacco: Never Used  Substance and Sexual Activity   Alcohol use: Not Currently   Drug use: Never   Sexual activity: Yes  Lifestyle   Physical activity    Days per week: Not on file    Minutes per session: Not on file   Stress: Not on file  Relationships   Social connections    Talks on phone: Not on file    Gets together: Not on file    Attends religious service: Not on file    Active member of club or organization: Not on file    Attends meetings of clubs or organizations: Not on file    Relationship status: Not on file   Intimate partner violence    Fear of current or ex partner: Not on file    Emotionally abused: Not on file    Physically abused: Not on file    Forced sexual activity: Not on file  Other Topics Concern   Not on file  Social History Narrative   Wife and patient relocated to [place] from [place] a few months ago. - 06/11/18   He is on disability since 2019.     He lives with wife and daughter in a one-level apartment.    Right handed     Review of Systems     General:  No chills, fever, night sweats or weight changes.  Cardiovascular:  No chest pain, dyspnea on exertion, edema, orthopnea, palpitations, paroxysmal nocturnal dyspnea. Dermatological: No rash, lesions/masses Respiratory: No cough, dyspnea Urologic: No hematuria, dysuria Abdominal:   No nausea, vomiting, diarrhea, bright red blood per rectum, melena, or hematemesis Neurologic:  No visual changes, wkns, changes in mental status. All other systems reviewed and are otherwise negative except as noted above.  Physical Exam    VS:  BP 124/86    Pulse 79    Temp (!) 96.6 F (35.9 C)    Ht 6' 2"  (1.88 m)    Wt 242 lb 12.8 oz (110.1 kg)  SpO2 94%    BMI 31.17 kg/m  , BMI Body mass  index is 31.17 kg/m. GEN: Well nourished, well developed, in no acute distress. HEENT: normal. Neck: Supple, no JVD, carotid bruits, or masses. Cardiac: RRR, no murmurs, rubs, or gallops. No clubbing, cyanosis, edema.  Radials/DP/PT 2+ and equal bilaterally.  Respiratory:  Respirations regular and unlabored, clear to auscultation bilaterally. GI: Soft, nontender, nondistended, BS + x 4. MS: no deformity or atrophy. Skin: warm and dry, no rash. Neuro:  Strength and sensation are intact. Psych: Normal affect.  Accessory Clinical Findings    ECG personally reviewed by me today-normal sinus rhythm 79 bpm- No acute changes  EKG 06/12/2019 Sinus tachycardia with occasional PVCs 115 bpm  Echocardiogram 05/05/2019 IMPRESSIONS    1. Left ventricular ejection fraction, by visual estimation, is 55 to 60%. The left ventricle has normal function. Normal left ventricular size. There is no left ventricular hypertrophy.  2. Left ventricular diastolic Doppler parameters are consistent with impaired relaxation pattern of LV diastolic filling.  3. The aortic valve is tricuspid Aortic valve regurgitation was not visualized by color flow Doppler. Mild aortic valve sclerosis without stenosis.  4. Global right ventricle has normal systolic function.The right ventricular size is normal. No increase in right ventricular wall thickness.  5. Right atrial size was normal.  6. Left atrial size was normal.  7. The inferior vena cava is normal in size with greater than 50% respiratory variability, suggesting right atrial pressure of 3 mmHg.  8. The mitral valve is normal in structure. No evidence of mitral valve regurgitation. No evidence of mitral stenosis.  9. The tricuspid valve is normal in structure. Tricuspid valve regurgitation was not visualized by color flow Doppler. 10. TR signal is inadequate for assessing pulmonary artery systolic pressure.   Nuclear stress test 04/19/2018  No ischemia and an LVEF  of 53%. Scanned in external report  Long-term cardiac monitor 10/01/2018 14-day ZIO Quality: Fair.  Baseline artifact. Predominant rhythm: sinus rhythm Average heart rate: 90 bpm Max heart rate: 136 bpm Min heart rate: 54 bpm  Rare PACs and PVCs Short runs of SVT noted   Assessment & Plan   1.  Palpitations-14-day ZIO monitor from 09/2016 showed predominantly normal sinus rhythm with an average rate of 90 bpm.  It also showed rare PACs and PVCs with short runs of SVT. Continue metoprolol succinate 75 mg tablet daily  Coronary artery disease-no chest pain today.  Recently presented to the hospital on 10/28.  ACS was ruled out he has had no further episodes of chest pain. Continue aspirin 81 mg tablet daily Continue Norvasc 10 mg tablet daily Continue metoprolol succinate 75 mg tablet daily Continue ramipril 10 mg tablet daily Continue nitroglycerin 0.4 mg sublingual tablet as needed  Hyperlipidemia-04/29/2019: Cholesterol, Total 121; HDL 43; LDL Chol Calc (NIH) 61; Triglycerides 86 Continue atorvastatin 80 mg tablet daily Heart healthy low-sodium high-fiber diet Increase physical activity as tolerated  Essential hypertension-BP today 124/86.  Well-controlled at home. Continue ramipril 10 mg tablet daily Continue Norvasc 10 mg tablet daily Continue metoprolol succinate 75 mg tablet daily Heart healthy low-sodium diet-salty 6 given Increase physical activity as tolerated  Chronic systolic diastolic heart failure/lower extremity edema- -echocardiogram 04/2019 showed LVEF 55 to 60% and impaired diastolic relaxation.Mild aortic valve sclerosis without stenosis. Heart healthy low-sodium diet-salty 6 given Elevate extremities throughout the day-extremities must be higher than heart.   Disposition: Follow-up with Dr. Oval Linsey in 3 months.  Jossie Ng. Cj Edgell NP-C  [facility] [place] [address] Office [phone] Fax [phone]

## 2019-06-27 ENCOUNTER — Other Ambulatory Visit: Payer: Self-pay

## 2019-06-27 ENCOUNTER — Other Ambulatory Visit: Payer: Self-pay | Admitting: Pharmacist

## 2019-06-27 DIAGNOSIS — E0842 Diabetes mellitus due to underlying condition with diabetic polyneuropathy: Secondary | ICD-10-CM

## 2019-06-27 MED ORDER — GABAPENTIN 600 MG PO TABS: 3600.0000 mg | ORAL_TABLET | Freq: Every day | ORAL | 1 refills | Status: DC

## 2019-06-27 MED FILL — GABAPENTIN 600 MG TABLET: 600 | 20 days supply | Qty: 120 | Fill #0

## 2019-06-27 NOTE — Progress Notes (Signed)
Pt requesting gabapentin refill. Processed by CMA.

## 2019-07-02 MED FILL — RAMIPRIL 5 MG CAPS: 5 | 30 days supply | Qty: 60 | Fill #2

## 2019-07-06 ENCOUNTER — Encounter: Payer: Self-pay | Admitting: Family Medicine

## 2019-07-08 ENCOUNTER — Other Ambulatory Visit: Payer: Self-pay | Admitting: Family Medicine

## 2019-07-08 DIAGNOSIS — R197 Diarrhea, unspecified: Secondary | ICD-10-CM

## 2019-07-08 DIAGNOSIS — R1084 Generalized abdominal pain: Secondary | ICD-10-CM

## 2019-07-08 NOTE — Progress Notes (Signed)
Patient ID: Austin Vaughan, male   DOB: 08-Jul-1971, 48 y.o.   MRN: [id number]   48 yo male with history of poorly controlled diabetes with complaint of abdominal pain. Is currently taking bentyl which is no longer helping. GI referral being placed.

## 2019-07-14 ENCOUNTER — Encounter: Payer: Self-pay | Admitting: Gastroenterology

## 2019-07-14 MED FILL — $LANTUS SOLOSTAR 100 UNITS/: 100 | 35 days supply | Qty: 12 | Fill #1

## 2019-07-14 MED FILL — ?METOPROLOL SUCC ER 50MG TA: 50 | 30 days supply | Qty: 45 | Fill #2

## 2019-07-15 MED FILL — GABAPENTIN 600 MG TABLET: 600 | 20 days supply | Qty: 120 | Fill #1

## 2019-07-15 MED FILL — NORTRIPTYLINE HCL 50 MG CAP: 50 | 30 days supply | Qty: 30 | Fill #2

## 2019-07-28 ENCOUNTER — Other Ambulatory Visit: Payer: Self-pay | Admitting: Family Medicine

## 2019-07-28 MED FILL — ATORVASTATIN 80 MG TABLET: 80 | 30 days supply | Qty: 30 | Fill #3

## 2019-07-28 MED FILL — RAMIPRIL 5 MG CAPS: 5 | 30 days supply | Qty: 60 | Fill #0

## 2019-07-28 MED FILL — metFORMIN HCL 1000 MG TABS: 1000 | 30 days supply | Qty: 60 | Fill #6

## 2019-07-28 MED FILL — $FARXIGA 10 MG TABLET: 10 | 30 days supply | Qty: 30 | Fill #4

## 2019-07-29 ENCOUNTER — Ambulatory Visit: Payer: Medicare Other | Admitting: Cardiovascular Disease

## 2019-08-04 ENCOUNTER — Other Ambulatory Visit: Payer: Self-pay | Admitting: Family Medicine

## 2019-08-04 ENCOUNTER — Encounter: Payer: Self-pay | Admitting: Family Medicine

## 2019-08-04 DIAGNOSIS — E0842 Diabetes mellitus due to underlying condition with diabetic polyneuropathy: Secondary | ICD-10-CM

## 2019-08-05 MED FILL — GABAPENTIN 600 MG TABLET: 600 | 30 days supply | Qty: 180 | Fill #0

## 2019-08-12 ENCOUNTER — Other Ambulatory Visit: Payer: Self-pay | Admitting: Family Medicine

## 2019-08-12 ENCOUNTER — Encounter: Payer: Self-pay | Admitting: Family Medicine

## 2019-08-12 ENCOUNTER — Telehealth: Payer: Self-pay

## 2019-08-12 ENCOUNTER — Other Ambulatory Visit: Payer: Self-pay | Admitting: Nurse Practitioner

## 2019-08-12 DIAGNOSIS — G5793 Unspecified mononeuropathy of bilateral lower limbs: Secondary | ICD-10-CM

## 2019-08-12 DIAGNOSIS — E1140 Type 2 diabetes mellitus with diabetic neuropathy, unspecified: Secondary | ICD-10-CM

## 2019-08-12 NOTE — Telephone Encounter (Signed)
Pt sent a MyChart message requesting a new referral sent [facility] in [place]?. Please respond

## 2019-08-13 ENCOUNTER — Other Ambulatory Visit: Payer: Self-pay | Admitting: Family Medicine

## 2019-08-13 DIAGNOSIS — G5793 Unspecified mononeuropathy of bilateral lower limbs: Secondary | ICD-10-CM

## 2019-08-13 MED FILL — NORTRIPTYLINE HCL 50 MG CAP: 50 | 30 days supply | Qty: 30 | Fill #0

## 2019-08-13 MED FILL — METOPROLOL SUCCINATE ER 50: 50 | 30 days supply | Qty: 45 | Fill #3

## 2019-08-14 ENCOUNTER — Encounter: Payer: Medicare Other | Admitting: Psychology

## 2019-08-14 ENCOUNTER — Other Ambulatory Visit: Payer: Self-pay | Admitting: Family Medicine

## 2019-08-14 DIAGNOSIS — G5793 Unspecified mononeuropathy of bilateral lower limbs: Secondary | ICD-10-CM

## 2019-08-14 MED ORDER — PREGABALIN 75 MG PO CAPS: 75.0000 mg | ORAL_CAPSULE | Freq: Three times a day (TID) | ORAL | 2 refills | Status: DC

## 2019-08-14 NOTE — Telephone Encounter (Signed)
Message mistakenly sent to [place]

## 2019-08-14 NOTE — Progress Notes (Signed)
Patient ID: Austin Vaughan, male   DOB: 24-Dec-1970, 48 y.o.   MRN: [id number]   Message received to patient requesting refill of Lyrica.  Patient accidentally sent message to a different physician who then forwarded the message to me.  [facility]

## 2019-08-16 ENCOUNTER — Encounter: Payer: Self-pay | Admitting: Family Medicine

## 2019-08-16 ENCOUNTER — Encounter: Payer: Self-pay | Admitting: Internal Medicine

## 2019-08-17 ENCOUNTER — Ambulatory Visit
Admission: EM | Admit: 2019-08-17 | Discharge: 2019-08-17 | Disposition: A | Discharge status: Home / Self Care | Payer: Medicare Other | Attending: Emergency Medicine | Admitting: Emergency Medicine

## 2019-08-17 ENCOUNTER — Telehealth: Payer: Medicare Other

## 2019-08-17 ENCOUNTER — Encounter: Payer: Self-pay | Admitting: Emergency Medicine

## 2019-08-17 ENCOUNTER — Other Ambulatory Visit: Payer: Self-pay

## 2019-08-17 DIAGNOSIS — L97511 Non-pressure chronic ulcer of other part of right foot limited to breakdown of skin: Secondary | ICD-10-CM | POA: Diagnosis not present

## 2019-08-17 DIAGNOSIS — E11621 Type 2 diabetes mellitus with foot ulcer: Secondary | ICD-10-CM

## 2019-08-17 DIAGNOSIS — I10 Essential (primary) hypertension: Secondary | ICD-10-CM

## 2019-08-17 MED ORDER — DOXYCYCLINE HYCLATE 100 MG PO CAPS: 100.0000 mg | ORAL_CAPSULE | Freq: Two times a day (BID) | ORAL | 0 refills | Status: DC

## 2019-08-17 NOTE — ED Provider Notes (Signed)
[facility]    CSN: [id number] Arrival date & time: 08/17/19  1239      History   Chief Complaint Chief Complaint  Patient presents with  . Foot wound    HPI Austin Vaughan is a 49 y.o. male with history of hypertension, CAD, diabetes presenting for 3-day course of wound to right second toe.  Denies inciting event, trauma to the area, pain.  Patient does have history of diabetic neuropathy.  Has tried keeping area clean and dry.  Denies discharge, fever, arthralgias.  States he is compliant with diabetic medications.  Denies symptoms of poor control.    Past Medical History:  Diagnosis Date  . Coronary artery disease   . Diabetes mellitus without complication ([place])   . Hypertension     Patient Active Problem List   Diagnosis Date Noted  . Neuropathy 12/17/2018  . Diabetes mellitus ([place]) 11/28/2018  . Type 2 diabetes mellitus with hyperglycemia, with long-term current use of insulin ([place]) 11/28/2018  . Type 2 diabetes mellitus with diabetic polyneuropathy, with long-term current use of insulin ([place]) 11/28/2018  . PSVT (paroxysmal supraventricular tachycardia) ([place]) 10/22/2018  . Diabetic polyneuropathy associated with type 2 diabetes mellitus ([place]) 09/20/2018  . Ulnar neuropathy of both upper extremities 09/20/2018  . Palpitations 09/17/2018  . CAD S/P percutaneous coronary angioplasty 09/17/2018  . Dyslipidemia, goal LDL below 70 09/17/2018  . Insulin dependent diabetes mellitus with complications 00/93/8182  . Painful diabetic neuropathy ([place]) 07/26/2018  . Hypertension     Past Surgical History:  Procedure Laterality Date  . CORONARY ANGIOPLASTY WITH STENT PLACEMENT  07/2002       Home Medications    Prior to Admission medications   Medication Sig Start Date End Date Taking? Authorizing Provider  albuterol (PROAIR HFA) 108 (90 Base) MCG/ACT inhaler Inhale 2 puffs into the lungs every 6 (six) hours as needed for wheezing or shortness of breath.  12/17/18   Fulp, [name], MD  amLODipine (NORVASC) 10 MG tablet TAKE 1 TABLET (10 MG TOTAL) BY MOUTH DAILY. TO LOWER BLOOD PRESSURE 01/27/19   Fulp, [name], MD  aspirin EC 81 MG tablet Take 81 mg by mouth daily.    [provider]  atorvastatin (LIPITOR) 80 MG tablet Take 1 tablet (80 mg total) by mouth daily. 04/24/19   Skeet Latch, MD  Blood Glucose Monitoring Suppl (TRUE METRIX METER) w/Device KIT Use daily to check blood sugar 4 times daily. 10/18/18   Shamleffer, Melanie Crazier, MD  dapagliflozin propanediol (FARXIGA) 10 MG TABS tablet Take 10 mg by mouth daily. 01/28/19   Shamleffer, Melanie Crazier, MD  dicyclomine (BENTYL) 20 MG tablet Take 1 tablet (20 mg total) by mouth 4 (four) times daily. As needed for abdominal cramping 08/22/18   Fulp, [name], MD  doxycycline (VIBRAMYCIN) 100 MG capsule Take 1 capsule (100 mg total) by mouth 2 (two) times daily for 7 days. 08/17/19 08/24/19  Hall-Potvin, [place], PA-C  gabapentin (NEURONTIN) 600 MG tablet TAKE 6 TABLETS (3,600 MG TOTAL) BY MOUTH AT BEDTIME. 08/05/19   Fulp, [name], MD  glucose blood test strip 4x daily 10/18/18   Shamleffer, Melanie Crazier, MD  Insulin Glargine (LANTUS SOLOSTAR) 100 UNIT/ML Solostar Pen Inject 34 Units into the skin daily. Patient taking differently: Inject 40 Units into the skin daily.  02/03/19   Fulp, [name], MD  insulin lispro (HUMALOG KWIKPEN) 100 UNIT/ML KwikPen Inject 18 units into skin 3 times daily with ,eals. Max dosage 60 units daily. DX E11.65 Patient taking  differently: Inject 18 Units into the skin 3 (three) times daily. Max dosage 60 units daily. 02/03/19   Fulp, [name], MD  metFORMIN (GLUCOPHAGE) 1000 MG tablet Take 1 tablet (1,000 mg total) by mouth 2 (two) times daily with a meal. 01/28/19   Shamleffer, Melanie Crazier, MD  metoprolol succinate (TOPROL-XL) 50 MG 24 hr tablet Take 1 and 1/2 tablets by mouth daily 04/24/19   Skeet Latch, MD  montelukast (SINGULAIR) 10 MG tablet Take 10 mg by mouth  daily.    [provider]  nitroGLYCERIN (NITRODUR - DOSED IN MG/24 HR) 0.4 mg/hr patch Place 1 patch (0.4 mg total) onto the skin daily. 09/17/18   Erlene Quan, PA-C  nitroGLYCERIN (NITROSTAT) 0.4 MG SL tablet Place 0.4 mg under the tongue every 5 (five) minutes as needed for chest pain.    [provider]  nortriptyline (PAMELOR) 50 MG capsule TAKE 1 CAPSULE (50 MG TOTAL) BY MOUTH AT BEDTIME. 08/13/19   Fulp, [name], MD  pregabalin (LYRICA) 75 MG capsule Take 1 capsule (75 mg total) by mouth 3 (three) times daily. 08/14/19   Fulp, [name], MD  ramipril (ALTACE) 5 MG capsule TAKE 2 CAPSULES (10 MG TOTAL) BY MOUTH DAILY. 07/28/19   [name], MD    Family History Family History  Problem Relation Age of Onset  . ALS Mother   . Lung cancer Father   . ALS Maternal Aunt   . Heart disease Paternal Grandmother   . Heart disease Paternal Grandfather     Social History Social History   Tobacco Use  . Smoking status: Never Smoker  . Smokeless tobacco: Never Used  Substance Use Topics  . Alcohol use: Not Currently  . Drug use: Never     Allergies   Ambien [zolpidem tartrate] and Shellfish allergy   Review of Systems Review of Systems  Constitutional: Negative for fatigue and fever.  Respiratory: Negative for cough and shortness of breath.   Cardiovascular: Negative for chest pain and palpitations.  Gastrointestinal: Negative for abdominal pain, diarrhea and vomiting.  Musculoskeletal: Negative for arthralgias and myalgias.  Skin: Positive for wound. Negative for rash.  Neurological: Negative for speech difficulty and headaches.  All other systems reviewed and are negative.    Physical Exam Triage Vital Signs ED Triage Vitals [08/17/19 1312]  Enc Vitals Group     BP (!) 133/92     Pulse Rate 95     Resp 16     Temp 97.9 F (36.6 C)     Temp Source Temporal     SpO2 94 %     Weight      Height      Head Circumference      Peak Flow      Pain  Score      Pain Loc      Pain Edu?      Excl. in [place]?    No data found.  Updated Vital Signs BP (!) 133/92 (BP Location: Left Arm)   Pulse 95   Temp 97.9 F (36.6 C) (Temporal)   Resp 16   SpO2 94%   Visual Acuity Right Eye Distance:   Left Eye Distance:   Bilateral Distance:    Right Eye Near:   Left Eye Near:    Bilateral Near:     Physical Exam Constitutional:      General: He is not in acute distress. HENT:     Head: Normocephalic and atraumatic.  Eyes:  General: No scleral icterus.    Pupils: Pupils are equal, round, and reactive to light.  Cardiovascular:     Rate and Rhythm: Normal rate.  Pulmonary:     Effort: Pulmonary effort is normal. No respiratory distress.     Breath sounds: No wheezing.  Musculoskeletal:        General: No deformity. Normal range of motion.  Skin:    Capillary Refill: Capillary refill takes less than 2 seconds.     Coloration: Skin is not jaundiced or pale.     Comments: Right second toe with superficial, nontender degradation of epidermal layer and lateral aspect.  Area is moist, nonmalodorous, and without discharge.  Nontender, though patient does have decreased sensation in feet bilaterally secondary to diabetic neuropathy.  Neurological:     Mental Status: He is alert and oriented to person, place, and time.     Gait: Gait normal.     Deep Tendon Reflexes: Reflexes normal.      UC Treatments / Results  Labs (all labs ordered are listed, but only abnormal results are displayed) Labs Reviewed - No data to display  EKG   Radiology No results found.  Procedures Procedures (including critical care time)  Medications Ordered in UC Medications - No data to display  Initial Impression / Assessment and Plan / UC Course  I have reviewed the triage vital signs and the nursing notes.  Pertinent labs & imaging results that were available during my care of the patient were reviewed by me and considered in my medical  decision making (see chart for details).     Patient nontoxic, afebrile.  Given history diabetes, diabetic neuropathy, dressing was applied by nursing staff, will start antibiotic, and have patient follow-up with podiatry this week for further diabetic foot care.  Return precautions discussed, patient verbalized understanding and is agreeable to plan. Final Clinical Impressions(s) / UC Diagnoses   Final diagnoses:  Diabetic ulcer of toe of right foot associated with type 2 diabetes mellitus, limited to breakdown of skin [facility])     Discharge Instructions     Take antibiotic as directed. Important to keep wound clean, dry. Do dressing changes once daily, very important to follow-up with podiatry in 1 week. Return for worsening pain, spreading of wound, fever.    ED Prescriptions    Medication Sig Dispense Auth. Provider   doxycycline (VIBRAMYCIN) 100 MG capsule Take 1 capsule (100 mg total) by mouth 2 (two) times daily for 7 days. 14 capsule Hall-Potvin, [place], PA-C     PDMP not reviewed this encounter.   Hall-Potvin, [place], [place] 08/17/19 1646

## 2019-08-17 NOTE — Discharge Instructions (Signed)
Take antibiotic as directed. Important to keep wound clean, dry. Do dressing changes once daily, very important to follow-up with podiatry in 1 week. Return for worsening pain, spreading of wound, fever.

## 2019-08-17 NOTE — ED Triage Notes (Signed)
Pt presents to [facility] for assessment of wound second toe of the right foot x 3 days.  Hx of diabetes and neuropathy.  Area appears like a moisture avulsion, no active bleeding, very small amount of purulent discharge on bandage when removed.

## 2019-08-18 ENCOUNTER — Ambulatory Visit: Payer: Medicare Other | Admitting: Gastroenterology

## 2019-08-18 MED ORDER — PREGABALIN 150 MG PO CAPS: 150.0000 mg | ORAL_CAPSULE | Freq: Two times a day (BID) | ORAL | 1 refills | Status: DC

## 2019-08-18 MED FILL — DOXYCYCLINE HYCLATE 100 MG: 100 | 7 days supply | Qty: 14 | Fill #0

## 2019-08-18 NOTE — Telephone Encounter (Signed)
Message from patient

## 2019-08-18 NOTE — Telephone Encounter (Signed)
Contacted pt to schedule an Urgent care appointment. Looks of it pt has an appointment on 1/8

## 2019-08-19 ENCOUNTER — Encounter: Payer: Self-pay | Admitting: Family Medicine

## 2019-08-19 ENCOUNTER — Other Ambulatory Visit: Payer: Self-pay

## 2019-08-19 DIAGNOSIS — G5793 Unspecified mononeuropathy of bilateral lower limbs: Secondary | ICD-10-CM

## 2019-08-19 MED ORDER — PREGABALIN 75 MG PO CAPS: 150.0000 mg | ORAL_CAPSULE | Freq: Two times a day (BID) | ORAL | 2 refills | Status: DC

## 2019-08-19 MED FILL — PREGABALIN 75 MG CAPS: 75 | 30 days supply | Qty: 120 | Fill #0

## 2019-08-19 NOTE — Telephone Encounter (Signed)
Has to be E-scribed now

## 2019-08-20 ENCOUNTER — Ambulatory Visit: Payer: Medicare Other | Admitting: Podiatry

## 2019-08-21 ENCOUNTER — Other Ambulatory Visit: Payer: Self-pay

## 2019-08-21 ENCOUNTER — Encounter: Payer: Self-pay | Admitting: Podiatry

## 2019-08-21 ENCOUNTER — Ambulatory Visit (INDEPENDENT_AMBULATORY_CARE_PROVIDER_SITE_OTHER): Payer: Medicare Other | Admitting: Podiatry

## 2019-08-21 ENCOUNTER — Ambulatory Visit (INDEPENDENT_AMBULATORY_CARE_PROVIDER_SITE_OTHER): Payer: Medicare Other

## 2019-08-21 VITALS — BP 136/90

## 2019-08-21 DIAGNOSIS — E1142 Type 2 diabetes mellitus with diabetic polyneuropathy: Secondary | ICD-10-CM

## 2019-08-21 DIAGNOSIS — L97511 Non-pressure chronic ulcer of other part of right foot limited to breakdown of skin: Secondary | ICD-10-CM | POA: Diagnosis not present

## 2019-08-21 DIAGNOSIS — Z794 Long term (current) use of insulin: Secondary | ICD-10-CM | POA: Diagnosis not present

## 2019-08-21 DIAGNOSIS — M2041 Other hammer toe(s) (acquired), right foot: Secondary | ICD-10-CM

## 2019-08-21 MED ORDER — SILVER SULFADIAZINE 1 % EX CREA: TOPICAL_CREAM | CUTANEOUS | 0 refills | Status: DC

## 2019-08-21 NOTE — Progress Notes (Signed)
  Subjective:  Patient ID: Austin Vaughan, male    DOB: [birth year],  MRN: [id number]  Chief Complaint  Patient presents with  . Foot Ulcer    pt has a diabetic ulcer of the right second toe lateral side, pt states that it has been going on for about a week, pt also states that pain is elevated at night time, or in the cold.     49 y.o. male presents for wound care. Hx confirmed with patient. Unsure how the wound started. Objective:  Physical Exam: Wound Location: right lateral second toe Wound Measurement: 4x0.3 Wound Base: Granular/Healthy Peri-wound: Normal Exudate: Scant/small amount Serosanguinous exudate wound without warmth, erythema, signs of acute infection  Hammertoes right foot   Radiographs:  X-ray of the right foot: no fracture, dislocation, swelling or degenerative changes noted, no osseous erosions Assessment:   1. Perforating ulcer of right foot, limited to breakdown of skin (HCC)   2. Type 2 diabetes mellitus with diabetic polyneuropathy, with long-term current use of insulin (HCC)   3. Hammer toe of right foot    Plan:  Patient was evaluated and treated and all questions answered.  Ulcer Right second toe -XR reviewed with patient -Dressing applied consisting of sterile gauze, tape -Discussed padding of the area between the toes to prevent pressure between the digits  Return in about 2 weeks (around 09/04/2019) for Wound Care, Right.  MDM

## 2019-08-22 ENCOUNTER — Other Ambulatory Visit: Payer: Self-pay | Admitting: Family Medicine

## 2019-08-22 ENCOUNTER — Telehealth: Payer: Medicare Other | Admitting: Family Medicine

## 2019-08-22 DIAGNOSIS — G5793 Unspecified mononeuropathy of bilateral lower limbs: Secondary | ICD-10-CM

## 2019-08-22 MED FILL — ALBUTEROL SULFATE HFA 108 (: 108 (90 BAS | 25 days supply | Qty: 18 | Fill #3

## 2019-08-22 MED FILL — ?HUMALOG 100 UNITS/ML KWIKP: 100 | 25 days supply | Qty: 15 | Fill #4

## 2019-08-22 MED FILL — SSD 1% CREAM: 1 | 7 days supply | Qty: 25 | Fill #0

## 2019-08-22 MED FILL — $LANTUS SOLOSTAR 100 UNITS/: 100 | 17 days supply | Qty: 6 | Fill #2

## 2019-08-23 ENCOUNTER — Other Ambulatory Visit: Payer: Self-pay

## 2019-08-23 ENCOUNTER — Inpatient Hospital Stay (HOSPITAL_COMMUNITY)
Admission: EM | Admit: 2019-08-23 | Discharge: 2019-08-27 | DRG: 639 | Disposition: A | Discharge status: Home / Self Care | Payer: Medicare Other | Attending: Internal Medicine | Admitting: Internal Medicine

## 2019-08-23 ENCOUNTER — Emergency Department (HOSPITAL_COMMUNITY): Payer: Medicare Other

## 2019-08-23 ENCOUNTER — Encounter (HOSPITAL_COMMUNITY): Payer: Self-pay | Admitting: Emergency Medicine

## 2019-08-23 DIAGNOSIS — E11628 Type 2 diabetes mellitus with other skin complications: Secondary | ICD-10-CM | POA: Diagnosis present

## 2019-08-23 DIAGNOSIS — Z20822 Contact with and (suspected) exposure to covid-19: Secondary | ICD-10-CM | POA: Diagnosis present

## 2019-08-23 DIAGNOSIS — L03031 Cellulitis of right toe: Secondary | ICD-10-CM | POA: Diagnosis present

## 2019-08-23 DIAGNOSIS — E11621 Type 2 diabetes mellitus with foot ulcer: Secondary | ICD-10-CM

## 2019-08-23 DIAGNOSIS — Z79899 Other long term (current) drug therapy: Secondary | ICD-10-CM | POA: Diagnosis not present

## 2019-08-23 DIAGNOSIS — I739 Peripheral vascular disease, unspecified: Secondary | ICD-10-CM

## 2019-08-23 DIAGNOSIS — E1140 Type 2 diabetes mellitus with diabetic neuropathy, unspecified: Secondary | ICD-10-CM

## 2019-08-23 DIAGNOSIS — I2510 Atherosclerotic heart disease of native coronary artery without angina pectoris: Secondary | ICD-10-CM | POA: Diagnosis present

## 2019-08-23 DIAGNOSIS — E08621 Diabetes mellitus due to underlying condition with foot ulcer: Secondary | ICD-10-CM

## 2019-08-23 DIAGNOSIS — I10 Essential (primary) hypertension: Secondary | ICD-10-CM | POA: Diagnosis present

## 2019-08-23 DIAGNOSIS — Z955 Presence of coronary angioplasty implant and graft: Secondary | ICD-10-CM

## 2019-08-23 DIAGNOSIS — Z794 Long term (current) use of insulin: Secondary | ICD-10-CM | POA: Diagnosis not present

## 2019-08-23 DIAGNOSIS — E1165 Type 2 diabetes mellitus with hyperglycemia: Secondary | ICD-10-CM | POA: Diagnosis present

## 2019-08-23 DIAGNOSIS — E1142 Type 2 diabetes mellitus with diabetic polyneuropathy: Secondary | ICD-10-CM | POA: Diagnosis present

## 2019-08-23 DIAGNOSIS — L0390 Cellulitis, unspecified: Secondary | ICD-10-CM | POA: Diagnosis not present

## 2019-08-23 DIAGNOSIS — L02611 Cutaneous abscess of right foot: Secondary | ICD-10-CM | POA: Diagnosis not present

## 2019-08-23 DIAGNOSIS — L89891 Pressure ulcer of other site, stage 1: Secondary | ICD-10-CM | POA: Diagnosis present

## 2019-08-23 DIAGNOSIS — R0989 Other specified symptoms and signs involving the circulatory and respiratory systems: Secondary | ICD-10-CM | POA: Diagnosis not present

## 2019-08-23 DIAGNOSIS — L97511 Non-pressure chronic ulcer of other part of right foot limited to breakdown of skin: Secondary | ICD-10-CM | POA: Diagnosis present

## 2019-08-23 DIAGNOSIS — L97519 Non-pressure chronic ulcer of other part of right foot with unspecified severity: Secondary | ICD-10-CM

## 2019-08-23 DIAGNOSIS — Z9861 Coronary angioplasty status: Secondary | ICD-10-CM

## 2019-08-23 DIAGNOSIS — L97509 Non-pressure chronic ulcer of other part of unspecified foot with unspecified severity: Secondary | ICD-10-CM

## 2019-08-23 HISTORY — DX: Type 2 diabetes mellitus with foot ulcer: E11.621

## 2019-08-23 HISTORY — DX: Type 2 diabetes mellitus with foot ulcer: L97.509

## 2019-08-23 LAB — CBC WITH DIFFERENTIAL/PLATELET
Abs Immature Granulocytes: 0.04 10*3/uL (ref 0.00–0.07)
Basophils Absolute: 0.1 10*3/uL (ref 0.0–0.1)
Basophils Relative: 1 %
Eosinophils Absolute: 0.3 10*3/uL (ref 0.0–0.5)
Eosinophils Relative: 3 %
HCT: 47.1 % (ref 39.0–52.0)
Hemoglobin: 14.4 g/dL (ref 13.0–17.0)
Immature Granulocytes: 1 %
Lymphocytes Relative: 38 %
Lymphs Abs: 3.4 10*3/uL (ref 0.7–4.0)
MCH: 27 pg (ref 26.0–34.0)
MCHC: 30.6 g/dL (ref 30.0–36.0)
MCV: 88.2 fL (ref 80.0–100.0)
Monocytes Absolute: 0.7 10*3/uL (ref 0.1–1.0)
Monocytes Relative: 8 %
Neutro Abs: 4.4 10*3/uL (ref 1.7–7.7)
Neutrophils Relative %: 49 %
Platelets: 310 10*3/uL (ref 150–400)
RBC: 5.34 MIL/uL (ref 4.22–5.81)
RDW: 12 % (ref 11.5–15.5)
WBC: 8.8 10*3/uL (ref 4.0–10.5)
nRBC: 0 % (ref 0.0–0.2)

## 2019-08-23 LAB — GLUCOSE, CAPILLARY
Glucose-Capillary: 138 mg/dL — ABNORMAL HIGH (ref 70–99)
Glucose-Capillary: 127 mg/dL — ABNORMAL HIGH (ref 70–99)

## 2019-08-23 LAB — LACTIC ACID, PLASMA
Lactic Acid, Venous: 2.5 mmol/L (ref 0.5–1.9)
Lactic Acid, Venous: 2.4 mmol/L (ref 0.5–1.9)

## 2019-08-23 LAB — CBC
HCT: 42.9 % (ref 39.0–52.0)
Hemoglobin: 13.6 g/dL (ref 13.0–17.0)
MCH: 27.2 pg (ref 26.0–34.0)
MCHC: 31.7 g/dL (ref 30.0–36.0)
MCV: 85.8 fL (ref 80.0–100.0)
Platelets: 293 10*3/uL (ref 150–400)
RBC: 5 MIL/uL (ref 4.22–5.81)
RDW: 12 % (ref 11.5–15.5)
WBC: 8.9 10*3/uL (ref 4.0–10.5)
nRBC: 0 % (ref 0.0–0.2)

## 2019-08-23 LAB — SARS CORONAVIRUS 2 (TAT 6-24 HRS): SARS Coronavirus 2: NEGATIVE

## 2019-08-23 LAB — CREATININE, SERUM
Creatinine, Ser: 0.69 mg/dL (ref 0.61–1.24)
GFR calc Af Amer: >60 mL/min (ref >60)
GFR calc non Af Amer: >60 mL/min (ref >60)

## 2019-08-23 LAB — BASIC METABOLIC PANEL
Anion gap: 12 (ref 5–15)
BUN: 8 mg/dL (ref 6–20)
CO2: 22 mmol/L (ref 22–32)
Calcium: 9.3 mg/dL (ref 8.9–10.3)
Chloride: 103 mmol/L (ref 98–111)
Creatinine, Ser: 0.69 mg/dL (ref 0.61–1.24)
GFR calc Af Amer: >60 mL/min (ref >60)
GFR calc non Af Amer: >60 mL/min (ref >60)
Glucose, Bld: 165 mg/dL — ABNORMAL HIGH (ref 70–99)
Potassium: 3.3 mmol/L — ABNORMAL LOW (ref 3.5–5.1)
Sodium: 137 mmol/L (ref 135–145)

## 2019-08-23 LAB — HEMOGLOBIN A1C
Hgb A1c MFr Bld: 7.1 % — ABNORMAL HIGH (ref 4.8–5.6)
Mean Plasma Glucose: 157.07 mg/dL

## 2019-08-23 LAB — HIV ANTIBODY (ROUTINE TESTING W REFLEX): HIV Screen 4th Generation wRfx: NONREACTIVE

## 2019-08-23 MED ORDER — METOPROLOL SUCCINATE ER 50 MG PO TB24
75.0000 mg | ORAL_TABLET | Freq: Every day | ORAL | Status: DC
  Administered 2019-08-23 – 2019-08-26 (×4): 75 mg via ORAL
  Filled 2019-08-23 (×4): qty 1

## 2019-08-23 MED ORDER — ALBUTEROL SULFATE HFA 108 (90 BASE) MCG/ACT IN AERS: 2.0000 | INHALATION_SPRAY | Freq: Four times a day (QID) | RESPIRATORY_TRACT | Status: DC | PRN

## 2019-08-23 MED ORDER — GABAPENTIN 800 MG PO TABS
3600.0000 mg | ORAL_TABLET | Freq: Every day | ORAL | Status: DC
  Filled 2019-08-23: qty 2

## 2019-08-23 MED ORDER — VANCOMYCIN HCL IN DEXTROSE 1-5 GM/200ML-% IV SOLN
1000.0000 mg | Freq: Once | INTRAVENOUS | Status: AC
  Administered 2019-08-23: 1000 mg via INTRAVENOUS
  Filled 2019-08-23: qty 200

## 2019-08-23 MED ORDER — DICYCLOMINE HCL 20 MG PO TABS
20.0000 mg | ORAL_TABLET | Freq: Four times a day (QID) | ORAL | Status: DC | PRN
  Filled 2019-08-23: qty 1

## 2019-08-23 MED ORDER — PREGABALIN 25 MG PO CAPS
150.0000 mg | ORAL_CAPSULE | Freq: Two times a day (BID) | ORAL | Status: DC
  Administered 2019-08-23 – 2019-08-27 (×8): 150 mg via ORAL
  Filled 2019-08-23 (×9): qty 2

## 2019-08-23 MED ORDER — SODIUM CHLORIDE 0.9 % IV BOLUS
1000.0000 mL | Freq: Once | INTRAVENOUS | Status: AC
  Administered 2019-08-23: 1000 mL via INTRAVENOUS

## 2019-08-23 MED ORDER — NITROGLYCERIN 0.4 MG/HR TD PT24
0.4000 mg | MEDICATED_PATCH | Freq: Every day | TRANSDERMAL | Status: DC
  Administered 2019-08-24 – 2019-08-27 (×4): 0.4 mg via TRANSDERMAL
  Filled 2019-08-23 (×5): qty 1

## 2019-08-23 MED ORDER — ACETAMINOPHEN 650 MG RE SUPP: 650.0000 mg | Freq: Four times a day (QID) | RECTAL | Status: DC | PRN

## 2019-08-23 MED ORDER — POTASSIUM CHLORIDE CRYS ER 20 MEQ PO TBCR
40.0000 meq | EXTENDED_RELEASE_TABLET | Freq: Once | ORAL | Status: AC
  Administered 2019-08-23: 40 meq via ORAL
  Filled 2019-08-23 (×2): qty 2

## 2019-08-23 MED ORDER — MONTELUKAST SODIUM 10 MG PO TABS
10.0000 mg | ORAL_TABLET | Freq: Every day | ORAL | Status: DC
  Administered 2019-08-24 – 2019-08-27 (×4): 10 mg via ORAL
  Filled 2019-08-23 (×4): qty 1

## 2019-08-23 MED ORDER — PIPERACILLIN-TAZOBACTAM 3.375 G IVPB 30 MIN
3.3750 g | Freq: Once | INTRAVENOUS | Status: AC
  Administered 2019-08-23: 3.375 g via INTRAVENOUS
  Filled 2019-08-23: qty 50

## 2019-08-23 MED ORDER — PIPERACILLIN-TAZOBACTAM 3.375 G IVPB
3.3750 g | Freq: Three times a day (TID) | INTRAVENOUS | Status: DC
  Administered 2019-08-23 – 2019-08-27 (×12): 3.375 g via INTRAVENOUS
  Filled 2019-08-23 (×12): qty 50

## 2019-08-23 MED ORDER — VANCOMYCIN HCL 1500 MG/300ML IV SOLN
1500.0000 mg | Freq: Two times a day (BID) | INTRAVENOUS | Status: DC
  Administered 2019-08-24 – 2019-08-27 (×7): 1500 mg via INTRAVENOUS
  Filled 2019-08-23 (×9): qty 300

## 2019-08-23 MED ORDER — INSULIN ASPART 100 UNIT/ML ~~LOC~~ SOLN
18.0000 [IU] | Freq: Three times a day (TID) | SUBCUTANEOUS | Status: DC
  Administered 2019-08-23 – 2019-08-27 (×12): 18 [IU] via SUBCUTANEOUS

## 2019-08-23 MED ORDER — INSULIN GLARGINE 100 UNIT/ML ~~LOC~~ SOLN
40.0000 [IU] | Freq: Every day | SUBCUTANEOUS | Status: DC
  Administered 2019-08-23 – 2019-08-26 (×4): 40 [IU] via SUBCUTANEOUS
  Filled 2019-08-23 (×5): qty 0.4

## 2019-08-23 MED ORDER — AMLODIPINE BESYLATE 10 MG PO TABS
10.0000 mg | ORAL_TABLET | Freq: Every day | ORAL | Status: DC
  Administered 2019-08-24 – 2019-08-27 (×4): 10 mg via ORAL
  Filled 2019-08-23 (×4): qty 1

## 2019-08-23 MED ORDER — ONDANSETRON HCL 4 MG PO TABS: 4.0000 mg | ORAL_TABLET | Freq: Four times a day (QID) | ORAL | Status: DC | PRN

## 2019-08-23 MED ORDER — INSULIN LISPRO (1 UNIT DIAL) 100 UNIT/ML (KWIKPEN): 18.0000 [IU] | PEN_INJECTOR | Freq: Three times a day (TID) | SUBCUTANEOUS | Status: DC

## 2019-08-23 MED ORDER — RAMIPRIL 10 MG PO CAPS
10.0000 mg | ORAL_CAPSULE | Freq: Every day | ORAL | Status: DC
  Administered 2019-08-24 – 2019-08-27 (×4): 10 mg via ORAL
  Filled 2019-08-23 (×5): qty 1

## 2019-08-23 MED ORDER — ONDANSETRON HCL 4 MG/2ML IJ SOLN: 4.0000 mg | Freq: Four times a day (QID) | INTRAMUSCULAR | Status: DC | PRN

## 2019-08-23 MED ORDER — ASPIRIN EC 81 MG PO TBEC
81.0000 mg | DELAYED_RELEASE_TABLET | Freq: Every day | ORAL | Status: DC
  Administered 2019-08-24 – 2019-08-27 (×4): 81 mg via ORAL
  Filled 2019-08-23 (×4): qty 1

## 2019-08-23 MED ORDER — INSULIN ASPART 100 UNIT/ML ~~LOC~~ SOLN
0.0000 [IU] | Freq: Three times a day (TID) | SUBCUTANEOUS | Status: DC
  Administered 2019-08-23: 2 [IU] via SUBCUTANEOUS
  Administered 2019-08-24: 5 [IU] via SUBCUTANEOUS
  Administered 2019-08-25: 2 [IU] via SUBCUTANEOUS
  Administered 2019-08-25 – 2019-08-26 (×2): 3 [IU] via SUBCUTANEOUS
  Administered 2019-08-26 – 2019-08-27 (×2): 2 [IU] via SUBCUTANEOUS
  Administered 2019-08-27: 3 [IU] via SUBCUTANEOUS

## 2019-08-23 MED ORDER — ALBUTEROL SULFATE (2.5 MG/3ML) 0.083% IN NEBU: 2.5000 mg | INHALATION_SOLUTION | Freq: Four times a day (QID) | RESPIRATORY_TRACT | Status: DC | PRN

## 2019-08-23 MED ORDER — HYDROCODONE-ACETAMINOPHEN 5-325 MG PO TABS
1.0000 | ORAL_TABLET | Freq: Once | ORAL | Status: AC
  Administered 2019-08-23: 14:00:00 1 via ORAL
  Filled 2019-08-23: qty 1

## 2019-08-23 MED ORDER — ENOXAPARIN SODIUM 40 MG/0.4ML ~~LOC~~ SOLN
40.0000 mg | SUBCUTANEOUS | Status: DC
  Administered 2019-08-23 – 2019-08-26 (×4): 40 mg via SUBCUTANEOUS
  Filled 2019-08-23 (×4): qty 0.4

## 2019-08-23 MED ORDER — GABAPENTIN 400 MG PO CAPS: 3600.0000 mg | ORAL_CAPSULE | Freq: Every day | ORAL | Status: DC

## 2019-08-23 MED ORDER — NORTRIPTYLINE HCL 25 MG PO CAPS
50.0000 mg | ORAL_CAPSULE | Freq: Every day | ORAL | Status: DC
  Administered 2019-08-23 – 2019-08-26 (×4): 50 mg via ORAL
  Filled 2019-08-23 (×5): qty 2

## 2019-08-23 MED ORDER — INSULIN ASPART 100 UNIT/ML ~~LOC~~ SOLN: 0.0000 [IU] | Freq: Every day | SUBCUTANEOUS | Status: DC

## 2019-08-23 MED ORDER — ATORVASTATIN CALCIUM 80 MG PO TABS
80.0000 mg | ORAL_TABLET | Freq: Every day | ORAL | Status: DC
  Administered 2019-08-24 – 2019-08-27 (×4): 80 mg via ORAL
  Filled 2019-08-23 (×4): qty 1

## 2019-08-23 MED ORDER — ENOXAPARIN SODIUM 40 MG/0.4ML ~~LOC~~ SOLN: 40.0000 mg | SUBCUTANEOUS | Status: DC

## 2019-08-23 MED ORDER — PIPERACILLIN-TAZOBACTAM 3.375 G IVPB 30 MIN: 3.3750 g | Freq: Three times a day (TID) | INTRAVENOUS | Status: DC

## 2019-08-23 MED ORDER — ACETAMINOPHEN 325 MG PO TABS: 650.0000 mg | ORAL_TABLET | Freq: Four times a day (QID) | ORAL | Status: DC | PRN

## 2019-08-23 MED ORDER — SENNOSIDES-DOCUSATE SODIUM 8.6-50 MG PO TABS: 1.0000 | ORAL_TABLET | Freq: Every evening | ORAL | Status: DC | PRN

## 2019-08-23 MED ORDER — HYDROCODONE-ACETAMINOPHEN 5-325 MG PO TABS
1.0000 | ORAL_TABLET | ORAL | Status: DC | PRN
  Administered 2019-08-24 – 2019-08-27 (×11): 2 via ORAL
  Filled 2019-08-23 (×11): qty 2

## 2019-08-23 MED ORDER — GABAPENTIN 600 MG PO TABS
3600.0000 mg | ORAL_TABLET | Freq: Every day | ORAL | Status: DC
  Administered 2019-08-23 – 2019-08-26 (×4): 3600 mg via ORAL
  Filled 2019-08-23 (×5): qty 6

## 2019-08-23 MED ORDER — NITROGLYCERIN 0.4 MG SL SUBL: 0.4000 mg | SUBLINGUAL_TABLET | SUBLINGUAL | Status: DC | PRN

## 2019-08-23 MED ORDER — INSULIN GLARGINE 100 UNIT/ML SOLOSTAR PEN: 40.0000 [IU] | PEN_INJECTOR | Freq: Every day | SUBCUTANEOUS | Status: DC

## 2019-08-23 NOTE — ED Provider Notes (Signed)
[facility] EMERGENCY DEPARTMENT Provider Note   CSN: [id number] Arrival date & time: 08/23/19  1111     History Chief Complaint  Patient presents with  . Wound Check    Austin Vaughan is a 49 y.o. male with PMHx HTN, diabetes, neuropathy, who presents to the ED today for wound check. Pt reports that about 1 week ago he noticed a wound to his right second toe, unsure how he sustained this.  He states he went to urgent care earlier this week and was prescribed an antibiotic.  Per chart review patient was seen at urgent care on 1/03 for same and prescribed doxycycline.  He states he has been compliant with his medication.  Patient follow-up with podiatrist 2 days ago he had an x-ray which did not show any erosion into the bone.  States he was prescribed silver sulfadiazine to find to the area.  He states he was told to keep the area clean and to padded to refrain from any more skin erosion.  He reports he took Benadryl today and noticed that his toe was swollen, red, more painful than normal.  He states that the toe is beginning to look black to him which concerned him and prompted him to come to the ED today.  He states that his only been draining a small amount of clear yellow fluid.  Denies fevers or chills.  Sugars typically run between 102 100 and he is compliant with all his diabetic medication.         Past Medical History:  Diagnosis Date  . Coronary artery disease   . Diabetes mellitus without complication ([place])   . Hypertension     Patient Active Problem List   Diagnosis Date Noted  . Diabetic foot ulcer ([place]) 08/23/2019  . Neuropathy 12/17/2018  . Diabetes mellitus ([place]) 11/28/2018  . Type 2 diabetes mellitus with hyperglycemia, with long-term current use of insulin ([place]) 11/28/2018  . Type 2 diabetes mellitus with diabetic polyneuropathy, with long-term current use of insulin ([place]) 11/28/2018  . PSVT (paroxysmal supraventricular tachycardia) ([place])  10/22/2018  . Diabetic polyneuropathy associated with type 2 diabetes mellitus ([place]) 09/20/2018  . Ulnar neuropathy of both upper extremities 09/20/2018  . Palpitations 09/17/2018  . CAD S/P percutaneous coronary angioplasty 09/17/2018  . Dyslipidemia, goal LDL below 70 09/17/2018  . Insulin dependent diabetes mellitus with complications 34/91/7915  . Painful diabetic neuropathy ([place]) 07/26/2018  . Hypertension     Past Surgical History:  Procedure Laterality Date  . CORONARY ANGIOPLASTY WITH STENT PLACEMENT  07/2002       Family History  Problem Relation Age of Onset  . ALS Mother   . Lung cancer Father   . ALS Maternal Aunt   . Heart disease Paternal Grandmother   . Heart disease Paternal Grandfather     Social History   Tobacco Use  . Smoking status: Never Smoker  . Smokeless tobacco: Never Used  Substance Use Topics  . Alcohol use: Not Currently  . Drug use: Never    Home Medications Prior to Admission medications   Medication Sig Start Date End Date Taking? Authorizing Provider  albuterol (PROAIR HFA) 108 (90 Base) MCG/ACT inhaler Inhale 2 puffs into the lungs every 6 (six) hours as needed for wheezing or shortness of breath. 12/17/18  Yes Fulp, [name], MD  amLODipine (NORVASC) 10 MG tablet TAKE 1 TABLET (10 MG TOTAL) BY MOUTH DAILY. TO LOWER BLOOD PRESSURE Patient taking differently: Take 10 mg by  mouth every evening. To lower blood pressure 01/27/19  Yes Fulp, [name], MD  aspirin EC 81 MG tablet Take 81 mg by mouth daily.   Yes [provider]  atorvastatin (LIPITOR) 80 MG tablet Take 1 tablet (80 mg total) by mouth daily. 04/24/19  Yes Skeet Latch, MD  dapagliflozin propanediol (FARXIGA) 10 MG TABS tablet Take 10 mg by mouth daily. 01/28/19  Yes Shamleffer, Melanie Crazier, MD  dicyclomine (BENTYL) 20 MG tablet Take 1 tablet (20 mg total) by mouth 4 (four) times daily. As needed for abdominal cramping Patient taking differently: Take 20 mg by mouth 4  (four) times daily as needed (upsset stomach). As needed for abdominal cramping 08/22/18  Yes Fulp, [name], MD  doxycycline (VIBRAMYCIN) 100 MG capsule Take 1 capsule (100 mg total) by mouth 2 (two) times daily for 7 days. 08/17/19 08/24/19 Yes Hall-Potvin, [place], PA-C  gabapentin (NEURONTIN) 600 MG tablet TAKE 6 TABLETS (3,600 MG TOTAL) BY MOUTH AT BEDTIME. 08/05/19  Yes Fulp, [name], MD  Insulin Glargine (LANTUS SOLOSTAR) 100 UNIT/ML Solostar Pen Inject 34 Units into the skin daily. Patient taking differently: Inject 40 Units into the skin daily.  02/03/19  Yes Fulp, [name], MD  insulin lispro (HUMALOG KWIKPEN) 100 UNIT/ML KwikPen Inject 18 units into skin 3 times daily with ,eals. Max dosage 60 units daily. DX E11.65 Patient taking differently: Inject 18 Units into the skin 3 (three) times daily. Max dosage 60 units daily. 02/03/19  Yes Fulp, [name], MD  metFORMIN (GLUCOPHAGE) 1000 MG tablet Take 1 tablet (1,000 mg total) by mouth 2 (two) times daily with a meal. 01/28/19  Yes Shamleffer, Melanie Crazier, MD  metoprolol succinate (TOPROL-XL) 50 MG 24 hr tablet Take 1 and 1/2 tablets by mouth daily Patient taking differently: Take 75 mg by mouth daily. Take 1 and 1/2 tablets by mouth daily 04/24/19  Yes Skeet Latch, MD  montelukast (SINGULAIR) 10 MG tablet Take 10 mg by mouth daily.   Yes [provider]  nitroGLYCERIN (NITRODUR - DOSED IN MG/24 HR) 0.4 mg/hr patch Place 1 patch (0.4 mg total) onto the skin daily. 09/17/18  Yes Kilroy, Luke K, PA-C  nitroGLYCERIN (NITROSTAT) 0.4 MG SL tablet Place 0.4 mg under the tongue every 5 (five) minutes as needed for chest pain.   Yes [provider]  nortriptyline (PAMELOR) 50 MG capsule TAKE 1 CAPSULE (50 MG TOTAL) BY MOUTH AT BEDTIME. 08/13/19  Yes Fulp, [name], MD  pregabalin (LYRICA) 75 MG capsule Take 2 capsules (150 mg total) by mouth 2 (two) times daily. Patient taking differently: Take 225 mg by mouth every evening.  08/19/19  Yes  Kirsteins, Luanna Salk, MD  ramipril (ALTACE) 5 MG capsule TAKE 2 CAPSULES (10 MG TOTAL) BY MOUTH DAILY. Patient taking differently: Take 10 mg by mouth daily.  07/28/19  Yes Fulp, [name], MD  Blood Glucose Monitoring Suppl (TRUE METRIX METER) w/Device KIT Use daily to check blood sugar 4 times daily. 10/18/18   Shamleffer, Melanie Crazier, MD  glucose blood test strip 4x daily 10/18/18   Shamleffer, Melanie Crazier, MD  silver sulfADIAZINE (SILVADENE) 1 % cream Apply pea-sized amount to wound daily. 08/21/19   Evelina Bucy, DPM    Allergies    Ambien [zolpidem tartrate] and Shellfish allergy  Review of Systems   Review of Systems  Constitutional: Negative for chills and fever.  Skin: Positive for color change and wound.  All other systems reviewed and are negative.   Physical Exam Updated Vital Signs BP [name])  139/95   Pulse 90   Temp 98.3 F (36.8 C) (Oral)   Resp 16   SpO2 100%   Physical Exam Vitals and nursing note reviewed.  Constitutional:      Appearance: He is not ill-appearing or diaphoretic.  HENT:     Head: Normocephalic and atraumatic.  Eyes:     Conjunctiva/sclera: Conjunctivae normal.  Cardiovascular:     Rate and Rhythm: Normal rate and regular rhythm.     Pulses: Normal pulses.  Pulmonary:     Effort: Pulmonary effort is normal.     Breath sounds: Normal breath sounds. No wheezing, rhonchi or rales.  Abdominal:     Palpations: Abdomen is soft.     Tenderness: There is no abdominal tenderness.  Musculoskeletal:     Cervical back: Neck supple.     Comments: See photos below. Grade I ulcer noted to lateral aspect of right 2nd toe. Large blister noted to medial aspect of same toe. Toe is erythematous and edematous with TTP. Cap refill < 2 seconds on all digitis of right foot. 2+ DP pulse.   Skin:    General: Skin is warm and dry.  Neurological:     Mental Status: He is alert.          ED Results / Procedures / Treatments   Labs (all labs ordered are  listed, but only abnormal results are displayed) Labs Reviewed  BASIC METABOLIC PANEL - Abnormal; Notable for the following components:      Result Value   Potassium 3.3 (*)    Glucose, Bld 165 (*)    All other components within normal limits  LACTIC ACID, PLASMA - Abnormal; Notable for the following components:   Lactic Acid, Venous 2.5 (*)    All other components within normal limits  LACTIC ACID, PLASMA - Abnormal; Notable for the following components:   Lactic Acid, Venous 2.4 (*)    All other components within normal limits  AEROBIC CULTURE (SUPERFICIAL SPECIMEN)  CULTURE, BLOOD (ROUTINE X 2)  CULTURE, BLOOD (ROUTINE X 2)  SARS CORONAVIRUS 2 (TAT 6-24 HRS)  CBC WITH DIFFERENTIAL/PLATELET  CBC  CREATININE, SERUM  HIV ANTIBODY (ROUTINE TESTING W REFLEX)    EKG None  Radiology DG Foot Complete Right  Result Date: 08/21/2019 Please see detailed radiograph report in office note.  DG Toe 2nd Right  Result Date: 08/23/2019 CLINICAL DATA:  Diabetic ulcer. EXAM: RIGHT SECOND TOE COMPARISON:  None. FINDINGS: No evidence of osteomyelitis.  No soft tissue gas noted. IMPRESSION: Negative. Electronically Signed   By: Dorise Bullion III M.D   On: 08/23/2019 12:53    Procedures Procedures (including critical care time)  Medications Ordered in ED Medications  piperacillin-tazobactam (ZOSYN) IVPB 3.375 g (3.375 g Intravenous New Bag/Given 08/23/19 1513)  enoxaparin (LOVENOX) injection 40 mg (has no administration in time range)  enoxaparin (LOVENOX) injection 40 mg (has no administration in time range)  acetaminophen (TYLENOL) tablet 650 mg (has no administration in time range)    Or  acetaminophen (TYLENOL) suppository 650 mg (has no administration in time range)  HYDROcodone-acetaminophen (NORCO/VICODIN) 5-325 MG per tablet 1-2 tablet (has no administration in time range)  ondansetron (ZOFRAN) tablet 4 mg (has no administration in time range)    Or  ondansetron (ZOFRAN) injection  4 mg (has no administration in time range)  senna-docusate (Senokot-S) tablet 1 tablet (has no administration in time range)  potassium chloride SA (KLOR-CON) CR tablet 40 mEq (has no administration in time range)  piperacillin-tazobactam (ZOSYN) IVPB 3.375 g (has no administration in time range)  aspirin EC tablet 81 mg (has no administration in time range)  amLODipine (NORVASC) tablet 10 mg (has no administration in time range)  atorvastatin (LIPITOR) tablet 80 mg (has no administration in time range)  metoprolol succinate (TOPROL-XL) 24 hr tablet 75 mg (has no administration in time range)  nitroGLYCERIN (NITRODUR - Dosed in mg/24 hr) patch 0.4 mg (has no administration in time range)  nitroGLYCERIN (NITROSTAT) SL tablet 0.4 mg (has no administration in time range)  ramipril (ALTACE) capsule 10 mg (has no administration in time range)  nortriptyline (PAMELOR) capsule 50 mg (has no administration in time range)  Insulin Glargine (LANTUS) Solostar Pen 40 Units (has no administration in time range)  insulin lispro (HUMALOG) KwikPen 18 Units (has no administration in time range)  dicyclomine (BENTYL) tablet 20 mg (has no administration in time range)  gabapentin (NEURONTIN) tablet 3,600 mg (has no administration in time range)  pregabalin (LYRICA) capsule 150 mg (has no administration in time range)  albuterol (VENTOLIN HFA) 108 (90 Base) MCG/ACT inhaler 2 puff (has no administration in time range)  montelukast (SINGULAIR) tablet 10 mg (has no administration in time range)  vancomycin (VANCOCIN) IVPB 1000 mg/200 mL premix (has no administration in time range)  sodium chloride 0.9 % bolus 1,000 mL (0 mLs Intravenous Stopped 08/23/19 1511)  HYDROcodone-acetaminophen (NORCO/VICODIN) 5-325 MG per tablet 1 tablet (1 tablet Oral Given 08/23/19 1400)  vancomycin (VANCOCIN) IVPB 1000 mg/200 mL premix (0 mg Intravenous Stopped 08/23/19 1512)    ED Course  I have reviewed the triage vital signs and the  nursing notes.  Pertinent labs & imaging results that were available during my care of the patient were reviewed by me and considered in my medical decision making (see chart for details).  49 year old male who presents to the ED today with complaints of worsening ulcer to right 2nd toe x 1 week; currently being treated with doxycycline and silvadene cream; most recently seen by podiatry 2 days ago with negative xray for osteo. Reports increased swelling and pain that he noticed this morning. On arrival pt's vitals are stable. He is afebrile without tachycardia or tachypnea. He denies any recent fevers or chills. See photos above for wound appearance; stage I pressure ulcer with surrounding erythema and edema that was not present 2 days ago. Concern for failed outpatient abx. Will workup at this time and include screening labs, xrays, cultures. Pt has strong distal pulses and there is no concern for arterial occlusion.   CBC without leukocytosis although lactic acid elevated at 2.5; will give pt fluids. Xray negative for infection into the bone or subcutaneous gas.   Have empirically started pt on IV abx as there is concern for failed outpatient abx. Discussed case with attending physician Dr. Billy Fischer who suggests admission at this time. Will consult podiatry for further evaluation while pt is in the hospital. Will consult medicine for admission.   Clinical Course as of Aug 22 1518  Sat Aug 23, 2019  1222 WBC: 8.8 [MV]  1251 Lactic Acid, Venous(!!): 2.5 [MV]  1411 Discussed case with podiatrist Dr. [name] who agrees to see patient either this afternoon or tomorrow morning for further evaluation while he is in the hospital   [MV]  1450 Discussed case with Dr. Benny Lennert who agrees to accept patient for admission   [MV]    Clinical Course User Index [MV] Eustaquio Maize, PA-C   MDM Rules/Calculators/A&P  Final Clinical Impression(s) / ED Diagnoses Final diagnoses:    Diabetic ulcer of toe of right foot associated with diabetes mellitus due to underlying condition, limited to breakdown of skin [facility])    Rx / DC Orders ED Discharge Orders    None       Eustaquio Maize, PA-C 08/23/19 1520    Gareth Morgan, MD 08/27/19 1439

## 2019-08-23 NOTE — Progress Notes (Signed)
Pharmacy Antibiotic Note  Austin Vaughan is a 49 y.o. male admitted on 08/23/2019 with wound infection. Failed outpatient doxycyline. Starting on vancomycin. SCr 0.69, eCrCl > 80 ml/min. LA 2.4.   Vancomycin 1500 mg IV Q 12 hrs. Goal AUC 400-550. Expected AUC: 515 SCr used: 0.8    Plan: -Give another vancomycin 1 g to complete load of 2 g then 1500 mg IV q12h -Monitor renal fx, cultures, vancomycin levels as needed    Temp (24hrs), Avg:98.3 F (36.8 C), Min:98.3 F (36.8 C), Max:98.3 F (36.8 C)  Recent Labs  Lab 08/23/19 1206 08/23/19 1358  WBC 8.8  --   CREATININE 0.69  --   LATICACIDVEN 2.5* 2.4*       Austin Vaughan 08/23/2019 3:12 PM

## 2019-08-23 NOTE — ED Notes (Signed)
ED TO INPATIENT HANDOFF REPORT  ED Nurse Name and Phone #: Lior Hoen [phone]  S Name/Age/Gender Austin Vaughan 49 y.o. male Room/Bed: 045C/045C  Code Status   Code Status: Full Code  Home/SNF/Other Home Patient oriented to: self, place, time and situation Is this baseline? Yes   Triage Complete: Triage complete  Chief Complaint Diabetic foot ulcer (HCC) [P95.093, L97.509]  Triage Note Pt seen at [facility] on 1/3 for wound to R second toe.  States he is still taking antibiotic.  Seen by podiatrist on 1/7 and states he received cream and told that x-rays were normal.  C/o increased swelling and toe turning black.    Allergies Allergies  Allergen Reactions  . Ambien [Zolpidem Tartrate] Other (See Comments)    HALLUCINATIONS  . Shellfish Allergy Anaphylaxis    Seafood    Level of Care/Admitting Diagnosis ED Disposition    ED Disposition Condition Comment   Admit  Hospital Area: [facility] [[id number]]  Level of Care: Med-Surg [16]  Covid Evaluation: Asymptomatic Screening Protocol (No Symptoms)  Diagnosis: Diabetic foot ulcer (HCC) [267124]  Admitting Physician: Milinda Antis  Attending Physician: Gerri Lins, AVA [email or web address]  Estimated length of stay: 3 - 4 days  Certification:: I certify this patient will need inpatient services for at least 2 midnights       B Medical/Surgery History Past Medical History:  Diagnosis Date  . Coronary artery disease   . Diabetes mellitus without complication (HCC)   . Hypertension    Past Surgical History:  Procedure Laterality Date  . CORONARY ANGIOPLASTY WITH STENT PLACEMENT  07/2002     A IV Location/Drains/Wounds Patient Lines/Drains/Airways Status   Active Line/Drains/Airways    Name:   Placement date:   Placement time:   Site:   Days:   Peripheral IV 08/23/19 Right Antecubital   08/23/19    1356    Antecubital   less than 1          Intake/Output Last 24 hours  Intake/Output Summary (Last 24 hours) at  08/23/2019 1528 Last data filed at 08/23/2019 1512 Gross per 24 hour  Intake 1200 ml  Output -  Net 1200 ml    Labs/Imaging Results for orders placed or performed during the hospital encounter of 08/23/19 (from the past 48 hour(s))  CBC with Differential     Status: None   Collection Time: 08/23/19 12:06 PM  Result Value Ref Range   WBC 8.8 4.0 - 10.5 K/uL   RBC 5.34 4.22 - 5.81 MIL/uL   Hemoglobin 14.4 13.0 - 17.0 g/dL   HCT 58.0 99.8 - 33.8 %   MCV 88.2 80.0 - 100.0 fL   MCH 27.0 26.0 - 34.0 pg   MCHC 30.6 30.0 - 36.0 g/dL   RDW 25.0 53.9 - 76.7 %   Platelets 310 150 - 400 K/uL   nRBC 0.0 0.0 - 0.2 %   Neutrophils Relative % 49 %   Neutro Abs 4.4 1.7 - 7.7 K/uL   Lymphocytes Relative 38 %   Lymphs Abs 3.4 0.7 - 4.0 K/uL   Monocytes Relative 8 %   Monocytes Absolute 0.7 0.1 - 1.0 K/uL   Eosinophils Relative 3 %   Eosinophils Absolute 0.3 0.0 - 0.5 K/uL   Basophils Relative 1 %   Basophils Absolute 0.1 0.0 - 0.1 K/uL   Immature Granulocytes 1 %   Abs Immature Granulocytes 0.04 0.00 - 0.07 K/uL    Comment: Performed at [facility], [address] [place], [place] [address]  Basic metabolic panel     Status: Abnormal   Collection Time: 08/23/19 12:06 PM  Result Value Ref Range   Sodium 137 135 - 145 mmol/L   Potassium 3.3 (L) 3.5 - 5.1 mmol/L   Chloride 103 98 - 111 mmol/L   CO2 22 22 - 32 mmol/L   Glucose, Bld 165 (H) 70 - 99 mg/dL   BUN 8 6 - 20 mg/dL   Creatinine, Ser 8.52 0.61 - 1.24 mg/dL   Calcium 9.3 8.9 - 77.8 mg/dL   GFR calc non Af Amer >60 >60 mL/min   GFR calc Af Amer >60 >60 mL/min   Anion gap 12 5 - 15    Comment: Performed at [facility], [address], [place], [place] [address]  Lactic acid, plasma     Status: Abnormal   Collection Time: 08/23/19 12:06 PM  Result Value Ref Range   Lactic Acid, Venous 2.5 (HH) 0.5 - 1.9 mmol/L    Comment: CRITICAL RESULT CALLED TO, READ BACK BY AND VERIFIED WITH: C.Righteous Claiborne RN @ 1250 08/23/2019 BY  C.EDENS Performed at [facility], [address] [place], [place] [address]   Lactic acid, plasma     Status: Abnormal   Collection Time: 08/23/19  1:58 PM  Result Value Ref Range   Lactic Acid, Venous 2.4 (HH) 0.5 - 1.9 mmol/L    Comment: CRITICAL VALUE NOTED.  VALUE IS CONSISTENT WITH PREVIOUSLY REPORTED AND CALLED VALUE. Performed at [facility], [address], [place], [place] [address]    DG Foot Complete Right  Result Date: 08/21/2019 Please see detailed radiograph report in office note.  DG Toe 2nd Right  Result Date: 08/23/2019 CLINICAL DATA:  Diabetic ulcer. EXAM: RIGHT SECOND TOE COMPARISON:  None. FINDINGS: No evidence of osteomyelitis.  No soft tissue gas noted. IMPRESSION: Negative. Electronically Signed   By: Gerome Sam III M.D   On: 08/23/2019 12:53    Pending Labs Unresulted Labs (From admission, onward)    Start     Ordered   08/30/19 0500  Creatinine, serum  (enoxaparin (LOVENOX)    CrCl >/= 30 ml/min)  Weekly,   R    Comments: while on enoxaparin therapy    08/23/19 1444   08/30/19 0500  Creatinine, serum  (enoxaparin (LOVENOX)    CrCl >/= 30 ml/min)  Weekly,   R    Comments: while on enoxaparin therapy    08/23/19 1450   08/24/19 0500  CBC  Tomorrow morning,   R     08/23/19 1450   08/24/19 0500  Basic metabolic panel  Tomorrow morning,   R     08/23/19 1450   08/23/19 1517  HIV Antibody (routine testing w rflx)  Once,   R     08/23/19 1517   08/23/19 1444  CBC  (enoxaparin (LOVENOX)    CrCl >/= 30 ml/min)  Once,   STAT    Comments: Baseline for enoxaparin therapy IF NOT ALREADY DRAWN.  Notify MD if PLT < 100 K.    08/23/19 1444   08/23/19 1444  Creatinine, serum  (enoxaparin (LOVENOX)    CrCl >/= 30 ml/min)  Once,   STAT    Comments: Baseline for enoxaparin therapy IF NOT ALREADY DRAWN.    08/23/19 1444   08/23/19 1341  SARS CORONAVIRUS 2 (TAT 6-24 HRS) Nasopharyngeal Nasopharyngeal Swab  (Tier 3 (TAT 6-24 hrs))  Once,   STAT  Question Answer Comment  Is this test for diagnosis or screening Screening   Symptomatic for COVID-19 as defined by CDC No   Hospitalized for COVID-19 No   Admitted to ICU for COVID-19 No   Previously tested for COVID-19 No   Resident in a congregate (group) care setting No   Employed in healthcare setting No      08/23/19 1340   08/23/19 1340  Culture, blood (routine x 2)  BLOOD CULTURE X 2,   STAT     08/23/19 1339   08/23/19 1310  Wound or Superficial Culture  Once,   STAT     08/23/19 1310          Vitals/Pain Today's Vitals   08/23/19 1200 08/23/19 1330 08/23/19 1512 08/23/19 1513  BP: 125/78 131/87  (!) 139/95  Pulse:  88  73  Resp:    18  Temp:      TempSrc:      SpO2:  96%  98%  PainSc:   3      Isolation Precautions No active isolations  Medications Medications  piperacillin-tazobactam (ZOSYN) IVPB 3.375 g (3.375 g Intravenous New Bag/Given 08/23/19 1513)  enoxaparin (LOVENOX) injection 40 mg (has no administration in time range)  enoxaparin (LOVENOX) injection 40 mg (has no administration in time range)  acetaminophen (TYLENOL) tablet 650 mg (has no administration in time range)    Or  acetaminophen (TYLENOL) suppository 650 mg (has no administration in time range)  HYDROcodone-acetaminophen (NORCO/VICODIN) 5-325 MG per tablet 1-2 tablet (has no administration in time range)  ondansetron (ZOFRAN) tablet 4 mg (has no administration in time range)    Or  ondansetron (ZOFRAN) injection 4 mg (has no administration in time range)  senna-docusate (Senokot-S) tablet 1 tablet (has no administration in time range)  potassium chloride SA (KLOR-CON) CR tablet 40 mEq (has no administration in time range)  piperacillin-tazobactam (ZOSYN) IVPB 3.375 g (has no administration in time range)  aspirin EC tablet 81 mg (has no administration in time range)  amLODipine (NORVASC) tablet 10 mg (has no administration in time range)  atorvastatin (LIPITOR) tablet 80 mg (has no  administration in time range)  metoprolol succinate (TOPROL-XL) 24 hr tablet 75 mg (has no administration in time range)  nitroGLYCERIN (NITRODUR - Dosed in mg/24 hr) patch 0.4 mg (has no administration in time range)  nitroGLYCERIN (NITROSTAT) SL tablet 0.4 mg (has no administration in time range)  ramipril (ALTACE) capsule 10 mg (has no administration in time range)  nortriptyline (PAMELOR) capsule 50 mg (has no administration in time range)  Insulin Glargine (LANTUS) Solostar Pen 40 Units (has no administration in time range)  insulin lispro (HUMALOG) KwikPen 18 Units (has no administration in time range)  dicyclomine (BENTYL) tablet 20 mg (has no administration in time range)  gabapentin (NEURONTIN) tablet 3,600 mg (has no administration in time range)  pregabalin (LYRICA) capsule 150 mg (has no administration in time range)  albuterol (VENTOLIN HFA) 108 (90 Base) MCG/ACT inhaler 2 puff (has no administration in time range)  montelukast (SINGULAIR) tablet 10 mg (has no administration in time range)  vancomycin (VANCOCIN) IVPB 1000 mg/200 mL premix (has no administration in time range)  sodium chloride 0.9 % bolus 1,000 mL (0 mLs Intravenous Stopped 08/23/19 1511)  HYDROcodone-acetaminophen (NORCO/VICODIN) 5-325 MG per tablet 1 tablet (1 tablet Oral Given 08/23/19 1400)  vancomycin (VANCOCIN) IVPB 1000 mg/200 mL premix (0 mg Intravenous Stopped 08/23/19 1512)    Mobility walks with device Low fall risk  Focused Assessments muscu   R Recommendations: See Admitting Provider Note  Report given to:   Additional Notes:

## 2019-08-23 NOTE — ED Triage Notes (Signed)
Pt seen at [facility] on 1/3 for wound to R second toe.  States he is still taking antibiotic.  Seen by podiatrist on 1/7 and states he received cream and told that x-rays were normal.  C/o increased swelling and toe turning black.

## 2019-08-23 NOTE — H&P (Signed)
Austin Vaughan is an 49 y.o. male.   Chief Complaint: Worsening wound to second digit right foot. Failed outpatient treatment with one week of doxycycline prescribed by podiatry. Increasing pain, HPI: The patient isa 49 yr old man who states that about 10 days ago he noted erythema to the right lateral aspect of his second digit on his right foot. He wen to urgent care and was started on antibiotics. He followed up with Dr. Samuella Cota at [facility] and then was started on Doxycycline. He completed 7 days of therapy. When he went to change the bandage on the toe today he found increasing swelling, and erythema of toe. He also notes having pain in that extremity that is not managed with his neurontin.   The patient carries a medical history significant for CAD s/p stent, DM II, Hypertension, peripheral neuropathy.  In the ED he had temp 98.3, RR 16, HR 90, and SaO2 100% on room air. X-ray of the right second toe demonstrated no evidence of osteomyelitis. There was no soft tissue gas noted. Chemistry demonstrated potassium of 3.3, Creatinine was 0.69. Lactic acid was 2.5. He was given a bolus of IV fluids. Repeat lactic acid was 2.4. WBC was 8.8 with hemoglobin of 14.4 and platelets of 310. Hemoglobin A1c is pending.   The patient denies fever, chills, shortness of breath, cough, chest pain, nausea, vomiting, dysuria, swelling, headache, or neurological deficits.   [facility] were consulted to admit the patient for further evaluation and treatment. Podiatry has been consulted from the ED. Dr. Loreta Ave will see the patient.   COVID-19 results are pending. Past Medical History:  Diagnosis Date  . Coronary artery disease   . Diabetes mellitus without complication (HCC)   . Hypertension     Past Surgical History:  Procedure Laterality Date  . CORONARY ANGIOPLASTY WITH STENT PLACEMENT  07/2002    Family History  Problem Relation Age of Onset  . ALS Mother   . Lung cancer Father   . ALS  Maternal Aunt   . Heart disease Paternal Grandmother   . Heart disease Paternal Grandfather    Social History:  reports that he has never smoked. He has never used smokeless tobacco. He reports previous alcohol use. He reports that he does not use drugs. (Not in a hospital admission)   Allergies:  Allergies  Allergen Reactions  . Ambien [Zolpidem Tartrate] Other (See Comments)    HALLUCINATIONS  . Shellfish Allergy Anaphylaxis    Seafood    Pertinent items noted in HPI and remainder of comprehensive ROS otherwise negative.   General appearance: alert, cooperative, appears stated age and no distress Head: Normocephalic, without obvious abnormality, atraumatic Eyes: conjunctivae/corneas clear. PERRL, EOM's intact. Fundi benign. Throat: lips, mucosa, and tongue normal; teeth and gums normal Neck: no adenopathy, no carotid bruit, no JVD, supple, symmetrical, trachea midline and thyroid not enlarged, symmetric, no tenderness/mass/nodules Resp: No increased work of breathing. No wheezes, rales, or rhonchi. No tactile fremitus. Chest wall: no tenderness Cardio: regular rate and rhythm, S1, S2 normal, no murmur, click, rub or gallop GI: soft, non-tender; bowel sounds normal; no masses,  no organomegaly Extremities: ulceration of right lateral aspect of second digit on the right foot with swelling and erythema to entire toe. No cyanosis, clubbing or edema left lower extremity. Pulses: 2+ and symmetric Skin: Skin color, texture, turgor normal. No rashes or lesions Lymph nodes: Cervical, supraclavicular, and axillary nodes normal. Neurologic: Alert and oriented X 3, normal strength  and tone. Normal symmetric reflexes. Normal coordination and gait Incision/Wound: Wound second to left foot.  Results for orders placed or performed during the hospital encounter of 08/23/19 (from the past 48 hour(s))  CBC with Differential     Status: None   Collection Time: 08/23/19 12:06 PM  Result Value Ref  Range   WBC 8.8 4.0 - 10.5 K/uL   RBC 5.34 4.22 - 5.81 MIL/uL   Hemoglobin 14.4 13.0 - 17.0 g/dL   HCT 47.1 39.0 - 52.0 %   MCV 88.2 80.0 - 100.0 fL   MCH 27.0 26.0 - 34.0 pg   MCHC 30.6 30.0 - 36.0 g/dL   RDW 12.0 11.5 - 15.5 %   Platelets 310 150 - 400 K/uL   nRBC 0.0 0.0 - 0.2 %   Neutrophils Relative % 49 %   Neutro Abs 4.4 1.7 - 7.7 K/uL   Lymphocytes Relative 38 %   Lymphs Abs 3.4 0.7 - 4.0 K/uL   Monocytes Relative 8 %   Monocytes Absolute 0.7 0.1 - 1.0 K/uL   Eosinophils Relative 3 %   Eosinophils Absolute 0.3 0.0 - 0.5 K/uL   Basophils Relative 1 %   Basophils Absolute 0.1 0.0 - 0.1 K/uL   Immature Granulocytes 1 %   Abs Immature Granulocytes 0.04 0.00 - 0.07 K/uL    Comment: Performed at [facility], [address], [place], [place] [address]  Basic metabolic panel     Status: Abnormal   Collection Time: 08/23/19 12:06 PM  Result Value Ref Range   Sodium 137 135 - 145 mmol/L   Potassium 3.3 (L) 3.5 - 5.1 mmol/L   Chloride 103 98 - 111 mmol/L   CO2 22 22 - 32 mmol/L   Glucose, Bld 165 (H) 70 - 99 mg/dL   BUN 8 6 - 20 mg/dL   Creatinine, Ser 0.69 0.61 - 1.24 mg/dL   Calcium 9.3 8.9 - 10.3 mg/dL   GFR calc non Af Amer >60 >60 mL/min   GFR calc Af Amer >60 >60 mL/min   Anion gap 12 5 - 15    Comment: Performed at [place] [address], [place], [place] [address]  Lactic acid, plasma     Status: Abnormal   Collection Time: 08/23/19 12:06 PM  Result Value Ref Range   Lactic Acid, Venous 2.5 (HH) 0.5 - 1.9 mmol/L    Comment: CRITICAL RESULT CALLED TO, READ BACK BY AND VERIFIED WITH: C.WILSON RN @ 1250 08/23/2019 BY C.EDENS Performed at [facility], [place] [address], [place], [place] [address]   Lactic acid, plasma     Status: Abnormal   Collection Time: 08/23/19  1:58 PM  Result Value Ref Range   Lactic Acid, Venous 2.4 (HH) 0.5 - 1.9 mmol/L    Comment: CRITICAL VALUE NOTED.  VALUE IS CONSISTENT WITH PREVIOUSLY REPORTED AND CALLED  VALUE. Performed at [facility], [place] [address] [place], [place] [address]    @[email or web address] @  Blood pressure (!) 139/95, pulse 73, temperature 98.3 F (36.8 C), temperature source Oral, resp. rate 18, SpO2 98 %.   Assessment/Plan Problem  Diabetic Foot Ulcer (Hcc)  Diabetes Mellitus (Hcc)  Type 2 Diabetes Mellitus With Hyperglycemia, With Long-Term Current Use of Insulin (Hcc)  Type 2 Diabetes Mellitus With Diabetic Polyneuropathy, With Long-Term Current Use of Insulin (Hcc)  Cad S/P Percutaneous Coronary Angioplasty  Painful Diabetic Neuropathy (Hcc)  Hypertension   Diabetic foot ulcer: The patient has  been started on IV Vancomycin and Zosyn. Wound care and podiatry have been consulted. X-ray of the extremity had no evidence of osteomyelitis. Blood cultures x 2 have been drawn. He will have hydrocodone available for pain control.  Lactic acidosis: IV fluids.  DM II: will continue Lantus 40 units daily with 18 units of lispro prandial and moderate sliding scale correction insulin. Metformin and canafloxigan will be held.   Peripheral neuropathy: Continue neurontin and lyrica.  Essential hypertension: Continue norvasc, ramipril, and metoprolol.  CAD: S/P stents: Continue ASA, Nitrodur, and metoprolol as at home. He will be monitored on telemetry.  I have seen and examined this patient myself. I have spent 75 minutes in his evaluation and care.  CODE STATUS: Full Code Family Communication: none available DVT Prophylaxis: Lovenox Disposition: Home  Ayaansh Smail 08/23/2019, 3:38 PM

## 2019-08-24 DIAGNOSIS — L03031 Cellulitis of right toe: Secondary | ICD-10-CM

## 2019-08-24 DIAGNOSIS — E11628 Type 2 diabetes mellitus with other skin complications: Secondary | ICD-10-CM

## 2019-08-24 LAB — CBC
HCT: 42 % (ref 39.0–52.0)
Hemoglobin: 13.1 g/dL (ref 13.0–17.0)
MCH: 27.1 pg (ref 26.0–34.0)
MCHC: 31.2 g/dL (ref 30.0–36.0)
MCV: 86.8 fL (ref 80.0–100.0)
Platelets: 297 10*3/uL (ref 150–400)
RBC: 4.84 MIL/uL (ref 4.22–5.81)
RDW: 12.1 % (ref 11.5–15.5)
WBC: 9.2 10*3/uL (ref 4.0–10.5)
nRBC: 0 % (ref 0.0–0.2)

## 2019-08-24 LAB — GLUCOSE, CAPILLARY
Glucose-Capillary: 81 mg/dL (ref 70–99)
Glucose-Capillary: 207 mg/dL — ABNORMAL HIGH (ref 70–99)
Glucose-Capillary: 119 mg/dL — ABNORMAL HIGH (ref 70–99)
Glucose-Capillary: 132 mg/dL — ABNORMAL HIGH (ref 70–99)

## 2019-08-24 LAB — BASIC METABOLIC PANEL
Anion gap: 8 (ref 5–15)
BUN: 9 mg/dL (ref 6–20)
CO2: 25 mmol/L (ref 22–32)
Calcium: 8.7 mg/dL — ABNORMAL LOW (ref 8.9–10.3)
Chloride: 106 mmol/L (ref 98–111)
Creatinine, Ser: 0.67 mg/dL (ref 0.61–1.24)
GFR calc Af Amer: >60 mL/min (ref >60)
GFR calc non Af Amer: >60 mL/min (ref >60)
Glucose, Bld: 94 mg/dL (ref 70–99)
Potassium: 3.8 mmol/L (ref 3.5–5.1)
Sodium: 139 mmol/L (ref 135–145)

## 2019-08-24 NOTE — Progress Notes (Signed)
PROGRESS NOTE  Austin Vaughan UMP:[id number] DOB: 10-10-1970 DOA: 08/23/2019 PCP: Cain Saupe, MD  Brief History    The patient isa 49 yr old man who states that about 10 days ago he noted erythema to the right lateral aspect of his second digit on his right foot. He wen to urgent care and was started on antibiotics. He followed up with Dr. Samuella Cota at [facility] and then was started on Doxycycline. He completed 7 days of therapy. When he went to change the bandage on the toe today he found increasing swelling, and erythema of toe. He also notes having pain in that extremity that is not managed with his neurontin.   The patient carries a medical history significant for CAD s/p stent, DM II, Hypertension, peripheral neuropathy.  In the ED he had temp 98.3, RR 16, HR 90, and SaO2 100% on room air. X-ray of the right second toe demonstrated no evidence of osteomyelitis. There was no soft tissue gas noted. Chemistry demonstrated potassium of 3.3, Creatinine was 0.69. Lactic acid was 2.5. He was given a bolus of IV fluids. Repeat lactic acid was 2.4. WBC was 8.8 with hemoglobin of 14.4 and platelets of 310. Hemoglobin A1c is pending.   The patient denies fever, chills, shortness of breath, cough, chest pain, nausea, vomiting, dysuria, swelling, headache, or neurological deficits.   [facility] were consulted to admit the patient for further evaluation and treatment. Podiatry has been consulted from the ED.   Dr. [name] from [facility] has evaluated the patient today and has bandaged the toe. Vascular studies are planned for the lower extremity tomorrow.  Consultants  . Dr. [name] ([facility])  Procedures  . None  Antibiotics   Anti-infectives (From admission, onward)   Start     Dose/Rate Route Frequency Ordered Stop   08/24/19 0600  vancomycin (VANCOREADY) IVPB 1500 mg/300 mL     1,500 mg 150 mL/hr over 120 Minutes Intravenous Every 12 hours 08/23/19  1711     08/23/19 2300  piperacillin-tazobactam (ZOSYN) IVPB 3.375 g     3.375 g 12.5 mL/hr over 240 Minutes Intravenous Every 8 hours 08/23/19 1656     08/23/19 1600  vancomycin (VANCOCIN) IVPB 1000 mg/200 mL premix     1,000 mg 200 mL/hr over 60 Minutes Intravenous  Once 08/23/19 1516 08/23/19 1844   08/23/19 1500  piperacillin-tazobactam (ZOSYN) IVPB 3.375 g  Status:  Discontinued     3.375 g 100 mL/hr over 30 Minutes Intravenous Every 8 hours 08/23/19 1450 08/23/19 1655   08/23/19 1345  piperacillin-tazobactam (ZOSYN) IVPB 3.375 g     3.375 g 100 mL/hr over 30 Minutes Intravenous  Once 08/23/19 1339 08/23/19 1543   08/23/19 1345  vancomycin (VANCOCIN) IVPB 1000 mg/200 mL premix     1,000 mg 200 mL/hr over 60 Minutes Intravenous  Once 08/23/19 1339 08/23/19 1512    .  Subjective  The patient is resting comfortably. No new complaints.  Objective   Vitals:  Vitals:   08/24/19 0751 08/24/19 1245  BP: 126/73 110/67  Pulse: 68 77  Resp:    Temp: 97.7 F (36.5 C) 97.6 F (36.4 C)  SpO2: 97% 97%   Exam:  Constitutional:  . The patient is awake, alert, and oriented x 3. No acute distress. Respiratory:  . No increased work of breathing. . No wheezes, rales, or rhonchi . No tactile fremitus Cardiovascular:  . Regular rate and rhythm . No murmurs,  ectopy, or gallups. . No lateral PMI. No thrills. Abdomen:  . Abdomen is soft, non-tender, non-distended . No hernias, masses, or organomegaly . Normoactive bowel sounds.  Musculoskeletal:  . No cyanosis or clubbing . Edema of right foot . Right foot is bandaged. Skin:  . No rashes, lesions, ulcers . palpation of skin: no induration or nodules . Second digit of right foot is swollen, erythematous, and ulcerated Neurologic:  . CN 2-12 intact . Sensation all 4 extremities intact Psychiatric:  . Mental status o Mood, affect appropriate o Orientation to person, place, time  . judgment and insight appear intact  I have  personally reviewed the following:   Today's Data  . BMP, CBC  Imaging  . X-ray of toe  Scheduled Meds: . amLODipine  10 mg Oral Daily  . aspirin EC  81 mg Oral Daily  . atorvastatin  80 mg Oral Daily  . enoxaparin (LOVENOX) injection  40 mg Subcutaneous Q24H  . gabapentin  3,600 mg Oral QHS  . insulin aspart  0-15 Units Subcutaneous TID WC  . insulin aspart  0-5 Units Subcutaneous QHS  . insulin aspart  18 Units Subcutaneous TID WC  . insulin glargine  40 Units Subcutaneous QHS  . metoprolol succinate  75 mg Oral Daily  . montelukast  10 mg Oral Daily  . nitroGLYCERIN  0.4 mg Transdermal Daily  . nortriptyline  50 mg Oral QHS  . pregabalin  150 mg Oral BID  . ramipril  10 mg Oral Daily   Continuous Infusions: . piperacillin-tazobactam (ZOSYN)  IV 3.375 g (08/24/19 1346)  . vancomycin 1,500 mg (08/24/19 0540)    Principal Problem:   Diabetic foot ulcer ([place]) Active Problems:   Hypertension   Painful diabetic neuropathy ([place])   CAD S/P percutaneous coronary angioplasty   Type 2 diabetes mellitus with diabetic polyneuropathy, with long-term current use of insulin (HCC)   LOS: 1 day   A & P  Diabetic foot ulcer: The patient has been started on IV Vancomycin and Zosyn. Wound care and podiatry have been consulted. X-ray of the extremity had no evidence of osteomyelitis. Blood cultures x 2 have been drawn. He has hydrocodone available for pain control. The patient has been evaluated by Dr. [name]. He has rebandaged the foot and will order vascular studies for tomorrow.   Lactic acidosis: IV fluids. Will recheck.  DM II: will continue Lantus 40 units daily with 18 units of lispro prandial and moderate sliding scale correction insulin. Metformin and canafloxigan will be held. FSBS have run between 81 and 207 in the last 24 hours.  Peripheral neuropathy: Continue neurontin and lyrica.  Essential hypertension: Continue norvasc, ramipril, and metoprolol.  CAD: S/P  stents: Continue ASA, Nitrodur, and metoprolol as at home. He will be monitored on telemetry.  I have seen and examined this patient myself. I have spent 35 minutes in his evaluation and care.  CODE STATUS: Full Code Family Communication: none available DVT Prophylaxis: Lovenox Disposition: Home  Keishla Oyer 08/23/2019, 3:38 PM  Carolyn Maniscalco, DO [facility] Direct contact: see [email or web address]  7PM-7AM contact night coverage as above 08/24/2019, 1:51 PM  LOS: 1 day

## 2019-08-24 NOTE — Consult Note (Signed)
Reason for Consult: Infection Referring Physician: Dr. Fran Lowes, DO Austin Vaughan is an 49 y.o. male.  HPI: 49 year old male was admitted to the hospital overnight for concerns of worsening infection to his right second toe.  He states that he recently developed a wound of the toe was seen at urgent care and was started on antibiotics.  He follow-up with Dr. Samuella Cota on January 7.  He was on doxycycline was recommended Silvadene cream.  Patient came to the hospital yesterday for concerns of worsening infection as he was having increased pain, swelling to the foot.  Overall he feels well and denies any fevers, chills, nausea, vomiting.  He does have a history of neuropathy.  Past Medical History:  Diagnosis Date   Coronary artery disease    Diabetes mellitus without complication (HCC)    Hypertension     Past Surgical History:  Procedure Laterality Date   CORONARY ANGIOPLASTY WITH STENT PLACEMENT  07/2002    Family History  Problem Relation Age of Onset   ALS Mother    Lung cancer Father    ALS Maternal Aunt    Heart disease Paternal Grandmother    Heart disease Paternal Grandfather     Social History:  reports that he has never smoked. He has never used smokeless tobacco. He reports previous alcohol use. He reports that he does not use drugs.  Allergies:  Allergies  Allergen Reactions   Ambien [Zolpidem Tartrate] Other (See Comments)    HALLUCINATIONS   Shellfish Allergy Anaphylaxis    Seafood    Medications: I have reviewed the patient's current medications.  Results for orders placed or performed during the hospital encounter of 08/23/19 (from the past 48 hour(s))  CBC with Differential     Status: None   Collection Time: 08/23/19 12:06 PM  Result Value Ref Range   WBC 8.8 4.0 - 10.5 K/uL   RBC 5.34 4.22 - 5.81 MIL/uL   Hemoglobin 14.4 13.0 - 17.0 g/dL   HCT 16.1 09.6 - 04.5 %   MCV 88.2 80.0 - 100.0 fL   MCH 27.0 26.0 - 34.0 pg   MCHC 30.6 30.0 - 36.0 g/dL   RDW  40.9 81.1 - 91.4 %   Platelets 310 150 - 400 K/uL   nRBC 0.0 0.0 - 0.2 %   Neutrophils Relative % 49 %   Neutro Abs 4.4 1.7 - 7.7 K/uL   Lymphocytes Relative 38 %   Lymphs Abs 3.4 0.7 - 4.0 K/uL   Monocytes Relative 8 %   Monocytes Absolute 0.7 0.1 - 1.0 K/uL   Eosinophils Relative 3 %   Eosinophils Absolute 0.3 0.0 - 0.5 K/uL   Basophils Relative 1 %   Basophils Absolute 0.1 0.0 - 0.1 K/uL   Immature Granulocytes 1 %   Abs Immature Granulocytes 0.04 0.00 - 0.07 K/uL    Comment: Performed at [facility], [address] [place], [place] [address]  Basic metabolic panel     Status: Abnormal   Collection Time: 08/23/19 12:06 PM  Result Value Ref Range   Sodium 137 135 - 145 mmol/L   Potassium 3.3 (L) 3.5 - 5.1 mmol/L   Chloride 103 98 - 111 mmol/L   CO2 22 22 - 32 mmol/L   Glucose, Bld 165 (H) 70 - 99 mg/dL   BUN 8 6 - 20 mg/dL   Creatinine, Ser 6.21 0.61 - 1.24 mg/dL   Calcium 9.3 8.9 - 30.8 mg/dL   GFR calc  non Af Amer >60 >60 mL/min   GFR calc Af Amer >60 >60 mL/min   Anion gap 12 5 - 15    Comment: Performed at [facility], [address], [place], [place] [address]  Lactic acid, plasma     Status: Abnormal   Collection Time: 08/23/19 12:06 PM  Result Value Ref Range   Lactic Acid, Venous 2.5 (HH) 0.5 - 1.9 mmol/L    Comment: CRITICAL RESULT CALLED TO, READ BACK BY AND VERIFIED WITH: C.WILSON RN @ 1250 08/23/2019 BY C.EDENS Performed at [facility], [address], [place], [place] [address]   Lactic acid, plasma     Status: Abnormal   Collection Time: 08/23/19  1:58 PM  Result Value Ref Range   Lactic Acid, Venous 2.4 (HH) 0.5 - 1.9 mmol/L    Comment: CRITICAL VALUE NOTED.  VALUE IS CONSISTENT WITH PREVIOUSLY REPORTED AND CALLED VALUE. Performed at [facility], [address], [place], [place] [address]   Wound or Superficial Culture     Status: None (Preliminary result)   Collection Time: 08/23/19  1:59 PM   Specimen: Wound  Result Value  Ref Range   Specimen Description WOUND RIGHT TOE 2ND    Special Requests NONE    Gram Stain      RARE WBC PRESENT, PREDOMINANTLY PMN NO ORGANISMS SEEN Performed at [facility], [address] [place], [place] [address]    Culture PENDING    Report Status PENDING   SARS CORONAVIRUS 2 (TAT 6-24 HRS) Nasopharyngeal Nasopharyngeal Swab     Status: None   Collection Time: 08/23/19  3:17 PM   Specimen: Nasopharyngeal Swab  Result Value Ref Range   SARS Coronavirus 2 NEGATIVE NEGATIVE    Comment: (NOTE) SARS-CoV-2 target nucleic acids are NOT DETECTED. The SARS-CoV-2 RNA is generally detectable in upper and lower respiratory specimens during the acute phase of infection. Negative results do not preclude SARS-CoV-2 infection, do not rule out co-infections with other pathogens, and should not be used as the sole basis for treatment or other patient management decisions. Negative results must be combined with clinical observations, patient history, and epidemiological information. The expected result is Negative. Fact Sheet for Patients: HairSlick.no Fact Sheet for Healthcare Providers: quierodirigir.com This test is not yet approved or cleared by the [place] FDA and  has been authorized for detection and/or diagnosis of SARS-CoV-2 by FDA under an Emergency Use Authorization (EUA). This EUA will remain  in effect (meaning this test can be used) for the duration of the COVID-19 declaration under Section 56 4(b)(1) of the Act, 21 U.S.C. section 360bbb-3(b)(1), unless the authorization is terminated or revoked sooner. Performed at [facility], [address], [place], [place] [address]   HIV Antibody (routine testing w rflx)     Status: None   Collection Time: 08/23/19  3:17 PM  Result Value Ref Range   HIV Screen 4th Generation wRfx NON REACTIVE NON REACTIVE    Comment: Performed at [facility], [address], [place], [place] [address]  Creatinine, serum     Status: None   Collection Time: 08/23/19  4:24 PM  Result Value Ref Range   Creatinine, Ser 0.69 0.61 - 1.24 mg/dL   GFR calc non Af Amer >60 >60 mL/min   GFR calc Af Amer >60 >60 mL/min    Comment: Performed at [facility], [address] [place], [place] [address]  CBC     Status: None   Collection Time: 08/23/19  5:15 PM  Result Value Ref Range   WBC 8.9 4.0 - 10.5 K/uL   RBC 5.00 4.22 - 5.81 MIL/uL   Hemoglobin 13.6 13.0 - 17.0 g/dL   HCT 15.4 00.8 - 67.6 %   MCV 85.8 80.0 - 100.0 fL   MCH 27.2 26.0 - 34.0 pg   MCHC 31.7 30.0 - 36.0 g/dL   RDW 19.5 09.3 - 26.7 %   Platelets 293 150 - 400 K/uL   nRBC 0.0 0.0 - 0.2 %    Comment: Performed at [facility], [address], [place], [place] [address]  Hemoglobin A1c     Status: Abnormal   Collection Time: 08/23/19  5:15 PM  Result Value Ref Range   Hgb A1c MFr Bld 7.1 (H) 4.8 - 5.6 %    Comment: (NOTE) Pre diabetes:          5.7%-6.4% Diabetes:              >6.4% Glycemic control for   <7.0% adults with diabetes    Mean Plasma Glucose 157.07 mg/dL    Comment: Performed at [facility], [address] [place], [place] [address]  Glucose, capillary     Status: Abnormal   Collection Time: 08/23/19  5:35 PM  Result Value Ref Range   Glucose-Capillary 138 (H) 70 - 99 mg/dL  Glucose, capillary     Status: Abnormal   Collection Time: 08/23/19  9:27 PM  Result Value Ref Range   Glucose-Capillary 127 (H) 70 - 99 mg/dL  CBC     Status: None   Collection Time: 08/24/19  3:11 AM  Result Value Ref Range   WBC 9.2 4.0 - 10.5 K/uL   RBC 4.84 4.22 - 5.81 MIL/uL   Hemoglobin 13.1 13.0 - 17.0 g/dL   HCT 38.2 50.5 - 39.7 %   MCV 86.8 80.0 - 100.0 fL   MCH 27.1 26.0 - 34.0 pg   MCHC 31.2 30.0 - 36.0 g/dL   RDW 67.3 41.9 - 37.9 %   Platelets 297 150 - 400 K/uL   nRBC 0.0 0.0 - 0.2 %    Comment: Performed at [facility], [address],  [place], [place] [address]  Basic metabolic panel     Status: Abnormal   Collection Time: 08/24/19  3:11 AM  Result Value Ref Range   Sodium 139 135 - 145 mmol/L   Potassium 3.8 3.5 - 5.1 mmol/L   Chloride 106 98 - 111 mmol/L   CO2 25 22 - 32 mmol/L   Glucose, Bld 94 70 - 99 mg/dL   BUN 9 6 - 20 mg/dL   Creatinine, Ser 7.35 0.61 - 1.24 mg/dL   Calcium 8.7 (L) 8.9 - 10.3 mg/dL   GFR calc non Af Amer >60 >60 mL/min   GFR calc Af Amer >60 >60 mL/min   Anion gap 8 5 - 15    Comment: Performed at [facility], [address], [place], [place] [address]  Glucose, capillary     Status: None   Collection Time: 08/24/19  6:53 AM  Result Value Ref Range   Glucose-Capillary 81 70 - 99 mg/dL    DG Toe 2nd Right  Result Date: 08/23/2019 CLINICAL DATA:  Diabetic ulcer. EXAM: RIGHT SECOND TOE COMPARISON:  None. FINDINGS: No evidence of osteomyelitis.  No soft tissue gas noted. IMPRESSION: Negative. Electronically Signed  By: Dorise Bullion III M.D   On: 08/23/2019 12:53    Review of Systems Blood pressure 126/73, pulse 68, temperature 97.7 F (36.5 C), temperature source Oral, resp. rate 16, height 6\' 2"  (1.88 m), weight 110.8 kg, SpO2 97 %. Physical Exam General: AAO x3, NAD  Dermatological: Superficial ulceration on the lateral aspect of the 2nd toe with a small amount of purulence expressed, although minimal. Blister on the medial aspect. Upon puncturing this only clear fluid expressed. No pus. Mild warmth to the foot. Swelling to the foot.   Vascular: Dorsalis Pedis artery and Posterior Tibial artery pedal pulses are 1/4 bilateral with immedate capillary fill time. There is no pain with calf compression, swelling, warmth, erythema.   Neruologic: Sensation decreased  Musculoskeletal: Subjectively having pain to the toes. Swelling present to the 2nd toe and foot.            Assessment/Plan: I cleaned the skin I did drain the blisters only clear blister fluid was expressed and  there is no purulence.  Left the blister skin intact.  Recommended small amount of Betadine to the areas and I applied a dressing to help separate the digits.  For now continue IV antibiotics. He is on vanc/zosyn.  Wound culture pending.  I do recommend arterial studies.  He can be weight-bear as tolerated in surgical shoe.  If no improvement tomorrow recommend MRI as x-ray negative for osteomydlitis. However based on the pictures it does seem that it is somewhat improved compared to yesterday after being on IV antibiotics. Will continue to follow.   Trula Slade 08/24/2019, 9:22 AM   O: [phone] C: [phone]

## 2019-08-24 NOTE — Plan of Care (Signed)

## 2019-08-25 ENCOUNTER — Inpatient Hospital Stay (HOSPITAL_COMMUNITY): Payer: Medicare Other

## 2019-08-25 ENCOUNTER — Ambulatory Visit: Payer: Medicare Other | Admitting: Podiatry

## 2019-08-25 DIAGNOSIS — L02611 Cutaneous abscess of right foot: Secondary | ICD-10-CM

## 2019-08-25 LAB — CBC WITH DIFFERENTIAL/PLATELET
Abs Immature Granulocytes: 0.02 10*3/uL (ref 0.00–0.07)
Basophils Absolute: 0.1 10*3/uL (ref 0.0–0.1)
Basophils Relative: 1 %
Eosinophils Absolute: 0.4 10*3/uL (ref 0.0–0.5)
Eosinophils Relative: 5 %
HCT: 41.1 % (ref 39.0–52.0)
Hemoglobin: 12.7 g/dL — ABNORMAL LOW (ref 13.0–17.0)
Immature Granulocytes: 0 %
Lymphocytes Relative: 43 %
Lymphs Abs: 3.1 10*3/uL (ref 0.7–4.0)
MCH: 26.8 pg (ref 26.0–34.0)
MCHC: 30.9 g/dL (ref 30.0–36.0)
MCV: 86.9 fL (ref 80.0–100.0)
Monocytes Absolute: 0.6 10*3/uL (ref 0.1–1.0)
Monocytes Relative: 9 %
Neutro Abs: 3.1 10*3/uL (ref 1.7–7.7)
Neutrophils Relative %: 42 %
Platelets: 285 10*3/uL (ref 150–400)
RBC: 4.73 MIL/uL (ref 4.22–5.81)
RDW: 12 % (ref 11.5–15.5)
WBC: 7.4 10*3/uL (ref 4.0–10.5)
nRBC: 0 % (ref 0.0–0.2)

## 2019-08-25 LAB — AEROBIC CULTURE W GRAM STAIN (SUPERFICIAL SPECIMEN): Culture: NORMAL

## 2019-08-25 LAB — BASIC METABOLIC PANEL
Anion gap: 10 (ref 5–15)
BUN: 11 mg/dL (ref 6–20)
CO2: 28 mmol/L (ref 22–32)
Calcium: 9 mg/dL (ref 8.9–10.3)
Chloride: 104 mmol/L (ref 98–111)
Creatinine, Ser: 0.73 mg/dL (ref 0.61–1.24)
GFR calc Af Amer: >60 mL/min (ref >60)
GFR calc non Af Amer: >60 mL/min (ref >60)
Glucose, Bld: 89 mg/dL (ref 70–99)
Potassium: 4.3 mmol/L (ref 3.5–5.1)
Sodium: 142 mmol/L (ref 135–145)

## 2019-08-25 LAB — GLUCOSE, CAPILLARY
Glucose-Capillary: 81 mg/dL (ref 70–99)
Glucose-Capillary: 127 mg/dL — ABNORMAL HIGH (ref 70–99)
Glucose-Capillary: 151 mg/dL — ABNORMAL HIGH (ref 70–99)
Glucose-Capillary: 142 mg/dL — ABNORMAL HIGH (ref 70–99)

## 2019-08-25 MED ORDER — GADOBUTROL 1 MMOL/ML IV SOLN
10.0000 mL | Freq: Once | INTRAVENOUS | Status: AC | PRN
  Administered 2019-08-25: 10 mL via INTRAVENOUS

## 2019-08-25 NOTE — Progress Notes (Signed)
PROGRESS NOTE  Austin Vaughan SHF:[id number] DOB: May 20, 1971 DOA: 08/23/2019 PCP: [name], MD  Brief History   The patient isa 49 yr old man who states that about 10 days ago he noted erythema to the right lateral aspect of his second digit on his right foot. He wen to urgent care and was started on antibiotics. He followed up with Dr. [name] at [facility] and then was started on Doxycycline. He completed 7 days of therapy. When he went to change the bandage on the toe today he found increasing swelling, and erythema of toe. He also notes having pain in that extremity that is not managed with his neurontin.   The patient carries a medical history significant for CAD s/p stent, DM II, Hypertension, peripheral neuropathy.  In the ED he had temp 98.3, RR 16, HR 90, and SaO2 100% on room air. X-ray of the right second toe demonstrated no evidence of osteomyelitis. There was no soft tissue gas noted. Chemistry demonstrated potassium of 3.3, Creatinine was 0.69. Lactic acid was 2.5. He was given a bolus of IV fluids. Repeat lactic acid was 2.4. WBC was 8.8 with hemoglobin of 14.4 and platelets of 310. Hemoglobin A1c is pending.   The patient denies fever, chills, shortness of breath, cough, chest pain, nausea, vomiting, dysuria, swelling, headache, or neurological deficits.   [facility] were consulted to admit the patient for further evaluation and treatment. Podiatry has been consulted from the ED.   Dr. [name] from [facility] has evaluated the patient today and has bandaged the toe. Vascular studies/MRI are planned for the lower extremity today.  Consultants  . Podiatry  Procedures  . I & D  Antibiotics   Anti-infectives (From admission, onward)   Start     Dose/Rate Route Frequency Ordered Stop   08/24/19 0600  vancomycin (VANCOREADY) IVPB 1500 mg/300 mL     1,500 mg 150 mL/hr over 120 Minutes Intravenous Every 12 hours 08/23/19 1711     08/23/19 2300   piperacillin-tazobactam (ZOSYN) IVPB 3.375 g     3.375 g 12.5 mL/hr over 240 Minutes Intravenous Every 8 hours 08/23/19 1656     08/23/19 1600  vancomycin (VANCOCIN) IVPB 1000 mg/200 mL premix     1,000 mg 200 mL/hr over 60 Minutes Intravenous  Once 08/23/19 1516 08/23/19 1844   08/23/19 1500  piperacillin-tazobactam (ZOSYN) IVPB 3.375 g  Status:  Discontinued     3.375 g 100 mL/hr over 30 Minutes Intravenous Every 8 hours 08/23/19 1450 08/23/19 1655   08/23/19 1345  piperacillin-tazobactam (ZOSYN) IVPB 3.375 g     3.375 g 100 mL/hr over 30 Minutes Intravenous  Once 08/23/19 1339 08/23/19 1543   08/23/19 1345  vancomycin (VANCOCIN) IVPB 1000 mg/200 mL premix     1,000 mg 200 mL/hr over 60 Minutes Intravenous  Once 08/23/19 1339 08/23/19 1512    .  Subjective  The patient is resting comfortably. No new complaints.  Objective   Vitals:  Vitals:   08/25/19 0802 08/25/19 1518  BP: 119/79 115/76  Pulse: 72 87  Resp: 18 18  Temp: 97.7 F (36.5 C) 98 F (36.7 C)  SpO2: 95% 95%   Exam:  Constitutional:   The patient is awake, alert, and oriented x 3. No acute distress. Respiratory:   No increased work of breathing.  No wheezes, rales, or rhonchi  No tactile fremitus Cardiovascular:   Regular rate and rhythm  No murmurs, ectopy, or gallups.  No lateral PMI. No thrills. Abdomen:   Abdomen is soft, non-tender, non-distended  No hernias, masses, or organomegaly  Normoactive bowel sounds.  Musculoskeletal:   No cyanosis or clubbing  Edema of right foot  Right foot is bandaged. Skin:   No rashes, lesions, ulcers  palpation of skin: no induration or nodules  Second digit of right foot is swollen, erythematous, and ulcerated Neurologic:   CN 2-12 intact  Sensation all 4 extremities intact Psychiatric:   Mental status ? Mood, affect appropriate ? Orientation to person, place, time   judgment and insight appear intact I have personally reviewed the  following:   Today's Data  . Vitals, CBC, BMP  Micro Data  . Blood Culture x 2: no growth  Imaging  . MRI pending . X-ray of Foot: no evidence of osteomyelitis  Scheduled Meds: . amLODipine  10 mg Oral Daily  . aspirin EC  81 mg Oral Daily  . atorvastatin  80 mg Oral Daily  . enoxaparin (LOVENOX) injection  40 mg Subcutaneous Q24H  . gabapentin  3,600 mg Oral QHS  . insulin aspart  0-15 Units Subcutaneous TID WC  . insulin aspart  0-5 Units Subcutaneous QHS  . insulin aspart  18 Units Subcutaneous TID WC  . insulin glargine  40 Units Subcutaneous QHS  . metoprolol succinate  75 mg Oral Daily  . montelukast  10 mg Oral Daily  . nitroGLYCERIN  0.4 mg Transdermal Daily  . nortriptyline  50 mg Oral QHS  . pregabalin  150 mg Oral BID  . ramipril  10 mg Oral Daily   Continuous Infusions: . piperacillin-tazobactam (ZOSYN)  IV 3.375 g (08/25/19 1453)  . vancomycin 1,500 mg (08/25/19 4193)    Principal Problem:   Diabetic foot ulcer (HCC) Active Problems:   Hypertension   Painful diabetic neuropathy (HCC)   CAD S/P percutaneous coronary angioplasty   Type 2 diabetes mellitus with diabetic polyneuropathy, with long-term current use of insulin (HCC)   LOS: 2 days   A & P   Diabetic foot ulcer: The patient has been started on IV Vancomycin and Zosyn. Wound care and podiatry have been consulted. X-ray of the extremity had no evidence of osteomyelitis. Blood cultures x 2 have been drawn. He has hydrocodone available for pain control. The patient has been evaluated by Dr. [name]. He has I & D'd the toe MRI and vascular studies as per podiatry.   Lactic acidosis: Resolved.  DM II: will continue Lantus 40 units daily with 18 units of lispro prandial and moderate sliding scale correction insulin. Metformin and canafloxigan will be held. FSBS have run between 81 and 207 in the last 24 hours.  Peripheral neuropathy: Continue neurontin and lyrica.  Essential hypertension:  Continue norvasc, ramipril, and metoprolol.  CAD: S/P stents: Continue ASA, Nitrodur, and metoprolol as at home. He will be monitored on telemetry.  I have seen and examined this patient myself. I have spent 32 minutes in his evaluation and care.  CODE STATUS: Full Code Family Communication: none available DVT Prophylaxis: Lovenox Disposition: Home  Reinhold Rickey 08/25/2019,3:38 PM  Talaysha Freeberg, DO [facility] Direct contact: see [email or web address]  7PM-7AM contact night coverage as above 08/25/2019, 3:32 PM  LOS: 2 days

## 2019-08-25 NOTE — Plan of Care (Signed)

## 2019-08-25 NOTE — Plan of Care (Signed)

## 2019-08-25 NOTE — Progress Notes (Signed)
RN spoke with vascular lab and they will not be able to do ABI's until tomorrow.

## 2019-08-25 NOTE — Progress Notes (Signed)
Orthopedic Tech Progress Note Patient Details:  Austin Vaughan 09-04-1970 [id number]  Ortho Devices Type of Ortho Device: Postop shoe/boot Ortho Device/Splint Interventions: Application   Post Interventions Patient Tolerated: Well Instructions Provided: Care of device   [name] 08/25/2019, 1:47 PM

## 2019-08-26 ENCOUNTER — Ambulatory Visit: Payer: Medicare Other | Admitting: Internal Medicine

## 2019-08-26 ENCOUNTER — Encounter: Payer: Self-pay | Admitting: Family Medicine

## 2019-08-26 ENCOUNTER — Ambulatory Visit: Payer: Medicare Other | Admitting: Cardiovascular Disease

## 2019-08-26 ENCOUNTER — Inpatient Hospital Stay (HOSPITAL_COMMUNITY): Payer: Medicare Other

## 2019-08-26 DIAGNOSIS — L97511 Non-pressure chronic ulcer of other part of right foot limited to breakdown of skin: Secondary | ICD-10-CM

## 2019-08-26 DIAGNOSIS — E08621 Diabetes mellitus due to underlying condition with foot ulcer: Secondary | ICD-10-CM

## 2019-08-26 DIAGNOSIS — L0390 Cellulitis, unspecified: Secondary | ICD-10-CM

## 2019-08-26 DIAGNOSIS — R0989 Other specified symptoms and signs involving the circulatory and respiratory systems: Secondary | ICD-10-CM

## 2019-08-26 DIAGNOSIS — I739 Peripheral vascular disease, unspecified: Secondary | ICD-10-CM

## 2019-08-26 LAB — GLUCOSE, CAPILLARY
Glucose-Capillary: 118 mg/dL — ABNORMAL HIGH (ref 70–99)
Glucose-Capillary: 151 mg/dL — ABNORMAL HIGH (ref 70–99)
Glucose-Capillary: 140 mg/dL — ABNORMAL HIGH (ref 70–99)
Glucose-Capillary: 175 mg/dL — ABNORMAL HIGH (ref 70–99)

## 2019-08-26 NOTE — Progress Notes (Signed)
Subjective: 49 year old male admitted to the hospital over the weekend due to worsening ulcer, infection on the right second toe.  He was on outpatient oral antibiotics as well as local wound care with Silvadene however the wound progressed and he was admitted to the hospital for concerns of osteomyelitis and worsening wound.  X-rays were negative.  He states he still has pain to the foot and is concerned that the toe is becoming darker in color. Denies any systemic complaints such as fevers, chills, nausea, vomiting.   Objective: AAO x3, NAD DP/PT pulses palpable 1/4 on the right, CRT less than 3 seconds There is decreased swelling of the right foot compared to yesterday.  Decreased warmth.  Also the swelling to the second toe is improved.  There is a superficial wound on the lateral aspect as well as some darkened discoloration to the medial aspect and this is over the area of the previous blister.  There is no fluctuation or crepitation.  There is no malodor. No pain with calf compression, swelling, warmth, erythema  Assessment: Right second toe ulceration with improving cellulitis  Plan: -There is still swelling warmth of the foot however he does appear to be improving on IV antibiotics.  He is concerned that the toes becoming darker and he still having pain to the foot.  Will order an MRI to rule out osteomyelitis and he is awaiting arterial studies that will be performed tomorrow.  He can be weightbearing as tolerated in a postop shoe which is ordered.  Keep the wound clean and dry for now.  We will continue to follow.  Dispo: As long as MRI is negative as well as arterial studies negative can likely be discharged home with 10 days of Augmentin in the next 1 to 2 days.  Ovid Curd, DPM C: [phone] O: [phone] I can also be reached via secure chat

## 2019-08-26 NOTE — Progress Notes (Signed)
PROGRESS NOTE  DRAGON THRUSH VHQ:[id number] DOB: 14-Apr-1971 DOA: 08/23/2019 PCP: [name], MD  Brief History   The patient isa 49 yr old man who states that about 10 days ago he noted erythema to the right lateral aspect of his second digit on his right foot. He wen to urgent care and was started on antibiotics. He followed up with Dr. [name] at [facility] and then was started on Doxycycline. He completed 7 days of therapy. When he went to change the bandage on the toe today he found increasing swelling, and erythema of toe. He also notes having pain in that extremity that is not managed with his neurontin.   The patient carries a medical history significant for CAD s/p stent, DM II, Hypertension, peripheral neuropathy.  In the ED he had temp 98.3, RR 16, HR 90, and SaO2 100% on room air. X-ray of the right second toe demonstrated no evidence of osteomyelitis. There was no soft tissue gas noted. Chemistry demonstrated potassium of 3.3, Creatinine was 0.69. Lactic acid was 2.5. He was given a bolus of IV fluids. Repeat lactic acid was 2.4. WBC was 8.8 with hemoglobin of 14.4 and platelets of 310. Hemoglobin A1c is pending.   The patient denies fever, chills, shortness of breath, cough, chest pain, nausea, vomiting, dysuria, swelling, headache, or neurological deficits.   [facility] were consulted to admit the patient for further evaluation and treatment. Podiatry has been consulted from the ED.   Dr. [name] from [facility] has evaluated the patient today and has bandaged the toe. Vascular studies/MRI are planned for the lower extremity today.  Consultants  . Podiatry  Procedures  . I & D  Antibiotics   Anti-infectives (From admission, onward)   Start     Dose/Rate Route Frequency Ordered Stop   08/24/19 0600  vancomycin (VANCOREADY) IVPB 1500 mg/300 mL     1,500 mg 150 mL/hr over 120 Minutes Intravenous Every 12 hours 08/23/19 1711     08/23/19 2300   piperacillin-tazobactam (ZOSYN) IVPB 3.375 g     3.375 g 12.5 mL/hr over 240 Minutes Intravenous Every 8 hours 08/23/19 1656     08/23/19 1600  vancomycin (VANCOCIN) IVPB 1000 mg/200 mL premix     1,000 mg 200 mL/hr over 60 Minutes Intravenous  Once 08/23/19 1516 08/23/19 1844   08/23/19 1500  piperacillin-tazobactam (ZOSYN) IVPB 3.375 g  Status:  Discontinued     3.375 g 100 mL/hr over 30 Minutes Intravenous Every 8 hours 08/23/19 1450 08/23/19 1655   08/23/19 1345  piperacillin-tazobactam (ZOSYN) IVPB 3.375 g     3.375 g 100 mL/hr over 30 Minutes Intravenous  Once 08/23/19 1339 08/23/19 1543   08/23/19 1345  vancomycin (VANCOCIN) IVPB 1000 mg/200 mL premix     1,000 mg 200 mL/hr over 60 Minutes Intravenous  Once 08/23/19 1339 08/23/19 1512     Subjective  The patient is resting comfortably. No new complaints.  Objective   Vitals:  Vitals:   08/26/19 0740 08/26/19 1453  BP: 118/79 134/80  Pulse: 72 92  Resp: 18 18  Temp: 97.7 F (36.5 C) 99.1 F (37.3 C)  SpO2: 93% 98%   Exam:  Constitutional:   The patient is awake, alert, and oriented x 3. No acute distress. Respiratory:   No increased work of breathing.  No wheezes, rales, or rhonchi  No tactile fremitus Cardiovascular:   Regular rate and rhythm  No murmurs, ectopy, or gallups.  No  lateral PMI. No thrills. Abdomen:   Abdomen is soft, non-tender, non-distended  No hernias, masses, or organomegaly  Normoactive bowel sounds.  Musculoskeletal:   No cyanosis or clubbing  Edema of right foot  Right foot is bandaged. Skin:   No rashes, lesions, ulcers  palpation of skin: no induration or nodules  Second digit of right foot is swollen, erythematous, and ulcerated Neurologic:   CN 2-12 intact  Sensation all 4 extremities intact Psychiatric:   Mental status ? Mood, affect appropriate ? Orientation to person, place, time   judgment and insight appear intact I have personally reviewed the  following:   Today's Data  . Vitals, CBC, BMP, ABI  Micro Data  . Blood Culture x 2: no growth  Imaging  . MRI pending . X-ray of Foot: no evidence of osteomyelitis  Scheduled Meds: . amLODipine  10 mg Oral Daily  . aspirin EC  81 mg Oral Daily  . atorvastatin  80 mg Oral Daily  . enoxaparin (LOVENOX) injection  40 mg Subcutaneous Q24H  . gabapentin  3,600 mg Oral QHS  . insulin aspart  0-15 Units Subcutaneous TID WC  . insulin aspart  0-5 Units Subcutaneous QHS  . insulin aspart  18 Units Subcutaneous TID WC  . insulin glargine  40 Units Subcutaneous QHS  . metoprolol succinate  75 mg Oral Daily  . montelukast  10 mg Oral Daily  . nitroGLYCERIN  0.4 mg Transdermal Daily  . nortriptyline  50 mg Oral QHS  . pregabalin  150 mg Oral BID  . ramipril  10 mg Oral Daily   Continuous Infusions: . piperacillin-tazobactam (ZOSYN)  IV 3.375 g (08/26/19 1458)  . vancomycin 1,500 mg (08/26/19 0529)    Principal Problem:   Diabetic foot ulcer (HCC) Active Problems:   Hypertension   Painful diabetic neuropathy (HCC)   CAD S/P percutaneous coronary angioplasty   Type 2 diabetes mellitus with diabetic polyneuropathy, with long-term current use of insulin (HCC)   LOS: 3 days   A & P   Diabetic foot ulcer: The patient has been started on IV Vancomycin and Zosyn. Wound care and podiatry have been consulted. X-ray of the extremity had no evidence of osteomyelitis. Blood cultures x 2 have been drawn. He has hydrocodone available for pain control. The patient has been evaluated by Dr. [name]. He has I & D'd the toe MRI and vascular studies as per podiatry. Podiatry states that they will provide further reccs for discharge.  Peripheral Artery Disease: ABI's today demonstrated only mild right extremity arterial disease. Left extremity arterial studies are within normal range.   Lactic acidosis: Resolved.  DM II: will continue Lantus 40 units daily with 18 units of lispro prandial and  moderate sliding scale correction insulin. Metformin and canafloxigan will be held. FSBS have run between118 and 142 in the last 24 hours.  Peripheral neuropathy: Continue neurontin and lyrica.  Essential hypertension: Continue norvasc, ramipril, and metoprolol.  CAD: S/P stents: Continue ASA, Nitrodur, and metoprolol as at home. He will be monitored on telemetry.  I have seen and examined this patient myself. I have spent 30 minutes in his evaluation and care.  CODE STATUS: Full Code Family Communication: none available DVT Prophylaxis: Lovenox Disposition: Home tomorrow.  Loribeth Katich 08/26/2019,5:00 PM  Andra Heslin, DO [facility] Direct contact: see [email or web address]  7PM-7AM contact night coverage as above 08/26/2019, 5:00 PM  LOS: 2 days

## 2019-08-26 NOTE — Progress Notes (Signed)
Vascular studies reviewed - no significant abnormality requiring vascular intervention at this time.  Patient ok for d/c from podiatry standpoint. Recommend d/c with PO abx, consider 10 days augmentin.   Apply xeroform to wound, followed by well padded gauze. Change every other day.  Please contact with questions or concerns.  Park Liter, DPM

## 2019-08-26 NOTE — Progress Notes (Signed)
Pharmacy Antibiotic Note  Austin Vaughan is a 49 y.o. male admitted on 08/23/2019 with toe wound infection, negative for osteomyelitis. Failed outpatient doxycyline.Pharmacy is consulted for vancomyicin dosing, pt is also on pip/tazo.   S/p 1/10 bedside I&D. Wound slowly improving. Lasat Scr 1/11, stable.  1/9  Vancomycin 1500 mg IV Q 12 hrs. Goal AUC 400-550. Expected AUC: 515 SCr used: 0.8   Plan: Continue vancomycin 1500 mg IV q12h Continue pip/tazo 3.375mg  Q8hr  Monitor renal fx, cultures, vancomycin levels as needed Per podiatry, likely switch to amox/clav soon    Temp (24hrs), Avg:98.6 F (37 C), Min:97.7 F (36.5 C), Max:99.1 F (37.3 C)  Recent Labs  Lab 08/23/19 1206 08/23/19 1358 08/23/19 1624 08/23/19 1715 08/24/19 0311 08/25/19 0257  WBC 8.8  --   --  8.9 9.2 7.4  CREATININE 0.69  --  0.69  --  0.67 0.73  LATICACIDVEN 2.5* 2.4*  --   --   --   --    Antimicrobials: Pip/tazo  1/9>> Vanc  1/9 >> Doxy 1/3 > 1/9  Microbiology:  1/9 wound cx >> normal skin flora  1/9 blood culture >> ngtd 1/9 covid > negative  Alphia Moh, PharmD, BCPS, [facility] Clinical Pharmacist  Please check AMION for all [facility] Pharmacy phone numbers After 10:00 PM, call Main Pharmacy [phone]

## 2019-08-26 NOTE — Progress Notes (Signed)
  Subjective:  Patient ID: Austin Vaughan, male    DOB: Apr 08, 1971,  MRN: [id number]  Seen bedside. MRI performed late last night. No interval issues. Unsure if the foot is doing better. Objective:   Vitals:   08/26/19 0302 08/26/19 0740  BP: 119/83 118/79  Pulse: 73 72  Resp:  18  Temp: 98.5 F (36.9 C) 97.7 F (36.5 C)  SpO2: 96% 93%   AAO x3, NAD DP/PT pulses palpable 1/4 on the right, CRT less than 3 seconds Wound right second toe lateral aspect, with overyling eschar. No drainage. No active erythema. Blister at medial aspect of the digit.   Radiology:  MRI 1/11: Area of superficial ulceration seen overlying the dorsal aspect of the distal second digit. No loculated fluid collection or sinus tract is seen. There is diffuse dorsal subcutaneous edema seen.  IMPRESSION: Superficial ulceration over the dorsal aspect of the second digit. No abscess, sinus tract, or evidence of osteomyelitis.  Assessment & Plan:  Patient was evaluated and treated and all questions answered.  Right 2nd Toe Wound, cellulitis -MRI reviewed, no signs of OM -Cellulitis and edema resolving. -Dressed with xeroform, 4x4, kling, tape -Pending arterial studies. Will provide updated reccs at that time. -Dispo: pending arterial studies. Likely ok for d/c with local wound care and PO abx.  Park Liter, DPM  Accessible via secure chat for questions or concerns.

## 2019-08-26 NOTE — Telephone Encounter (Signed)
Patient requesting the freestyle libre.

## 2019-08-26 NOTE — Progress Notes (Signed)
ABI study completed.  Preliminary results can be found under CV proc under chart review.  08/26/2019 1:26 PM  Delbert Vu, K., RDMS, RVT

## 2019-08-27 ENCOUNTER — Other Ambulatory Visit: Payer: Self-pay | Admitting: Podiatry

## 2019-08-27 ENCOUNTER — Encounter: Payer: Self-pay | Admitting: Podiatry

## 2019-08-27 DIAGNOSIS — I739 Peripheral vascular disease, unspecified: Secondary | ICD-10-CM

## 2019-08-27 LAB — GLUCOSE, CAPILLARY
Glucose-Capillary: 155 mg/dL — ABNORMAL HIGH (ref 70–99)
Glucose-Capillary: 140 mg/dL — ABNORMAL HIGH (ref 70–99)
Glucose-Capillary: 174 mg/dL — ABNORMAL HIGH (ref 70–99)

## 2019-08-27 MED ORDER — HYDROCODONE-ACETAMINOPHEN 5-325 MG PO TABS: 1.0000 | ORAL_TABLET | Freq: Four times a day (QID) | ORAL | 0 refills | Status: DC | PRN

## 2019-08-27 MED ORDER — AMOXICILLIN-POT CLAVULANATE 875-125 MG PO TABS: 1.0000 | ORAL_TABLET | Freq: Two times a day (BID) | ORAL | 0 refills | Status: AC

## 2019-08-27 MED FILL — AMOX-CLAV 875-125 MG TABLET: 875-125 | 5 days supply | Qty: 10 | Fill #0

## 2019-08-27 MED FILL — ?NITROGLYCERIN 0.4MG/HR PTC: 0.4 | 30 days supply | Qty: 30 | Fill #4

## 2019-08-27 NOTE — Discharge Summary (Signed)
Physician Discharge Summary  Austin Vaughan HYI:[id number] DOB: 04-28-1971 DOA: 08/23/2019  PCP: [name], MD  Admit date: 08/23/2019 Discharge date: 08/27/2019  Admitted From: Home  Discharge disposition: Home   Recommendations for Outpatient Follow-Up:   Follow up with your primary care provider in one week, check CBC BMP at that time.    Follow-up with Dr. [name], Podiatry in 1 to 2 weeks after completion of antibiotics.   Discharge Diagnosis:   Principal Problem:   Diabetic foot ulcer ([place]) Active Problems:   Hypertension   Painful diabetic neuropathy ([place])   CAD S/P percutaneous coronary angioplasty   Type 2 diabetes mellitus with diabetic polyneuropathy, with long-term current use of insulin (HCC)   PAD (peripheral artery disease) ([place])    Discharge Condition: Improved.  Diet recommendation:   Carbohydrate-modified.    Wound care:  As instructed.  Code status: Full.   History of Present Illness:   The patient is a 49 yr old male with past medical history of coronary artery disease status post stent, diabetes mellitus type 2, peripheral neuropathy, hypertension stated  that about 10 days ago he noted erythema to the right lateral aspect of his second digit on his right foot. He went to urgent care and was started on antibiotics. He followed up with Dr. [name] at [facility] and then was started on Doxycycline. He completed 7 days of therapy. When he went to change the bandage, he found increasing swelling, and erythema of toe. He also notes having pain in that extremity that is not managed with his neurontin.   In the ED he had temp 98.3, RR 16, HR 90, and SaO2 100% on room air. X-ray of the right second toe demonstrated no evidence of osteomyelitis. There was no soft tissue gas noted. Chemistry demonstrated potassium of 3.3, Creatinine was 0.69. Lactic acid was 2.5. He was given a bolus of IV fluids. Repeat lactic acid was 2.4. WBC was 8.8 with hemoglobin of  14.4 and platelets of 310.   Hospital Course:  Following conditions were addressed during hospitalization as listed below,  Right second toe diabetic ulceration with cellulitis : Patient received IV Vancomycin and Zosyn with improvement to cellulitis.  Podiatry was consulted and recommended dressing daily and outpatient treatment with Augmentin for 10 days on discharge. X-ray of the extremity and MRI done on 1/11 had no evidence of osteomyelitis. Blood cultures x 2 were negative until the time of discharge.  Peripheral Artery Disease: ABI's demonstrated only mild right extremity arterial disease. Left extremity arterial studies are within normal range.   Lactic acidosis: Resolved.  DM II: Resume insulin regimen, metformin and canafloxigan on discharge.  Peripheral neuropathy: Continue neurontin and lyrica.  Essential hypertension: Continue norvasc, ramipril, and metoprolol on discharge.  CAD: S/P stents: Continue ASA, Nitrodur, and metoprolol as at home.   Disposition.  At this time, patient is stable for disposition home.  Patient will follow up with Dr. [name] as outpatient.  Medical Consultants:    Dr. [name] podiatry  Procedures:    None Subjective:   Today, patient feels okay.  Denies any fever, chills or rigor.  Discharge Exam:   Vitals:   08/27/19 0829 08/27/19 1409  BP: 97/77 118/72  Pulse: 72 80  Resp: 16 19  Temp: 97.8 F (36.6 C) 97.7 F (36.5 C)  SpO2: 96% 93%   Vitals:   08/26/19 1938 08/27/19 0250 08/27/19 0829 08/27/19 1409  BP: 125/80 116/81 97/77 118/72  Pulse: 94  71 72 80  Resp: 14  16 19   Temp: 98.2 F (36.8 C)  97.8 F (36.6 C) 97.7 F (36.5 C)  TempSrc: Oral  Oral Oral  SpO2: 96% 96% 96% 93%  Weight:      Height:       General: Alert awake, not in obvious distress HENT: pupils equally reacting to light and accommodation.  No scleral pallor or icterus noted. Oral mucosa is moist.  Chest:  Clear breath sounds.  Diminished breath  sounds bilaterally. No crackles or wheezes.  CVS: S1 &S2 heard. No murmur.  Regular rate and rhythm. Abdomen: Soft, nontender, nondistended.  Bowel sounds are heard.   Extremities: Right second toe with improving cellulitis and diabetic ulcer. Psych: Alert, awake and oriented, normal mood CNS:  No cranial nerve deficits.  Power equal in all extremities.   Skin: Warm and dry.  Right second toe ulcer with cellulitis.  The results of significant diagnostics from this hospitalization (including imaging, microbiology, ancillary and laboratory) are listed below for reference.     Diagnostic Studies:   DG Toe 2nd Right  Result Date: 08/23/2019 CLINICAL DATA:  Diabetic ulcer. EXAM: RIGHT SECOND TOE COMPARISON:  None. FINDINGS: No evidence of osteomyelitis.  No soft tissue gas noted. IMPRESSION: Negative. Electronically Signed   By: Dorise Bullion III M.D   On: 08/23/2019 12:53    Labs:   Basic Metabolic Panel: Recent Labs  Lab 08/23/19 1206 08/23/19 1624 08/24/19 0311 08/25/19 0257  NA 137  --  139 142  K 3.3*  --  3.8 4.3  CL 103  --  106 104  CO2 22  --  25 28  GLUCOSE 165*  --  94 89  BUN 8  --  9 11  CREATININE 0.69 0.69 0.67 0.73  CALCIUM 9.3  --  8.7* 9.0   GFR Estimated Creatinine Clearance: 146.3 mL/min (by C-G formula based on SCr of 0.73 mg/dL). Liver Function Tests: No results for input(s): AST, ALT, ALKPHOS, BILITOT, PROT, ALBUMIN in the last 168 hours. No results for input(s): LIPASE, AMYLASE in the last 168 hours. No results for input(s): AMMONIA in the last 168 hours. Coagulation profile No results for input(s): INR, PROTIME in the last 168 hours.  CBC: Recent Labs  Lab 08/23/19 1206 08/23/19 1715 08/24/19 0311 08/25/19 0257  WBC 8.8 8.9 9.2 7.4  NEUTROABS 4.4  --   --  3.1  HGB 14.4 13.6 13.1 12.7*  HCT 47.1 42.9 42.0 41.1  MCV 88.2 85.8 86.8 86.9  PLT 310 293 297 285   Cardiac Enzymes: No results for input(s): CKTOTAL, CKMB, CKMBINDEX, TROPONINI in  the last 168 hours. BNP: Invalid input(s): POCBNP CBG: Recent Labs  Lab 08/26/19 1134 08/26/19 1619 08/26/19 2058 08/27/19 0647 08/27/19 1130  GLUCAP 151* 140* 175* 155* 140*   D-Dimer No results for input(s): DDIMER in the last 72 hours. Hgb A1c No results for input(s): HGBA1C in the last 72 hours. Lipid Profile No results for input(s): CHOL, HDL, LDLCALC, TRIG, CHOLHDL, LDLDIRECT in the last 72 hours. Thyroid function studies No results for input(s): TSH, T4TOTAL, T3FREE, THYROIDAB in the last 72 hours.  Invalid input(s): FREET3 Anemia work up No results for input(s): VITAMINB12, FOLATE, FERRITIN, TIBC, IRON, RETICCTPCT in the last 72 hours. Microbiology Recent Results (from the past 240 hour(s))  Wound or Superficial Culture     Status: None   Collection Time: 08/23/19  1:59 PM   Specimen: Wound  Result Value Ref Range Status  Specimen Description WOUND RIGHT TOE 2ND  Final   Special Requests NONE  Final   Gram Stain   Final    RARE WBC PRESENT, PREDOMINANTLY PMN NO ORGANISMS SEEN    Culture   Final    RARE NORMAL SKIN FLORA NO GROUP A STREP (S.PYOGENES) ISOLATED NO STAPHYLOCOCCUS AUREUS ISOLATED Performed at [facility], [place] [address], [place], [place] [address]    Report Status 08/25/2019 FINAL  Final  Culture, blood (routine x 2)     Status: None (Preliminary result)   Collection Time: 08/23/19  2:00 PM   Specimen: BLOOD  Result Value Ref Range Status   Specimen Description BLOOD LEFT ANTECUBITAL  Final   Special Requests   Final    BOTTLES DRAWN AEROBIC ONLY Blood Culture results may not be optimal due to an inadequate volume of blood received in culture bottles   Culture   Final    NO GROWTH 4 DAYS Performed at [facility], [place] [address] [place], [place] [address]    Report Status PENDING  Incomplete  Culture, blood (routine x 2)     Status: None (Preliminary result)   Collection Time: 08/23/19  2:00 PM   Specimen: BLOOD  Result  Value Ref Range Status   Specimen Description BLOOD RIGHT ANTECUBITAL  Final   Special Requests   Final    BOTTLES DRAWN AEROBIC AND ANAEROBIC Blood Culture adequate volume   Culture   Final    NO GROWTH 4 DAYS Performed at [facility], [place] [address], [place], [place] [address]    Report Status PENDING  Incomplete  SARS CORONAVIRUS 2 (TAT 6-24 HRS) Nasopharyngeal Nasopharyngeal Swab     Status: None   Collection Time: 08/23/19  3:17 PM   Specimen: Nasopharyngeal Swab  Result Value Ref Range Status   SARS Coronavirus 2 NEGATIVE NEGATIVE Final    Comment: (NOTE) SARS-CoV-2 target nucleic acids are NOT DETECTED. The SARS-CoV-2 RNA is generally detectable in upper and lower respiratory specimens during the acute phase of infection. Negative results do not preclude SARS-CoV-2 infection, do not rule out co-infections with other pathogens, and should not be used as the sole basis for treatment or other patient management decisions. Negative results must be combined with clinical observations, patient history, and epidemiological information. The expected result is Negative. Fact Sheet for Patients: SugarRoll.be Fact Sheet for Healthcare Providers: https://www.woods-mathews.com/ This test is not yet approved or cleared by the [place] FDA and  has been authorized for detection and/or diagnosis of SARS-CoV-2 by FDA under an Emergency Use Authorization (EUA). This EUA will remain  in effect (meaning this test can be used) for the duration of the COVID-19 declaration under Section 56 4(b)(1) of the Act, 21 U.S.C. section 360bbb-3(b)(1), unless the authorization is terminated or revoked sooner. Performed at [facility], [place] [address], [place], [place] [address]      Discharge Instructions:   Discharge Instructions    Call MD for:   Complete by: As directed    Worsening symptoms   Diet Carb Modified   Complete by: As  directed    Discharge instructions   Complete by: As directed    Continue oral antibiotics to complete the course. Apply xeroform to wound, followed by well padded gauze. Change every other day. Follow up with Dr [name] podiatry in 1-2 weeks.Follow up with your primary care provider in 1-2 weeks for regular check up and blood work   Increase  activity slowly   Complete by: As directed      Allergies as of 08/27/2019      Reactions   Ambien [zolpidem Tartrate] Other (See Comments)   HALLUCINATIONS   Shellfish Allergy Anaphylaxis   Seafood      Medication List    STOP taking these medications   doxycycline 100 MG capsule Commonly known as: VIBRAMYCIN     TAKE these medications   albuterol 108 (90 Base) MCG/ACT inhaler Commonly known as: ProAir HFA Inhale 2 puffs into the lungs every 6 (six) hours as needed for wheezing or shortness of breath.   amLODipine 10 MG tablet Commonly known as: NORVASC TAKE 1 TABLET (10 MG TOTAL) BY MOUTH DAILY. TO LOWER BLOOD PRESSURE What changed: when to take this   amoxicillin-clavulanate 875-125 MG tablet Commonly known as: Augmentin Take 1 tablet by mouth 2 (two) times daily for 5 days.   aspirin EC 81 MG tablet Take 81 mg by mouth daily.   atorvastatin 80 MG tablet Commonly known as: LIPITOR Take 1 tablet (80 mg total) by mouth daily.   dicyclomine 20 MG tablet Commonly known as: BENTYL Take 1 tablet (20 mg total) by mouth 4 (four) times daily. As needed for abdominal cramping What changed:   when to take this  reasons to take this   Farxiga 10 MG Tabs tablet Generic drug: dapagliflozin propanediol Take 10 mg by mouth daily.   gabapentin 600 MG tablet Commonly known as: NEURONTIN TAKE 6 TABLETS (3,600 MG TOTAL) BY MOUTH AT BEDTIME.   glucose blood test strip 4x daily   HYDROcodone-acetaminophen 5-325 MG tablet Commonly known as: NORCO/VICODIN Take 1 tablet by mouth every 6 (six) hours as needed for moderate pain.     insulin lispro 100 UNIT/ML KwikPen Commonly known as: HumaLOG KwikPen Inject 18 units into skin 3 times daily with ,eals. Max dosage 60 units daily. DX E11.65 What changed:   how much to take  how to take this  when to take this  additional instructions   Lantus SoloStar 100 UNIT/ML Solostar Pen Generic drug: Insulin Glargine Inject 34 Units into the skin daily. What changed: how much to take   metFORMIN 1000 MG tablet Commonly known as: GLUCOPHAGE Take 1 tablet (1,000 mg total) by mouth 2 (two) times daily with a meal.   metoprolol succinate 50 MG 24 hr tablet Commonly known as: TOPROL-XL Take 1 and 1/2 tablets by mouth daily What changed:   how much to take  how to take this  when to take this   montelukast 10 MG tablet Commonly known as: SINGULAIR Take 10 mg by mouth daily.   nitroGLYCERIN 0.4 MG SL tablet Commonly known as: NITROSTAT Place 0.4 mg under the tongue every 5 (five) minutes as needed for chest pain.   nitroGLYCERIN 0.4 mg/hr patch Commonly known as: NITRODUR - Dosed in mg/24 hr Place 1 patch (0.4 mg total) onto the skin daily.   nortriptyline 50 MG capsule Commonly known as: PAMELOR TAKE 1 CAPSULE (50 MG TOTAL) BY MOUTH AT BEDTIME.   pregabalin 75 MG capsule Commonly known as: LYRICA Take 2 capsules (150 mg total) by mouth 2 (two) times daily. What changed:   how much to take  when to take this   ramipril 5 MG capsule Commonly known as: ALTACE TAKE 2 CAPSULES (10 MG TOTAL) BY MOUTH DAILY. What changed: See the new instructions.   silver sulfADIAZINE 1 % cream Commonly known as: Silvadene Apply pea-sized amount to wound  daily.   True Metrix Meter w/Device Kit Use daily to check blood sugar 4 times daily.      Follow-up Information    Evelina Bucy, DPM. Schedule an appointment as soon as possible for a visit in 1 week(s).   Specialty: Podiatry Why: follow up of your foot infection Contact information: [place] [address] [phone]        [name], MD. Schedule an appointment as soon as possible for a visit in 1 week(s).   Specialty: Family Medicine Why: regular followup and blood work Contact information: [place] [address] [phone]        Skeet Latch, MD .   Specialty: Cardiology Contact information: [address] [place] [address] [phone]           Time coordinating discharge: 39 minutes  Signed:  Jameer Storie  [facility] 08/27/2019, 4:01 PM

## 2019-08-27 NOTE — Plan of Care (Signed)

## 2019-08-27 NOTE — Progress Notes (Signed)
  Subjective:  Patient ID: LAUREN AGUAYO, male    DOB: 1971/04/14,  MRN: [id number]  Seen bedside. Concerned the toe looked darker this AM. Objective:   Vitals:   08/27/19 0829 08/27/19 1409  BP: 97/77 118/72  Pulse: 72 80  Resp: 16 19  Temp: 97.8 F (36.6 C) 97.7 F (36.5 C)  SpO2: 96% 93%   AAO x3, NAD DP/PT pulses palpable 1/4 on the right, CRT less than 3 seconds Wound right second toe lateral aspect, with overyling eschar. No drainage. No active erythema. Blister at medial aspect of the digit.   Assessment & Plan:  Patient was evaluated and treated and all questions answered.  Right 2nd Toe Wound, cellulitis -Cellulitis and edema resolved. -Dressed with betadine, gauze, tape. Apply daily -Arterial studies normal -F/u in the office this Friday. -Rx for pain medication sent to patient's pharmacy.  Ok for D/c from podiatry standpoint. PO abx at discharge - consider Augmentin x10 days. Park Liter, DPM  Accessible via secure chat for questions or concerns.

## 2019-08-27 NOTE — Progress Notes (Signed)
Pt given discharge instructions and gone over with him. He verbalized understanding. Belongings gathered to be sent home. Waiting on ride for discharge.

## 2019-08-27 NOTE — Progress Notes (Signed)
Pt has a small nose bleed from the right nostril. Pt stated he's never had a nose bleed. Will continue to monitor.

## 2019-08-27 NOTE — Progress Notes (Signed)
Rx sent to pharmacy   

## 2019-08-28 ENCOUNTER — Telehealth: Payer: Self-pay

## 2019-08-28 ENCOUNTER — Other Ambulatory Visit: Payer: Self-pay | Admitting: *Deleted

## 2019-08-28 LAB — CULTURE, BLOOD (ROUTINE X 2)
Culture: NO GROWTH
Culture: NO GROWTH
Special Requests: ADEQUATE

## 2019-08-28 MED ORDER — HYDROCODONE-ACETAMINOPHEN 5-325 MG PO TABS: 1.0000 | ORAL_TABLET | Freq: Four times a day (QID) | ORAL | 0 refills | Status: DC | PRN

## 2019-08-28 MED ORDER — PREGABALIN 150 MG PO CAPS: 150.0000 mg | ORAL_CAPSULE | Freq: Two times a day (BID) | ORAL | 2 refills | Status: DC

## 2019-08-28 NOTE — Telephone Encounter (Signed)
[facility]

## 2019-08-28 NOTE — Addendum Note (Signed)
Addended by: Ventura Sellers on: 08/28/2019 02:28 PM   Modules accepted: Orders

## 2019-08-28 NOTE — Telephone Encounter (Signed)
[facility] pharmacist called stated that the Pt was Rx Narco, pharmacist states they do not dispense any type of narco at their pharmacy and order has been deleted, if we can send pain Rx to another pharmacy

## 2019-08-28 NOTE — Telephone Encounter (Signed)
Pts wife called to follow up on the prescription for pt, [facility] [address]

## 2019-08-28 NOTE — Telephone Encounter (Signed)
Transition Care Management Follow-up Telephone Call  Patient was asleep at the time of the call. This information was obtained from his wife, Austin Vaughan.  DPR on file to speak to her.   Date of discharge and from where: 08/27/2019, [facility]   How have you been since you were released from the hospital? She stated that he has been feeling tired.   Any questions or concerns? None reported at this time  Items Reviewed:  Did the pt receive and understand the discharge instructions provided? Yes, no questions at this time  Medications obtained and verified? His wife said that he has all of his medications including the new meds and is aware that he is to stop taking the doxycycline.  No questions at this time   Any new allergies since your discharge? None reported   Do you have support at home? Yes, his wife  Other (ie: DME, Home Health, etc) no home health ordered.  Has glucometer. Checks blood sugars morning and evening as well as mealtimes.  Per his wife, he has the wound care supplies and is able to do the  wound care as ordered ,to his  right foot.   Functional Questionnaire: (I = Independent and D = Dependent) ADL's: independent  Follow up appointments reviewed:    PCP Hospital f/u appt confirmed? Appointment with Dr Jillyn Hidden, 09/05/2019 @ 1410   Specialist Hospital f/u appt confirmed? Podiatry appt tomorrow - 08/29/2019. appointment with endocrinology - 09/03/2019. .  Are transportation arrangements needed? No   If their condition worsens, is the pt aware to call  their PCP or go to the ED? yes  Was the patient provided with contact information for the PCP's office or ED?  He has the phone number for the clinic  Was the pt encouraged to call back with questions or concerns? yes

## 2019-08-28 NOTE — Telephone Encounter (Signed)
Can you call and ask where the patient would like the medication sent?

## 2019-08-28 NOTE — Telephone Encounter (Signed)
[facility] [address]

## 2019-08-29 ENCOUNTER — Ambulatory Visit (INDEPENDENT_AMBULATORY_CARE_PROVIDER_SITE_OTHER): Payer: Medicare Other | Admitting: Podiatry

## 2019-08-29 ENCOUNTER — Other Ambulatory Visit: Payer: Self-pay

## 2019-08-29 ENCOUNTER — Telehealth: Payer: Self-pay | Admitting: *Deleted

## 2019-08-29 VITALS — Temp 97.3°F

## 2019-08-29 DIAGNOSIS — L089 Local infection of the skin and subcutaneous tissue, unspecified: Secondary | ICD-10-CM

## 2019-08-29 DIAGNOSIS — S90821A Blister (nonthermal), right foot, initial encounter: Secondary | ICD-10-CM | POA: Diagnosis not present

## 2019-08-29 DIAGNOSIS — L97511 Non-pressure chronic ulcer of other part of right foot limited to breakdown of skin: Secondary | ICD-10-CM | POA: Diagnosis not present

## 2019-08-29 DIAGNOSIS — R238 Other skin changes: Secondary | ICD-10-CM

## 2019-08-29 NOTE — Telephone Encounter (Signed)
An application to the [employer] for Lyrica was submitted today, 08/29/2019

## 2019-08-29 NOTE — Progress Notes (Signed)
  Subjective:  Patient ID: Austin Vaughan, male    DOB: Jun 22, 1971,  MRN: [id number]  Chief Complaint  Patient presents with  . Wound Check    Hospital f/u. R foot, 2nd toe. Pt stated, "I have 8/10 pain most of the time - toes, arch, and heel. I have neuropathy, so I also have numbness in both feet and ankles. AM glucose: "116" mg/dL.    49 y.o. male presents for wound care. Hx confirmed with patient.  Objective:  Physical Exam: Wound Location: right 2nd toe Wound Base: Granular/Healthy Peri-wound: redundant skin Exudate: Scant/small amount Serosanguinous exudate wound without warmth, erythema, signs of acute infection and appears improved compared to last recheck  Assessment:   1. Skin bulla   2. Ulcer of toe, right, limited to breakdown of skin (HCC)   3. Infected blister of right foot, initial encounter    Plan:  Patient was evaluated and treated and all questions answered.  Ulcer right 2nd toe -Dressing applied consisting of betadine, sterile gauze and kerlix -Wound cleansed and debrided. Redundant skin excised with 11 blade. -Surgical shoe dispensed.  Procedure: Drainage lesion Anesthesia: none Instrumentation: 11 blade Technique: Follwing sterile skin prep, the bulla was incised with 11 blade, fluid drained, deroofed Dressing: Dry, sterile, compression dressing. Disposition: Patient tolerated procedure well. Patient to return in 2 week for follow-up.    Return in about 2 weeks (around 09/12/2019) for Wound Care, Right 2nd toe.

## 2019-08-31 ENCOUNTER — Encounter: Payer: Self-pay | Admitting: Internal Medicine

## 2019-08-31 ENCOUNTER — Encounter: Payer: Self-pay | Admitting: Podiatry

## 2019-09-01 ENCOUNTER — Encounter (INDEPENDENT_AMBULATORY_CARE_PROVIDER_SITE_OTHER): Payer: Self-pay

## 2019-09-01 ENCOUNTER — Encounter: Payer: Self-pay | Admitting: Podiatry

## 2019-09-02 MED FILL — metFORMIN HCL 1000 MG TABS: 1000 | 30 days supply | Qty: 60 | Fill #7

## 2019-09-02 MED FILL — ATORVASTATIN 80 MG TABLET: 80 | 30 days supply | Qty: 30 | Fill #4

## 2019-09-02 MED FILL — RAMIPRIL 5 MG CAPS: 5 | 30 days supply | Qty: 60 | Fill #1

## 2019-09-02 MED FILL — $FARXIGA 10 MG TABLET: 10 | 30 days supply | Qty: 30 | Fill #5

## 2019-09-02 NOTE — Progress Notes (Addendum)
Virtual Visit via Video Note  I connected with Austin Vaughan 09/03/19 at  1:40 PM EST by a video enabled telemedicine application and verified that I am speaking with the correct person using two identifiers.   I discussed the limitations of evaluation and management by telemedicine and the availability of in person appointments. The patient expressed understanding and agreed to proceed.   -Location of the patient : Home  -Location of the provider : Office  -The names of all persons participating in the telemedicine service : Pt and myself  - Referring Provider : [name] , MD      PATIENT IDENTIFIER: Austin Vaughan is a 49 y.o. male with a past medical history of T2DM, HTN, CAD. The patient has followed with Endocrinology clinic since 10/17/2018 for consultative assistance with management of his diabetes.  DIABETIC HISTORY:  Austin Vaughan was diagnosed with T2DM since 2013. He has tried Januvia in the past but was ineffective in controlling his hyperglycemia. He used to be on Metformin but unclear the reason for discontinuation.He has been on insulin ~ 2018. His hemoglobin A1c has ranged from  9.5% in 2020, peaking at 10.0% in 2019.   On his initial visit to our clinic his A1c was 9.5% . He was on an MDi regimen and Glipizide. We stopped Glipizide and started Metformin. By 01/2019 [name] was started.   SUBJECTIVE:   During the last visit (04/29/2019): We increased metformin , continued Farxiga ,and continued  lantus and novolog    Today (09/03/2019): Austin Vaughan is here for a 6 week virtual follow up on his diabetes management.  He checks his blood sugars 4 times daily, preprandial and bedtime. The patient has not had hypoglycemic episodes since the last clinic visit. Otherwise, the patient has not required any recent emergency interventions for hypoglycemia but he has had recent hospitalization for right 2nd toe ulceration. He continues to have pain in his feet and hands that he attributes  to neuropathy, pt is on Lyrica and gabapentin. He gets his Lyrica through neurology and gabapentin through PCP.   He continues to have weakness in his hands which is getting worse.  Pt is able to make insulin adjustments based on MD recommendations    ROS: As per HPI and as detailed below: Review of Systems  Constitutional: Negative for fever.  HENT: Negative for congestion and sore throat.   Cardiovascular: Positive for leg swelling. Negative for chest pain and palpitations.  Gastrointestinal: Negative for diarrhea and nausea.      HOME DIABETES REGIMEN:   Lantus 40 units daily  HumaLog 18 units 3 times daily q. before meals  Metformin 1000 mg BID  Farxiga 10 mg daily       GLUCOSE LOG:  Date Breakfast  Lunch Supper Bedtime  09/02/2019 119 138 150 165  1/18 128 135 143 155      HISTORY:  Past Medical History:  Past Medical History:  Diagnosis Date  . Coronary artery disease   . Diabetes mellitus without complication ([place])   . Hypertension    Past Surgical History:  Past Surgical History:  Procedure Laterality Date  . CORONARY ANGIOPLASTY WITH STENT PLACEMENT  07/2002    Social History:  reports that he has never smoked. He has never used smokeless tobacco. He reports previous alcohol use. He reports that he does not use drugs. Family History:  Family History  Problem Relation Age of Onset  . ALS Mother   . Lung cancer  Father   . ALS Maternal Aunt   . Heart disease Paternal Grandmother   . Heart disease Paternal Grandfather      HOME MEDICATIONS: Allergies as of 09/03/2019      Reactions   Ambien [zolpidem Tartrate] Other (See Comments)   HALLUCINATIONS   Shellfish Allergy Anaphylaxis   Seafood      Medication List       Accurate as of September 03, 2019  8:16 AM. If you have any questions, ask your nurse or doctor.        albuterol 108 (90 Base) MCG/ACT inhaler Commonly known as: ProAir HFA Inhale 2 puffs into the lungs every 6 (six) hours  as needed for wheezing or shortness of breath.   amLODipine 10 MG tablet Commonly known as: NORVASC TAKE 1 TABLET (10 MG TOTAL) BY MOUTH DAILY. TO LOWER BLOOD PRESSURE What changed: when to take this   aspirin EC 81 MG tablet Take 81 mg by mouth daily.   atorvastatin 80 MG tablet Commonly known as: LIPITOR Take 1 tablet (80 mg total) by mouth daily.   dicyclomine 20 MG tablet Commonly known as: BENTYL Take 1 tablet (20 mg total) by mouth 4 (four) times daily. As needed for abdominal cramping What changed:   when to take this  reasons to take this   Farxiga 10 MG Tabs tablet Generic drug: dapagliflozin propanediol Take 10 mg by mouth daily.   gabapentin 600 MG tablet Commonly known as: NEURONTIN TAKE 6 TABLETS (3,600 MG TOTAL) BY MOUTH AT BEDTIME.   glucose blood test strip 4x daily   HYDROcodone-acetaminophen 5-325 MG tablet Commonly known as: NORCO/VICODIN Take 1 tablet by mouth every 6 (six) hours as needed for moderate pain.   insulin lispro 100 UNIT/ML KwikPen Commonly known as: HumaLOG KwikPen Inject 18 units into skin 3 times daily with ,eals. Max dosage 60 units daily. DX E11.65 What changed:   how much to take  how to take this  when to take this  additional instructions   Lantus SoloStar 100 UNIT/ML Solostar Pen Generic drug: Insulin Glargine Inject 34 Units into the skin daily. What changed: how much to take   metFORMIN 1000 MG tablet Commonly known as: GLUCOPHAGE Take 1 tablet (1,000 mg total) by mouth 2 (two) times daily with a meal.   metoprolol succinate 50 MG 24 hr tablet Commonly known as: TOPROL-XL Take 1 and 1/2 tablets by mouth daily What changed:   how much to take  how to take this  when to take this   montelukast 10 MG tablet Commonly known as: SINGULAIR Take 10 mg by mouth daily.   nitroGLYCERIN 0.4 MG SL tablet Commonly known as: NITROSTAT Place 0.4 mg under the tongue every 5 (five) minutes as needed for chest  pain.   nitroGLYCERIN 0.4 mg/hr patch Commonly known as: NITRODUR - Dosed in mg/24 hr Place 1 patch (0.4 mg total) onto the skin daily.   nortriptyline 50 MG capsule Commonly known as: PAMELOR TAKE 1 CAPSULE (50 MG TOTAL) BY MOUTH AT BEDTIME.   pregabalin 150 MG capsule Commonly known as: LYRICA Take 1 capsule (150 mg total) by mouth 2 (two) times daily.   ramipril 5 MG capsule Commonly known as: ALTACE TAKE 2 CAPSULES (10 MG TOTAL) BY MOUTH DAILY. What changed: See the new instructions.   silver sulfADIAZINE 1 % cream Commonly known as: Silvadene Apply pea-sized amount to wound daily.   True Metrix Meter w/Device Kit Use daily to check blood sugar  4 times daily.         DATA REVIEWED:  Lab Results  Component Value Date   HGBA1C 7.1 (H) 08/23/2019   HGBA1C 7.1 (H) 03/27/2019   HGBA1C 9.5 (A) 09/19/2018   Lab Results  Component Value Date   LDLCALC 61 04/29/2019   CREATININE 0.73 08/25/2019     Lab Results  Component Value Date   CHOL 121 04/29/2019   HDL 43 04/29/2019   LDLCALC 61 04/29/2019   TRIG 86 04/29/2019   CHOLHDL 2.8 04/29/2019         ASSESSMENT / PLAN / RECOMMENDATIONS:   1) Type 2 Diabetes Mellitus, Poorly controlled, With neuropathic and macrovascular complications - Most recent A1c of 7.1 %. Goal A1c < 7.0 %.    - I have praised him on improved glycemic control - No side effects to any medication at this time - Will continue current regimen  - [facility]  - Pt is able to make insulin adjustments based on MD recommendations    MEDICATIONS:  Metformin 1000 mg BID  Farxiga 10 mg daily   Lantus 40 units daily   Novolog 18 units TID QAC  EDUCATION / INSTRUCTIONS:  BG monitoring instructions: Patient is instructed to check his blood sugars 4 times a day, before meals and bedtime.  Call [facility] clinic if: BG persistently < 70 or > 300. . I reviewed the Rule of 15 for the treatment  of hypoglycemia in detail with the patient. Literature supplied.  2.) Peripheral Neuropathy:   - Despite multiple referral  Though his PCP's office , he has not been able to get to them in over a year.  - I did express my concern about him being on Lyrica and gabapentin at the time same time, he will check with his PCP  On next visit  - He has not been tried on Cymbalta , I will start him on a small dose, will be cautious if an increase dose is needed as it may increase concentration of Nortriptyline.   Medication:  Cymbalta 20 mg daily     I discussed the assessment and treatment plan with the patient. The patient was provided an opportunity to ask questions and all were answered. The patient agreed with the plan and demonstrated an understanding of the instructions.    3) Right Toe ulcer:   - He complete an antibiotic course. He has an appointment with Dr. [name] to determine other options . He is concerned about the possibility of an amputation  F/U in 4 months     Signed electronically by: Mack Guise, MD  [facility] Endocrinology  [address] [place], [place] [address] Phone: [phone] FAX: [phone]   CC: [name], MD [place] [address] Phone: [phone]  Fax: [phone]  Return to Endocrinology clinic as below: Future Appointments  Date Time Provider [place]  09/03/2019  9:45 AM Felipa Furnace, DPM [facility]  09/03/2019  1:40 PM Juwann Sherk, Melanie Crazier, MD [facility] None  09/05/2019  2:10 PM [name], MD [facility] None  09/12/2019  2:45 PM Evelina Bucy, DPM [facility]  09/17/2019  1:40 PM Danis, Kirke Corin, MD [facility]  09/25/2019  2:20 PM Skeet Latch, MD [facility]  10/31/2019  2:30 PM Narda Amber K, DO [facility] None  12/04/2019 10:00 AM Edgardo Roys, PsyD [facility]

## 2019-09-03 ENCOUNTER — Encounter: Payer: Self-pay | Admitting: Internal Medicine

## 2019-09-03 ENCOUNTER — Ambulatory Visit (INDEPENDENT_AMBULATORY_CARE_PROVIDER_SITE_OTHER): Payer: Medicare Other | Admitting: Podiatry

## 2019-09-03 ENCOUNTER — Ambulatory Visit (INDEPENDENT_AMBULATORY_CARE_PROVIDER_SITE_OTHER): Payer: Medicare Other | Admitting: Internal Medicine

## 2019-09-03 ENCOUNTER — Ambulatory Visit (INDEPENDENT_AMBULATORY_CARE_PROVIDER_SITE_OTHER): Payer: Medicare Other

## 2019-09-03 ENCOUNTER — Other Ambulatory Visit: Payer: Self-pay

## 2019-09-03 DIAGNOSIS — L97511 Non-pressure chronic ulcer of other part of right foot limited to breakdown of skin: Secondary | ICD-10-CM | POA: Diagnosis not present

## 2019-09-03 DIAGNOSIS — E1142 Type 2 diabetes mellitus with diabetic polyneuropathy: Secondary | ICD-10-CM

## 2019-09-03 DIAGNOSIS — Z794 Long term (current) use of insulin: Secondary | ICD-10-CM

## 2019-09-03 DIAGNOSIS — I96 Gangrene, not elsewhere classified: Secondary | ICD-10-CM

## 2019-09-03 MED ORDER — DULOXETINE HCL 20 MG PO CPEP: 20.0000 mg | ORAL_CAPSULE | Freq: Every day | ORAL | 6 refills | Status: DC

## 2019-09-03 MED ORDER — FREESTYLE LIBRE READER DEVI: 1.0000 | 0 refills | Status: DC

## 2019-09-03 MED ORDER — HYDROCODONE-ACETAMINOPHEN 5-325 MG PO TABS: 1.0000 | ORAL_TABLET | Freq: Four times a day (QID) | ORAL | 0 refills | Status: DC | PRN

## 2019-09-03 MED ORDER — FREESTYLE LIBRE 14 DAY READER DEVI: 1.0000 | 0 refills | Status: DC

## 2019-09-03 MED ORDER — FREESTYLE LIBRE 14 DAY SENSOR MISC: 1.0000 | 11 refills | Status: DC

## 2019-09-03 MED FILL — DULoxetine HCL 20 MG CPEP: 20 | 30 days supply | Qty: 30 | Fill #0

## 2019-09-04 ENCOUNTER — Encounter: Payer: Self-pay | Admitting: Podiatry

## 2019-09-04 ENCOUNTER — Ambulatory Visit: Payer: Medicare Other | Admitting: Podiatry

## 2019-09-04 NOTE — Progress Notes (Signed)
Subjective:  Patient ID: Austin Vaughan, male    DOB: Jan 04, 1971,  MRN: [id number]  Chief Complaint  Patient presents with  . Toe Pain    pt is here for the right second toe, pt states it has started to turn black around 5 days ago, when he left the hospital, pt also states that he is in severe pain,     49 y.o. male presents with the above complaint.  Patient presents with a right second digit getting darker with time.  Patient states that there was a pain associated with for last couple of days.  Patient states he started to have after he visited the hospital.  Patient is known to Dr. [name] who has been acutely treating him.  He just wanted to make sure that there was not any acute infection currently going on.  Patient has been keeping the bandages nice dry clean and intact.  He denies any other acute complaints.  Patient has a history of vascular work-up at the hospital.   Review of Systems: Negative except as noted in the HPI. Denies N/V/F/Ch.  Past Medical History:  Diagnosis Date  . Coronary artery disease   . Diabetes mellitus without complication ([place])   . Hypertension     Current Outpatient Medications:  .  albuterol (PROAIR HFA) 108 (90 Base) MCG/ACT inhaler, Inhale 2 puffs into the lungs every 6 (six) hours as needed for wheezing or shortness of breath., Disp: 1 Inhaler, Rfl: 11 .  amLODipine (NORVASC) 10 MG tablet, TAKE 1 TABLET (10 MG TOTAL) BY MOUTH DAILY. TO LOWER BLOOD PRESSURE (Patient taking differently: Take 10 mg by mouth every evening. To lower blood pressure), Disp: 90 tablet, Rfl: 0 .  aspirin EC 81 MG tablet, Take 81 mg by mouth daily., Disp: , Rfl:  .  atorvastatin (LIPITOR) 80 MG tablet, Take 1 tablet (80 mg total) by mouth daily., Disp: 90 tablet, Rfl: 3 .  Blood Glucose Monitoring Suppl (TRUE METRIX METER) w/Device KIT, Use daily to check blood sugar 4 times daily., Disp: 1 kit, Rfl: 0 .  dapagliflozin propanediol (FARXIGA) 10 MG TABS tablet, Take 10 mg by mouth  daily., Disp: 30 tablet, Rfl: 6 .  dicyclomine (BENTYL) 20 MG tablet, Take 1 tablet (20 mg total) by mouth 4 (four) times daily. As needed for abdominal cramping (Patient taking differently: Take 20 mg by mouth 4 (four) times daily as needed (upsset stomach). As needed for abdominal cramping), Disp: 120 tablet, Rfl: 0 .  gabapentin (NEURONTIN) 600 MG tablet, TAKE 6 TABLETS (3,600 MG TOTAL) BY MOUTH AT BEDTIME., Disp: 180 tablet, Rfl: 1 .  glucose blood test strip, 4x daily, Disp: 150 each, Rfl: 12 .  HYDROcodone-acetaminophen (NORCO/VICODIN) 5-325 MG tablet, Take 1 tablet by mouth every 6 (six) hours as needed for moderate pain., Disp: 10 tablet, Rfl: 0 .  Insulin Glargine (LANTUS SOLOSTAR) 100 UNIT/ML Solostar Pen, Inject 34 Units into the skin daily. (Patient taking differently: Inject 40 Units into the skin daily. ), Disp: 15 mL, Rfl: 1 .  insulin lispro (HUMALOG KWIKPEN) 100 UNIT/ML KwikPen, Inject 18 units into skin 3 times daily with ,eals. Max dosage 60 units daily. DX E11.65 (Patient taking differently: Inject 18 Units into the skin 3 (three) times daily. Max dosage 60 units daily.), Disp: 15 mL, Rfl: 11 .  metFORMIN (GLUCOPHAGE) 1000 MG tablet, Take 1 tablet (1,000 mg total) by mouth 2 (two) times daily with a meal., Disp: 180 tablet, Rfl: 3 .  metoprolol  succinate (TOPROL-XL) 50 MG 24 hr tablet, Take 1 and 1/2 tablets by mouth daily (Patient taking differently: Take 75 mg by mouth daily. Take 1 and 1/2 tablets by mouth daily), Disp: 135 tablet, Rfl: 3 .  montelukast (SINGULAIR) 10 MG tablet, Take 10 mg by mouth daily., Disp: , Rfl:  .  nitroGLYCERIN (NITRODUR - DOSED IN MG/24 HR) 0.4 mg/hr patch, Place 1 patch (0.4 mg total) onto the skin daily., Disp: 30 patch, Rfl: 6 .  nitroGLYCERIN (NITROSTAT) 0.4 MG SL tablet, Place 0.4 mg under the tongue every 5 (five) minutes as needed for chest pain., Disp: , Rfl:  .  nortriptyline (PAMELOR) 50 MG capsule, TAKE 1 CAPSULE (50 MG TOTAL) BY MOUTH AT  BEDTIME., Disp: 30 capsule, Rfl: 2 .  pregabalin (LYRICA) 150 MG capsule, Take 1 capsule (150 mg total) by mouth 2 (two) times daily., Disp: 60 capsule, Rfl: 2 .  ramipril (ALTACE) 5 MG capsule, TAKE 2 CAPSULES (10 MG TOTAL) BY MOUTH DAILY. (Patient taking differently: Take 10 mg by mouth daily. ), Disp: 60 capsule, Rfl: 2 .  silver sulfADIAZINE (SILVADENE) 1 % cream, Apply pea-sized amount to wound daily., Disp: 50 g, Rfl: 0 .  Continuous Blood Gluc Receiver (FREESTYLE LIBRE 14 DAY READER) DEVI, 1 Device by Does not apply route as directed., Disp: 1 each, Rfl: 0 .  Continuous Blood Gluc Sensor (FREESTYLE LIBRE 14 DAY SENSOR) MISC, 1 Device by Does not apply route as directed., Disp: 2 each, Rfl: 11 .  DULoxetine (CYMBALTA) 20 MG capsule, Take 1 capsule (20 mg total) by mouth daily., Disp: 30 capsule, Rfl: 6  Social History   Tobacco Use  Smoking Status Never Smoker  Smokeless Tobacco Never Used    Allergies  Allergen Reactions  . Ambien [Zolpidem Tartrate] Other (See Comments)    HALLUCINATIONS  . Shellfish Allergy Anaphylaxis    Seafood   Objective:  There were no vitals filed for this visit. There is no height or weight on file to calculate BMI. Constitutional Well developed. Well nourished.  Vascular Dorsalis pedis faintly pulses palpable bilaterally. Posterior tibial faintly pulses palpable bilaterally. Capillary refill normal to all digits.  No cyanosis or clubbing noted. Pedal hair growth normal.  Neurologic Normal speech. Oriented to person, place, and time. Epicritic sensation to light touch grossly present bilaterally.  Dermatologic Nails well groomed and normal in appearance. No open wounds. No skin lesions.  Orthopedic:  Right second digit dusky/discoloration noted to the digit.  No open wound or lesion noted.  Mild malodor present.  No other clinical signs of infection noted.  No osteomyelitis noted.   Radiographs: 2 views of skeletally mature adult foot: No  cortical irregularity or destruction noted at the second digit.  No osteomyelitic changes noted.  No soft tissue emphysema noted. Assessment:   1. 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 Type 2 diabetes mellitus with diabetic polyneuropathy, with long-term current use of insulin (HCC)   3. Gangrene of toe ([place])    Plan:  Patient was evaluated and treated and all questions answered.  Right second digit gangrene beginning stages dry stable -I explained to the patient the etiology of gangrene with the impression of the vascular flow to the lower extremity.  Patient may have a small vessel disease that is likely leading to beginning dusky changes.  However I explained to the patient that unfortunately there are no interventions that could be done to improve vascular flow given that this is a small vessel disease related.  It is simply wait and watch to allow for full demarcation of the gangrene. -Patient will follow with Dr. [name] on Friday to evaluate the digit.  I have asked the patient to continue doing Betadine wet-to-dry dressing changes to keep the toe covered.  Patient states understanding.  Return in about 2 days (around 09/05/2019).

## 2019-09-05 ENCOUNTER — Ambulatory Visit (INDEPENDENT_AMBULATORY_CARE_PROVIDER_SITE_OTHER): Payer: Medicare Other | Admitting: Podiatry

## 2019-09-05 ENCOUNTER — Ambulatory Visit: Payer: Medicare Other | Attending: Family Medicine | Admitting: Family Medicine

## 2019-09-05 ENCOUNTER — Other Ambulatory Visit: Payer: Self-pay

## 2019-09-05 ENCOUNTER — Encounter: Payer: Self-pay | Admitting: Family Medicine

## 2019-09-05 VITALS — BP 135/89 | HR 89 | Ht 74.0 in | Wt 246.0 lb

## 2019-09-05 DIAGNOSIS — E11621 Type 2 diabetes mellitus with foot ulcer: Secondary | ICD-10-CM | POA: Diagnosis not present

## 2019-09-05 DIAGNOSIS — E1142 Type 2 diabetes mellitus with diabetic polyneuropathy: Secondary | ICD-10-CM | POA: Diagnosis not present

## 2019-09-05 DIAGNOSIS — E1165 Type 2 diabetes mellitus with hyperglycemia: Secondary | ICD-10-CM

## 2019-09-05 DIAGNOSIS — D649 Anemia, unspecified: Secondary | ICD-10-CM | POA: Diagnosis not present

## 2019-09-05 DIAGNOSIS — I10 Essential (primary) hypertension: Secondary | ICD-10-CM | POA: Diagnosis not present

## 2019-09-05 DIAGNOSIS — S90821D Blister (nonthermal), right foot, subsequent encounter: Secondary | ICD-10-CM

## 2019-09-05 DIAGNOSIS — Z09 Encounter for follow-up examination after completed treatment for conditions other than malignant neoplasm: Secondary | ICD-10-CM | POA: Diagnosis not present

## 2019-09-05 DIAGNOSIS — Z79899 Other long term (current) drug therapy: Secondary | ICD-10-CM | POA: Diagnosis not present

## 2019-09-05 DIAGNOSIS — L089 Local infection of the skin and subcutaneous tissue, unspecified: Secondary | ICD-10-CM

## 2019-09-05 DIAGNOSIS — R200 Anesthesia of skin: Secondary | ICD-10-CM | POA: Insufficient documentation

## 2019-09-05 DIAGNOSIS — L97519 Non-pressure chronic ulcer of other part of right foot with unspecified severity: Secondary | ICD-10-CM

## 2019-09-05 DIAGNOSIS — M79641 Pain in right hand: Secondary | ICD-10-CM | POA: Insufficient documentation

## 2019-09-05 DIAGNOSIS — M79642 Pain in left hand: Secondary | ICD-10-CM | POA: Diagnosis not present

## 2019-09-05 DIAGNOSIS — Z955 Presence of coronary angioplasty implant and graft: Secondary | ICD-10-CM | POA: Insufficient documentation

## 2019-09-05 DIAGNOSIS — E1140 Type 2 diabetes mellitus with diabetic neuropathy, unspecified: Secondary | ICD-10-CM

## 2019-09-05 DIAGNOSIS — I2510 Atherosclerotic heart disease of native coronary artery without angina pectoris: Secondary | ICD-10-CM | POA: Insufficient documentation

## 2019-09-05 DIAGNOSIS — R238 Other skin changes: Secondary | ICD-10-CM

## 2019-09-05 DIAGNOSIS — Z794 Long term (current) use of insulin: Secondary | ICD-10-CM

## 2019-09-05 DIAGNOSIS — G479 Sleep disorder, unspecified: Secondary | ICD-10-CM | POA: Insufficient documentation

## 2019-09-05 DIAGNOSIS — Z7982 Long term (current) use of aspirin: Secondary | ICD-10-CM | POA: Insufficient documentation

## 2019-09-05 DIAGNOSIS — L97511 Non-pressure chronic ulcer of other part of right foot limited to breakdown of skin: Secondary | ICD-10-CM

## 2019-09-05 LAB — GLUCOSE, POCT (MANUAL RESULT ENTRY): POC Glucose: 150 mg/dL — AB (ref 70–99)

## 2019-09-05 MED FILL — GABAPENTIN 600 MG TABLET: 600 | 30 days supply | Qty: 180 | Fill #1

## 2019-09-05 NOTE — Progress Notes (Signed)
Established Patient Office Visit  Subjective:  Patient ID: Austin Vaughan, male    DOB: 03-19-1971  Age: 49 y.o. MRN: [id number]  CC:  Chief Complaint  Patient presents with  . Hospitalization Follow-up    HPI Austin Vaughan presents follow-up status post hospitalization at [facility] from 08/23/2019-08/27/2019 due to a diabetic foot ulcer/cellulitis.  He reports that he saw his podiatrist, Dr. [name] this morning and his wound was redressed.  He reports that he continues to have chronic issues with pain/numbness in his hands and feet.  Pain is always at an 8 or greater.  He continues to take his Lyrica and gabapentin.  He did not hear back from pain management referral.  He did follow-up at [facility] regarding family history of ALS and his peripheral neuropathy and he was told that his symptoms are coming from peripheral neuropathy and did not appear to be related to ALS.  He would like to have a new referral to pain management.  He reports that even though he is on opioid therapy currently for pain related to diabetic foot ulcer that this has not really caused any significant decrease in his overall pain level from diabetic neuropathy. He continues to have pain in his hands constantly and he often has difficulty sleeping due to his pain.           He reports that since hospitalization his appetite has been decreased and his blood sugars are generally in the low 100s.  He has seen his endocrinologist recently.  His most recent hemoglobin A1c done during hospitalization was 7.1.  He does not have any current increased thirst or urinary frequency.  He denies any current issues with abdominal pain, no nausea, vomiting, diarrhea or constipation.    Past Medical History:  Diagnosis Date  . Coronary artery disease   . Diabetes mellitus without complication ([place])   . Hypertension     Past Surgical History:  Procedure Laterality Date  . CORONARY ANGIOPLASTY WITH STENT PLACEMENT  07/2002    Family History    Problem Relation Age of Onset  . ALS Mother   . Lung cancer Father   . ALS Maternal Aunt   . Heart disease Paternal Grandmother   . Heart disease Paternal Grandfather     Social History   Socioeconomic History  . Marital status: Married    Spouse name: Not on file  . Number of children: 2  . Years of education: 37  . Highest education level: Not on file  Occupational History  . Occupation: disablitiy  Tobacco Use  . Smoking status: Never Smoker  . Smokeless tobacco: Never Used  Substance and Sexual Activity  . Alcohol use: Not Currently  . Drug use: Never  . Sexual activity: Yes  Other Topics Concern  . Not on file  Social History Narrative   Wife and patient relocated to [place] from [place] a few months ago. - 06/11/18   He is on disability since 2019.     He lives with wife and daughter in a one-level apartment.    Right handed   Social Determinants of Health   Financial Resource Strain:   . Difficulty of Paying Living Expenses: Not on file  Food Insecurity:   . Worried About Charity fundraiser in the Last Year: Not on file  . Ran Out of Food in the Last Year: Not on file  Transportation Needs:   . Lack of Transportation (Medical): Not on file  .  Lack of Transportation (Non-Medical): Not on file  Physical Activity:   . Days of Exercise per Week: Not on file  . Minutes of Exercise per Session: Not on file  Stress:   . Feeling of Stress : Not on file  Social Connections:   . Frequency of Communication with Friends and Family: Not on file  . Frequency of Social Gatherings with Friends and Family: Not on file  . Attends Religious Services: Not on file  . Active Member of Clubs or Organizations: Not on file  . Attends Archivist Meetings: Not on file  . Marital Status: Not on file  Intimate Partner Violence:   . Fear of Current or Ex-Partner: Not on file  . Emotionally Abused: Not on file  . Physically Abused: Not on file  . Sexually Abused:  Not on file    Outpatient Medications Prior to Visit  Medication Sig Dispense Refill  . albuterol (PROAIR HFA) 108 (90 Base) MCG/ACT inhaler Inhale 2 puffs into the lungs every 6 (six) hours as needed for wheezing or shortness of breath. 1 Inhaler 11  . amLODipine (NORVASC) 10 MG tablet TAKE 1 TABLET (10 MG TOTAL) BY MOUTH DAILY. TO LOWER BLOOD PRESSURE (Patient taking differently: Take 10 mg by mouth every evening. To lower blood pressure) 90 tablet 0  . aspirin EC 81 MG tablet Take 81 mg by mouth daily.    [name] atorvastatin (LIPITOR) 80 MG tablet Take 1 tablet (80 mg total) by mouth daily. 90 tablet 3  . Blood Glucose Monitoring Suppl (TRUE METRIX METER) w/Device KIT Use daily to check blood sugar 4 times daily. 1 kit 0  . Continuous Blood Gluc Receiver (FREESTYLE LIBRE 14 DAY READER) DEVI 1 Device by Does not apply route as directed. 1 each 0  . Continuous Blood Gluc Sensor (FREESTYLE LIBRE 14 DAY SENSOR) MISC 1 Device by Does not apply route as directed. 2 each 11  . dapagliflozin propanediol (FARXIGA) 10 MG TABS tablet Take 10 mg by mouth daily. 30 tablet 6  . dicyclomine (BENTYL) 20 MG tablet Take 1 tablet (20 mg total) by mouth 4 (four) times daily. As needed for abdominal cramping (Patient taking differently: Take 20 mg by mouth 4 (four) times daily as needed (upsset stomach). As needed for abdominal cramping) 120 tablet 0  . DULoxetine (CYMBALTA) 20 MG capsule Take 1 capsule (20 mg total) by mouth daily. 30 capsule 6  . gabapentin (NEURONTIN) 600 MG tablet TAKE 6 TABLETS (3,600 MG TOTAL) BY MOUTH AT BEDTIME. 180 tablet 1  . glucose blood test strip 4x daily 150 each 12  . HYDROcodone-acetaminophen (NORCO/VICODIN) 5-325 MG tablet Take 1 tablet by mouth every 6 (six) hours as needed for moderate pain. 10 tablet 0  . Insulin Glargine (LANTUS SOLOSTAR) 100 UNIT/ML Solostar Pen Inject 34 Units into the skin daily. (Patient taking differently: Inject 40 Units into the skin daily. ) 15 mL 1  .  insulin lispro (HUMALOG KWIKPEN) 100 UNIT/ML KwikPen Inject 18 units into skin 3 times daily with ,eals. Max dosage 60 units daily. DX E11.65 (Patient taking differently: Inject 18 Units into the skin 3 (three) times daily. Max dosage 60 units daily.) 15 mL 11  . metFORMIN (GLUCOPHAGE) 1000 MG tablet Take 1 tablet (1,000 mg total) by mouth 2 (two) times daily with a meal. 180 tablet 3  . metoprolol succinate (TOPROL-XL) 50 MG 24 hr tablet Take 1 and 1/2 tablets by mouth daily (Patient taking differently: Take 75 mg by  mouth daily. Take 1 and 1/2 tablets by mouth daily) 135 tablet 3  . montelukast (SINGULAIR) 10 MG tablet Take 10 mg by mouth daily.    . nitroGLYCERIN (NITRODUR - DOSED IN MG/24 HR) 0.4 mg/hr patch Place 1 patch (0.4 mg total) onto the skin daily. 30 patch 6  . nitroGLYCERIN (NITROSTAT) 0.4 MG SL tablet Place 0.4 mg under the tongue every 5 (five) minutes as needed for chest pain.    . nortriptyline (PAMELOR) 50 MG capsule TAKE 1 CAPSULE (50 MG TOTAL) BY MOUTH AT BEDTIME. 30 capsule 2  . pregabalin (LYRICA) 150 MG capsule Take 1 capsule (150 mg total) by mouth 2 (two) times daily. 60 capsule 2  . ramipril (ALTACE) 5 MG capsule TAKE 2 CAPSULES (10 MG TOTAL) BY MOUTH DAILY. (Patient taking differently: Take 10 mg by mouth daily. ) 60 capsule 2  . silver sulfADIAZINE (SILVADENE) 1 % cream Apply pea-sized amount to wound daily. (Patient not taking: Reported on 09/05/2019) 50 g 0   No facility-administered medications prior to visit.    Allergies  Allergen Reactions  . Ambien [Zolpidem Tartrate] Other (See Comments)    HALLUCINATIONS  . Shellfish Allergy Anaphylaxis    Seafood    ROS Review of Systems  Constitutional: Positive for fatigue. Negative for chills and fever.  HENT: Negative for sore throat and trouble swallowing.   Eyes: Negative for photophobia and visual disturbance.  Respiratory: Negative for cough and shortness of breath.   Cardiovascular: Negative for chest pain,  palpitations and leg swelling.  Gastrointestinal: Negative for abdominal pain, blood in stool, constipation, diarrhea and nausea.  Endocrine: Negative for polydipsia, polyphagia and polyuria.  Genitourinary: Negative for dysuria and frequency.  Musculoskeletal: Positive for arthralgias and gait problem.  Neurological: Positive for numbness. Negative for dizziness and headaches.  Hematological: Negative for adenopathy. Does not bruise/bleed easily.  Psychiatric/Behavioral: Positive for dysphoric mood and sleep disturbance. Negative for self-injury and suicidal ideas.      Objective:    Physical Exam  Constitutional: He is oriented to person, place, and time. He appears well-developed and well-nourished.  WNWD larger framed male sitting on chair in exam room in NAD  but appears fatigued. Wearing mask as per office COVID-19 precautions and has cane leaned against nearby wall. Has bandaging to right  foot and surgical boot.  Neck: No JVD present.  Cardiovascular: Normal rate and regular rhythm.  Pulmonary/Chest: Effort normal and breath sounds normal.  Abdominal: Soft. There is no abdominal tenderness. There is no rebound and no guarding.  Musculoskeletal:        General: No tenderness or edema.     Cervical back: Normal range of motion and neck supple.  Lymphadenopathy:    He has no cervical adenopathy.  Neurological: He is alert and oriented to person, place, and time.  Psychiatric: He has a normal mood and affect. His behavior is normal.  Slightly flattened affect  Nursing note and vitals reviewed.   BP 135/89   Pulse 89   Ht 6' 2"  (1.88 m)   Wt 246 lb (111.6 kg)   SpO2 98%   BMI 31.58 kg/m  Wt Readings from Last 3 Encounters:  09/05/19 246 lb (111.6 kg)  08/23/19 244 lb 4.3 oz (110.8 kg)  06/25/19 242 lb 12.8 oz (110.1 kg)     Health Maintenance Due  Topic Date Due  . FOOT EXAM  01/20/1979  . COLONOSCOPY  01/20/2019  . INFLUENZA VACCINE  03/15/2019    Foot exam  has  been done by Endocrinology and Podiatry  Lab Results  Component Value Date   TSH 0.96 10/17/2018   Lab Results  Component Value Date   WBC 7.4 08/25/2019   HGB 12.7 (L) 08/25/2019   HCT 41.1 08/25/2019   MCV 86.9 08/25/2019   PLT 285 08/25/2019   Lab Results  Component Value Date   NA 142 08/25/2019   K 4.3 08/25/2019   CO2 28 08/25/2019   GLUCOSE 89 08/25/2019   BUN 11 08/25/2019   CREATININE 0.73 08/25/2019   BILITOT 0.7 04/29/2019   ALKPHOS 130 (H) 04/29/2019   AST 10 04/29/2019   ALT 18 04/29/2019   PROT 7.6 04/29/2019   ALBUMIN 4.8 04/29/2019   CALCIUM 9.0 08/25/2019   ANIONGAP 10 08/25/2019   GFR 113.85 10/17/2018   Lab Results  Component Value Date   CHOL 121 04/29/2019   Lab Results  Component Value Date   HDL 43 04/29/2019   Lab Results  Component Value Date   LDLCALC 61 04/29/2019   Lab Results  Component Value Date   TRIG 86 04/29/2019   Lab Results  Component Value Date   CHOLHDL 2.8 04/29/2019   Lab Results  Component Value Date   HGBA1C 7.1 (H) 08/23/2019      Assessment & Plan:  1. Type 2 diabetes mellitus with hyperglycemia, with long-term current use of insulin [facility]) Patient has been following up with endocrinology and has improved the control of his diabetes as his  Hgb A1c during his recent hospitalization was 7.1 on 08/23/2019. He is to continue his current medications, low carb diet and blood sugar monitoring. Random blood sugar at today's visit was 150.  - POCT glucose (manual entry) - Comprehensive metabolic panel  2. Hospital discharge follow-up; 3. Diabetic ulcer of toe of the right foot associated with Type 2 diabetes, unspecified ulcer stage Records from patient's recent hospitalization reviewed and discussed with the patient. He has been seen in follow-up of his foot ulcer twice this week by podiatry including earlier today. Podiatry notes have been reviewed.   4. Diabetic polyneuropathy associated with type 2 diabetes  mellitus ([place]) 5. Painful diabetic neuropathy ([place]); 6. Encounter for long term use of medication Referral will be placed to pain management clinic as patient with chronic painful peripheral neuropathy that is not controlled with current medication. CMET in follow-up of long term use of medication.  - Comprehensive metabolic panel - Ambulatory referral to Pain Clinic  6. Mild anemia Patient with Hgb of 12.7 on 08/25/19 during his hospitalization. Will recheck CBC to see if there has been any worsening of anemia which may require further evaluation - CBC   An After Visit Summary was printed and given to the patient.  Follow-up: Return in about 3 months (around 12/04/2019) for DM, chronic issues; sooner if needed.   [name], MD

## 2019-09-05 NOTE — Progress Notes (Signed)
Went to foot doctor this morning. Still having pain in feet and hands.

## 2019-09-05 NOTE — Progress Notes (Signed)
  Subjective:  Patient ID: Austin Vaughan, male    DOB: 1971/08/07,  MRN: [id number]  Chief Complaint  Patient presents with  . Toe Pain    Follow up for possible gangrene in right 2nd digit. Pt states he has some pain in this digit. Denies fever/chills/nausea/vomiting.    49 y.o. male presents for wound care. Hx confirmed with patient.  Objective:  Physical Exam: Wound Location: right 2nd toe Wound Measurement: 3x0.5 Wound Base: Granular/Healthy Peri-wound: Normal Exudate: Moderate amount Bloody exudate wound without warmth, erythema, signs of acute infection and appears improved compared to last recheck  Assessment:   1. Ulcer of toe, right, limited to breakdown of skin (HCC)   2. Skin bulla   3. Infected blister of right foot, subsequent encounter     Plan:  Patient was evaluated and treated and all questions answered.  Ulcer right second toe -Lumicain applied to staunch bleeding. -Dressing applied consisting of foam border dressing, significant cast padding, kerlix, ACE bandage. Leave intact until next week -Offload ulcer with surgical shoe -No signs of frank gangrene today. Discussed previous vascular studies and the possibility of small vessel disesae.  Return in about 1 week (around 09/12/2019) for Wound Care, Right.

## 2019-09-06 ENCOUNTER — Encounter: Payer: Self-pay | Admitting: Family Medicine

## 2019-09-06 LAB — COMPREHENSIVE METABOLIC PANEL WITH GFR
ALT: 28 IU/L (ref 0–44)
AST: 12 IU/L (ref 0–40)
Albumin/Globulin Ratio: 1.7 (ref 1.2–2.2)
Albumin: 4.5 g/dL (ref 4.0–5.0)
Alkaline Phosphatase: 120 IU/L — ABNORMAL HIGH (ref 39–117)
BUN/Creatinine Ratio: 11 (ref 9–20)
BUN: 9 mg/dL (ref 6–24)
Bilirubin Total: 0.5 mg/dL (ref 0.0–1.2)
CO2: 26 mmol/L (ref 20–29)
Calcium: 9.3 mg/dL (ref 8.7–10.2)
Chloride: 102 mmol/L (ref 96–106)
Creatinine, Ser: 0.79 mg/dL (ref 0.76–1.27)
GFR calc Af Amer: 121 mL/min/1.73
GFR calc non Af Amer: 105 mL/min/1.73
Globulin, Total: 2.6 g/dL (ref 1.5–4.5)
Glucose: 134 mg/dL — ABNORMAL HIGH (ref 65–99)
Potassium: 4.4 mmol/L (ref 3.5–5.2)
Sodium: 141 mmol/L (ref 134–144)
Total Protein: 7.1 g/dL (ref 6.0–8.5)

## 2019-09-06 LAB — CBC
Hematocrit: 45.1 % (ref 37.5–51.0)
Hemoglobin: 14.6 g/dL (ref 13.0–17.7)
MCH: 26.9 pg (ref 26.6–33.0)
MCHC: 32.4 g/dL (ref 31.5–35.7)
MCV: 83 fL (ref 79–97)
Platelets: 362 x10E3/uL (ref 150–450)
RBC: 5.43 x10E6/uL (ref 4.14–5.80)
RDW: 11.8 % (ref 11.6–15.4)
WBC: 10.2 x10E3/uL (ref 3.4–10.8)

## 2019-09-12 ENCOUNTER — Ambulatory Visit (INDEPENDENT_AMBULATORY_CARE_PROVIDER_SITE_OTHER): Payer: Medicare Other | Admitting: Podiatry

## 2019-09-12 ENCOUNTER — Telehealth: Payer: Self-pay | Admitting: *Deleted

## 2019-09-12 ENCOUNTER — Other Ambulatory Visit: Payer: Self-pay

## 2019-09-12 VITALS — Temp 95.7°F

## 2019-09-12 DIAGNOSIS — L97511 Non-pressure chronic ulcer of other part of right foot limited to breakdown of skin: Secondary | ICD-10-CM

## 2019-09-12 MED ORDER — TRAMADOL HCL 50 MG PO TABS: 50.0000 mg | ORAL_TABLET | Freq: Three times a day (TID) | ORAL | 0 refills | Status: AC | PRN

## 2019-09-12 MED ORDER — SANTYL 250 UNIT/GM EX OINT: 1.0000 "application " | TOPICAL_OINTMENT | Freq: Every day | CUTANEOUS | 5 refills | Status: DC

## 2019-09-12 NOTE — Telephone Encounter (Signed)
-----   Message from Park Liter, DPM sent at 09/12/2019  3:46 PM EST ----- Can we order Melburn Popper

## 2019-09-12 NOTE — Telephone Encounter (Signed)
Faxed required form, clinicals and demographics to [facility]. 

## 2019-09-12 NOTE — Progress Notes (Signed)
  Subjective:  Patient ID: Austin Vaughan, male    DOB: 1971/01/28,  MRN: [id number]  Chief Complaint  Patient presents with  . Wound Check    R foot, second toe. Pt stated, "The pain hasn't improved - it comes and goes, but it's 10/10. Happens most often when it's cold and at night. I've kept the dressing on. AM glucose = 107 mg/dL".    49 y.o. male presents for wound care. Hx confirmed with patient. Objective:  Physical Exam: Wound Location: Right 2nd toe Wound Measurement: 4.5x4 Wound Base: Mixed Granular/Fibrotic Peri-wound: Normal Exudate: Scant/small amount Serosanguinous exudate wound without warmth, erythema, signs of acute infection  Assessment:   1. Ulcer of toe, right, limited to breakdown of skin [facility])    Plan:  Patient was evaluated and treated and all questions answered.  Ulcer Right 2nd toe -Dressing applied consisting of actisorb, mepilex border, well padded Kerlix and ACE -Discussed that we need to keep the pressure off of his toes. -Offload ulcer with surgical shoe -Wound cleansed and debrided -Refill tramadol given debridement today.  Procedure: Selective Debridement of Wound Rationale: Removal of devitalized tissue from the wound to promote healing.  Pre-Debridement Wound Measurements:  4.5 cm x 4 cm x 0.1 cm  Post-Debridement Wound Measurements: same as pre-debridement. Type of Debridement: sharp selective Tissue Removed: Devitalized soft-tissue Dressing: Dry, sterile, compression dressing. Disposition: Patient tolerated procedure well. Patient to return in 1 week for follow-up.  Return in about 1 week (around 09/19/2019) for Wound Care.

## 2019-09-16 ENCOUNTER — Encounter: Payer: Self-pay | Admitting: Family Medicine

## 2019-09-16 ENCOUNTER — Encounter: Payer: Self-pay | Admitting: Internal Medicine

## 2019-09-16 MED FILL — METOPROLOL SUCCINATE ER 50: 50 | 30 days supply | Qty: 45 | Fill #4

## 2019-09-16 MED FILL — NORTRIPTYLINE HCL 50 MG CAP: 50 | 30 days supply | Qty: 30 | Fill #1

## 2019-09-17 ENCOUNTER — Encounter: Payer: Self-pay | Admitting: *Deleted

## 2019-09-17 ENCOUNTER — Ambulatory Visit (INDEPENDENT_AMBULATORY_CARE_PROVIDER_SITE_OTHER): Payer: Medicare Other | Admitting: Gastroenterology

## 2019-09-17 ENCOUNTER — Other Ambulatory Visit: Payer: Self-pay

## 2019-09-17 ENCOUNTER — Encounter: Payer: Self-pay | Admitting: Gastroenterology

## 2019-09-17 VITALS — BP 120/70 | HR 84 | Temp 96.9°F | Ht 74.0 in | Wt 247.0 lb

## 2019-09-17 DIAGNOSIS — R194 Change in bowel habit: Secondary | ICD-10-CM

## 2019-09-17 DIAGNOSIS — R1030 Lower abdominal pain, unspecified: Secondary | ICD-10-CM | POA: Diagnosis not present

## 2019-09-17 MED ORDER — HYOSCYAMINE SULFATE 0.125 MG SL SUBL: 0.1250 mg | SUBLINGUAL_TABLET | Freq: Four times a day (QID) | SUBLINGUAL | 2 refills | Status: DC | PRN

## 2019-09-17 MED FILL — OSCIMIN SL 0.125 MG TABLET: 0.125 | 7 days supply | Qty: 30 | Fill #0

## 2019-09-17 NOTE — Patient Instructions (Addendum)
If you are age 51 or older, your body mass index should be between 23-30. Your Body mass index is 31.71 kg/m. If this is out of the aforementioned range listed, please consider follow up with your Primary Care Provider.  If you are age 90 or younger, your body mass index should be between 19-25. Your Body mass index is 31.71 kg/m. If this is out of the aformentioned range listed, please consider follow up with your Primary Care Provider.   Stop the Dicyclomine and start the Hyoscyamine .   It was a pleasure to see you today!  Dr. Loletha Carrow   Food Guidelines for those with chronic digestive trouble:  Many people have difficulty digesting certain foods, causing a variety of distressing and embarrassing symptoms such as abdominal pain, bloating and gas.  These foods may need to be avoided or consumed in small amounts.  Here are some tips that might be helpful for you.  1.   Lactose intolerance is the difficulty or complete inability to digest lactose, the natural sugar in milk and anything made from milk.  This condition is harmless, common, and can begin any time during life.  Some people can digest a modest amount of lactose while others cannot tolerate any.  Also, not all dairy products contain equal amounts of lactose.  For example, hard cheeses such as parmesan have less lactose than soft cheeses such as cheddar.  Yogurt has less lactose than milk or cheese.  Many packaged foods (even many brands of bread) have milk, so read ingredient lists carefully.  It is difficult to test for lactose intolerance, so just try avoiding lactose as much as possible for a week and see what happens with your symptoms.  If you seem to be lactose intolerant, the best plan is to avoid it (but make sure you get calcium from another source).  The next best thing is to use lactase enzyme supplements, available over the counter everywhere.  Just know that many lactose intolerant people need to take several tablets with each  serving of dairy to avoid symptoms.  Lastly, a lot of restaurant food is made with milk or butter.  Many are things you might not suspect, such as mashed potatoes, rice and pasta (cooked with butter) and "grilled" items.  If you are lactose intolerant, it never hurts to ask your server what has milk or butter.  2.   Fiber is an important part of your diet, but not all fiber is well-tolerated.  Insoluble fiber such as bran is often consumed by normal gut bacteria and converted into gas.  Soluble fiber such as oats, squash, carrots and green beans are typically tolerated better.  3.   Some types of carbohydrates can be poorly digested.  Examples include: fructose (apples, cherries, pears, raisins and other dried fruits), fructans (onions, zucchini, large amounts of wheat), sorbitol/mannitol/xylitol and sucralose/Splenda (common artificial sweeteners), and raffinose (lentils, broccoli, cabbage, asparagus, brussel sprouts, many types of beans).  Do a Development worker, community for [employer] and you will find helpful information. Beano, a dietary supplement, will often help with raffinose-containing foods.  As with lactase tablets, you may need several per serving.  4.   Whenever possible, avoid processed food&meats and chemical additives.  High fructose corn syrup, a common sweetener, may be difficult to digest.  Eggs and soy (comes from the soybean, and added to many foods now) are other common bloating/gassy foods.  5.  Regarding gluten:  gluten is a protein mainly found in  wheat, but also rye and barley.  There is a condition called celiac sprue, which is an inflammatory reaction in the small intestine causing a variety of digestive symptoms.  Blood testing is highly reliable to look for this condition, and sometimes upper endoscopy with small bowel biopsies may be necessary to make the diagnosis.  Many patients who test negative for celiac sprue report improvement in their digestive symptoms when they switch to a  gluten-free diet.  However, in these "non-celiac gluten sensitive" patients, the true role of gluten in their symptoms is unclear.  Reducing carbohydrates in general may decrease the gas and bloating caused when gut bacteria consume carbs. Also, some of these patients may actually be intolerant of the baker's yeast in bread products rather than the gluten.  Flatbread and other reduced yeast breads might therefore be tolerated.  There is no specific testing available for most food intolerances, which are discovered mainly by dietary elimination.  Please do not embark on a gluten free diet unless directed by your doctor, as it is highly restrictive, and may lead to nutritional deficiencies if not carefully monitored.  Lastly, beware of internet claims offering "personalized" tests for food intolerances.  Such testing has no reliable scientific evidence to support its reliability and correlation to symptoms.    6.  The best advice is old advice, especially for those with chronic digestive trouble - try to eat "clean".  Balanced diet, avoid processed food, plenty of fruits and vegetables, cut down the sugar, minimal alcohol, avoid tobacco. Make time to care for yourself, get enough sleep, exercise when you can, reduce stress.  Your guts will thank you for it.   - Dr. Sherlynn [facility]

## 2019-09-17 NOTE — Progress Notes (Addendum)
[facility] Consult Note:  History: Austin Vaughan 09/17/2019  Referring provider: [name], MD  Reason for consult/chief complaint: Abdominal Pain (Lower abdomen, medication ineffective) and Diarrhea (Frequently, 3 times weekly)   Subjective  HPI:  This is a 49 year old man referred by primary care for chronic lower abdominal pain.  He says it started at least several years ago when he was living in [facility], and does not recall any clear trigger at the onset of symptoms.  He began having mid lower sharp crampy abdominal pain that might radiate in a bandlike fashion across the lower abdomen.  It might be associated with diarrhea, though other times he would not have a BM for several days.  He describes a colonoscopy and endoscopy, and then was prescribed dicyclomine.  Typically, as needed use of that medicine would seem to help until several months ago when it no longer seem to be effective.  He still gets the episodes frequently, and has diarrhea with it perhaps 3 or 4 times a week.  He denies rectal bleeding.  Diagnosed with coronary artery disease and diabetes in 2013, underwent coronary stent placement.  He has problems with peripheral neuropathy, and reports a work-up at [place] last year out of concerns for family history of ALS.  He was told that his neuropathic pain was from diabetic neuropathy.  He also has chronic diabetic foot ulcer.  Austin Vaughan reports that he sometimes avoids eating for fear that it will cause the lower abdominal pain. Today it is low-grade at about a 2 out of 10, but he is concerned that if he eats it will make the pain worse.  No prior abdominal surgery.  ROS:  Review of Systems  Constitutional: Negative for appetite change and unexpected weight change.  HENT: Negative for mouth sores and voice change.   Eyes: Negative for pain and redness.  Respiratory: Negative for cough and shortness of breath.   Cardiovascular: Negative for  chest pain and palpitations.  Genitourinary: Negative for dysuria and hematuria.  Musculoskeletal: Negative for arthralgias and myalgias.       Right foot pain from diabetic ulcer  Skin: Negative for pallor and rash.  Neurological: Negative for weakness and headaches.       Peripheral neuropathy  Hematological: Negative for adenopathy.     Past Medical History: Past Medical History:  Diagnosis Date  . Coronary artery disease   . Diabetes mellitus without complication ([place])   . Hypertension      Past Surgical History: Past Surgical History:  Procedure Laterality Date  . CORONARY ANGIOPLASTY WITH STENT PLACEMENT  07/2002  Dec 2013   Family History: Family History  Problem Relation Age of Onset  . ALS Mother   . Lung cancer Father   . ALS Maternal Aunt   . Heart disease Paternal Grandmother   . Heart disease Paternal Grandfather     Social History: Social History   Socioeconomic History  . Marital status: Married    Spouse name: Not on file  . Number of children: 2  . Years of education: 51  . Highest education level: Not on file  Occupational History  . Occupation: disablitiy  Tobacco Use  . Smoking status: Never Smoker  . Smokeless tobacco: Never Used  Substance and Sexual Activity  . Alcohol use: Not Currently  . Drug use: Never  . Sexual activity: Yes  Other Topics Concern  . Not on file  Social History Narrative   Wife and patient relocated  to [place] from [place] a few months ago. - 06/11/18   He is on disability since 2019.     He lives with wife and daughter in a one-level apartment.    Right handed   Social Determinants of Health   Financial Resource Strain:   . Difficulty of Paying Living Expenses: Not on file  Food Insecurity:   . Worried About Charity fundraiser in the Last Year: Not on file  . Ran Out of Food in the Last Year: Not on file  Transportation Needs:   . Lack of Transportation (Medical): Not on file  . Lack of  Transportation (Non-Medical): Not on file  Physical Activity:   . Days of Exercise per Week: Not on file  . Minutes of Exercise per Session: Not on file  Stress:   . Feeling of Stress : Not on file  Social Connections:   . Frequency of Communication with Friends and Family: Not on file  . Frequency of Social Gatherings with Friends and Family: Not on file  . Attends Religious Services: Not on file  . Active Member of Clubs or Organizations: Not on file  . Attends Archivist Meetings: Not on file  . Marital Status: Not on file    Allergies: Allergies  Allergen Reactions  . Ambien [Zolpidem Tartrate] Other (See Comments)    HALLUCINATIONS  . Shellfish Allergy Anaphylaxis    Seafood    Outpatient Meds: Current Outpatient Medications  Medication Sig Dispense Refill  . albuterol (PROAIR HFA) 108 (90 Base) MCG/ACT inhaler Inhale 2 puffs into the lungs every 6 (six) hours as needed for wheezing or shortness of breath. 1 Inhaler 11  . amLODipine (NORVASC) 10 MG tablet TAKE 1 TABLET (10 MG TOTAL) BY MOUTH DAILY. TO LOWER BLOOD PRESSURE (Patient taking differently: Take 10 mg by mouth every evening. To lower blood pressure) 90 tablet 0  . aspirin EC 81 MG tablet Take 81 mg by mouth daily.    [name] atorvastatin (LIPITOR) 80 MG tablet Take 1 tablet (80 mg total) by mouth daily. 90 tablet 3  . Blood Glucose Monitoring Suppl (TRUE METRIX METER) w/Device KIT Use daily to check blood sugar 4 times daily. 1 kit 0  . collagenase (SANTYL) ointment Apply 1 application topically daily. 30 g 5  . Continuous Blood Gluc Receiver (FREESTYLE LIBRE 14 DAY READER) DEVI 1 Device by Does not apply route as directed. 1 each 0  . Continuous Blood Gluc Sensor (FREESTYLE LIBRE 14 DAY SENSOR) MISC 1 Device by Does not apply route as directed. 2 each 11  . dapagliflozin propanediol (FARXIGA) 10 MG TABS tablet Take 10 mg by mouth daily. 30 tablet 6  . dicyclomine (BENTYL) 20 MG tablet Take 1 tablet (20 mg  total) by mouth 4 (four) times daily. As needed for abdominal cramping (Patient taking differently: Take 20 mg by mouth 4 (four) times daily as needed (upsset stomach). As needed for abdominal cramping) 120 tablet 0  . DULoxetine (CYMBALTA) 20 MG capsule Take 1 capsule (20 mg total) by mouth daily. 30 capsule 6  . gabapentin (NEURONTIN) 600 MG tablet TAKE 6 TABLETS (3,600 MG TOTAL) BY MOUTH AT BEDTIME. 180 tablet 1  . glucose blood test strip 4x daily 150 each 12  . HYDROcodone-acetaminophen (NORCO/VICODIN) 5-325 MG tablet Take 1 tablet by mouth every 6 (six) hours as needed for moderate pain. 10 tablet 0  . Insulin Glargine (LANTUS SOLOSTAR) 100 UNIT/ML Solostar Pen Inject 34 Units into the skin  daily. (Patient taking differently: Inject 40 Units into the skin daily. ) 15 mL 1  . insulin lispro (HUMALOG KWIKPEN) 100 UNIT/ML KwikPen Inject 18 units into skin 3 times daily with ,eals. Max dosage 60 units daily. DX E11.65 (Patient taking differently: Inject 18 Units into the skin 3 (three) times daily. Max dosage 60 units daily.) 15 mL 11  . metFORMIN (GLUCOPHAGE) 1000 MG tablet Take 1 tablet (1,000 mg total) by mouth 2 (two) times daily with a meal. 180 tablet 3  . metoprolol succinate (TOPROL-XL) 50 MG 24 hr tablet Take 1 and 1/2 tablets by mouth daily (Patient taking differently: Take 75 mg by mouth daily. Take 1 and 1/2 tablets by mouth daily) 135 tablet 3  . montelukast (SINGULAIR) 10 MG tablet Take 10 mg by mouth daily.    . nitroGLYCERIN (NITRODUR - DOSED IN MG/24 HR) 0.4 mg/hr patch Place 1 patch (0.4 mg total) onto the skin daily. 30 patch 6  . nitroGLYCERIN (NITROSTAT) 0.4 MG SL tablet Place 0.4 mg under the tongue every 5 (five) minutes as needed for chest pain.    . nortriptyline (PAMELOR) 50 MG capsule TAKE 1 CAPSULE (50 MG TOTAL) BY MOUTH AT BEDTIME. 30 capsule 2  . pregabalin (LYRICA) 150 MG capsule Take 1 capsule (150 mg total) by mouth 2 (two) times daily. 60 capsule 2  . ramipril  (ALTACE) 5 MG capsule TAKE 2 CAPSULES (10 MG TOTAL) BY MOUTH DAILY. (Patient taking differently: Take 10 mg by mouth daily. ) 60 capsule 2  . silver sulfADIAZINE (SILVADENE) 1 % cream Apply pea-sized amount to wound daily. 50 g 0  . traMADol (ULTRAM) 50 MG tablet Take 1 tablet (50 mg total) by mouth every 8 (eight) hours as needed for up to 5 days. 8 tablet 0  . hyoscyamine (LEVSIN SL) 0.125 MG SL tablet Place 1 tablet (0.125 mg total) under the tongue every 6 (six) hours as needed. 30 tablet 2   No current facility-administered medications for this visit.      ___________________________________________________________________ Objective   Exam:  BP 120/70   Pulse 84   Temp (!) 96.9 F (36.1 C)   Ht _0  (1.88 m)   Wt 247 lb (112 kg)   BMI 31.71 kg/m    General: Chronically ill-appearing man, antalgic gait, right foot in a diabetic ulcer shoe and he walks with a cane  Eyes: sclera anicteric, no redness  ENT: oral mucosa moist without lesions, no cervical or supraclavicular lymphadenopathy  CV: RRR without murmur, S1/S2, no JVD, no peripheral edema  Resp: clear to auscultation bilaterally, normal RR and effort noted  GI: soft, mild suprapubic tenderness, with active bowel sounds. No guarding or palpable organomegaly noted, limited by body habitus  Skin; warm and dry, no rash or jaundice noted  Neuro: awake, alert and oriented x 3. Normal gross motor function and fluent speech  Labs: Recent Hgb A1c - 7.1  CBC Latest Ref Rng & Units 09/05/2019 08/25/2019 08/24/2019  WBC 3.4 - 10.8 x10E3/uL 10.2 7.4 9.2  Hemoglobin 13.0 - 17.7 g/dL 14.6 12.7(L) 13.1  Hematocrit 37.5 - 51.0 % 45.1 41.1 42.0  Platelets 150 - 450 x10E3/uL 362 285 297   CMP Latest Ref Rng & Units 09/05/2019 08/25/2019 08/24/2019  Glucose 65 - 99 mg/dL 134(H) 89 94  BUN 6 - 24 mg/dL _1 Creatinine 0.76 - 1.27 mg/dL 0.79 0.73 0.67  Sodium 134 - 144 mmol/L 141 142 139  Potassium 3.5 - 5.2  mmol/L 4.4 4.3 3.8   Chloride 96 - 106 mmol/L 102 104 106  CO2 20 - 29 mmol/L _0 Calcium 8.7 - 10.2 mg/dL 9.3 9.0 8.7(L)  Total Protein 6.0 - 8.5 g/dL 7.1 - -  Total Bilirubin 0.0 - 1.2 mg/dL 0.5 - -  Alkaline Phos 39 - 117 IU/L 120(H) - -  AST 0 - 40 IU/L 12 - -  ALT 0 - 44 IU/L 28 - -    No stool studies on file   Assessment: Encounter Diagnoses  Name Primary?  . Lower abdominal pain Yes  . Altered bowel habits     Years of chronic intermittent lower abdominal pain with altered bowel habits.  Sounds like prior endoscopic work-up, but no records from that available for review today.  Presumably rule out neoplasia and IBD.  We will make a request for records to the hospital where he believes these were done, he cannot recall the GI physician. It seems like this working diagnosis was IBS, and it does seem consistent with a functional disorder.  I wonder if it may be a diabetic neuropathy of sorts affecting the bowel. Dicyclomine is no longer effective for him, so I have switched him to hyoscyamine.  Some written dietary advice was also given.  We will have him follow-up when records are received and reviewed.  Thank you for the courtesy of this consult.  Please call me with any questions or concerns.  Nelida Meuse III  CC: Referring provider noted above  Addendum for record review 11/20/2019  Records from [facility] endoscopy dated 05/11/2015, Dr. Arvin Collard  Colonoscopy for generalized abdominal pain and diarrhea.  Terminal ileum normal.  Entire colon reportedly normal except for internal hemorrhoids.  Biopsies negative for microscopic colitis.  EGD with reflux esophagitis, mild gastric erythema.  Duodenal biopsy normal, stomach biopsy mild reactive gastropathy, no intestinal metaplasia or H. Pylori.  Madelon Lips, MD

## 2019-09-19 ENCOUNTER — Ambulatory Visit (INDEPENDENT_AMBULATORY_CARE_PROVIDER_SITE_OTHER): Payer: Medicare Other | Admitting: Podiatry

## 2019-09-19 ENCOUNTER — Other Ambulatory Visit: Payer: Self-pay

## 2019-09-19 DIAGNOSIS — L97511 Non-pressure chronic ulcer of other part of right foot limited to breakdown of skin: Secondary | ICD-10-CM

## 2019-09-24 MED FILL — PREGABALIN 75 MG CAPS: 75 | 30 days supply | Qty: 120 | Fill #1

## 2019-09-25 ENCOUNTER — Ambulatory Visit: Payer: Medicare Other | Admitting: Cardiovascular Disease

## 2019-09-25 ENCOUNTER — Telehealth: Payer: Self-pay

## 2019-09-25 ENCOUNTER — Other Ambulatory Visit: Payer: Self-pay | Admitting: Podiatry

## 2019-09-25 ENCOUNTER — Encounter: Payer: Self-pay | Admitting: Podiatry

## 2019-09-25 NOTE — Telephone Encounter (Signed)
Patient is requesting a letter for his apartment complex to move to a better accessible unit.   Patient sent a [employer].

## 2019-09-26 ENCOUNTER — Encounter: Payer: Self-pay | Admitting: Internal Medicine

## 2019-09-28 ENCOUNTER — Encounter: Payer: Self-pay | Admitting: Podiatry

## 2019-09-30 ENCOUNTER — Encounter: Payer: Self-pay | Admitting: Family Medicine

## 2019-09-30 NOTE — Progress Notes (Unsigned)
Patient ID: Austin Vaughan, male   DOB: 1970/10/01, 49 y.o.   MRN: [id number]   Patient left phone message requesting a letter so that he can obtain a more accessible apartment.

## 2019-10-01 ENCOUNTER — Other Ambulatory Visit: Payer: Self-pay | Admitting: Family Medicine

## 2019-10-01 DIAGNOSIS — E0842 Diabetes mellitus due to underlying condition with diabetic polyneuropathy: Secondary | ICD-10-CM

## 2019-10-01 MED FILL — $FARXIGA 10 MG TABLET: 10 | 30 days supply | Qty: 30 | Fill #6

## 2019-10-01 MED FILL — metFORMIN HCL 1000 MG TABS: 1000 | 30 days supply | Qty: 60 | Fill #8

## 2019-10-01 MED FILL — ATORVASTATIN 80 MG TABLET: 80 | 30 days supply | Qty: 30 | Fill #5

## 2019-10-01 MED FILL — RAMIPRIL 5 MG CAPS: 5 | 30 days supply | Qty: 60 | Fill #2

## 2019-10-01 NOTE — Telephone Encounter (Signed)
Please contact patient to see if he would like the letter mailed, faxed or if he will pick-up.

## 2019-10-01 NOTE — Telephone Encounter (Signed)
Contacted pt and made aware that letter is ready for pickup 

## 2019-10-02 ENCOUNTER — Other Ambulatory Visit: Payer: Self-pay | Admitting: Family Medicine

## 2019-10-02 DIAGNOSIS — E0842 Diabetes mellitus due to underlying condition with diabetic polyneuropathy: Secondary | ICD-10-CM

## 2019-10-02 MED FILL — $LANTUS SOLOSTAR 100 UNITS/: 100 | 26 days supply | Qty: 9 | Fill #0

## 2019-10-02 MED FILL — ?HUMALOG 100 UNITS/ML KWIKP: 100 | 25 days supply | Qty: 15 | Fill #5

## 2019-10-03 ENCOUNTER — Other Ambulatory Visit: Payer: Self-pay

## 2019-10-03 ENCOUNTER — Ambulatory Visit (INDEPENDENT_AMBULATORY_CARE_PROVIDER_SITE_OTHER): Payer: Medicare Other | Admitting: Podiatry

## 2019-10-03 VITALS — Temp 98.1°F

## 2019-10-03 DIAGNOSIS — L97511 Non-pressure chronic ulcer of other part of right foot limited to breakdown of skin: Secondary | ICD-10-CM | POA: Diagnosis not present

## 2019-10-03 MED ORDER — CEPHALEXIN 500 MG PO CAPS: 500.0000 mg | ORAL_CAPSULE | Freq: Two times a day (BID) | ORAL | 0 refills | Status: DC

## 2019-10-03 MED FILL — ?CEPHALEXIN 500MG CAPSULE: 500 | 7 days supply | Qty: 14 | Fill #0

## 2019-10-03 NOTE — Progress Notes (Signed)
  Subjective:  Patient ID: Austin Vaughan, male    DOB: [birth year],  MRN: [id number]  Chief Complaint  Patient presents with  . Wound Check    Pt stated, "I was seen by pain management. I received a Rx for hydrocodone-acetaminophen 5-325mg  1-2 tabs q6hr PRN. They have reduced my pain from 10/10 to 7/10. No fever/chills/N&V. I have not seen any pus".    49 y.o. male presents for wound care. Hx confirmed with patient. Objective:  Physical Exam: Wound Location: Right 2nd toe Wound Measurement: 2x1 Wound Base: Mixed Granular/Fibrotic Peri-wound: Normal Exudate: Scant/small amount Serosanguinous exudate wound without warmth, erythema, signs of acute infection  Assessment:   1. Ulcer of toe, right, limited to breakdown of skin [facility])    Plan:  Patient was evaluated and treated and all questions answered.  Ulcer Right 2nd toe -Improving. Continue Santyl WTD qOD -Continue surgical shoe -No debridement today -F/u in 2 weeks for recheck.  Return in about 2 weeks (around 10/17/2019).

## 2019-10-03 NOTE — Progress Notes (Deleted)
Cardiology Clinic Note   Patient Name: Austin Vaughan Date of Encounter: 10/03/2019  Primary Care Provider:  [name], MD Primary Cardiologist:  Skeet Latch, MD  Patient Profile    Austin Vaughan 49 year old male presents today for follow-up of his essential hypertension, coronary artery disease, palpitations, hyperlipidemia, and PSVT.  Past Medical History    Past Medical History:  Diagnosis Date  . Coronary artery disease   . Diabetes mellitus without complication ([place])   . Hypertension    Past Surgical History:  Procedure Laterality Date  . CORONARY ANGIOPLASTY WITH STENT PLACEMENT  07/2002    Allergies  Allergies  Allergen Reactions  . Ambien [Zolpidem Tartrate] Other (See Comments)    HALLUCINATIONS  . Shellfish Allergy Anaphylaxis    Seafood    History of Present Illness    Austin Vaughan has a past medical history of hypertension, diabetes, NSVT, and coronary artery disease with remote PCI while he lived in [place] (2013).  With his previous MI in 2013 he presented with bilateral arm and chest pain.  His PCI, vessel unknown, showed a LVEF of 47%(records not in epic).  Prior to moving to [place] he underwent a nuclear stress test on 04/2018 which showed no ischemia and an LVEF of 53%.  He moved from [place] to [place] to be closer to his daughter-in-law who had a baby.  During his initial appointment with Dr. Oval Linsey it was noted that he was having intermittent chest pain but was not using his prescribed nitroglycerin patch daily, daily use was recommended.  He saw Kerin Ransom, PA-C on 10/2018 with reports of palpitations.  He was given a 14-day Zio patch which showed short runs of PSVT.  His metoprolol was increased to 50 mg daily.  On follow-up with Fabian Sharp PA on 03/13/2019 he reported pleuritic type chest pain that was not consistent with ACS.  It was noted that he was suffering severe neuropathic pain.  On his follow-up on 04/24/2019 with Dr.  Oval Linsey he reported frequent palpitations.  He indicated that his symptoms had not improved since increasing his metoprolol.  He had right greater than left lower extremity edema that did not improve with elevating his extremity.  He reported intermittent periods of dyspnea with walking and with climbing stairs.  He stated he was barely eating but gaining weight and noted pain in his lower extremities.  He was mostly struggling with his neuropathic pain from peripheral neuropathy at that time.  He also indicated he was very concerned he may have ALS due to several of his family members having the disease including his mother.  He presented to the clinic 06/25/19 and stated he had not had chest pain or discomfort since his visit to the emergency department on 10/28.  He stated that he had chest pain that was not relieved by his nitroglycerin.  He stated he placed a nitroglycerin patch on his skin and proceeded to use 1 nitroglycerin under his tongue.  He described the pain as sudden and sharp over the left side of his chest.  The pain lasted for about 3 minutes and subsided.  He presented to the emergency department where ACS was ruled out.  His troponins were less than 2.  His did not show any ST elevation.  He has not had recurrence of chest pain or discomfort since the event.  He stated he was very sedentary at home.  When asked about his diet he stated he ate lots of  rice and Hispanic type foods.  However, he did try to avoid salt.  He presents today for follow-up and states***  He denies chest pain, shortness of breath, lower extremity edema, fatigue, melena, hematuria, hemoptysis, diaphoresis, weakness, presyncope, syncope, orthopnea, and PND.  Home Medications    Prior to Admission medications   Medication Sig Start Date End Date Taking? Authorizing Provider  albuterol (PROAIR HFA) 108 (90 Base) MCG/ACT inhaler Inhale 2 puffs into the lungs every 6 (six) hours as needed for wheezing or shortness  of breath. 12/17/18   Fulp, [name], MD  amLODipine (NORVASC) 10 MG tablet TAKE 1 TABLET (10 MG TOTAL) BY MOUTH DAILY. TO LOWER BLOOD PRESSURE Patient taking differently: Take 10 mg by mouth every evening. To lower blood pressure 01/27/19   Fulp, [name], MD  aspirin EC 81 MG tablet Take 81 mg by mouth daily.    [provider]  atorvastatin (LIPITOR) 80 MG tablet Take 1 tablet (80 mg total) by mouth daily. 04/24/19   Skeet Latch, MD  Blood Glucose Monitoring Suppl (TRUE METRIX METER) w/Device KIT Use daily to check blood sugar 4 times daily. 10/18/18   Shamleffer, Melanie Crazier, MD  collagenase (SANTYL) ointment Apply 1 application topically daily. 09/12/19   Evelina Bucy, DPM  Continuous Blood Gluc Receiver (FREESTYLE LIBRE 14 DAY READER) DEVI 1 Device by Does not apply route as directed. 09/03/19   Shamleffer, Melanie Crazier, MD  Continuous Blood Gluc Sensor (FREESTYLE LIBRE 14 DAY SENSOR) MISC 1 Device by Does not apply route as directed. 09/03/19   Shamleffer, Melanie Crazier, MD  dapagliflozin propanediol (FARXIGA) 10 MG TABS tablet Take 10 mg by mouth daily. 01/28/19   Shamleffer, Melanie Crazier, MD  dicyclomine (BENTYL) 20 MG tablet Take 1 tablet (20 mg total) by mouth 4 (four) times daily. As needed for abdominal cramping Patient taking differently: Take 20 mg by mouth 4 (four) times daily as needed (upsset stomach). As needed for abdominal cramping 08/22/18   Fulp, [name], MD  DULoxetine (CYMBALTA) 20 MG capsule Take 1 capsule (20 mg total) by mouth daily. 09/03/19   Shamleffer, Melanie Crazier, MD  gabapentin (NEURONTIN) 600 MG tablet Take 6 tablets (3,600 mg total) by mouth at bedtime. Medical recommendation is 2 pills, 3 times per day 10/02/19 11/01/19  [name], MD  glucose blood test strip 4x daily 10/18/18   Shamleffer, Melanie Crazier, MD  hyoscyamine (LEVSIN SL) 0.125 MG SL tablet Place 1 tablet (0.125 mg total) under the tongue every 6 (six) hours as needed. 09/17/19   Doran Stabler, MD  Insulin Glargine (LANTUS SOLOSTAR) 100 UNIT/ML Solostar Pen Inject 34 Units into the skin daily. Patient taking differently: Inject 40 Units into the skin daily.  02/03/19   Fulp, [name], MD  insulin lispro (HUMALOG KWIKPEN) 100 UNIT/ML KwikPen Inject 18 units into skin 3 times daily with ,eals. Max dosage 60 units daily. DX E11.65 Patient taking differently: Inject 18 Units into the skin 3 (three) times daily. Max dosage 60 units daily. 02/03/19   Fulp, [name], MD  metFORMIN (GLUCOPHAGE) 1000 MG tablet Take 1 tablet (1,000 mg total) by mouth 2 (two) times daily with a meal. 01/28/19   Shamleffer, Melanie Crazier, MD  metoprolol succinate (TOPROL-XL) 50 MG 24 hr tablet Take 1 and 1/2 tablets by mouth daily Patient taking differently: Take 75 mg by mouth daily. Take 1 and 1/2 tablets by mouth daily 04/24/19   Skeet Latch, MD  montelukast (SINGULAIR) 10 MG tablet Take 10  mg by mouth daily.    [provider]  nitroGLYCERIN (NITRODUR - DOSED IN MG/24 HR) 0.4 mg/hr patch Place 1 patch (0.4 mg total) onto the skin daily. 09/17/18   Erlene Quan, PA-C  nitroGLYCERIN (NITROSTAT) 0.4 MG SL tablet Place 0.4 mg under the tongue every 5 (five) minutes as needed for chest pain.    [provider]  nortriptyline (PAMELOR) 50 MG capsule TAKE 1 CAPSULE (50 MG TOTAL) BY MOUTH AT BEDTIME. 08/13/19   Fulp, [name], MD  pregabalin (LYRICA) 150 MG capsule Take 1 capsule (150 mg total) by mouth 2 (two) times daily. 08/28/19   Kirsteins, Luanna Salk, MD  ramipril (ALTACE) 5 MG capsule TAKE 2 CAPSULES (10 MG TOTAL) BY MOUTH DAILY. Patient taking differently: Take 10 mg by mouth daily.  07/28/19   Fulp, [name], MD  silver sulfADIAZINE (SILVADENE) 1 % cream Apply pea-sized amount to wound daily. 08/21/19   Evelina Bucy, DPM    Family History    Family History  Problem Relation Age of Onset  . ALS Mother   . Lung cancer Father   . ALS Maternal Aunt   . Heart disease Paternal  Grandmother   . Heart disease Paternal Grandfather    He indicated that his mother is deceased. He indicated that his father is deceased. He indicated that his sister is alive. He indicated that his brother is alive. He indicated that the status of his paternal grandmother is unknown. He indicated that the status of his paternal grandfather is unknown. He indicated that the status of his maternal aunt is unknown.  Social History    Social History   Socioeconomic History  . Marital status: Married    Spouse name: Not on file  . Number of children: 2  . Years of education: 73  . Highest education level: Not on file  Occupational History  . Occupation: disablitiy  Tobacco Use  . Smoking status: Never Smoker  . Smokeless tobacco: Never Used  Substance and Sexual Activity  . Alcohol use: Not Currently  . Drug use: Never  . Sexual activity: Yes  Other Topics Concern  . Not on file  Social History Narrative   Wife and patient relocated to [place] from [place] a few months ago. - 06/11/18   He is on disability since 2019.     He lives with wife and daughter in a one-level apartment.    Right handed   Social Determinants of Health   Financial Resource Strain:   . Difficulty of Paying Living Expenses: Not on file  Food Insecurity:   . Worried About Charity fundraiser in the Last Year: Not on file  . Ran Out of Food in the Last Year: Not on file  Transportation Needs:   . Lack of Transportation (Medical): Not on file  . Lack of Transportation (Non-Medical): Not on file  Physical Activity:   . Days of Exercise per Week: Not on file  . Minutes of Exercise per Session: Not on file  Stress:   . Feeling of Stress : Not on file  Social Connections:   . Frequency of Communication with Friends and Family: Not on file  . Frequency of Social Gatherings with Friends and Family: Not on file  . Attends Religious Services: Not on file  . Active Member of Clubs or Organizations: Not on  file  . Attends Archivist Meetings: Not on file  . Marital Status: Not on file  Intimate Partner  Violence:   . Fear of Current or Ex-Partner: Not on file  . Emotionally Abused: Not on file  . Physically Abused: Not on file  . Sexually Abused: Not on file     Review of Systems    General:  No chills, fever, night sweats or weight changes.  Cardiovascular:  No chest pain, dyspnea on exertion, edema, orthopnea, palpitations, paroxysmal nocturnal dyspnea. Dermatological: No rash, lesions/masses Respiratory: No cough, dyspnea Urologic: No hematuria, dysuria Abdominal:   No nausea, vomiting, diarrhea, bright red blood per rectum, melena, or hematemesis Neurologic:  No visual changes, wkns, changes in mental status. All other systems reviewed and are otherwise negative except as noted above.  Physical Exam    VS:  There were no vitals taken for this visit. , BMI There is no height or weight on file to calculate BMI. GEN: Well nourished, well developed, in no acute distress. HEENT: normal. Neck: Supple, no JVD, carotid bruits, or masses. Cardiac: RRR, no murmurs, rubs, or gallops. No clubbing, cyanosis, edema.  Radials/DP/PT 2+ and equal bilaterally.  Respiratory:  Respirations regular and unlabored, clear to auscultation bilaterally. GI: Soft, nontender, nondistended, BS + x 4. MS: no deformity or atrophy. Skin: warm and dry, no rash. Neuro:  Strength and sensation are intact. Psych: Normal affect.  Accessory Clinical Findings    ECG personally reviewed by me today- *** - No acute changes  EKG 06/25/2019 normal sinus rhythm 79 bpm- No acute changes  EKG 06/12/2019 Sinus tachycardia with occasional PVCs 115 bpm  Echocardiogram 05/05/2019 IMPRESSIONS   1. Left ventricular ejection fraction, by visual estimation, is 55 to 60%. The left ventricle has normal function. Normal left ventricular size. There is no left ventricular hypertrophy. 2. Left ventricular  diastolic Doppler parameters are consistent with impaired relaxation pattern of LV diastolic filling. 3. The aortic valve is tricuspid Aortic valve regurgitation was not visualized by color flow Doppler. Mild aortic valve sclerosis without stenosis. 4. Global right ventricle has normal systolic function.The right ventricular size is normal. No increase in right ventricular wall thickness. 5. Right atrial size was normal. 6. Left atrial size was normal. 7. The inferior vena cava is normal in size with greater than 50% respiratory variability, suggesting right atrial pressure of 3 mmHg. 8. The mitral valve is normal in structure. No evidence of mitral valve regurgitation. No evidence of mitral stenosis. 9. The tricuspid valve is normal in structure. Tricuspid valve regurgitation was not visualized by color flow Doppler. 10. TR signal is inadequate for assessing pulmonary artery systolic pressure.   Nuclear stress test 04/19/2018  No ischemia and an LVEF of 53%. Scanned in external report  Long-term cardiac monitor 10/01/2018 14-day ZIO Quality: Fair. Baseline artifact. Predominant rhythm: sinus rhythm Average heart rate: 90 bpm Max heart rate: 136 bpm Min heart rate: 54 bpm  Rare PACs and PVCs Short runs of SVT noted  Assessment & Plan   1.   Palpitations-14-day ZIO monitor from 09/2016 showed predominantly normal sinus rhythm with an average rate of 90 bpm.  It also showed rare PACs and PVCs with short runs of SVT. Continue metoprolol succinate 75 mg tablet daily  Coronary artery disease-no chest pain today.  Presented to the hospital on 10/28.  ACS was ruled out he has had no further episodes of chest pain. Continue aspirin 81 mg tablet daily Continue Norvasc 10 mg tablet daily Continue metoprolol succinate 75 mg tablet daily Continue ramipril 10 mg tablet daily Continue nitroglycerin 0.4 mg sublingual tablet as  needed Heart healthy low-sodium diet-salty 6  given Increase physical activity as tolerated   Hyperlipidemia-04/29/2019: Cholesterol, Total 121; HDL 43; LDL Chol Calc (NIH) 61; Triglycerides 86 Continue atorvastatin 80 mg tablet daily Heart healthy low-sodium high-fiber diet Increase physical activity as tolerated  Essential hypertension-BP today 124/86.  Well-controlled at home. Continue ramipril 10 mg tablet daily Continue Norvasc 10 mg tablet daily Continue metoprolol succinate 75 mg tablet daily Heart healthy low-sodium diet-salty 6 given Increase physical activity as tolerated  Chronic systolic diastolic heart failure/lower extremity edema- -echocardiogram 04/2019 showed LVEF 55 to 60% and impaired diastolic relaxation.Mild aortic valve sclerosis without stenosis. Heart healthy low-sodium diet-salty 6 given Elevate extremities throughout the day-extremities must be higher than heart.  Disposition: Follow-up with Dr. Oval Linsey in 6 months.   Jossie Ng. [facility] [address] Office [phone] Fax [phone]

## 2019-10-06 ENCOUNTER — Ambulatory Visit: Payer: Medicare Other | Admitting: General Practice

## 2019-10-09 ENCOUNTER — Encounter: Payer: Self-pay | Admitting: Family Medicine

## 2019-10-09 ENCOUNTER — Other Ambulatory Visit: Payer: Self-pay | Admitting: Family Medicine

## 2019-10-09 DIAGNOSIS — E0842 Diabetes mellitus due to underlying condition with diabetic polyneuropathy: Secondary | ICD-10-CM

## 2019-10-09 MED FILL — GABAPENTIN 600 MG TABLET: 600 | 30 days supply | Qty: 180 | Fill #0

## 2019-10-10 ENCOUNTER — Other Ambulatory Visit: Payer: Self-pay | Admitting: Family Medicine

## 2019-10-10 DIAGNOSIS — E0842 Diabetes mellitus due to underlying condition with diabetic polyneuropathy: Secondary | ICD-10-CM

## 2019-10-10 MED ORDER — GABAPENTIN 600 MG PO TABS: 3600.0000 mg | ORAL_TABLET | Freq: Every day | ORAL | 0 refills | Status: DC

## 2019-10-10 NOTE — Progress Notes (Signed)
Patient ID: Austin Vaughan, male   DOB: 05-Sep-1970, 49 y.o.   MRN: [id number]   Refill request received for gabapentin but per chart this was refilled 10/02/2019 but per patient he reports pharmacy unaware of refill. Will send new order

## 2019-10-12 NOTE — Progress Notes (Signed)
  Subjective:  Patient ID: Austin Vaughan, male    DOB: 08-25-1970,  MRN: [id number]  Chief Complaint  Patient presents with  . Wound Check    Pt states no concerns, some occasional pain, denies fever/nausea/vomiting/chills.    49 y.o. male presents for wound care. Hx confirmed with patient. Objective:  Physical Exam: Wound Location: Right 2nd toe Wound Measurement: 4x3 Wound Base: Mixed Granular/Fibrotic Peri-wound: Normal Exudate: Scant/small amount Serosanguinous exudate wound without warmth, erythema, signs of acute infection  Assessment:   1. Ulcer of toe, right, limited to breakdown of skin [facility])    Plan:  Patient was evaluated and treated and all questions answered.  Ulcer Right 2nd toe -Wound improving. -Dressed with betadine and well padded dressing -Rx santyl for enzymatic debridement  Return in about 2 weeks (around 10/03/2019) for Wound Care, Right.

## 2019-10-14 ENCOUNTER — Telehealth (INDEPENDENT_AMBULATORY_CARE_PROVIDER_SITE_OTHER): Payer: Medicare Other | Admitting: Cardiology

## 2019-10-14 ENCOUNTER — Other Ambulatory Visit: Payer: Self-pay | Admitting: Cardiology

## 2019-10-14 ENCOUNTER — Encounter: Payer: Self-pay | Admitting: Cardiology

## 2019-10-14 ENCOUNTER — Ambulatory Visit: Payer: Medicare Other | Admitting: General Practice

## 2019-10-14 VITALS — BP 108/65 | HR 93 | Ht 74.0 in | Wt 245.0 lb

## 2019-10-14 DIAGNOSIS — I2510 Atherosclerotic heart disease of native coronary artery without angina pectoris: Secondary | ICD-10-CM

## 2019-10-14 DIAGNOSIS — E1140 Type 2 diabetes mellitus with diabetic neuropathy, unspecified: Secondary | ICD-10-CM

## 2019-10-14 DIAGNOSIS — L97519 Non-pressure chronic ulcer of other part of right foot with unspecified severity: Secondary | ICD-10-CM

## 2019-10-14 DIAGNOSIS — E785 Hyperlipidemia, unspecified: Secondary | ICD-10-CM

## 2019-10-14 DIAGNOSIS — I10 Essential (primary) hypertension: Secondary | ICD-10-CM

## 2019-10-14 DIAGNOSIS — Z9861 Coronary angioplasty status: Secondary | ICD-10-CM

## 2019-10-14 DIAGNOSIS — E1142 Type 2 diabetes mellitus with diabetic polyneuropathy: Secondary | ICD-10-CM

## 2019-10-14 DIAGNOSIS — R002 Palpitations: Secondary | ICD-10-CM

## 2019-10-14 DIAGNOSIS — E11621 Type 2 diabetes mellitus with foot ulcer: Secondary | ICD-10-CM

## 2019-10-14 DIAGNOSIS — I471 Supraventricular tachycardia: Secondary | ICD-10-CM

## 2019-10-14 MED FILL — METOPROLOL SUCCINATE ER 50: 50 | 30 days supply | Qty: 45 | Fill #5

## 2019-10-14 NOTE — Progress Notes (Signed)
Virtual Visit via Telephone Note   This visit type was conducted due to national recommendations for restrictions regarding the COVID-19 Pandemic (e.g. social distancing) in an effort to limit this patient's exposure and mitigate transmission in our community.  Due to his co-morbid illnesses, this patient is at least at moderate risk for complications without adequate follow up.  This format is felt to be most appropriate for this patient at this time.  The patient did not have access to video technology/had technical difficulties with video requiring transitioning to audio format only (telephone).  All issues noted in this document were discussed and addressed.  No physical exam could be performed with this format.  Please refer to the patient's chart for his  consent to telehealth for [facility].   Date:  10/14/2019   ID:  Austin Vaughan, DOB 07-18-1971, MRN [id number]  Patient Location: Home Provider Location: Home  PCP:  [name], MD  Cardiologist:  Skeet Latch, MD  Electrophysiologist:  None   Evaluation Performed:  Follow-Up Visit  Chief Complaint:  occasional palpitations  History of Present Illness:    Austin Vaughan is a 49 y.o. male with a history of coronary disease, status post MI in 2013 treated with PCI, this was while he was living in [place].  The details are not known.  He moved to [place] in 2013 to be closer to his family.  He says had a stress test in September 2019 that was negative for ischemia.  Other medical issues include insulin-dependent diabetes with neuropathy, palpitations with PSVT documented on ZIO, treated dyslipidemia, and diabetic toe ulcer.  The patient had some palpitations in February 2020.  An outpatient monitor did document runs of PSVT and his beta-blocker was increased.  In September 2020 he had some issues with chest pain, echocardiogram showed normal LVF and diastolic impairment.    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  LE a Dopplers done 08/26/2019 showed mild decrease in the right ABI and normal left ABI.  It is felt he has small vessel diabetic disease.  He has severe diabetic neuropathy.  He has a family history of ALS and he went to [facility] for further evaluation, they felt his symptoms were secondary to diabetic neuropathy, not ALS.  Patient was contacted today for routine follow-up.  He says his toe wound is coming along slowly, he has weekly office visits.  He has occasional palpitations, maybe once a week, usually at rest.  These appear to be short-lived.  He is not had further issues with chest pain.  The patient does not have symptoms concerning for COVID-19 infection (fever, chills, cough, or new shortness of breath).    Past Medical History:  Diagnosis Date  . Coronary artery disease   . Diabetes mellitus without complication ([place])   . Hypertension    Past Surgical History:  Procedure Laterality Date  . CORONARY ANGIOPLASTY WITH STENT PLACEMENT  07/2002     Current Meds  Medication Sig  . albuterol (PROAIR HFA) 108 (90 Base) MCG/ACT inhaler Inhale 2 puffs into the lungs every 6 (six) hours as needed for wheezing or shortness of breath.  [name] amLODipine (NORVASC) 10 MG tablet TAKE 1 TABLET (10 MG TOTAL) BY MOUTH DAILY. TO LOWER BLOOD PRESSURE (Patient taking differently: Take 10 mg by mouth every evening. To lower blood pressure)  . aspirin EC 81 MG tablet Take 81 mg by mouth daily.  [name] atorvastatin (LIPITOR) 80 MG tablet  Take 1 tablet (80 mg total) by mouth daily.  . Blood Glucose Monitoring Suppl (TRUE METRIX METER) w/Device KIT Use daily to check blood sugar 4 times daily.  . cephALEXin (KEFLEX) 500 MG capsule Take 1 capsule (500 mg total) by mouth 2 (two) times daily.  . Continuous Blood Gluc Receiver (FREESTYLE LIBRE 14 DAY READER) DEVI 1 Device by Does not apply route as directed.  . Continuous Blood Gluc Sensor (FREESTYLE LIBRE 14 DAY SENSOR) MISC 1 Device by Does not apply route  as directed.  . dapagliflozin propanediol (FARXIGA) 10 MG TABS tablet Take 10 mg by mouth daily.  [name] gabapentin (NEURONTIN) 600 MG tablet Take 6 tablets (3,600 mg total) by mouth at bedtime. Medical recommendation is 2 pills, 3 times per day  . glucose blood test strip 4x daily  . HYDROcodone-acetaminophen (NORCO/VICODIN) 5-325 MG tablet Take 1-2 tablets by mouth every 6 (six) hours as needed for moderate pain.  . hyoscyamine (LEVSIN SL) 0.125 MG SL tablet Place 1 tablet (0.125 mg total) under the tongue every 6 (six) hours as needed.  . Insulin Glargine (LANTUS SOLOSTAR) 100 UNIT/ML Solostar Pen Inject 34 Units into the skin daily. (Patient taking differently: Inject 40 Units into the skin daily. )  . insulin lispro (HUMALOG KWIKPEN) 100 UNIT/ML KwikPen Inject 18 units into skin 3 times daily with ,eals. Max dosage 60 units daily. DX E11.65 (Patient taking differently: Inject 18 Units into the skin 3 (three) times daily. Max dosage 60 units daily.)  . metFORMIN (GLUCOPHAGE) 1000 MG tablet Take 1 tablet (1,000 mg total) by mouth 2 (two) times daily with a meal.  . metoprolol succinate (TOPROL-XL) 50 MG 24 hr tablet Take 1 and 1/2 tablets by mouth daily (Patient taking differently: Take 75 mg by mouth daily. Take 1 and 1/2 tablets by mouth daily)  . montelukast (SINGULAIR) 10 MG tablet Take 10 mg by mouth daily.  . nitroGLYCERIN (NITRODUR - DOSED IN MG/24 HR) 0.4 mg/hr patch Place 1 patch (0.4 mg total) onto the skin daily.  . nitroGLYCERIN (NITROSTAT) 0.4 MG SL tablet Place 0.4 mg under the tongue every 5 (five) minutes as needed for chest pain.  . nortriptyline (PAMELOR) 50 MG capsule TAKE 1 CAPSULE (50 MG TOTAL) BY MOUTH AT BEDTIME.  . pregabalin (LYRICA) 150 MG capsule Take 1 capsule (150 mg total) by mouth 2 (two) times daily.  . ramipril (ALTACE) 5 MG capsule TAKE 2 CAPSULES (10 MG TOTAL) BY MOUTH DAILY. (Patient taking differently: Take 10 mg by mouth daily. )  . silver sulfADIAZINE (SILVADENE)  1 % cream Apply pea-sized amount to wound daily.     Allergies:   Ambien [zolpidem tartrate] and Shellfish allergy   Social History   Tobacco Use  . Smoking status: Never Smoker  . Smokeless tobacco: Never Used  Substance Use Topics  . Alcohol use: Not Currently  . Drug use: Never     Family Hx: [name] family history includes ALS in his maternal aunt and mother; Heart disease in his paternal grandfather and paternal grandmother; Lung cancer in his father.  ROS:   Please see the history of present illness.    All other systems reviewed and are negative.   Prior CV studies:   The following studies were reviewed today: Myoview Sept 2020 Echo sept 2020 LEA dopplers Jan 2021  Labs/Other Tests and Data Reviewed:    EKG:  An ECG dated 06/27/2019 was personally reviewed today and demonstrated:  NSR- HR 79  Recent  Labs: 10/17/2018: TSH 0.96 09/05/2019: ALT 28; BUN 9; Creatinine, Ser 0.79; Hemoglobin 14.6; Platelets 362; Potassium 4.4; Sodium 141   Recent Lipid Panel Lab Results  Component Value Date/Time   CHOL 121 04/29/2019 11:43 AM   TRIG 86 04/29/2019 11:43 AM   HDL 43 04/29/2019 11:43 AM   CHOLHDL 2.8 04/29/2019 11:43 AM   LDLCALC 61 04/29/2019 11:43 AM    Wt Readings from Last 3 Encounters:  10/14/19 245 lb (111.1 kg)  09/17/19 247 lb (112 kg)  09/05/19 246 lb (111.6 kg)     Objective:    Vital Signs:  BP 108/65   Pulse 93   Ht 6' 2"  (1.88 m)   Wt 245 lb (111.1 kg)   SpO2 93%   BMI 31.46 kg/m    VITAL SIGNS:  reviewed  ASSESSMENT & PLAN:    Palpitations Fairly infrequent and short lived.  If these become more frequent consider changing Amlodipine to Diltiazem.  I also suggested he could try valsalva maneuver.   PSVT Runs of PSVT on OP monitor feb 2020  Insulin dependent diabetes mellitus with complications ([place]) Pt has severe neuropathy  Dyslipidemia, goal LDL below 70 On Lipitor 80 mg, LDL 61 Sept 2020  CAD S/P percutaneous coronary  angioplasty S/P MI- PCI of unknown vessel in Fl 2013 Low risk Myoview 2019 and normal LVF by echo Sept 2020  HTN- Controlled  Diabetic rt second toe ulcer- Being followed weekly. Gradual improvement.     COVID-19 Education: The signs and symptoms of COVID-19 were discussed with the patient and how to seek care for testing (follow up with PCP or arrange E-visit).  The importance of social distancing was discussed today.  Time:   Today, I have spent 10 minutes with the patient with telehealth technology discussing the above problems.     Medication Adjustments/Labs and Tests Ordered: Current medicines are reviewed at length with the patient today.  Concerns regarding medicines are outlined above.   Tests Ordered: No orders of the defined types were placed in this encounter.   Medication Changes: No orders of the defined types were placed in this encounter.   Follow Up:  In Person Dr Oval Linsey in 6 months  Signed, Kerin Ransom, PA-C  10/14/2019 1:53 PM    [place]

## 2019-10-14 NOTE — Patient Instructions (Signed)
Medication Instructions:  Your physician recommends that you continue on your current medications as directed. Please refer to the Current Medication list given to you today.  *If you need a refill on your cardiac medications before your next appointment, please call your pharmacy*  Lab Work: NONE ordered at this time of appointment   If you have labs (blood work) drawn today and your tests are completely normal, you will receive your results only by: . MyChart Message (if you have MyChart) OR . A paper copy in the mail If you have any lab test that is abnormal or we need to change your treatment, we will call you to review the results.  Testing/Procedures: NONE ordered at this time of appointment   Follow-Up: At [facility], you and your health needs are our priority.  As part of our continuing mission to provide you with exceptional heart care, we have created designated Provider Care Teams.  These Care Teams include your primary Cardiologist (physician) and Advanced Practice Providers (APPs -  Physician Assistants and Nurse Practitioners) who all work together to provide you with the care you need, when you need it.  We recommend signing up for the patient portal called "MyChart".  Sign up information is provided on this After Visit Summary.  MyChart is used to connect with patients for Virtual Visits (Telemedicine).  Patients are able to view lab/test results, encounter notes, upcoming appointments, etc.  Non-urgent messages can be sent to your provider as well.   To learn more about what you can do with MyChart, go to [email or web address].    Your next appointment:   6 month(s)  The format for your next appointment:   In Person  Provider:   Tiffany Nichols, MD  Other Instructions   

## 2019-10-15 MED FILL — NITROGLYCERIN 0.4 MG/HR PAT: 0.4 | 30 days supply | Qty: 30 | Fill #0

## 2019-10-16 ENCOUNTER — Other Ambulatory Visit: Payer: Self-pay

## 2019-10-16 ENCOUNTER — Ambulatory Visit (INDEPENDENT_AMBULATORY_CARE_PROVIDER_SITE_OTHER): Payer: Medicare Other | Admitting: Podiatry

## 2019-10-16 ENCOUNTER — Telehealth: Payer: Self-pay | Admitting: Internal Medicine

## 2019-10-16 DIAGNOSIS — L97511 Non-pressure chronic ulcer of other part of right foot limited to breakdown of skin: Secondary | ICD-10-CM

## 2019-10-16 NOTE — Telephone Encounter (Signed)
NA

## 2019-10-17 ENCOUNTER — Ambulatory Visit: Payer: Medicare Other | Admitting: Podiatry

## 2019-10-20 ENCOUNTER — Other Ambulatory Visit: Payer: Self-pay | Admitting: Internal Medicine

## 2019-10-20 ENCOUNTER — Other Ambulatory Visit: Payer: Self-pay | Admitting: Family Medicine

## 2019-10-20 MED ORDER — NITROGLYCERIN 0.4 MG/HR TD PT24: 0.4000 mg | MEDICATED_PATCH | Freq: Every day | TRANSDERMAL | 6 refills | Status: DC

## 2019-10-20 MED FILL — ATORVASTATIN 80 MG TABLET: 80 | 30 days supply | Qty: 30 | Fill #6

## 2019-10-20 MED FILL — LANTUS SOLOSTAR 100 UNITS/M: 100 | 26 days supply | Qty: 9 | Fill #1

## 2019-10-20 MED FILL — FARXIGA 10 MG TABLET: 10 | 30 days supply | Qty: 30 | Fill #0

## 2019-10-20 MED FILL — NORTRIPTYLINE HCL 50 MG CAP: 50 | 30 days supply | Qty: 30 | Fill #2

## 2019-10-20 MED FILL — metFORMIN HCL 1000 MG TABS: 1000 | 30 days supply | Qty: 60 | Fill #9

## 2019-10-20 MED FILL — HUMALOG 100 UNITS/ML KWIKPE: 100 | 25 days supply | Qty: 15 | Fill #6

## 2019-10-20 MED FILL — ALBUTEROL SULFATE HFA 108 (: 108 (90 BAS | 25 days supply | Qty: 18 | Fill #4

## 2019-10-21 MED FILL — RAMIPRIL 5 MG CAPS: 5 | 30 days supply | Qty: 60 | Fill #0

## 2019-10-24 ENCOUNTER — Telehealth: Payer: Self-pay | Admitting: Internal Medicine

## 2019-10-24 NOTE — Telephone Encounter (Signed)
Spoke to [place] with [facility] and she stated that per medicare guidelines notes have to say that pt is currently adjusting insulin and that pt is currently testing 4 times a day. She requested that you can create an addendum and that I can re-fax notes and that you can re-date CMN for the date that the addendum is created,please advise.

## 2019-10-24 NOTE — Telephone Encounter (Signed)
Returned call and lft a vm to return my call

## 2019-10-24 NOTE — Telephone Encounter (Signed)
[facility] is calling in regards to notes that they need clarification on for this patient. Their phone # is [phone] Zella Ball).

## 2019-10-27 NOTE — Telephone Encounter (Signed)
Addendum printed and faxed

## 2019-10-27 NOTE — Telephone Encounter (Signed)
Paperwork received from [place] states that per medicare guidelines OV must contain" pt is currently making self-adjustments to his insulin when needed or on a sliding scale.Please advise.

## 2019-10-29 ENCOUNTER — Encounter: Payer: Self-pay | Admitting: Internal Medicine

## 2019-10-29 ENCOUNTER — Encounter: Payer: Self-pay | Admitting: Family Medicine

## 2019-10-31 ENCOUNTER — Telehealth (INDEPENDENT_AMBULATORY_CARE_PROVIDER_SITE_OTHER): Payer: Medicare Other | Admitting: Neurology

## 2019-10-31 ENCOUNTER — Encounter: Payer: Self-pay | Admitting: Neurology

## 2019-10-31 ENCOUNTER — Telehealth: Payer: Self-pay | Admitting: Internal Medicine

## 2019-10-31 ENCOUNTER — Other Ambulatory Visit: Payer: Self-pay

## 2019-10-31 VITALS — Ht 74.0 in | Wt 240.0 lb

## 2019-10-31 DIAGNOSIS — G5623 Lesion of ulnar nerve, bilateral upper limbs: Secondary | ICD-10-CM

## 2019-10-31 DIAGNOSIS — E1142 Type 2 diabetes mellitus with diabetic polyneuropathy: Secondary | ICD-10-CM

## 2019-10-31 DIAGNOSIS — R278 Other lack of coordination: Secondary | ICD-10-CM | POA: Diagnosis not present

## 2019-10-31 DIAGNOSIS — Z7982 Long term (current) use of aspirin: Secondary | ICD-10-CM

## 2019-10-31 NOTE — Progress Notes (Signed)
   Virtual Visit via Video Note The purpose of this virtual visit is to provide medical care while limiting exposure to the novel coronavirus.    Consent was obtained for video visit:  Yes.   Answered questions that patient had about telehealth interaction:  Yes.   I discussed the limitations, risks, security and privacy concerns of performing an evaluation and management service by telemedicine. I also discussed with the patient that there may be a patient responsible charge related to this service. The patient expressed understanding and agreed to proceed.  Pt location: Home Physician Location: office Name of referring provider:  Cain Saupe, MD I connected with Austin Vaughan at patients initiation/request on 10/31/2019 at  2:30 PM EDT by video enabled telemedicine application and verified that I am speaking with the correct person using two identifiers. Pt MRN:  [id number] Pt DOB:  24-Nov-1970 Video Participants:  Austin Vaughan   History of Present Illness: This is a 49 y.o. male returning for follow-up of diabetic polyneuropathy.  He completed therapy for balance and left hand weakness since the last visit, but did not appreciate any significant improvement.  He uses a cane and furniture around his home for support.  He has grab bar in the bathroom.  He was hospitalized in January for right toe ulceration, which is healing and followed closely by podiatry.    He has diabetic painful neuropathy and sees Pain Management for pain relief.  He was on Lyrica and now on Nucynta, but does not seem to be tolerating it well.   Observations/Objective:   Vitals:   10/31/19 1027  Weight: 240 lb (108.9 kg)  Height: 6\' 2"  (1.88 m)   Patient is awake, alert, and appears comfortable.  Oriented x 4.   Face is symmetric.  Speech is not dysarthric.  Antigravity in all extremities. Marked atrophy of the intrinsic hand muscles bilaterally.  No pronator drift. Gait appears wide-based, unassisted and  stable .  Lab Results  Component Value Date   HGBA1C 7.1 (H) 08/23/2019    Assessment and Plan:  Diabetic polyneuropathy affecting a stocking-glove pattern with painful paresthesias, sensory ataxia, and weakness.  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  Fall precautions discussed, always use a cane.   Unfortunately, the neurological deficits from his neuropathy will be lasting deficits and goal is to minimize progression by optimal diabetes control.  His last HbA1c is 7.1, which is great and I encouraged him to continue to keep sugars controlled   Follow Up Instructions:   I discussed the assessment and treatment plan with the patient. The patient was provided an opportunity to ask questions and all were answered. The patient agreed with the plan and demonstrated an understanding of the instructions.   The patient was advised to call back or seek an in-person evaluation if the symptoms worsen or if the condition fails to improve as anticipated.  Follow-up in 1 year  10/21/2019, DO

## 2019-10-31 NOTE — Telephone Encounter (Signed)
[employer] called stating they did not have enough information on the paperwork that they received for this patient. They need an addendum to the progress notes stating that the patient is making self adjustments to his insulin and also an updated signature date on the CMN form.  Ph# [phone] (robin)

## 2019-11-03 NOTE — Telephone Encounter (Signed)
Called Robin back and left vm stating that I have attempted to fax requested information multiple times and each has failed. I will need an alternate fax number in order to send requested forms.

## 2019-11-06 ENCOUNTER — Ambulatory Visit (INDEPENDENT_AMBULATORY_CARE_PROVIDER_SITE_OTHER): Payer: Medicare Other | Admitting: Podiatry

## 2019-11-06 ENCOUNTER — Other Ambulatory Visit: Payer: Self-pay

## 2019-11-06 DIAGNOSIS — L97511 Non-pressure chronic ulcer of other part of right foot limited to breakdown of skin: Secondary | ICD-10-CM

## 2019-11-06 NOTE — Progress Notes (Signed)
  Subjective:  Patient ID: Austin Vaughan, male    DOB: 11-18-1970,  MRN: [id number]  Chief Complaint  Patient presents with  . Wound Check    Pt states he is healing well without any concerns, he is still applying Santyl. Pt would like to discuss preventing future wounds.    49 y.o. male presents for wound care. Hx confirmed with patient. Objective:  Physical Exam: Wound Location: Right 2nd toe Wound Measurement: healed Wound Base: healed Peri-wound: Normal Exudate: Scant/small amount Serosanguinous exudate wound without warmth, erythema, signs of acute infection Hammertoes bilat Protective sensation absent.  Assessment:   1. Ulcer of toe, right, limited to breakdown of skin [facility])    Plan:  Patient was evaluated and treated and all questions answered.  Ulcer Right 2nd toe -Healed. -Discussed benefits of DM shoes.  Return if symptoms worsen or fail to improve.

## 2019-11-12 ENCOUNTER — Encounter: Payer: Self-pay | Admitting: Family Medicine

## 2019-11-12 NOTE — Telephone Encounter (Signed)
Please ask what symptoms patient is having as I will need this information in order to place the Urology referral

## 2019-11-13 ENCOUNTER — Telehealth: Payer: Self-pay | Admitting: Internal Medicine

## 2019-11-13 ENCOUNTER — Other Ambulatory Visit: Payer: Self-pay

## 2019-11-13 MED ORDER — NOVOLOG FLEXPEN 100 UNIT/ML ~~LOC~~ SOPN: 18.0000 [IU] | PEN_INJECTOR | Freq: Three times a day (TID) | SUBCUTANEOUS | 11 refills | Status: DC

## 2019-11-13 MED FILL — FARXIGA 10 MG TABLET: 10 | 30 days supply | Qty: 30 | Fill #1

## 2019-11-13 MED FILL — metFORMIN HCL 1000 MG TABS: 1000 | 30 days supply | Qty: 60 | Fill #10

## 2019-11-13 MED FILL — METOPROLOL SUCCINATE ER 50: 50 | 30 days supply | Qty: 45 | Fill #6

## 2019-11-13 MED FILL — ATORVASTATIN 80 MG TABLET: 80 | 30 days supply | Qty: 30 | Fill #7

## 2019-11-13 MED FILL — LANTUS SOLOSTAR 100 UNITS/M: 100 | 26 days supply | Qty: 9 | Fill #2

## 2019-11-13 MED FILL — NOVOLOG FLEXPEN SYRINGE: 100 | 25 days supply | Qty: 15 | Fill #0

## 2019-11-13 MED FILL — VENTOLIN HFA 90 MCG INHALER: 108 (90 BAS | 25 days supply | Qty: 18 | Fill #5

## 2019-11-13 MED FILL — RAMIPRIL 5 MG CAPS: 5 | 30 days supply | Qty: 60 | Fill #1

## 2019-11-13 NOTE — Telephone Encounter (Signed)
Shayla from [facility] [place], [place] - [address] called re: Patient's insurance no longer covers Humalog. Shayla requests a RX for the following substitute:  MEDICATION: Novolog Flexpen  PHARMACY:  [facility] - [place], [place] - [address]  IS THIS A 90 DAY SUPPLY : That is up to Dr. Lonzo Cloud  IS PATIENT OUT OF MEDICATION: ?  IF NOT; HOW MUCH IS LEFT: ?  LAST APPOINTMENT DATE: @3 /19/2021  NEXT APPOINTMENT DATE:@Visit  date not found  DO WE HAVE YOUR PERMISSION TO LEAVE A DETAILED MESSAGE:?  OTHER COMMENTS:    **Let patient know to contact pharmacy at the end of the day to make sure medication is ready. **  ** Please notify patient to allow 48-72 hours to process**  **Encourage patient to contact the pharmacy for refills or they can request refills through [facility]**

## 2019-11-13 NOTE — Telephone Encounter (Signed)
Changed to novolog and sent to pharmacy.

## 2019-11-17 ENCOUNTER — Other Ambulatory Visit: Payer: Self-pay | Admitting: Internal Medicine

## 2019-11-17 MED ORDER — NOVOLOG FLEXPEN 100 UNIT/ML ~~LOC~~ SOPN: 18.0000 [IU] | PEN_INJECTOR | Freq: Three times a day (TID) | SUBCUTANEOUS | 11 refills | Status: DC

## 2019-11-19 NOTE — Telephone Encounter (Signed)
[employer] is calling requesting to speak to someone regarding the chart notes for this patient. [phone] Zella Ball)

## 2019-11-19 NOTE — Telephone Encounter (Signed)
Attempted to call Zella Ball back again and left another message stating that I need a working fax# because I have faxed the requested info multiple times and all failed according to confirmation pages.

## 2019-11-19 NOTE — Telephone Encounter (Signed)
Spoke to [place] and she stated that per medicare guidelines notes must state that pt is checking 4 times daily, injecting 3 times daily and also that pt is self adjusting but these points can not be in assessment and plan section of note. She would like for you to addend the OV from 08/2019 and have it re-faxed to [facility], please advise.

## 2019-11-20 ENCOUNTER — Other Ambulatory Visit: Payer: Medicare Other | Admitting: Orthotics

## 2019-11-20 ENCOUNTER — Encounter: Payer: Self-pay | Admitting: Family Medicine

## 2019-11-20 ENCOUNTER — Other Ambulatory Visit: Payer: Self-pay | Admitting: Family Medicine

## 2019-11-20 DIAGNOSIS — E1140 Type 2 diabetes mellitus with diabetic neuropathy, unspecified: Secondary | ICD-10-CM

## 2019-11-20 MED ORDER — NORTRIPTYLINE HCL 50 MG PO CAPS: 50.0000 mg | ORAL_CAPSULE | Freq: Every day | ORAL | 2 refills | Status: DC

## 2019-11-20 NOTE — Progress Notes (Signed)
Patient ID: Austin Vaughan, male   DOB: Dec 12, 1970, 49 y.o.   MRN: [id number]   Patient requested a refill of nortriptyline.

## 2019-11-21 ENCOUNTER — Other Ambulatory Visit: Payer: Self-pay | Admitting: Family Medicine

## 2019-11-21 DIAGNOSIS — E1140 Type 2 diabetes mellitus with diabetic neuropathy, unspecified: Secondary | ICD-10-CM

## 2019-11-21 MED FILL — NORTRIPTYLINE HCL 50 MG CAP: 50 | 90 days supply | Qty: 90 | Fill #0

## 2019-11-21 NOTE — Telephone Encounter (Signed)
Addendum and re-dated CMN page was re-faxed to [facility] @ [phone]

## 2019-11-21 NOTE — Telephone Encounter (Signed)
I made the addendum

## 2019-11-21 NOTE — Telephone Encounter (Signed)
You asked pt to f/u in 4 months at last visit (09/03/2019)so he is not due for a f/u yet. Would you like to bring him in sooner or create an addendum?

## 2019-12-04 ENCOUNTER — Encounter: Payer: Self-pay | Admitting: Family Medicine

## 2019-12-04 ENCOUNTER — Other Ambulatory Visit: Payer: Self-pay

## 2019-12-04 ENCOUNTER — Encounter: Payer: Medicare Other | Attending: Psychology | Admitting: Psychology

## 2019-12-04 ENCOUNTER — Encounter: Payer: Self-pay | Admitting: Psychology

## 2019-12-04 DIAGNOSIS — F0789 Other personality and behavioral disorders due to known physiological condition: Secondary | ICD-10-CM | POA: Diagnosis present

## 2019-12-04 DIAGNOSIS — E1140 Type 2 diabetes mellitus with diabetic neuropathy, unspecified: Secondary | ICD-10-CM | POA: Insufficient documentation

## 2019-12-04 DIAGNOSIS — IMO0002 Reserved for concepts with insufficient information to code with codable children: Secondary | ICD-10-CM

## 2019-12-04 DIAGNOSIS — F418 Other specified anxiety disorders: Secondary | ICD-10-CM | POA: Diagnosis present

## 2019-12-04 DIAGNOSIS — E1142 Type 2 diabetes mellitus with diabetic polyneuropathy: Secondary | ICD-10-CM | POA: Insufficient documentation

## 2019-12-04 DIAGNOSIS — G5793 Unspecified mononeuropathy of bilateral lower limbs: Secondary | ICD-10-CM | POA: Diagnosis present

## 2019-12-04 DIAGNOSIS — E1165 Type 2 diabetes mellitus with hyperglycemia: Secondary | ICD-10-CM | POA: Insufficient documentation

## 2019-12-04 DIAGNOSIS — F09 Unspecified mental disorder due to known physiological condition: Secondary | ICD-10-CM

## 2019-12-04 NOTE — Progress Notes (Signed)
Neuropsychological Consultation   Patient:   Austin Vaughan   DOB:   Jan 07, 1971  MR Number:  [id number]  Location:  [facility] FOR PAIN AND [facility] MEDICINE [facility] [place] [facility] PHYSICAL MEDICINE AND REHABILITATION [address] [place], [address] [id number] [facility] [place] [address] Dept: [phone]           Date of Service:   12/04/2019  Start Time:   10 AM End Time:   12 PM  Today's visit was an in person visit that was conducted in my outpatient clinic office with myself and the patient present.  1 hour and 15 minutes was spent in the direct clinical interview process and 45 minutes was spent in records review and report writing.  Provider/Observer:  Arley Phenix, Psy.D.       Clinical Neuropsychologist       Billing Code/Service: 96116/96121  Chief Complaint:    Austin Vaughan is a 49 year old male referred by his primary care physician Cain Saupe, MD due to ongoing issues and complications directly related to very painful neuropathy in his hands and his feet.  The patient reports that his issues really began to become quite problematic in 2013 when he had a heart attack and then all of his other problems started.  The patient has been followed sometime through [facility] medical center for significant and profound neuropathy in his lower legs and hands/arms.  The patient is also had a significant ulcer on his toe that has cleared up.  The patient is been dealing with underlying chronic issue with type 2 diabetes that at times has been very poorly controlled although the patient is doing much better and has had significant improvements in his A1c.  The patient was also seen in our office in the past by Dr. Larna Daughters for pain management issues regarding his neuropathy.  The patient is also been describing significant coping issues with his loss of function.  The patient has not been able to work.  The patient describes depression anxiety, being more moody and developing jealousy and  paranoid thoughts to some degree around his wife even though there is no objective indication of problems.  The patient also describes cognitive changes that have been developing including difficulty with memory and recall, changes in expressive speech and word finding/articulation as well as difficulties with writing.  The patient describes his primary difficulties as remembering/memory issues, walking, balance issues and some expressive speech issues.  Reason for Service:  Austin Vaughan is a 49 year old male referred by his primary care physician Cain Saupe, MD due to ongoing issues and complications directly related to very painful neuropathy in his hands and his feet.  The patient reports that his issues really began to become quite problematic in 2013 when he had a heart attack and then all of his other problems started.  The patient has been followed sometime through [facility] center for significant and profound neuropathy in his lower legs and hands/arms.  The patient is also had a significant ulcer on his toe that has cleared up.  The patient is been dealing with underlying chronic issue with type 2 diabetes that at times has been very poorly controlled although the patient is doing much better and has had significant improvements in his A1c.  The patient was also seen in our office in the past by Dr. Larna Daughters for pain management issues regarding his neuropathy.  The patient is also been describing significant coping issues with his loss of function.  The  patient has not been able to work.  The patient describes depression anxiety, being more moody and developing jealousy and paranoid thoughts to some degree around his wife even though there is no objective indication of problems.  The patient also describes cognitive changes that have been developing including difficulty with memory and recall, changes in expressive speech and word finding/articulation as well as difficulties with writing.  The  patient describes his primary difficulties as remembering/memory issues, walking, balance issues and some expressive speech issues.  The patient has had PT/OT therapy for balance and left hand weakness without significant improvement.  The patient uses a cane and furniture around his house for support.  He is being followed closely by podiatry around his feet and has had a recent significant ulcer that has improved.  The patient has been followed sometime by neurology due to significant pain and paresthesias in his hands and feet.  His diabetes was diagnosed in 2003 and is also been diagnosed with myocardial infarction.  The patient has been having progressive numbness, pins and needle sensations in his hands and feet.  Initially the symptoms were at bedtime but now have been persistent throughout the day.  He has had some motor changes and difficulty with grasping objects, balance difficulties, trouble sleeping secondary to his pain and low back pain but no radicular symptoms.  Various medications like gabapentin have been used to try to treat his sleep and he is also used trazodone at bedtime in the past for sleep.  There has been a family history of ALS affecting his mother and other family members but he has been evaluated for this and this is not felt to be the issue.  The patient does describe very significant disturbance in sleep and has been diagnosed with sleep disorder.  I saw reference in his medical records related to consideration of having a sleep study but it does not appear that the patient has had 1.   Reliability of Information: Information is derived from this one-hour clinical interview as well as review of available medical records.  Behavioral Observation: Austin Vaughan  presents as a 49 y.o.-year-old Right Hispanic Male who appeared his stated age. his dress was Appropriate and he was Well Groomed and his manners were Appropriate to the situation.  his participation was indicative of  Appropriate and Attentive behaviors.  There were physical disabilities noted related to pain and gait changes.  he displayed an appropriate level of cooperation and motivation.     Interactions:    Active Appropriate and Attentive  Attention:   within normal limits and attention span and concentration were age appropriate  Memory:   abnormal; remote memory intact, recent memory impaired  Visuo-spatial:  not examined  Speech (Volume):  normal  Speech:   normal; there was some mild word finding issues suggested during the clinical interview.  Thought Process:  Coherent and Relevant  Though Content:  WNL; not suicidal and not homicidal  Orientation:   person, place, time/date and situation  Judgment:   Good  Planning:   Good  Affect:    Anxious  Mood:    Anxious and Dysphoric  Insight:   Good  Intelligence:   normal  Marital Status/Living: The patient was born and raised in [place] and also spent considerable time in [place] and [place].  He has 2 siblings.  The patient continues to live with his wife and stepchild and they have been together for 6 years.  The patient  has been married several times in the past.  Current Employment: The patient is disabled because of this severe peripheral neuropathy and is not working.  Past Employment:  The patient worked as a Science writer in the past.  Presenter, broadcasting and interests include Network engineer, hats and other items and is active in church activities.  Substance Use:  No concerns of substance abuse are reported.    Education:   HS Graduate  Medical History:   Past Medical History:  Diagnosis Date  . Coronary artery disease   . Diabetes mellitus without complication (HCC)   . Hypertension    Abuse/Trauma History: The patient does not describe any history of significant abuse or trauma beyond dealing with the emotional impact of his heart attack and significant medical issues.  Psychiatric History:  The patient the  patient denies any prior psychiatric history before the development of his significant medical issues and heart attack.  He does describe significant depression and anxiety at this point.  Family Med/Psych History:  Family History  Problem Relation Age of Onset  . ALS Mother   . Lung cancer Father   . ALS Maternal Aunt   . Heart disease Paternal Grandmother   . Heart disease Paternal Grandfather     Risk of Suicide/Violence: virtually non-existent the patient denies any homicidal or suicidal ideation.  Impression/DX:  Austin Vaughan is a 49 year old male referred by his primary care physician Cain Saupe, MD due to ongoing issues and complications directly related to very painful neuropathy in his hands and his feet.  The patient reports that his issues really began to become quite problematic in 2013 when he had a heart attack and then all of his other problems started.  The patient has been followed sometime through [facility] medical center for significant and profound neuropathy in his lower legs and hands/arms.  The patient is also had a significant ulcer on his toe that has cleared up.  The patient is been dealing with underlying chronic issue with type 2 diabetes that at times has been very poorly controlled although the patient is doing much better and has had significant improvements in his A1c.  The patient was also seen in our office in the past by Dr. Larna Daughters for pain management issues regarding his neuropathy.  The patient is also been describing significant coping issues with his loss of function.  The patient has not been able to work.  The patient describes depression anxiety, being more moody and developing jealousy and paranoid thoughts to some degree around his wife even though there is no objective indication of problems.  The patient also describes cognitive changes that have been developing including difficulty with memory and recall, changes in expressive speech and word  finding/articulation as well as difficulties with writing.  The patient describes his primary difficulties as remembering/memory issues, walking, balance issues and some expressive speech issues.  Disposition/Plan:  We have set the patient up for therapeutic interventions.  I do not think that we really need any specific neuropsychological testing.  I do suspect that the patient is also dealing with some small vessel disease/microvascular ischemic changes.  While the patient could not give great detail it does sound like he had an MRI years ago when he was in [place] and the patient reports that he was told he had had mini strokes.  I suspect that this was probably more related to microvascular ischemic changes and his current cognitive symptoms that he describes would also be consistent with  some white matter involvement with microvascular ischemic changes that would also fit with his severe peripheral neuropathy.  The patient has been scheduled for 3 therapeutic visits initially and we will assess that he change to that once those visits begin.  I do think that the patient should probably have another brain MRI conducted due to the possibility of the underlying etiological factors for some of his cognitive difficulties that may go beyond simply related to his depression anxiety.  I suspect there are likely some underlying microvascular ischemic changes.  The patient is going to contact his primary care physician and/or his neurologist at [facility] to see if they think this would also be helpful and would order the study.  Diagnosis:    Cognitive and neurobehavioral dysfunction  Neuropathic pain of both feet  Uncontrolled diabetes mellitus with diabetic neuropathy (HCC)  Anxiety with depression  Diabetic polyneuropathy associated with type 2 diabetes mellitus (HCC)  Painful diabetic neuropathy (HCC)         Electronically Signed   _______________________ Arley Phenix, Psy.D.

## 2019-12-05 ENCOUNTER — Other Ambulatory Visit: Payer: Self-pay | Admitting: Family Medicine

## 2019-12-05 ENCOUNTER — Encounter: Payer: Self-pay | Admitting: Internal Medicine

## 2019-12-05 ENCOUNTER — Other Ambulatory Visit: Payer: Self-pay

## 2019-12-05 MED ORDER — FREESTYLE LIBRE 14 DAY SENSOR MISC: 1.0000 | 11 refills | Status: DC

## 2019-12-05 MED ORDER — FREESTYLE LIBRE 14 DAY READER DEVI: 1.0000 | 0 refills | Status: DC

## 2019-12-05 NOTE — Progress Notes (Signed)
Patient ID: Austin Vaughan, male   DOB: November 28, 1970, 49 y.o.   MRN: [id number]   Refill request was received from patient regarding Viagra but I will likely need to send a note to his cardiologist to see if he can take this medication as it appears he is on patch form of NTG (Nitodour).

## 2019-12-06 ENCOUNTER — Other Ambulatory Visit: Payer: Self-pay | Admitting: Family Medicine

## 2019-12-06 ENCOUNTER — Encounter: Payer: Self-pay | Admitting: Family Medicine

## 2019-12-06 DIAGNOSIS — E1140 Type 2 diabetes mellitus with diabetic neuropathy, unspecified: Secondary | ICD-10-CM

## 2019-12-06 DIAGNOSIS — E0842 Diabetes mellitus due to underlying condition with diabetic polyneuropathy: Secondary | ICD-10-CM

## 2019-12-06 DIAGNOSIS — N521 Erectile dysfunction due to diseases classified elsewhere: Secondary | ICD-10-CM

## 2019-12-06 DIAGNOSIS — E1169 Type 2 diabetes mellitus with other specified complication: Secondary | ICD-10-CM

## 2019-12-06 MED ORDER — PREGABALIN 300 MG PO CAPS: 300.0000 mg | ORAL_CAPSULE | Freq: Every day | ORAL | 3 refills | Status: DC

## 2019-12-06 MED ORDER — PREGABALIN 150 MG PO CAPS: 150.0000 mg | ORAL_CAPSULE | Freq: Two times a day (BID) | ORAL | 3 refills | Status: DC

## 2019-12-06 NOTE — Progress Notes (Signed)
Patient ID: Austin Vaughan, male   DOB: 1971/08/02, 49 y.o.   MRN: [id number]   Patient sent message through MyChart that he would [facility] but that [facility]ab[facility]entin can be sent to [facility].  To simplify patient's medication regimen, gabapentin has been discontinued and Lyrica will be prescribed at current 150 mg twice daily and instead of nighttime gabapentin which has previously been discussed with the patient should be a 3 times daily dose, patient has been taking 3600 mg of gabapentin at bedtime and in the past higher doses, will initiate 300 mg of Lyrica at bedtime in place of gabapentin.  I could find no supporting medical documentation for use of both Lyrica/pregabalin and gabapentin especially at the doses that patient has been utilizing.

## 2019-12-06 NOTE — Progress Notes (Signed)
Patient ID: Austin Vaughan, male   DOB: 07-Nov-1970, 49 y.o.   MRN: [id number]   MyChart message received that patient would like refill of gabapentin and Lyrica as he states that the pain medication prescribed at [facility] is not covered by his insurance.  It appears from medication list that he had been prescribed Nucynta ER 50 mg and Nucynta ER 150 mg both twice daily. On review of up-to-date, I could not find incidents in which both gabapentin and pregabalin/Lyrica were both used concurrently.  Pregabalin may be dosed up to 600 mg daily.  We will send in prescription for patient to take Lyrica 150 mg twice daily and 2 pills, 300 mg at bedtime.  As patient's insurance may limit Lyrica to 90 pills/month we will see if 300 mg pill is available for bedtime dosing.  Creatinine was normal at 0.79 on most recent comprehensive metabolic panel done 09/05/2019 by physical medicine and rehab and AST and ALT were also normal.

## 2019-12-06 NOTE — Progress Notes (Signed)
Patient ID: Austin Vaughan, male   DOB: Dec 14, 1970, 49 y.o.   MRN: [id number]   49 year old male with diabetes with peripheral neuropathy and coronary artery disease who needs treatment for erectile dysfunction.  Discussed with patient through MyChart that because he is on nitroglycerin patch as part of his therapy for coronary artery disease, he is not a candidate for Viagra use.  Urology referral placed and marked as urgent.

## 2019-12-08 ENCOUNTER — Other Ambulatory Visit: Payer: Self-pay

## 2019-12-08 DIAGNOSIS — R413 Other amnesia: Secondary | ICD-10-CM

## 2019-12-10 MED FILL — METOPROLOL SUCCINATE ER 50: 50 | 30 days supply | Qty: 45 | Fill #7

## 2019-12-10 MED FILL — NOVOLOG FLEXPEN SYRINGE: 100 | 25 days supply | Qty: 15 | Fill #1

## 2019-12-10 MED FILL — FARXIGA 10 MG TABLET: 10 | 30 days supply | Qty: 30 | Fill #2

## 2019-12-10 MED FILL — ATORVASTATIN 80 MG TABLET: 80 | 30 days supply | Qty: 30 | Fill #8

## 2019-12-10 MED FILL — RAMIPRIL 5 MG CAPS: 5 | 30 days supply | Qty: 60 | Fill #2

## 2019-12-10 MED FILL — metFORMIN HCL 1000 MG TABS: 1000 | 30 days supply | Qty: 60 | Fill #11

## 2019-12-10 MED FILL — LANTUS SOLOSTAR 100 UNITS/M: 100 | 26 days supply | Qty: 9 | Fill #3

## 2019-12-10 MED FILL — VENTOLIN HFA 90 MCG INHALER: 108 (90 BAS | 25 days supply | Qty: 18 | Fill #6

## 2019-12-25 ENCOUNTER — Encounter: Payer: Self-pay | Admitting: Family Medicine

## 2019-12-30 ENCOUNTER — Other Ambulatory Visit: Payer: Self-pay | Admitting: Family Medicine

## 2019-12-30 DIAGNOSIS — M5416 Radiculopathy, lumbar region: Secondary | ICD-10-CM

## 2019-12-30 DIAGNOSIS — E1140 Type 2 diabetes mellitus with diabetic neuropathy, unspecified: Secondary | ICD-10-CM

## 2019-12-30 DIAGNOSIS — G894 Chronic pain syndrome: Secondary | ICD-10-CM

## 2019-12-30 NOTE — Progress Notes (Signed)
Patient ID: Austin Vaughan, male   DOB: 1971-07-25, 49 y.o.   MRN: [id number]   Patient with continued chronic pain issues and would like referral to pain management other than [facility]

## 2020-01-04 ENCOUNTER — Encounter: Payer: Self-pay | Admitting: Family Medicine

## 2020-01-06 ENCOUNTER — Other Ambulatory Visit: Payer: Medicare Other

## 2020-01-10 ENCOUNTER — Other Ambulatory Visit: Payer: Medicare Other

## 2020-01-14 ENCOUNTER — Ambulatory Visit: Payer: Medicare Other | Admitting: Family Medicine

## 2020-01-14 MED FILL — LANTUS SOLOSTAR 100 UNITS/M: 100 | 26 days supply | Qty: 9 | Fill #4

## 2020-01-14 MED FILL — ATORVASTATIN 80 MG TABLET: 80 | 30 days supply | Qty: 30 | Fill #9

## 2020-01-14 MED FILL — NOVOLOG FLEXPEN SYRINGE: 100 | 25 days supply | Qty: 15 | Fill #2

## 2020-01-14 MED FILL — FARXIGA 10 MG TABLET: 10 | 30 days supply | Qty: 30 | Fill #3

## 2020-01-14 MED FILL — metFORMIN HCL 1000 MG TABS: 1000 | 30 days supply | Qty: 60 | Fill #0

## 2020-01-14 MED FILL — METOPROLOL SUCCINATE ER 50: 50 | 30 days supply | Qty: 45 | Fill #8

## 2020-01-19 ENCOUNTER — Other Ambulatory Visit: Payer: Self-pay | Admitting: Family Medicine

## 2020-01-19 DIAGNOSIS — I10 Essential (primary) hypertension: Secondary | ICD-10-CM

## 2020-01-19 MED ORDER — AMLODIPINE BESYLATE 10 MG PO TABS: 10.0000 mg | ORAL_TABLET | Freq: Every day | ORAL | 0 refills | Status: DC

## 2020-02-03 MED FILL — NOVOLOG FLEXPEN SYRINGE: 100 | 25 days supply | Qty: 15 | Fill #3

## 2020-02-04 ENCOUNTER — Ambulatory Visit: Payer: Medicare Other

## 2020-02-05 ENCOUNTER — Other Ambulatory Visit: Payer: Self-pay | Admitting: Family Medicine

## 2020-02-06 MED FILL — RAMIPRIL 5 MG CAPS: 5 | 30 days supply | Qty: 60 | Fill #0

## 2020-02-09 ENCOUNTER — Other Ambulatory Visit: Payer: Self-pay | Admitting: Family Medicine

## 2020-02-09 MED FILL — NORTRIPTYLINE HCL 50 MG CAP: 50 | 90 days supply | Qty: 90 | Fill #1

## 2020-02-10 ENCOUNTER — Other Ambulatory Visit: Payer: Self-pay

## 2020-02-10 ENCOUNTER — Telehealth: Payer: Self-pay

## 2020-02-10 ENCOUNTER — Ambulatory Visit
Admission: RE | Admit: 2020-02-10 | Discharge: 2020-02-10 | Disposition: A | Discharge status: Home / Self Care | Payer: Medicare Other | Source: Ambulatory Visit | Attending: Neurology | Admitting: Neurology

## 2020-02-10 DIAGNOSIS — R413 Other amnesia: Secondary | ICD-10-CM

## 2020-02-10 MED ORDER — NITROGLYCERIN 0.4 MG SL SUBL: 0.4000 mg | SUBLINGUAL_TABLET | SUBLINGUAL | 3 refills | Status: DC | PRN

## 2020-02-10 NOTE — Telephone Encounter (Signed)
Called pt and informed that MRI brain looked fine with no evidence of tumor, stroke or bleed, pt had no further questions.

## 2020-02-12 ENCOUNTER — Encounter: Payer: Self-pay | Admitting: Internal Medicine

## 2020-02-18 ENCOUNTER — Other Ambulatory Visit: Payer: Self-pay | Admitting: Internal Medicine

## 2020-02-18 ENCOUNTER — Encounter: Payer: Self-pay | Admitting: Internal Medicine

## 2020-02-18 DIAGNOSIS — E1142 Type 2 diabetes mellitus with diabetic polyneuropathy: Secondary | ICD-10-CM

## 2020-02-18 DIAGNOSIS — Z794 Long term (current) use of insulin: Secondary | ICD-10-CM

## 2020-02-18 MED FILL — NORTRIPTYLINE HCL 50 MG CAP: 50 | 90 days supply | Qty: 90 | Fill #1

## 2020-02-18 MED FILL — LANTUS SOLOSTAR 100 UNITS/M: 100 | 37 days supply | Qty: 15 | Fill #0

## 2020-02-20 MED FILL — FARXIGA 10 MG TABLET: 10 | 30 days supply | Qty: 30 | Fill #4

## 2020-02-20 MED FILL — ATORVASTATIN 80 MG TABLET: 80 | 30 days supply | Qty: 30 | Fill #10

## 2020-02-20 MED FILL — METOPROLOL SUCCINATE ER 50: 50 | 30 days supply | Qty: 45 | Fill #9

## 2020-02-22 NOTE — Progress Notes (Signed)
  Subjective:  Patient ID: Austin Vaughan, male    DOB: 1971-04-25,  MRN: [id number]  No chief complaint on file.   49 y.o. male presents for wound care. Hx confirmed with patient. Objective:  Physical Exam: Wound Location: Right 2nd toe Wound Measurement: 0.5x1 Wound Base: Mixed Granular/Fibrotic Peri-wound: Normal Exudate: Scant/small amount Serosanguinous exudate wound without warmth, erythema, signs of acute infection  Assessment:   1. Ulcer of toe, right, limited to breakdown of skin [facility])    Plan:  Patient was evaluated and treated and all questions answered.  Ulcer Right 2nd toe -Improving. Continue Santyl WTD qOD. Wound almost healed. -Continue surgical shoe -No debridement today -F/u in 2 weeks for recheck.  No follow-ups on file.

## 2020-03-01 ENCOUNTER — Other Ambulatory Visit: Payer: Self-pay | Admitting: Internal Medicine

## 2020-03-01 ENCOUNTER — Other Ambulatory Visit: Payer: Self-pay | Admitting: Family Medicine

## 2020-03-01 MED FILL — metFORMIN HCL 1000 MG TABS: 1000 | 30 days supply | Qty: 60 | Fill #0

## 2020-03-01 NOTE — Telephone Encounter (Signed)
Requested medication (s) are due for refill today: yes  Requested medication (s) are on the active medication list: yes  Last refill:  02/06/20  Future visit scheduled: no  Notes to clinic:  called pt to schedule appt for med refill- pt stated he did not want to see another provider other than Dr Jillyn Hidden- advised pt that he will needs to be reevaluated periodically and the other providers in the office would be happy to see him. Pt adamant about only wanting to see Dr Jillyn Hidden. Informed pt unsure at this time when she will be returning to office. Advised that prescription may not be renewed. Sending to office to advise.   Requested Prescriptions  Pending Prescriptions Disp Refills   ramipril (ALTACE) 5 MG capsule [Pharmacy Med Name: RAMIPRIL 5 MG CAPS 5 Capsule] 60 capsule 0    Sig: TAKE 2 CAPSULES (10 MG TOTAL) BY MOUTH DAILY.      Cardiovascular:  ACE Inhibitors Passed - 03/01/2020 11:56 AM      Passed - Cr in normal range and within 180 days    Creatinine, Ser  Date Value Ref Range Status  09/05/2019 0.79 0.76 - 1.27 mg/dL Final          Passed - K in normal range and within 180 days    Potassium  Date Value Ref Range Status  09/05/2019 4.4 3.5 - 5.2 mmol/L Final          Passed - Patient is not pregnant      Passed - Last BP in normal range    BP Readings from Last 1 Encounters:  10/14/19 108/65          Passed - Valid encounter within last 6 months    Recent Outpatient Visits           5 months ago Type 2 diabetes mellitus with hyperglycemia, with long-term current use of insulin (HCC)   [facility] F[place], [place], MD   11 months ago Left-sided chest wall pain   [place] [facility] [place], [place], MD   1 year ago Anxiety and depression   [facility] [place], [place], MD   1 year ago Type 2 diabetes mellitus with diabetic polyneuropathy, with long-term current use of insulin (HCC)   [place]  [facility] Fulp, [place], MD   1 year ago Type 2 diabetes mellitus with diabetic polyneuropathy, with long-term current use of insulin (HCC)   [place] [facility] [facility] [place], [place], MD

## 2020-03-02 ENCOUNTER — Other Ambulatory Visit: Payer: Self-pay

## 2020-03-02 MED ORDER — RAMIPRIL 5 MG PO CAPS: ORAL_CAPSULE | ORAL | 0 refills | Status: DC

## 2020-03-02 MED FILL — METOPROLOL SUCCINATE ER 50: 50 | 30 days supply | Qty: 45 | Fill #9

## 2020-03-02 MED FILL — ATORVASTATIN 80 MG TABLET: 80 | 30 days supply | Qty: 30 | Fill #10

## 2020-03-02 MED FILL — RAMIPRIL 5 MG CAPS: 5 | 90 days supply | Qty: 180 | Fill #0

## 2020-03-02 MED FILL — FARXIGA 10 MG TABLET: 10 | 30 days supply | Qty: 30 | Fill #4

## 2020-03-04 MED FILL — GABAPENTIN 300 MG CAPSULE: 300 | 30 days supply | Qty: 240 | Fill #0

## 2020-03-11 NOTE — Progress Notes (Deleted)
PATIENT IDENTIFIER: Austin Vaughan is a 49 y.o. male with a past medical history of T2DM, HTN, CAD. The patient has followed with Endocrinology clinic since 10/17/2018 for consultative assistance with management of his diabetes.  DIABETIC HISTORY:  Austin Vaughan was diagnosed with T2DM since 2013. He has tried Januvia in the past but was ineffective in controlling his hyperglycemia. He used to be on Metformin but unclear the reason for discontinuation.He has been on insulin ~ 2018. His hemoglobin A1c has ranged from  9.5% in 2020, peaking at 10.0% in 2019.   On his initial visit to our clinic his A1c was 9.5% . He was on an MDi regimen and Glipizide. We stopped Glipizide and started Metformin. By 01/2019 Austin Vaughan was started.   SUBJECTIVE:   During the last visit (09/03/2019): This was a virtual visit . We continued metformin , Farxiga,  lantus and novolog    Today (03/11/2020): Austin Vaughan is here for a 6 week virtual follow up on his diabetes management.  He checks his blood sugars 4 times daily, preprandial and bedtime. The patient has not had hypoglycemic episodes since the last clinic visit.    ROS: As per HPI and as detailed below: Review of Systems  Constitutional: Negative for fever.  HENT: Negative for congestion and sore throat.   Cardiovascular: Positive for leg swelling. Negative for chest pain and palpitations.  Gastrointestinal: Negative for diarrhea and nausea.      HOME DIABETES REGIMEN:   Lantus 40 units daily  HumaLog 18 units 3 times daily q. before meals  Metformin 1000 mg BID  Farxiga 10 mg daily       GLUCOSE LOG:  Date Breakfast  Lunch Supper Bedtime  09/02/2019 119 138 150 165  1/18 128 135 143 155      HISTORY:  Past Medical History:  Past Medical History:  Diagnosis Date  . Coronary artery disease   . Diabetes mellitus without complication ([place])   . Hypertension    Past Surgical History:  Past Surgical History:  Procedure Laterality Date    . CORONARY ANGIOPLASTY WITH STENT PLACEMENT  07/2002    Social History:  reports that he has never smoked. He has never used smokeless tobacco. He reports previous alcohol use. He reports that he does not use drugs. Family History:  Family History  Problem Relation Age of Onset  . ALS Mother   . Lung cancer Father   . ALS Maternal Aunt   . Heart disease Paternal Grandmother   . Heart disease Paternal Grandfather      HOME MEDICATIONS: Allergies as of 03/12/2020      Reactions   Ambien [zolpidem Tartrate] Other (See Comments)   HALLUCINATIONS   Shellfish Allergy Anaphylaxis   Seafood      Medication List       Accurate as of March 11, 2020 12:30 PM. If you have any questions, ask your nurse or doctor.        albuterol 108 (90 Base) MCG/ACT inhaler Commonly known as: ProAir HFA Inhale 2 puffs into the lungs every 6 (six) hours as needed for wheezing or shortness of breath.   amLODipine 10 MG tablet Commonly known as: NORVASC Take 1 tablet (10 mg total) by mouth daily. To lower blood pressure   aspirin EC 81 MG tablet Take 81 mg by mouth daily.   atorvastatin 80 MG tablet Commonly known as: LIPITOR Take 1 tablet (80 mg total) by mouth daily.  cephALEXin 500 MG capsule Commonly known as: KEFLEX Take 1 capsule (500 mg total) by mouth 2 (two) times daily.   Farxiga 10 MG Tabs tablet Generic drug: dapagliflozin propanediol TAKE 1 TABLET BY MOUTH DAILY.   FreeStyle Libre 14 Day Reader Kerrin Mo 1 Device by Does not apply route as directed.   FreeStyle Libre 14 Day Sensor Misc 1 Device by Does not apply route as directed.   glucose blood test strip 4x daily   hyoscyamine 0.125 MG SL tablet Commonly known as: LEVSIN SL Place 1 tablet (0.125 mg total) under the tongue every 6 (six) hours as needed.   Lantus SoloStar 100 UNIT/ML Solostar Pen Generic drug: insulin glargine Inject 40 Units into the skin daily.   Linzess 145 MCG Caps capsule Generic drug:  linaclotide   metFORMIN 1000 MG tablet Commonly known as: GLUCOPHAGE TAKE 1 TABLET (1,000 MG TOTAL) BY MOUTH 2 (TWO) TIMES DAILY WITH A MEAL.   metoprolol succinate 50 MG 24 hr tablet Commonly known as: TOPROL-XL Take 1 and 1/2 tablets by mouth daily What changed:   how much to take  how to take this  when to take this   montelukast 10 MG tablet Commonly known as: SINGULAIR Take 10 mg by mouth daily.   nitroGLYCERIN 0.4 mg/hr patch Commonly known as: NITRODUR - Dosed in mg/24 hr Place 1 patch (0.4 mg total) onto the skin daily.   nitroGLYCERIN 0.4 MG SL tablet Commonly known as: NITROSTAT Place 1 tablet (0.4 mg total) under the tongue every 5 (five) minutes as needed for chest pain.   nortriptyline 50 MG capsule Commonly known as: PAMELOR Take 1 capsule (50 mg total) by mouth at bedtime.   NovoLOG FlexPen 100 UNIT/ML FlexPen Generic drug: insulin aspart Inject 18 Units into the skin 3 (three) times daily with meals. Inject 18 units into skin 3 times daily with meals. Max dosage 60 units daily. DX E11.65   pregabalin 300 MG capsule Commonly known as: Lyrica Take 1 capsule (300 mg total) by mouth at bedtime.   pregabalin 150 MG capsule Commonly known as: LYRICA Take 1 capsule (150 mg total) by mouth 2 (two) times daily.   ramipril 5 MG capsule Commonly known as: ALTACE TAKE 2 CAPSULES (10 MG TOTAL) BY MOUTH DAILY.   silver sulfADIAZINE 1 % cream Commonly known as: Silvadene Apply pea-sized amount to wound daily.   True Metrix Meter w/Device Kit Use daily to check blood sugar 4 times daily.         DATA REVIEWED:  Lab Results  Component Value Date   HGBA1C 7.1 (H) 08/23/2019   HGBA1C 7.1 (H) 03/27/2019   HGBA1C 9.5 (A) 09/19/2018   Lab Results  Component Value Date   LDLCALC 61 04/29/2019   CREATININE 0.79 09/05/2019     Lab Results  Component Value Date   CHOL 121 04/29/2019   HDL 43 04/29/2019   LDLCALC 61 04/29/2019   TRIG 86 04/29/2019    CHOLHDL 2.8 04/29/2019         ASSESSMENT / PLAN / RECOMMENDATIONS:   1) Type 2 Diabetes Mellitus, Poorly controlled, With neuropathic and macrovascular complications - Most recent A1c of 7.1 %. Goal A1c < 7.0 %.    - I have praised him on improved glycemic control - No side effects to any medication at this time - Will continue current regimen  - Freestyle libre prescription was [facility] to [facility]  - Pt is able to make insulin adjustments based on MD  recommendations    MEDICATIONS:  Metformin 1000 mg BID  Farxiga 10 mg daily   Lantus 40 units daily   Novolog 18 units TID QAC  EDUCATION / INSTRUCTIONS:  BG monitoring instructions: Patient is instructed to check his blood sugars 4 times a day, before meals and bedtime.  Call [facility] if: BG persistently < 70 or > 300. . I reviewed the Rule of 15 for the treatment of hypoglycemia in detail with the patient. Literature supplied.  2.) Peripheral Neuropathy:   - Despite multiple referral  Though his PCP's office , he has not been able to get to them in over a year.  - I did express my concern about him being on Lyrica and gabapentin at the time same time, he will check with his PCP  On next visit  - He has not been tried on Cymbalta , I will start him on a small dose, will be cautious if an increase dose is needed as it may increase concentration of Nortriptyline.   Medication:  Cymbalta 20 mg daily     I discussed the assessment and treatment plan with the patient. The patient was provided an opportunity to ask questions and all were answered. The patient agreed with the plan and demonstrated an understanding of the instructions.    3) Right Toe ulcer:   - He complete an antibiotic course. He has an appointment with Dr. [name] to determine other options . He is concerned about the possibility of an amputation  F/U in 4 months     Signed electronically by: Mack Guise, MD  [facility]  Endocrinology  [facility] [address] [place], [place] [address] Phone: [phone] FAX: [phone]   CC: [name], MD [place] [address] Phone: [phone]  Fax: [phone]  Return to Endocrinology clinic as below: Future Appointments  Date Time Provider [place]  03/12/2020  2:20 PM Keshonda Monsour, Melanie Crazier, MD [facility] None  03/24/2020 10:00 AM Edgardo Roys, PsyD [facility] CPR  04/07/2020 10:00 AM Edgardo Roys, PsyD [facility] CPR  04/21/2020  2:40 PM Skeet Latch, MD [facility]  04/28/2020 10:00 AM Edgardo Roys, PsyD [facility] CPR  11/05/2020  1:30 PM Narda Amber K, DO [facility] None

## 2020-03-12 ENCOUNTER — Ambulatory Visit: Payer: Medicare Other | Admitting: Internal Medicine

## 2020-03-15 ENCOUNTER — Ambulatory Visit: Payer: Medicare Other | Admitting: Internal Medicine

## 2020-03-15 NOTE — Progress Notes (Deleted)
PATIENT IDENTIFIER: Austin Vaughan is a 49 y.o. male with a past medical history of T2DM, HTN, CAD. The patient has followed with Endocrinology clinic since 10/17/2018 for consultative assistance with management of his diabetes.  DIABETIC HISTORY:  Austin Vaughan was diagnosed with T2DM since 2013. He has tried Januvia in the past but was ineffective in controlling his hyperglycemia. He used to be on Metformin but unclear the reason for discontinuation.He has been on insulin ~ 2018. His hemoglobin A1c has ranged from  9.5% in 2020, peaking at 10.0% in 2019.   On his initial visit to our clinic his A1c was 9.5% . He was on an MDi regimen and Glipizide. We stopped Glipizide and started Metformin. By 01/2019 Austin Vaughan was started.   SUBJECTIVE:   During the last visit (09/03/2019): This was a virtual visit . We continued metformin , Farxiga,  lantus and novolog    Today (03/15/2020): Austin Vaughan is here for a 6 week virtual follow up on his diabetes management.  He checks his blood sugars 4 times daily, preprandial and bedtime. The patient has not had hypoglycemic episodes since the last clinic visit.    ROS: As per HPI and as detailed below: Review of Systems  Constitutional: Negative for fever.  HENT: Negative for congestion and sore throat.   Cardiovascular: Positive for leg swelling. Negative for chest pain and palpitations.  Gastrointestinal: Negative for diarrhea and nausea.      HOME DIABETES REGIMEN:   Lantus 40 units daily  HumaLog 18 units 3 times daily q. before meals  Metformin 1000 mg BID  Farxiga 10 mg daily       GLUCOSE LOG:  Date Breakfast  Lunch Supper Bedtime  09/02/2019 119 138 150 165  1/18 128 135 143 155      HISTORY:  Past Medical History:  Past Medical History:  Diagnosis Date  . Coronary artery disease   . Diabetes mellitus without complication ([place])   . Hypertension    Past Surgical History:  Past Surgical History:  Procedure Laterality Date    . CORONARY ANGIOPLASTY WITH STENT PLACEMENT  07/2002    Social History:  reports that he has never smoked. He has never used smokeless tobacco. He reports previous alcohol use. He reports that he does not use drugs. Family History:  Family History  Problem Relation Age of Onset  . ALS Mother   . Lung cancer Father   . ALS Maternal Aunt   . Heart disease Paternal Grandmother   . Heart disease Paternal Grandfather      HOME MEDICATIONS: Allergies as of 03/15/2020      Reactions   Ambien [zolpidem Tartrate] Other (See Comments)   HALLUCINATIONS   Shellfish Allergy Anaphylaxis   Seafood      Medication List       Accurate as of March 15, 2020  7:31 AM. If you have any questions, ask your nurse or doctor.        albuterol 108 (90 Base) MCG/ACT inhaler Commonly known as: ProAir HFA Inhale 2 puffs into the lungs every 6 (six) hours as needed for wheezing or shortness of breath.   amLODipine 10 MG tablet Commonly known as: NORVASC Take 1 tablet (10 mg total) by mouth daily. To lower blood pressure   aspirin EC 81 MG tablet Take 81 mg by mouth daily.   atorvastatin 80 MG tablet Commonly known as: LIPITOR Take 1 tablet (80 mg total) by mouth daily.  cephALEXin 500 MG capsule Commonly known as: KEFLEX Take 1 capsule (500 mg total) by mouth 2 (two) times daily.   Farxiga 10 MG Tabs tablet Generic drug: dapagliflozin propanediol TAKE 1 TABLET BY MOUTH DAILY.   FreeStyle Libre 14 Day Reader Kerrin Mo 1 Device by Does not apply route as directed.   FreeStyle Libre 14 Day Sensor Misc 1 Device by Does not apply route as directed.   glucose blood test strip 4x daily   hyoscyamine 0.125 MG SL tablet Commonly known as: LEVSIN SL Place 1 tablet (0.125 mg total) under the tongue every 6 (six) hours as needed.   Lantus SoloStar 100 UNIT/ML Solostar Pen Generic drug: insulin glargine Inject 40 Units into the skin daily.   Linzess 145 MCG Caps capsule Generic drug:  linaclotide   metFORMIN 1000 MG tablet Commonly known as: GLUCOPHAGE TAKE 1 TABLET (1,000 MG TOTAL) BY MOUTH 2 (TWO) TIMES DAILY WITH A MEAL.   metoprolol succinate 50 MG 24 hr tablet Commonly known as: TOPROL-XL Take 1 and 1/2 tablets by mouth daily What changed:   how much to take  how to take this  when to take this   montelukast 10 MG tablet Commonly known as: SINGULAIR Take 10 mg by mouth daily.   nitroGLYCERIN 0.4 mg/hr patch Commonly known as: NITRODUR - Dosed in mg/24 hr Place 1 patch (0.4 mg total) onto the skin daily.   nitroGLYCERIN 0.4 MG SL tablet Commonly known as: NITROSTAT Place 1 tablet (0.4 mg total) under the tongue every 5 (five) minutes as needed for chest pain.   nortriptyline 50 MG capsule Commonly known as: PAMELOR Take 1 capsule (50 mg total) by mouth at bedtime.   NovoLOG FlexPen 100 UNIT/ML FlexPen Generic drug: insulin aspart Inject 18 Units into the skin 3 (three) times daily with meals. Inject 18 units into skin 3 times daily with meals. Max dosage 60 units daily. DX E11.65   pregabalin 300 MG capsule Commonly known as: Lyrica Take 1 capsule (300 mg total) by mouth at bedtime.   pregabalin 150 MG capsule Commonly known as: LYRICA Take 1 capsule (150 mg total) by mouth 2 (two) times daily.   ramipril 5 MG capsule Commonly known as: ALTACE TAKE 2 CAPSULES (10 MG TOTAL) BY MOUTH DAILY.   silver sulfADIAZINE 1 % cream Commonly known as: Silvadene Apply pea-sized amount to wound daily.   True Metrix Meter w/Device Kit Use daily to check blood sugar 4 times daily.         DATA REVIEWED:  Lab Results  Component Value Date   HGBA1C 7.1 (H) 08/23/2019   HGBA1C 7.1 (H) 03/27/2019   HGBA1C 9.5 (A) 09/19/2018   Lab Results  Component Value Date   LDLCALC 61 04/29/2019   CREATININE 0.79 09/05/2019     Lab Results  Component Value Date   CHOL 121 04/29/2019   HDL 43 04/29/2019   LDLCALC 61 04/29/2019   TRIG 86 04/29/2019    CHOLHDL 2.8 04/29/2019         ASSESSMENT / PLAN / RECOMMENDATIONS:   1) Type 2 Diabetes Mellitus, Poorly controlled, With neuropathic and macrovascular complications - Most recent A1c of 7.1 %. Goal A1c < 7.0 %.    - I have praised him on improved glycemic control - No side effects to any medication at this time - Will continue current regimen  - Freestyle libre [facility]  - Pt is able to make insulin adjustments based on MD  recommendations    MEDICATIONS:  Metformin 1000 mg BID  Farxiga 10 mg daily   Lantus 40 units daily   Novolog 18 units TID QAC  EDUCATION / INSTRUCTIONS:  BG monitoring instructions: Patient is instructed to check his blood sugars 4 times a day, before meals and bedtime.  Call [place] [facility] if: BG persistently < 70 or > 300. . I reviewed the Rule of 15 for the treatment of hypoglycemia in detail with the patient. Literature supplied.  2.) Peripheral Neuropathy:   - Despite multiple referral  Though his PCP's office , he has not been able to get to them in over a year.  - I did express my concern about him being on Lyrica and gabapentin at the time same time, he will check with his PCP  On next visit  - He has not been tried on Cymbalta , I will start him on a small dose, will be cautious if an increase dose is needed as it may increase concentration of Nortriptyline.   Medication:  Cymbalta 20 mg daily     I discussed the assessment and treatment plan with the patient. The patient was provided an opportunity to ask questions and all were answered. The patient agreed with the plan and demonstrated an understanding of the instructions.    3) Right Toe ulcer:   - He complete an antibiotic course. He has an appointment with Dr. [name] to determine other options . He is concerned about the possibility of an amputation  F/U in 4 months     Signed electronically by: Mack Guise, MD  [facility]  Endocrinology  [facility] [address] [place], [place] [address] Phone: [phone] FAX: [phone]   CC: [name], MD [place] [address] Phone: [phone]  Fax: [phone]  Return to Endocrinology clinic as below: Future Appointments  Date Time Provider [place]  03/15/2020  8:30 AM Pauline Pegues, Melanie Crazier, MD [facility] None  03/24/2020 10:00 AM Edgardo Roys, PsyD [facility] CPR  04/07/2020 10:00 AM Edgardo Roys, PsyD [facility] CPR  04/21/2020  2:40 PM Skeet Latch, MD [facility]  04/28/2020 10:00 AM Edgardo Roys, PsyD [facility] CPR  11/05/2020  1:30 PM Narda Amber K, DO [facility] None

## 2020-03-23 ENCOUNTER — Other Ambulatory Visit: Payer: Self-pay | Admitting: Internal Medicine

## 2020-03-23 DIAGNOSIS — Z794 Long term (current) use of insulin: Secondary | ICD-10-CM

## 2020-03-23 DIAGNOSIS — E1142 Type 2 diabetes mellitus with diabetic polyneuropathy: Secondary | ICD-10-CM

## 2020-03-23 MED FILL — GABAPENTIN 300 MG CAPSULE: 300 | 15 days supply | Qty: 120 | Fill #1

## 2020-03-24 ENCOUNTER — Encounter: Payer: Medicare Other | Attending: Psychology | Admitting: Psychology

## 2020-04-06 ENCOUNTER — Other Ambulatory Visit: Payer: Self-pay | Admitting: Cardiology

## 2020-04-07 ENCOUNTER — Encounter: Payer: Self-pay | Admitting: Internal Medicine

## 2020-04-07 ENCOUNTER — Other Ambulatory Visit: Payer: Self-pay

## 2020-04-07 ENCOUNTER — Ambulatory Visit (INDEPENDENT_AMBULATORY_CARE_PROVIDER_SITE_OTHER): Payer: Medicare Other | Admitting: Internal Medicine

## 2020-04-07 ENCOUNTER — Encounter: Payer: Medicare Other | Admitting: Psychology

## 2020-04-07 VITALS — BP 122/80 | HR 89 | Ht 74.0 in | Wt 255.6 lb

## 2020-04-07 DIAGNOSIS — Z794 Long term (current) use of insulin: Secondary | ICD-10-CM | POA: Diagnosis not present

## 2020-04-07 DIAGNOSIS — E1142 Type 2 diabetes mellitus with diabetic polyneuropathy: Secondary | ICD-10-CM

## 2020-04-07 DIAGNOSIS — E1165 Type 2 diabetes mellitus with hyperglycemia: Secondary | ICD-10-CM

## 2020-04-07 LAB — POCT GLYCOSYLATED HEMOGLOBIN (HGB A1C): Hemoglobin A1C: 7.1 % — AB (ref 4.0–5.6)

## 2020-04-07 LAB — POCT GLUCOSE (DEVICE FOR HOME USE): POC Glucose: 165 mg/dl — AB (ref 70–99)

## 2020-04-07 MED ORDER — DAPAGLIFLOZIN PROPANEDIOL 10 MG PO TABS: 10.0000 mg | ORAL_TABLET | Freq: Every day | ORAL | 3 refills | Status: DC

## 2020-04-07 MED ORDER — DAPAGLIFLOZIN PROPANEDIOL 10 MG PO TABS
10.0000 mg | ORAL_TABLET | Freq: Every day | ORAL | 3 refills | Status: DC
Start: 2020-04-07 — End: 2020-10-21

## 2020-04-07 MED ORDER — METFORMIN HCL 1000 MG PO TABS: 1000.0000 mg | ORAL_TABLET | Freq: Two times a day (BID) | ORAL | 3 refills | Status: DC

## 2020-04-07 MED ORDER — LANTUS SOLOSTAR 100 UNIT/ML ~~LOC~~ SOPN: 36.0000 [IU] | PEN_INJECTOR | Freq: Every day | SUBCUTANEOUS | 3 refills | Status: DC

## 2020-04-07 MED ORDER — NOVOLOG FLEXPEN 100 UNIT/ML ~~LOC~~ SOPN
20.0000 [IU] | PEN_INJECTOR | Freq: Three times a day (TID) | SUBCUTANEOUS | 3 refills | Status: DC
Start: 2020-04-07 — End: 2020-04-13

## 2020-04-07 MED ORDER — INSULIN PEN NEEDLE 30G X 5 MM MISC: 1.0000 | 11 refills | Status: DC

## 2020-04-07 MED ORDER — METFORMIN HCL 1000 MG PO TABS
1000.0000 mg | ORAL_TABLET | Freq: Two times a day (BID) | ORAL | 3 refills | Status: DC
Start: 2020-04-07 — End: 2020-10-21

## 2020-04-07 MED ORDER — NOVOLOG FLEXPEN 100 UNIT/ML ~~LOC~~ SOPN: 20.0000 [IU] | PEN_INJECTOR | Freq: Three times a day (TID) | SUBCUTANEOUS | 3 refills | Status: DC

## 2020-04-07 MED FILL — metFORMIN HCL 1000 MG TABS: 1000 | 90 days supply | Qty: 180 | Fill #0

## 2020-04-07 MED FILL — LANTUS SOLOSTAR 100 UNITS/M: 100 | 83 days supply | Qty: 30 | Fill #0

## 2020-04-07 NOTE — Progress Notes (Signed)
     PATIENT IDENTIFIER: [name] is a 49 y.o. male with a past medical history of T2DM, HTN, CAD. The patient has followed with Endocrinology clinic since 10/17/2018 for consultative assistance with management of his diabetes.  DIABETIC HISTORY:  Mr. Eilts was diagnosed with T2DM since 2013. He has tried Januvia in the past but was ineffective in controlling his hyperglycemia. He used to be on Metformin but unclear the reason for discontinuation.He has been on insulin ~ 2018. His hemoglobin A1c has ranged from  9.5% in 2020, peaking at 10.0% in 2019.   On his initial visit to our clinic his A1c was 9.5% . He was on an MDi regimen and Glipizide. We stopped Glipizide and started Metformin. By 01/2019 Farxiga was started.   SUBJECTIVE:   During the last visit (09/03/2019): This was a virtual visit . We continued metformin , Farxiga,  lantus and novolog    Today (04/07/2020): Mr. Jessop is here for a follow up on his diabetes management.  He checks his blood sugars 4 times daily, preprandial and bedtime. The patient has  had hypoglycemic episodes since the last clinic visit, one time last month it was 57 mg/dL      HOME DIABETES REGIMEN:   Lantus 38 units daily  HumaLog 18 units 3 times daily q. before meals  Metformin 1000 mg BID  Farxiga 10 mg daily        HISTORY:  Past Medical History:  Past Medical History:  Diagnosis Date  . Coronary artery disease   . Diabetes mellitus without complication ([facility])   . Hypertension    Past Surgical History:  Past Surgical History:  Procedure Laterality Date  . CORONARY ANGIOPLASTY WITH STENT PLACEMENT  07/2002    Social History:  reports that he has never smoked. He has never used smokeless tobacco. He reports previous alcohol use. He reports that he does not use drugs. Family History:  Family History  Problem Relation Age of Onset  . ALS Mother   . Lung cancer Father   . ALS Maternal Aunt   . Heart disease Paternal  Grandmother   . Heart disease Paternal Grandfather      HOME MEDICATIONS: Allergies as of 04/07/2020      Reactions   Ambien [zolpidem Tartrate] Other (See Comments)   HALLUCINATIONS   Shellfish Allergy Anaphylaxis   Seafood      Medication List       Accurate as of April 07, 2020  1:34 PM. If you have any questions, ask your nurse or doctor.        albuterol 108 (90 Base) MCG/ACT inhaler Commonly known as: ProAir HFA Inhale 2 puffs into the lungs every 6 (six) hours as needed for wheezing or shortness of breath.   amLODipine 10 MG tablet Commonly known as: NORVASC Take 1 tablet (10 mg total) by mouth daily. To lower blood pressure   aspirin EC 81 MG tablet Take 81 mg by mouth daily.   atorvastatin 80 MG tablet Commonly known as: LIPITOR Take 1 tablet (80 mg total) by mouth daily.   cephALEXin 500 MG capsule Commonly known as: KEFLEX Take 1 capsule (500 mg total) by mouth 2 (two) times daily.   Farxiga 10 MG Tabs tablet Generic drug: dapagliflozin propanediol TAKE 1 TABLET BY MOUTH DAILY.   FreeStyle Libre 14 Day Reader Devi 1 Device by Does not apply route as directed.   FreeStyle Libre 14 Day Sensor Misc 1 Device by   Does not apply route as directed.   glucose blood test strip 4x daily   hyoscyamine 0.125 MG SL tablet Commonly known as: LEVSIN SL Place 1 tablet (0.125 mg total) under the tongue every 6 (six) hours as needed.   Lantus SoloStar 100 UNIT/ML Solostar Pen Generic drug: insulin glargine Inject 40 Units into the skin daily.   Linzess 145 MCG Caps capsule Generic drug: linaclotide   metFORMIN 1000 MG tablet Commonly known as: GLUCOPHAGE TAKE 1 TABLET (1,000 MG TOTAL) BY MOUTH 2 (TWO) TIMES DAILY WITH A MEAL.   metoprolol succinate 50 MG 24 hr tablet Commonly known as: TOPROL-XL Take 1 and 1/2 tablets by mouth daily What changed:   how much to take  how to take this  when to take this   montelukast 10 MG tablet Commonly known  as: SINGULAIR Take 10 mg by mouth daily.   nitroGLYCERIN 0.4 mg/hr patch Commonly known as: NITRODUR - Dosed in mg/24 hr Place 1 patch (0.4 mg total) onto the skin daily.   nitroGLYCERIN 0.4 MG SL tablet Commonly known as: NITROSTAT PLACE 1 TABLET (0.4 MG TOTAL) UNDER THE TONGUE EVERY 5 (FIVE) MINUTES AS NEEDED FOR CHEST PAIN.   nortriptyline 50 MG capsule Commonly known as: PAMELOR Take 1 capsule (50 mg total) by mouth at bedtime.   NovoLOG FlexPen 100 UNIT/ML FlexPen Generic drug: insulin aspart Inject 18 Units into the skin 3 (three) times daily with meals. Inject 18 units into skin 3 times daily with meals. Max dosage 60 units daily. DX E11.65   pregabalin 300 MG capsule Commonly known as: Lyrica Take 1 capsule (300 mg total) by mouth at bedtime.   pregabalin 150 MG capsule Commonly known as: LYRICA Take 1 capsule (150 mg total) by mouth 2 (two) times daily.   ramipril 5 MG capsule Commonly known as: ALTACE TAKE 2 CAPSULES (10 MG TOTAL) BY MOUTH DAILY.   silver sulfADIAZINE 1 % cream Commonly known as: Silvadene Apply pea-sized amount to wound daily.   True Metrix Meter w/Device Kit Use daily to check blood sugar 4 times daily.         DATA REVIEWED:  Lab Results  Component Value Date   HGBA1C 7.1 (H) 08/23/2019   HGBA1C 7.1 (H) 03/27/2019   HGBA1C 9.5 (A) 09/19/2018   Lab Results  Component Value Date   LDLCALC 61 04/29/2019   CREATININE 0.79 09/05/2019     Lab Results  Component Value Date   CHOL 121 04/29/2019   HDL 43 04/29/2019   LDLCALC 61 04/29/2019   TRIG 86 04/29/2019   CHOLHDL 2.8 04/29/2019       Results for KRISHANG, READING (MRN [id number]) as of 04/08/2020 13:10  Ref. Range 04/07/2020 15:46  Microalb, Ur Latest Units: mg/dL 0.4  MICROALB/CREAT RATIO Latest Ref Range: <30 mcg/mg creat 6  Creatinine, Urine Latest Ref Range: 20 - 320 mg/dL 69    ASSESSMENT / PLAN / RECOMMENDATIONS:   1) Type 2 Diabetes Mellitus, Poorly controlled,  With neuropathic and macrovascular complications - Most recent A1c of 7.1 %. Goal A1c < 7.0 %.    - I have praised him on improved glycemic control - No side effects to any medication at this time  - I have advised him to bring his CGM receiver on next visit    MEDICATIONS:  Metformin 1000 mg BID  Farxiga 10 mg daily   Lantus 36 units daily   Novolog 20 units TID QAC  EDUCATION / INSTRUCTIONS:  BG monitoring instructions: Patient is instructed to check his blood sugars 4 times a day, before meals and bedtime.  Call [place] [facility] clinic if: BG persistently < 70 . I reviewed the Rule of 15 for the treatment of hypoglycemia in detail with the patient. Literature supplied.    2) Diabetic complications:   Eye: Does not have known diabetic retinopathy.   Neuro/ Feet: Does have known diabetic peripheral neuropathy.  Renal: Patient does not have known baseline CKD. He is on an ACEI/ARB at present.Normal MA/cr ratio    Signed electronically by: Abby Jaralla Shamleffer, MD  [facility]  [facility] [address] [place], [place] [address] Phone: [phone] FAX: [phone]   CC: Fulp, [name], MD [address] [place] [address] Phone: [phone]  Fax: [phone]  Return to Endocrinology clinic as below: Future Appointments  Date Time Provider Department Center  04/07/2020  3:20 PM Shamleffer, Ibtehal Jaralla, MD [facility]  04/21/2020  2:40 PM La Alianza, Tiffany, MD [facility]  04/28/2020 10:00 AM Rodenbough, John R, PsyD [facility] CPR  11/05/2020  1:30 PM Patel, Donika K, DO [facility] None       

## 2020-04-07 NOTE — Patient Instructions (Signed)
-   Decrease Lantus to 36 units daily  - Increase Novolog to 20 units with each meal  - Continue Metformin 1000 mg, twice daily  - Continue Farxiga 10, 1 tablet daily with Breakfast      HOW TO TREAT LOW BLOOD SUGARS (Blood sugar LESS THAN 70 MG/DL)  Please follow the RULE OF 15 for the treatment of hypoglycemia treatment (when your (blood sugars are less than 70 mg/dL)    STEP 1: Take 15 grams of carbohydrates when your blood sugar is low, which includes:   3-4 GLUCOSE TABS  OR  3-4 OZ OF JUICE OR REGULAR SODA OR  ONE TUBE OF GLUCOSE GEL     STEP 2: RECHECK blood sugar in 15 MINUTES STEP 3: If your blood sugar is still low at the 15 minute recheck --> then, go back to STEP 1 and treat AGAIN with another 15 grams of carbohydrates.

## 2020-04-08 LAB — MICROALBUMIN / CREATININE URINE RATIO
Creatinine, Urine: 69 mg/dL (ref 20–320)
Microalb Creat Ratio: 6 mcg/mg creat (ref <30)
Microalb, Ur: 0.4 mg/dL

## 2020-04-08 MED FILL — ATORVASTATIN 80 MG TABLET: 80 | 30 days supply | Qty: 30 | Fill #11

## 2020-04-13 ENCOUNTER — Other Ambulatory Visit: Payer: Self-pay

## 2020-04-13 MED ORDER — INSULIN LISPRO (1 UNIT DIAL) 100 UNIT/ML (KWIKPEN)
PEN_INJECTOR | SUBCUTANEOUS | 1 refills | Status: DC
Start: 2020-04-13 — End: 2020-10-21

## 2020-04-21 ENCOUNTER — Ambulatory Visit (INDEPENDENT_AMBULATORY_CARE_PROVIDER_SITE_OTHER): Payer: Medicare Other | Admitting: Cardiovascular Disease

## 2020-04-21 ENCOUNTER — Other Ambulatory Visit: Payer: Self-pay

## 2020-04-21 ENCOUNTER — Encounter: Payer: Self-pay | Admitting: Cardiovascular Disease

## 2020-04-21 VITALS — BP 118/80 | HR 90 | Ht 74.0 in | Wt 246.0 lb

## 2020-04-21 DIAGNOSIS — I10 Essential (primary) hypertension: Secondary | ICD-10-CM | POA: Diagnosis not present

## 2020-04-21 DIAGNOSIS — R002 Palpitations: Secondary | ICD-10-CM

## 2020-04-21 DIAGNOSIS — Z9861 Coronary angioplasty status: Secondary | ICD-10-CM | POA: Diagnosis not present

## 2020-04-21 DIAGNOSIS — I2510 Atherosclerotic heart disease of native coronary artery without angina pectoris: Secondary | ICD-10-CM

## 2020-04-21 DIAGNOSIS — I739 Peripheral vascular disease, unspecified: Secondary | ICD-10-CM | POA: Diagnosis not present

## 2020-04-21 DIAGNOSIS — I471 Supraventricular tachycardia: Secondary | ICD-10-CM

## 2020-04-21 MED ORDER — METOPROLOL SUCCINATE ER 100 MG PO TB24
100.0000 mg | ORAL_TABLET | Freq: Every day | ORAL | 1 refills | Status: DC
Start: 2020-04-21 — End: 2020-08-31

## 2020-04-21 NOTE — Progress Notes (Signed)
Cardiology Office Note   Date:  04/21/2020   ID:  Austin Vaughan, DOB [birth year], MRN [id number]  PCP:  Cain Saupe, MD  Cardiologist:   Chilton Si, MD   No chief complaint on file.    History of Present Illness: Austin Vaughan is a 49 y.o. male with hypertension, diabetes, NSVT, and CAD s/p PCI here for follow up.  He was treated for an MI in 2013 while living in [place], [place].  He presented with bilateral arm and chest pain.  He underwent PCI in an unknown vessel and reports that his LVEF was 47%.  Prior to moving to [place] he had a nuclear stress 04/2028 that revealed no ischemia and LVEF 53%.  He moved to [place] to be closer to his daughter-in-law who recently had a baby.  At his initial appointment he complained of intermittent chest pain.  It was noted that he was not using his nitroglycerin patch daily.  It was recommended that he start to use it each day.  He followed up with Corine Shelter, PA-C on 10/2018 and reported palpitations.  He had a 14-day ZIO patch that showed short runs of PSVT.  Metoprolol was increased to 50 mg.  He followed up with Bettina Gavia, PA, on 7/30 and reported pleuritic chest pain that was inconsistent with ACS.  He was seen at [facility] and it was confirmed that he does not have ALS.  He has been suffering with severe neuropathic pain.  Austin Vaughan reports frequent palpitations.  His symptoms have not improved since increasing metoprolol.  He wore an ambulatory monitor that revealed rare PACs and PVCs and some short runs of SVT.  Metoprolol was increased to 75 mg daily.  He does not think this helped much.  At his last appointment he reported chest tightness and shortness of breath. He had an echocardiogram 04/2019 that revealed LVEF 55 to 60% with grade 1 diastolic dysfunction. There is aortic valve sclerosis without stenosis.  Metoprolol was also increased.  He had chronic limb ischemia and gangrene of Austin right foot.  ABIs were - 08/2019.  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  He followed with [facility] center and his wound healed completely with a topical cream.  Lately he has been feeling otherwise okay.  He continues to struggle with neuropathy.  He also gets some swelling in his ankles when he tries to walk for too long.  He has some chronic mild lower extremity edema that does not improve with elevation.   Past Medical History:  Diagnosis Date   Coronary artery disease    Diabetes mellitus without complication (HCC)    Hypertension     Past Surgical History:  Procedure Laterality Date   CORONARY ANGIOPLASTY WITH STENT PLACEMENT  07/2002     Current Outpatient Medications  Medication Sig Dispense Refill   albuterol (PROAIR HFA) 108 (90 Base) MCG/ACT inhaler Inhale 2 puffs into Austin lungs every 6 (six) hours as needed for wheezing or shortness of breath. 1 Inhaler 11   aspirin EC 81 MG tablet Take 81 mg by mouth daily.     atorvastatin (LIPITOR) 80 MG tablet Take 1 tablet (80 mg total) by mouth daily. 90 tablet 3   Continuous Blood Gluc Receiver (FREESTYLE LIBRE 14 DAY READER) DEVI 1 Device by Does not apply route as directed. 1 each 0   Continuous Blood Gluc Sensor (FREESTYLE LIBRE 14 DAY SENSOR) MISC 1 Device by Does not apply  route as directed. 2 each 11   dapagliflozin propanediol (FARXIGA) 10 MG TABS tablet Take 1 tablet (10 mg total) by mouth daily. 90 tablet 3   gabapentin (NEURONTIN) 300 MG capsule 2,100 mg at bedtime.      glucose blood test strip 4x daily 150 each 12   insulin glargine (LANTUS SOLOSTAR) 100 UNIT/ML Solostar Pen Inject 36 Units into Austin skin daily. 30 mL 3   insulin lispro (HUMALOG KWIKPEN) 100 UNIT/ML KwikPen Inject 20 Units into Austin skin 3 (three) times daily with meals. Max dosage 60 units daily. DX E11.65 60 mL 1   Insulin Pen Needle 30G X 5 MM MISC 1 Device by Does not apply route as directed. 150 each 11   metFORMIN (GLUCOPHAGE) 1000 MG tablet Take 1 tablet (1,000 mg  total) by mouth 2 (two) times daily with a meal. 180 tablet 3   metoprolol succinate (TOPROL-XL) 100 MG 24 hr tablet Take 1 tablet (100 mg total) by mouth daily. 90 tablet 1   montelukast (SINGULAIR) 10 MG tablet Take 10 mg by mouth daily.     nitroGLYCERIN (NITRODUR - DOSED IN MG/24 HR) 0.4 mg/hr patch Place 1 patch (0.4 mg total) onto Austin skin daily. 30 patch 6   nitroGLYCERIN (NITROSTAT) 0.4 MG SL tablet PLACE 1 TABLET (0.4 MG TOTAL) UNDER Austin TONGUE EVERY 5 (FIVE) MINUTES AS NEEDED FOR CHEST PAIN. 25 tablet 3   nortriptyline (PAMELOR) 50 MG capsule Take 1 capsule (50 mg total) by mouth at bedtime. 90 capsule 2   ramipril (ALTACE) 5 MG capsule TAKE 2 CAPSULES (10 MG TOTAL) BY MOUTH DAILY. 180 capsule 0   No current facility-administered medications for this visit.    Allergies:   Ambien [zolpidem tartrate] and Shellfish allergy    Social History:  Austin Vaughan  reports that he has never smoked. He has never used smokeless tobacco. He reports previous alcohol use. He reports that he does not use drugs.   Family History:  Austin Vaughan's family history includes ALS in his maternal aunt and mother; Heart disease in his paternal grandfather and paternal grandmother; Lung cancer in his father.    ROS:  Please see Austin history of present illness.   Otherwise, review of systems are positive for cold hands and feet.   All other systems are reviewed and negative.    PHYSICAL EXAM: VS:  BP 118/80    Pulse 90    Ht 6\' 2"  (1.88 m)    Wt 246 lb (111.6 kg)    SpO2 97%    BMI 31.58 kg/m  , BMI Body mass index is 31.58 kg/m. GENERAL:  Chronically ill-appearing HEENT:  Pupils equal round and reactive, fundi not visualized, oral mucosa unremarkable NECK:  No jugular venous distention, waveform within normal limits, carotid upstroke brisk and symmetric, no bruits LUNGS:  Clear to auscultation bilaterally HEART:  RRR.  PMI not displaced or sustained,S1 and S2 within normal limits, no S3, no S4, no  clicks, no rubs, no murmurs ABD:  Flat, positive bowel sounds normal in frequency in pitch, no bruits, no rebound, no guarding, no midline pulsatile mass, no hepatomegaly, no splenomegaly EXT:  2 plus pulses throughout, no edema, no cyanosis no clubbing SKIN:  No rashes no nodules NEURO:  Cranial nerves II through XII grossly intact, motor grossly intact throughout PSYCH:  Cognitively intact, oriented to person place and time  EKG:  EKG is ordered today. Austin ekg ordered 07/11/19 demonstrates sinus rhythm.  Rate 85 bpm.  04/24/19: Sinus rhythm.  Rate 83 bpm.   04/21/2020: Sinus rhythm.  Rate 90 beats minute.  Nuclear stress 04/18/18:  LVEF 53%.  No inducible ischemia.  Inferior defect consistent with diaphragmatic attenuation.  Echo 05/05/19: IMPRESSIONS   1. Left ventricular ejection fraction, by visual estimation, is 55 to  60%. Austin left ventricle has normal function. Normal left ventricular size.  There is no left ventricular hypertrophy.  2. Left ventricular diastolic Doppler parameters are consistent with  impaired relaxation pattern of LV diastolic filling.  3. Austin aortic valve is tricuspid Aortic valve regurgitation was not  visualized by color flow Doppler. Mild aortic valve sclerosis without  stenosis.  4. Global right ventricle has normal systolic function.Austin right  ventricular size is normal. No increase in right ventricular wall  thickness.  5. Right atrial size was normal.  6. Left atrial size was normal.  7. Austin inferior vena cava is normal in size with greater than 50%  respiratory variability, suggesting right atrial pressure of 3 mmHg.  8. Austin mitral valve is normal in structure. No evidence of mitral valve  regurgitation. No evidence of mitral stenosis.  9. Austin tricuspid valve is normal in structure. Tricuspid valve  regurgitation was not visualized by color flow Doppler.  10. TR signal is inadequate for assessing pulmonary artery systolic  pressure.    Recent Labs: 09/05/2019: ALT 28; BUN 9; Creatinine, Ser 0.79; Hemoglobin 14.6; Platelets 362; Potassium 4.4; Sodium 141    Lipid Panel    Component Value Date/Time   CHOL 121 04/29/2019 1143   TRIG 86 04/29/2019 1143   HDL 43 04/29/2019 1143   CHOLHDL 2.8 04/29/2019 1143   LDLCALC 61 04/29/2019 1143      Wt Readings from Last 3 Encounters:  04/21/20 246 lb (111.6 kg)  04/07/20 255 lb 9.6 oz (115.9 kg)  10/31/19 240 lb (108.9 kg)      ASSESSMENT AND PLAN:  # CAD s/p PCI: # Chronic angina: # Hyperlipidemia: Austin Vaughan reports recurrent angina.  He had a negative stress 04/2018.  He is stable and doing well.  We will increase metoprolol 200 mg due to his SVT and palpitations.  Continue aspirin and atorvastatin.  # SVT:  Check CMP, CBC, magnesium, and TSH.  Increase metoprolol 200 mg as above.  # Shortness of breath: # Lower extremity edema: # Chronic diastolic heart failure:   LVEF was previously reduced but was increased to 55 to 60% on his echo 04/2019.  He is euvolemic.  Continue metoprolol and ramipril.   # Claudication: Normal ABIs 07/2018.  This is neuropathic pain.  # Hypertension:  BP controlled on amlodipine, ramipril.  Increasing metoprolol as above.     Current medicines are reviewed at length with Austin Vaughan today.  Austin Vaughan does not have concerns regarding medicines.  Austin following changes have been made:  Increase metoprolol   Labs/ tests ordered today include:   Orders Placed This Encounter  Procedures   CBC with Differential/Platelet   T4, free   TSH   Magnesium   Comprehensive metabolic panel   EKG 12-Lead     Disposition:   FU with Verlisa Vara C. Duke Salvia, MD, [facility] in 6 months.    Signed, Tocarra Gassen C. Duke Salvia, MD, [facility]  04/21/2020 5:27 PM    [facility]

## 2020-04-21 NOTE — Patient Instructions (Signed)
Medication Instructions:  INCREASE YOUR METOPROLOL TO 100 MG TWICE A DAY  *If you need a refill on your cardiac medications before your next appointment, please call your pharmacy*  Lab Work: TSH/T4/MAGNESIUM/CMET/CBC TODAY   If you have labs (blood work) drawn today and your tests are completely normal, you will receive your results only by: [name] MyChart Message (if you have MyChart) OR . A paper copy in the mail If you have any lab test that is abnormal or we need to change your treatment, we will call you to review the results.  Testing/Procedures: NONE   Follow-Up: At [facility], you and your health needs are our priority.  As part of our continuing mission to provide you with exceptional heart care, we have created designated Provider Care Teams.  These Care Teams include your primary Cardiologist (physician) and Advanced Practice Providers (APPs -  Physician Assistants and Nurse Practitioners) who all work together to provide you with the care you need, when you need it.  We recommend signing up for the patient portal called "MyChart".  Sign up information is provided on this After Visit Summary.  MyChart is used to connect with patients for Virtual Visits (Telemedicine).  Patients are able to view lab/test results, encounter notes, upcoming appointments, etc.  Non-urgent messages can be sent to your provider as well.   To learn more about what you can do with MyChart, go to [email or web address].    Your next appointment:   6 month(s)  The format for your next appointment:   In Person  Provider:   You will see one of the following Advanced Practice Providers on your designated Care Team:    Corine Shelter, PA-C  [place], [place]  Edd Fabian, [place]  Your physician wants you to follow-up in: 12 months with DR [facility]  You will receive a reminder letter in the mail two months in advance. If you don't receive a letter, please call our office to schedule the follow-up  appointment.

## 2020-04-22 ENCOUNTER — Other Ambulatory Visit: Payer: Self-pay | Admitting: Cardiology

## 2020-04-22 LAB — CBC WITH DIFFERENTIAL/PLATELET
Basophils Absolute: 0.1 10*3/uL (ref 0.0–0.2)
Basos: 1 %
EOS (ABSOLUTE): 0.2 10*3/uL (ref 0.0–0.4)
Eos: 2 %
Hematocrit: 45.2 % (ref 37.5–51.0)
Hemoglobin: 14.9 g/dL (ref 13.0–17.7)
Immature Grans (Abs): 0 10*3/uL (ref 0.0–0.1)
Immature Granulocytes: 0 %
Lymphocytes Absolute: 2.3 10*3/uL (ref 0.7–3.1)
Lymphs: 27 %
MCH: 27.3 pg (ref 26.6–33.0)
MCHC: 33 g/dL (ref 31.5–35.7)
MCV: 83 fL (ref 79–97)
Monocytes Absolute: 0.7 10*3/uL (ref 0.1–0.9)
Monocytes: 8 %
Neutrophils Absolute: 5.4 10*3/uL (ref 1.4–7.0)
Neutrophils: 62 %
Platelets: 302 10*3/uL (ref 150–450)
RBC: 5.45 x10E6/uL (ref 4.14–5.80)
RDW: 11.9 % (ref 11.6–15.4)
WBC: 8.7 10*3/uL (ref 3.4–10.8)

## 2020-04-22 LAB — COMPREHENSIVE METABOLIC PANEL
ALT: 24 IU/L (ref 0–44)
AST: 13 IU/L (ref 0–40)
Albumin/Globulin Ratio: 1.8 (ref 1.2–2.2)
Albumin: 4.6 g/dL (ref 3.8–4.9)
Alkaline Phosphatase: 132 IU/L — ABNORMAL HIGH (ref 48–121)
BUN/Creatinine Ratio: 12 (ref 9–20)
BUN: 9 mg/dL (ref 6–24)
Bilirubin Total: 0.7 mg/dL (ref 0.0–1.2)
CO2: 24 mmol/L (ref 20–29)
Calcium: 9.9 mg/dL (ref 8.7–10.2)
Chloride: 105 mmol/L (ref 96–106)
Creatinine, Ser: 0.76 mg/dL (ref 0.76–1.27)
GFR calc Af Amer: 122 mL/min/{1.73_m2} (ref >59)
GFR calc non Af Amer: 106 mL/min/{1.73_m2} (ref >59)
Globulin, Total: 2.6 g/dL (ref 1.5–4.5)
Glucose: 158 mg/dL — ABNORMAL HIGH (ref 65–99)
Potassium: 4.6 mmol/L (ref 3.5–5.2)
Sodium: 146 mmol/L — ABNORMAL HIGH (ref 134–144)
Total Protein: 7.2 g/dL (ref 6.0–8.5)

## 2020-04-22 LAB — TSH: TSH: 1.57 u[IU]/mL (ref 0.450–4.500)

## 2020-04-22 LAB — T4, FREE: Free T4: 1.36 ng/dL (ref 0.82–1.77)

## 2020-04-22 LAB — MAGNESIUM: Magnesium: 2 mg/dL (ref 1.6–2.3)

## 2020-04-23 ENCOUNTER — Ambulatory Visit: Payer: Medicare Other | Admitting: Family Medicine

## 2020-04-27 DIAGNOSIS — M79676 Pain in unspecified toe(s): Secondary | ICD-10-CM

## 2020-04-28 ENCOUNTER — Encounter: Payer: Medicare Other | Attending: Psychology | Admitting: Psychology

## 2020-05-04 ENCOUNTER — Encounter: Payer: Self-pay | Admitting: Podiatry

## 2020-05-12 ENCOUNTER — Other Ambulatory Visit: Payer: Self-pay | Admitting: Cardiovascular Disease

## 2020-05-13 MED ORDER — RAMIPRIL 5 MG PO CAPS: ORAL_CAPSULE | ORAL | 3 refills | Status: DC

## 2020-05-13 MED ORDER — ATORVASTATIN CALCIUM 80 MG PO TABS
80.0000 mg | ORAL_TABLET | Freq: Every day | ORAL | 3 refills | Status: DC
Start: 2020-05-13 — End: 2020-10-21

## 2020-05-13 MED FILL — NORTRIPTYLINE HCL 50 MG CAP: 50 | 90 days supply | Qty: 90 | Fill #2

## 2020-05-13 MED FILL — ATORVASTATIN CALCIUM 80 MG: 80 | 30 days supply | Qty: 30 | Fill #0

## 2020-05-20 MED FILL — ATORVASTATIN CALCIUM 80 MG: 80 | 30 days supply | Qty: 30 | Fill #0

## 2020-05-20 MED FILL — NORTRIPTYLINE HCL 50 MG CAP: 50 | 90 days supply | Qty: 90 | Fill #2

## 2020-07-15 ENCOUNTER — Encounter: Payer: Self-pay | Admitting: Podiatry

## 2020-08-04 ENCOUNTER — Telehealth: Payer: Self-pay | Admitting: Family Medicine

## 2020-08-04 NOTE — Telephone Encounter (Signed)
Patient called to schedule a welcome to medicare visit.  He is a former patient of [name] Fulp.  Please advise and call patient to schedule [phone]

## 2020-08-17 ENCOUNTER — Encounter: Payer: Self-pay | Admitting: Internal Medicine

## 2020-08-18 ENCOUNTER — Other Ambulatory Visit: Payer: Self-pay

## 2020-08-18 DIAGNOSIS — R1030 Lower abdominal pain, unspecified: Secondary | ICD-10-CM

## 2020-08-18 MED ORDER — DICYCLOMINE HCL 20 MG PO TABS: 20.0000 mg | ORAL_TABLET | Freq: Four times a day (QID) | ORAL | 0 refills | Status: DC | PRN

## 2020-08-18 NOTE — Telephone Encounter (Signed)
Yes the dicyclomine can be refilled.  He also needs an appointment to see me by the end of February because it has been about a year since his visit here.  - HD

## 2020-08-31 ENCOUNTER — Other Ambulatory Visit: Payer: Self-pay | Admitting: Cardiovascular Disease

## 2020-08-31 ENCOUNTER — Other Ambulatory Visit: Payer: Self-pay | Admitting: Cardiology

## 2020-08-31 ENCOUNTER — Other Ambulatory Visit: Payer: Self-pay | Admitting: Gastroenterology

## 2020-08-31 DIAGNOSIS — E1140 Type 2 diabetes mellitus with diabetic neuropathy, unspecified: Secondary | ICD-10-CM

## 2020-08-31 DIAGNOSIS — R1030 Lower abdominal pain, unspecified: Secondary | ICD-10-CM

## 2020-08-31 DIAGNOSIS — R0600 Dyspnea, unspecified: Secondary | ICD-10-CM

## 2020-08-31 MED ORDER — ALBUTEROL SULFATE HFA 108 (90 BASE) MCG/ACT IN AERS: 2.0000 | INHALATION_SPRAY | Freq: Four times a day (QID) | RESPIRATORY_TRACT | 11 refills | Status: DC | PRN

## 2020-08-31 MED ORDER — NORTRIPTYLINE HCL 50 MG PO CAPS: 50.0000 mg | ORAL_CAPSULE | Freq: Every day | ORAL | 2 refills | Status: DC

## 2020-09-08 NOTE — Telephone Encounter (Signed)
Pt complaining of arm pain , does not sound urgent.  Please make appt with any provider, mobile team OK Ms Mayers for his arm pain .

## 2020-09-13 ENCOUNTER — Ambulatory Visit (INDEPENDENT_AMBULATORY_CARE_PROVIDER_SITE_OTHER): Payer: Medicare Other | Admitting: Podiatry

## 2020-09-13 ENCOUNTER — Encounter: Payer: Self-pay | Admitting: Podiatry

## 2020-09-13 ENCOUNTER — Other Ambulatory Visit: Payer: Self-pay

## 2020-09-13 DIAGNOSIS — M79676 Pain in unspecified toe(s): Secondary | ICD-10-CM | POA: Diagnosis not present

## 2020-09-13 DIAGNOSIS — L603 Nail dystrophy: Secondary | ICD-10-CM

## 2020-09-13 NOTE — Telephone Encounter (Signed)
Scheduled tomorrow with mobile and provided address.

## 2020-09-13 NOTE — Progress Notes (Signed)
  Subjective:  Patient ID: Austin Vaughan, male    DOB: 04-Sep-1970,  MRN: [id number]  Chief Complaint  Patient presents with  . Nail Problem    The toenail on the right big toe is discolored some and the nail came off about 3 to 4 months ago    50 y.o. male presents for wound care. Hx confirmed with patient. Objective:  Physical Exam: Right hallux nail thickened and dystrophic distally, clear proximally. Redundant nail distally. Mildly ingrowing lateral hallux border. No open wounds, right 2nd toe healed Assessment:   1. Nail dystrophy   2. Pain around toenail    Plan:  Patient was evaluated and treated and all questions answered.  Ulcer Right 2nd toe -Remains healed.  Xerosis -Recc OTC Foot Miracle lotion  Venous Insufficiency -Recc compression socks 15-20 mmHg.  Nail Dystrophy -Debrided of excess and ingrowing nail, nail should grow out clear. Small bleeding area dressed with abx ointment and band-aid.  No follow-ups on file.

## 2020-09-14 ENCOUNTER — Ambulatory Visit (INDEPENDENT_AMBULATORY_CARE_PROVIDER_SITE_OTHER): Payer: Medicare Other | Admitting: Physician Assistant

## 2020-09-14 ENCOUNTER — Ambulatory Visit (INDEPENDENT_AMBULATORY_CARE_PROVIDER_SITE_OTHER): Payer: Medicare Other

## 2020-09-14 VITALS — BP 133/84 | HR 95 | Temp 97.4°F | Resp 18 | Ht 74.0 in | Wt 256.0 lb

## 2020-09-14 DIAGNOSIS — R9389 Abnormal findings on diagnostic imaging of other specified body structures: Secondary | ICD-10-CM

## 2020-09-14 DIAGNOSIS — Z794 Long term (current) use of insulin: Secondary | ICD-10-CM

## 2020-09-14 DIAGNOSIS — E1165 Type 2 diabetes mellitus with hyperglycemia: Secondary | ICD-10-CM | POA: Diagnosis not present

## 2020-09-14 DIAGNOSIS — M79602 Pain in left arm: Secondary | ICD-10-CM | POA: Diagnosis not present

## 2020-09-14 DIAGNOSIS — M25512 Pain in left shoulder: Secondary | ICD-10-CM

## 2020-09-14 LAB — POCT GLYCOSYLATED HEMOGLOBIN (HGB A1C): Hemoglobin A1C: 9.3 % — AB (ref 4.0–5.6)

## 2020-09-14 NOTE — Patient Instructions (Signed)
We will call you with the x-ray results.  In the meantime, I have enclosed some information on supportive care  Please let me know if there is anything else we can do for you   Roney Jaffe, PA-C Physician Assistant [facility] [email or web address]    Shoulder Pain Many things can cause shoulder pain, including:  An injury to the shoulder.  Overuse of the shoulder.  Arthritis. The source of the pain can be:  Inflammation.  An injury to the shoulder joint.  An injury to a tendon, ligament, or bone. Follow these instructions at home: Pay attention to changes in your symptoms. Let your health care provider know about them. Follow these instructions to relieve your pain. If you have a sling:  Wear the sling as told by your health care provider. Remove it only as told by your health care provider.  Loosen the sling if your fingers tingle, become numb, or turn cold and blue.  Keep the sling clean.  If the sling is not waterproof: ? Do not let it get wet. Remove it to shower or bathe.  Move your arm as little as possible, but keep your hand moving to prevent swelling. Managing pain, stiffness, and swelling  If directed, put ice on the painful area: ? Put ice in a plastic bag. ? Place a towel between your skin and the bag. ? Leave the ice on for 20 minutes, 2-3 times per day. Stop applying ice if it does not help with the pain.  Squeeze a soft ball or a foam pad as much as possible. This helps to keep the shoulder from swelling. It also helps to strengthen the arm.   General instructions  Take over-the-counter and prescription medicines only as told by your health care provider.  Keep all follow-up visits as told by your health care provider. This is important. Contact a health care provider if:  Your pain gets worse.  Your pain is not relieved with medicines.  New pain develops in your arm, hand, or  fingers. Get help right away if:  Your arm, hand, or fingers: ? Tingle. ? Become numb. ? Become swollen. ? Become painful. ? Turn white or blue. Summary  Shoulder pain can be caused by an injury, overuse, or arthritis.  Pay attention to changes in your symptoms. Let your health care provider know about them.  This condition may be treated with a sling, ice, and pain medicines.  Contact your health care provider if the pain gets worse or new pain develops. Get help right away if your arm, hand, or fingers tingle or become numb, swollen, or painful.  Keep all follow-up visits as told by your health care provider. This is important. This information is not intended to replace advice given to you by your health care provider. Make sure you discuss any questions you have with your health care provider. Document Revised: 02/12/2018 Document Reviewed: 02/12/2018 Elsevier Patient Education  2021 [employer].

## 2020-09-14 NOTE — Progress Notes (Signed)
Patient has taken medication today and patient has eaten today. Patient reports tingling in the arm for the past 3 months. Patient was told he had a possible pinch nerve and the discomfort should subside. Patient shares discomfort is more present.

## 2020-09-14 NOTE — Progress Notes (Signed)
Established Patient Office Visit  Subjective:  Patient ID: Austin Vaughan, male    DOB: Aug 02, 1971  Age: 50 y.o. MRN: [id number]  CC:  Chief Complaint  Patient presents with  . Arm Pain    Left     HPI Austin Vaughan reports that he has been experiencing left arm pain for the past few months.  Reports that he feels the pain from her wrist to his neck and sometimes radiates to his chest.  Reports it is interrupting his sleep, states that he has limited range of movement.  Does feel that it is getting worse. Denies injury / trauma  Takes lyrica and gabapentin for chronic pain issues, states that this does not offer relief from his arm pain    Past Medical History:  Diagnosis Date  . Coronary artery disease   . Diabetes mellitus without complication (HCC)   . Hypertension     Past Surgical History:  Procedure Laterality Date  . CORONARY ANGIOPLASTY WITH STENT PLACEMENT  07/2002    Family History  Problem Relation Age of Onset  . ALS Mother   . Lung cancer Father   . ALS Maternal Aunt   . Heart disease Paternal Grandmother   . Heart disease Paternal Grandfather     Social History   Socioeconomic History  . Marital status: Married    Spouse name: Not on file  . Number of children: 2  . Years of education: 21  . Highest education level: Not on file  Occupational History  . Occupation: disablitiy  Tobacco Use  . Smoking status: Never Smoker  . Smokeless tobacco: Never Used  Vaping Use  . Vaping Use: Never used  Substance and Sexual Activity  . Alcohol use: Not Currently  . Drug use: Never  . Sexual activity: Yes  Other Topics Concern  . Not on file  Social History Narrative   Wife and patient relocated to [place] from [place] a few months ago. - 06/11/18   He is on disability since 2019.     He lives with wife and daughter in a one-level apartment.    Right handed   Social Determinants of Health   Financial Resource Strain: Not on file  Food Insecurity:  Not on file  Transportation Needs: Not on file  Physical Activity: Not on file  Stress: Not on file  Social Connections: Not on file  Intimate Partner Violence: Not on file    Outpatient Medications Prior to Visit  Medication Sig Dispense Refill  . albuterol (PROAIR HFA) 108 (90 Base) MCG/ACT inhaler Inhale 2 puffs into the lungs every 6 (six) hours as needed for wheezing or shortness of breath. 1 each 11  . aspirin EC 81 MG tablet Take 81 mg by mouth daily.    [name] atorvastatin (LIPITOR) 80 MG tablet Take 1 tablet (80 mg total) by mouth daily. 90 tablet 3  . Continuous Blood Gluc Receiver (FREESTYLE LIBRE 14 DAY READER) DEVI 1 Device by Does not apply route as directed. 1 each 0  . Continuous Blood Gluc Sensor (FREESTYLE LIBRE 14 DAY SENSOR) MISC 1 Device by Does not apply route as directed. 2 each 11  . dapagliflozin propanediol (FARXIGA) 10 MG TABS tablet Take 1 tablet (10 mg total) by mouth daily. 90 tablet 3  . dicyclomine (BENTYL) 20 MG tablet Take 1 tablet (20 mg total) by mouth 4 (four) times daily as needed (abdominal cramping). 120 tablet 0  . gabapentin (NEURONTIN) 300 MG capsule 2,100  mg at bedtime.     [name] glucose blood test strip 4x daily 150 each 12  . insulin glargine (LANTUS SOLOSTAR) 100 UNIT/ML Solostar Pen Inject 36 Units into the skin daily. 30 mL 3  . insulin lispro (HUMALOG KWIKPEN) 100 UNIT/ML KwikPen Inject 20 Units into the skin 3 (three) times daily with meals. Max dosage 60 units daily. DX E11.65 60 mL 1  . Insulin Pen Needle 30G X 5 MM MISC 1 Device by Does not apply route as directed. 150 each 11  . levETIRAcetam (KEPPRA) 500 MG tablet Take by mouth.    . metFORMIN (GLUCOPHAGE) 1000 MG tablet Take 1 tablet (1,000 mg total) by mouth 2 (two) times daily with a meal. 180 tablet 3  . metoprolol succinate (TOPROL-XL) 100 MG 24 hr tablet TAKE 1 TABLET BY MOUTH EVERY DAY 180 tablet 2  . montelukast (SINGULAIR) 10 MG tablet Take 10 mg by mouth daily.    . nitroGLYCERIN  (NITRODUR - DOSED IN MG/24 HR) 0.4 mg/hr patch PLACE 1 PATCH (0.4 MG TOTAL) ONTO THE SKIN DAILY. 90 patch 2  . nitroGLYCERIN (NITROSTAT) 0.4 MG SL tablet PLACE 1 TABLET (0.4 MG TOTAL) UNDER THE TONGUE EVERY 5 (FIVE) MINUTES AS NEEDED FOR CHEST PAIN. 25 tablet 3  . nortriptyline (PAMELOR) 50 MG capsule Take 1 capsule (50 mg total) by mouth at bedtime. 90 capsule 2  . NOVOLOG FLEXPEN 100 UNIT/ML FlexPen Inject into the skin.    . pregabalin (LYRICA) 150 MG capsule Take by mouth.    . QUEtiapine (SEROQUEL) 200 MG tablet Take by mouth.    . ramipril (ALTACE) 5 MG capsule TAKE 2 CAPSULES (10 MG TOTAL) BY MOUTH DAILY. 180 capsule 3   No facility-administered medications prior to visit.    Allergies  Allergen Reactions  . Ambien [Zolpidem Tartrate] Other (See Comments)    HALLUCINATIONS  . Shellfish Allergy Anaphylaxis    Seafood    ROS Review of Systems  Constitutional: Negative.   HENT: Negative.   Eyes: Negative.   Respiratory: Negative.   Cardiovascular: Negative.   Gastrointestinal: Negative.   Endocrine: Negative.   Genitourinary: Negative.   Musculoskeletal: Positive for arthralgias. Negative for joint swelling.  Skin: Negative.   Allergic/Immunologic: Negative.   Neurological: Negative.   Hematological: Negative.   Psychiatric/Behavioral: Negative.       Objective:    Physical Exam Musculoskeletal:     Right shoulder: Normal.     Left shoulder: No swelling, tenderness or bony tenderness. Decreased range of motion. Decreased strength.     Right upper arm: Normal.     Left upper arm: No swelling or tenderness.     Right elbow: Normal.     Left elbow: No swelling. Decreased range of motion.     Right forearm: Normal.     Left forearm: No swelling or tenderness.     Right wrist: Normal.     Left wrist: No swelling.     Right hand: Normal.     Left hand: No swelling. Decreased strength.     BP 133/84 (BP Location: Left Arm, Patient Position: Sitting, Cuff Size:  Normal)   Pulse 95   Temp (!) 97.4 F (36.3 C) (Oral)   Resp 18   Ht 6\' 2"  (1.88 m)   Wt 256 lb (116.1 kg)   SpO2 95%   BMI 32.87 kg/m  Wt Readings from Last 3 Encounters:  09/14/20 256 lb (116.1 kg)  04/21/20 246 lb (111.6 kg)  04/07/20 255  lb 9.6 oz (115.9 kg)     Health Maintenance Due  Topic Date Due  . Hepatitis C Screening  Never done  . COVID-19 Vaccine (1) Never done  . FOOT EXAM  Never done  . OPHTHALMOLOGY EXAM  01/02/2020  . INFLUENZA VACCINE  Never done    There are no preventive care reminders to display for this patient.  Lab Results  Component Value Date   TSH 1.570 04/21/2020   Lab Results  Component Value Date   WBC 8.7 04/21/2020   HGB 14.9 04/21/2020   HCT 45.2 04/21/2020   MCV 83 04/21/2020   PLT 302 04/21/2020   Lab Results  Component Value Date   NA 146 (H) 04/21/2020   K 4.6 04/21/2020   CO2 24 04/21/2020   GLUCOSE 158 (H) 04/21/2020   BUN 9 04/21/2020   CREATININE 0.76 04/21/2020   BILITOT 0.7 04/21/2020   ALKPHOS 132 (H) 04/21/2020   AST 13 04/21/2020   ALT 24 04/21/2020   PROT 7.2 04/21/2020   ALBUMIN 4.6 04/21/2020   CALCIUM 9.9 04/21/2020   ANIONGAP 10 08/25/2019   GFR 113.85 10/17/2018   Lab Results  Component Value Date   CHOL 121 04/29/2019   Lab Results  Component Value Date   HDL 43 04/29/2019   Lab Results  Component Value Date   LDLCALC 61 04/29/2019   Lab Results  Component Value Date   TRIG 86 04/29/2019   Lab Results  Component Value Date   CHOLHDL 2.8 04/29/2019   Lab Results  Component Value Date   HGBA1C 9.3 (A) 09/14/2020      Assessment & Plan:   Problem List Items Addressed This Visit      Endocrine   Type 2 diabetes mellitus with hyperglycemia, with long-term current use of insulin (HCC)   Relevant Orders   HgB A1c (Completed)     Other   Acute pain of left shoulder - Primary   Relevant Orders   DG Shoulder Left (Completed)   Left arm pain    1. Acute pain of left  shoulder Patient education given on ice, rest, over-the-counter pain relief as needed - DG Shoulder Left  2. Left arm pain Decreased strength, difficulty with range of motion and shoulder area, imaging to rule out acute trauma, consider tendinitis  3. Type 2 diabetes mellitus with hyperglycemia, with long-term current use of insulin (HCC) A1c increased from 7.15 months ago to 9.3.  Has follow-up with Dr. Delford Field on September 22, 2020 for health maintenance - HgB A1c   I have reviewed the patient's medical history (PMH, PSH, Social History, Family History, Medications, and allergies) , and have been updated if relevant. I spent 20 minutes reviewing chart and  face to face time with patient.    No orders of the defined types were placed in this encounter.   Follow-up: Return if symptoms worsen or fail to improve.    Kasandra Knudsen Mayers, PA-C

## 2020-09-15 ENCOUNTER — Encounter: Payer: Self-pay | Admitting: Physician Assistant

## 2020-09-16 ENCOUNTER — Encounter: Payer: Self-pay | Admitting: Physician Assistant

## 2020-09-16 ENCOUNTER — Telehealth: Payer: Self-pay | Admitting: *Deleted

## 2020-09-16 DIAGNOSIS — M79602 Pain in left arm: Secondary | ICD-10-CM | POA: Insufficient documentation

## 2020-09-16 DIAGNOSIS — M25512 Pain in left shoulder: Secondary | ICD-10-CM | POA: Insufficient documentation

## 2020-09-16 NOTE — Telephone Encounter (Signed)
Patient verified DOB Patient is aware no concerns being noted on the xray of the shoulder. Patient is aware of needing another chest xray to further evaluate the previous findings from 05/2019. Patient shares he will complete the xray on Wednesday coming.

## 2020-09-16 NOTE — Addendum Note (Signed)
Addended by: Roney Jaffe on: 09/16/2020 08:55 AM   Modules accepted: Orders

## 2020-09-21 ENCOUNTER — Other Ambulatory Visit: Payer: Self-pay

## 2020-09-21 ENCOUNTER — Ambulatory Visit (HOSPITAL_COMMUNITY)
Admission: RE | Admit: 2020-09-21 | Discharge: 2020-09-21 | Disposition: A | Discharge status: Home / Self Care | Payer: Medicare Other | Source: Ambulatory Visit | Attending: Physician Assistant | Admitting: Physician Assistant

## 2020-09-21 DIAGNOSIS — R9389 Abnormal findings on diagnostic imaging of other specified body structures: Secondary | ICD-10-CM | POA: Insufficient documentation

## 2020-09-22 ENCOUNTER — Ambulatory Visit: Payer: Medicare Other | Admitting: Critical Care Medicine

## 2020-09-22 NOTE — Telephone Encounter (Signed)
This pt needs to be seen, he  No showed today.  I am out next week  He needs to be seen next week by another provider  ??mobile unit?? team

## 2020-09-22 NOTE — Telephone Encounter (Signed)
Patient is scheduled for 09/23/20.

## 2020-09-22 NOTE — Progress Notes (Deleted)
   Subjective:    Patient ID: Austin Vaughan, male    DOB: Jul 29, 1971, 50 y.o.   MRN: [id number]  50 y.o.M former [facility] PCP pt. Hx HTN CAD PAD PSVT OSA T2DM insulin dependent   DM neuropathy,  09/22/2020      Review of Systems     Objective:   Physical Exam        Assessment & Plan:

## 2020-09-23 ENCOUNTER — Other Ambulatory Visit: Payer: Self-pay

## 2020-09-23 ENCOUNTER — Encounter: Payer: Self-pay | Admitting: Physician Assistant

## 2020-09-23 ENCOUNTER — Ambulatory Visit (INDEPENDENT_AMBULATORY_CARE_PROVIDER_SITE_OTHER): Payer: Medicare Other | Admitting: Physician Assistant

## 2020-09-23 ENCOUNTER — Encounter: Payer: Self-pay | Admitting: *Deleted

## 2020-09-23 VITALS — BP 118/77 | HR 80 | Temp 98.2°F | Resp 18 | Ht 74.0 in | Wt 256.0 lb

## 2020-09-23 DIAGNOSIS — G4733 Obstructive sleep apnea (adult) (pediatric): Secondary | ICD-10-CM | POA: Diagnosis not present

## 2020-09-23 DIAGNOSIS — R9389 Abnormal findings on diagnostic imaging of other specified body structures: Secondary | ICD-10-CM

## 2020-09-23 DIAGNOSIS — R0600 Dyspnea, unspecified: Secondary | ICD-10-CM

## 2020-09-23 DIAGNOSIS — J9811 Atelectasis: Secondary | ICD-10-CM | POA: Diagnosis not present

## 2020-09-23 DIAGNOSIS — J4530 Mild persistent asthma, uncomplicated: Secondary | ICD-10-CM

## 2020-09-23 MED ORDER — FLOVENT DISKUS 50 MCG/BLIST IN AEPB: 1.0000 | INHALATION_SPRAY | Freq: Two times a day (BID) | RESPIRATORY_TRACT | 11 refills | Status: DC

## 2020-09-23 MED ORDER — CETIRIZINE HCL 10 MG PO TABS: 10.0000 mg | ORAL_TABLET | Freq: Every day | ORAL | 11 refills | Status: DC

## 2020-09-23 NOTE — Progress Notes (Signed)
Patient presents to discuss imaging results. Patient has had increased pain in the left shoulder with weakness in the arm. Patient reports SOB QD and using his inhalers to aide in this concern.

## 2020-09-23 NOTE — Progress Notes (Signed)
Established Patient Office Visit  Subjective:  Patient ID: Austin Vaughan, male    DOB: [birth year]  Age: 50 y.o. MRN: [id number]  CC:  Chief Complaint  Patient presents with  . Results    HPI ANTHON HARPOLE presents for review of abnormal chest xray.  CLINICAL DATA:  Abnormal shoulder radiograph from 09/14/2020.  EXAM: CHEST - 2 VIEW  COMPARISON:  Shoulder radiograph September 14, 2020 and chest radiograph June 11, 2019  FINDINGS: The heart size and mediastinal contours are within normal limits. Bibasilar platelike/subsegmental atelectasis. The visualized skeletal structures are unremarkable.  IMPRESSION: Bibasilar platelike/subsegmental atelectasis.   Electronically Signed   By: Maudry Mayhew MD   On: 09/21/2020 20:31  Reports today that he has been using his inhaler 2-3 times a day for the past year.  States that he will have nighttime awakening with SOB, needing to use his inhaler a couple times a month, this has started over the past few months.  States that he has been taking the singulair at night.  Endorses history of asthma as a child.  States that he had a sleep study in [place], approx 5 years ago, was told that he had sleep apnea, but not severe enough to require a CPAP.  Did have covid vaccines and booster     Past Medical History:  Diagnosis Date  . Coronary artery disease   . Diabetes mellitus without complication (HCC)   . Hypertension     Past Surgical History:  Procedure Laterality Date  . CORONARY ANGIOPLASTY WITH STENT PLACEMENT  07/2002    Family History  Problem Relation Age of Onset  . ALS Mother   . Lung cancer Father   . ALS Maternal Aunt   . Heart disease Paternal Grandmother   . Heart disease Paternal Grandfather     Social History   Socioeconomic History  . Marital status: Married    Spouse name: Not on file  . Number of children: 2  . Years of education: 5  . Highest education level: Not on file  Occupational  History  . Occupation: disablitiy  Tobacco Use  . Smoking status: Never Smoker  . Smokeless tobacco: Never Used  Vaping Use  . Vaping Use: Never used  Substance and Sexual Activity  . Alcohol use: Not Currently  . Drug use: Never  . Sexual activity: Yes  Other Topics Concern  . Not on file  Social History Narrative   Wife and patient relocated to [place] from [place] a few months ago. - 06/11/18   He is on disability since 2019.     He lives with wife and daughter in a one-level apartment.    Right handed   Social Determinants of Health   Financial Resource Strain: Not on file  Food Insecurity: Not on file  Transportation Needs: Not on file  Physical Activity: Not on file  Stress: Not on file  Social Connections: Not on file  Intimate Partner Violence: Not on file    Outpatient Medications Prior to Visit  Medication Sig Dispense Refill  . albuterol (PROAIR HFA) 108 (90 Base) MCG/ACT inhaler Inhale 2 puffs into the lungs every 6 (six) hours as needed for wheezing or shortness of breath. 1 each 11  . aspirin EC 81 MG tablet Take 81 mg by mouth daily.    [name] atorvastatin (LIPITOR) 80 MG tablet Take 1 tablet (80 mg total) by mouth daily. 90 tablet 3  . Continuous Blood Gluc Receiver (FREESTYLE LIBRE 14  DAY READER) DEVI 1 Device by Does not apply route as directed. 1 each 0  . Continuous Blood Gluc Sensor (FREESTYLE LIBRE 14 DAY SENSOR) MISC 1 Device by Does not apply route as directed. 2 each 11  . dapagliflozin propanediol (FARXIGA) 10 MG TABS tablet Take 1 tablet (10 mg total) by mouth daily. 90 tablet 3  . dicyclomine (BENTYL) 20 MG tablet Take 1 tablet (20 mg total) by mouth 4 (four) times daily as needed (abdominal cramping). 120 tablet 0  . gabapentin (NEURONTIN) 300 MG capsule 2,100 mg at bedtime.     [name] glucose blood test strip 4x daily 150 each 12  . insulin glargine (LANTUS SOLOSTAR) 100 UNIT/ML Solostar Pen Inject 36 Units into the skin daily. 30 mL 3  . insulin  lispro (HUMALOG KWIKPEN) 100 UNIT/ML KwikPen Inject 20 Units into the skin 3 (three) times daily with meals. Max dosage 60 units daily. DX E11.65 60 mL 1  . Insulin Pen Needle 30G X 5 MM MISC 1 Device by Does not apply route as directed. 150 each 11  . levETIRAcetam (KEPPRA) 500 MG tablet Take by mouth.    . metFORMIN (GLUCOPHAGE) 1000 MG tablet Take 1 tablet (1,000 mg total) by mouth 2 (two) times daily with a meal. 180 tablet 3  . metoprolol succinate (TOPROL-XL) 100 MG 24 hr tablet TAKE 1 TABLET BY MOUTH EVERY DAY 180 tablet 2  . montelukast (SINGULAIR) 10 MG tablet Take 10 mg by mouth daily.    . nitroGLYCERIN (NITRODUR - DOSED IN MG/24 HR) 0.4 mg/hr patch PLACE 1 PATCH (0.4 MG TOTAL) ONTO THE SKIN DAILY. 90 patch 2  . nitroGLYCERIN (NITROSTAT) 0.4 MG SL tablet PLACE 1 TABLET (0.4 MG TOTAL) UNDER THE TONGUE EVERY 5 (FIVE) MINUTES AS NEEDED FOR CHEST PAIN. 25 tablet 3  . nortriptyline (PAMELOR) 50 MG capsule Take 1 capsule (50 mg total) by mouth at bedtime. 90 capsule 2  . NOVOLOG FLEXPEN 100 UNIT/ML FlexPen Inject into the skin.    . pregabalin (LYRICA) 150 MG capsule Take by mouth.    . QUEtiapine (SEROQUEL) 200 MG tablet Take by mouth.    . ramipril (ALTACE) 5 MG capsule TAKE 2 CAPSULES (10 MG TOTAL) BY MOUTH DAILY. 180 capsule 3   No facility-administered medications prior to visit.    Allergies  Allergen Reactions  . Ambien [Zolpidem Tartrate] Other (See Comments)    HALLUCINATIONS  . Shellfish Allergy Anaphylaxis    Seafood    ROS Review of Systems  Constitutional: Negative.   HENT: Negative.   Eyes: Negative.   Respiratory: Positive for shortness of breath. Negative for cough and wheezing.   Cardiovascular: Negative for chest pain and palpitations.  Gastrointestinal: Negative.   Endocrine: Negative.   Genitourinary: Negative.   Musculoskeletal: Negative.   Skin: Positive for color change.  Allergic/Immunologic: Negative.   Neurological: Negative.   Hematological:  Negative.   Psychiatric/Behavioral: Negative.       Objective:    Physical Exam Vitals and nursing note reviewed.  Constitutional:      General: He is not in acute distress.    Appearance: Normal appearance. He is not ill-appearing.  HENT:     Head: Normocephalic and atraumatic.     Right Ear: External ear normal.     Left Ear: External ear normal.     Nose: Nose normal.     Mouth/Throat:     Mouth: Mucous membranes are moist.     Pharynx: Oropharynx is clear.  Eyes:     Extraocular Movements: Extraocular movements intact.     Conjunctiva/sclera: Conjunctivae normal.     Pupils: Pupils are equal, round, and reactive to light.  Cardiovascular:     Rate and Rhythm: Normal rate and regular rhythm.     Pulses: Normal pulses.     Heart sounds: Normal heart sounds.  Pulmonary:     Effort: Pulmonary effort is normal.     Breath sounds: Normal breath sounds. No wheezing.  Musculoskeletal:        General: Normal range of motion.     Cervical back: Normal range of motion and neck supple.  Skin:    General: Skin is warm and dry.  Neurological:     General: No focal deficit present.     Mental Status: He is alert and oriented to person, place, and time.  Psychiatric:        Mood and Affect: Mood normal.        Behavior: Behavior normal.        Thought Content: Thought content normal.        Judgment: Judgment normal.     BP 118/77 (BP Location: Right Arm, Patient Position: Sitting, Cuff Size: Normal)   Pulse 80   Temp 98.2 F (36.8 C) (Oral)   Resp 18   Ht 6\' 2"  (1.88 m)   Wt 256 lb (116.1 kg)   SpO2 95%   BMI 32.87 kg/m  Wt Readings from Last 3 Encounters:  09/23/20 256 lb (116.1 kg)  09/14/20 256 lb (116.1 kg)  04/21/20 246 lb (111.6 kg)     Health Maintenance Due  Topic Date Due  . Hepatitis C Screening  Never done  . FOOT EXAM  Never done  . OPHTHALMOLOGY EXAM  01/02/2020    There are no preventive care reminders to display for this patient.  Lab  Results  Component Value Date   TSH 1.570 04/21/2020   Lab Results  Component Value Date   WBC 8.7 04/21/2020   HGB 14.9 04/21/2020   HCT 45.2 04/21/2020   MCV 83 04/21/2020   PLT 302 04/21/2020   Lab Results  Component Value Date   NA 146 (H) 04/21/2020   K 4.6 04/21/2020   CO2 24 04/21/2020   GLUCOSE 158 (H) 04/21/2020   BUN 9 04/21/2020   CREATININE 0.76 04/21/2020   BILITOT 0.7 04/21/2020   ALKPHOS 132 (H) 04/21/2020   AST 13 04/21/2020   ALT 24 04/21/2020   PROT 7.2 04/21/2020   ALBUMIN 4.6 04/21/2020   CALCIUM 9.9 04/21/2020   ANIONGAP 10 08/25/2019   GFR 113.85 10/17/2018   Lab Results  Component Value Date   CHOL 121 04/29/2019   Lab Results  Component Value Date   HDL 43 04/29/2019   Lab Results  Component Value Date   LDLCALC 61 04/29/2019   Lab Results  Component Value Date   TRIG 86 04/29/2019   Lab Results  Component Value Date   CHOLHDL 2.8 04/29/2019   Lab Results  Component Value Date   HGBA1C 9.3 (A) 09/14/2020      Assessment & Plan:   Problem List Items Addressed This Visit      Respiratory   Obstructive sleep apnea   Relevant Orders   Ambulatory referral to Sleep Studies    Other Visit Diagnoses    Mild persistent asthma, unspecified whether complicated    -  Primary   Relevant Medications   cetirizine (ZYRTEC) 10 MG tablet  fluticasone (FLOVENT DISKUS) 50 MCG/BLIST diskus inhaler   Atelectasis       Relevant Orders   CT Chest Wo Contrast   Abnormal chest x-ray       Dyspnea, unspecified type          Meds ordered this encounter  Medications  . cetirizine (ZYRTEC) 10 MG tablet    Sig: Take 1 tablet (10 mg total) by mouth daily.    Dispense:  30 tablet    Refill:  11    Order Specific Question:   Supervising Provider    Answer:   Shan Levans E [[id number]]  . fluticasone (FLOVENT DISKUS) 50 MCG/BLIST diskus inhaler    Sig: Inhale 1 puff into the lungs 2 (two) times daily.    Dispense:  60 each    Refill:  11     Order Specific Question:   Supervising Provider    Answer:   Delford Field, PATRICK E [[id number]]  1. Mild persistent asthma, unspecified whether complicated Child Zyrtec once daily, continue Singulair at bedtime.  Trial Flovent once daily.  Patient encouraged to keep daily log of use of albuterol rescue inhaler.  Patient has appointment to establish with Dr. Delford Field on October 21, 2020 - cetirizine (ZYRTEC) 10 MG tablet; Take 1 tablet (10 mg total) by mouth daily.  Dispense: 30 tablet; Refill: 11 - fluticasone (FLOVENT DISKUS) 50 MCG/BLIST diskus inhaler; Inhale 1 puff into the lungs 2 (two) times daily.  Dispense: 60 each; Refill: 11  2. Atelectasis Scheduled for February 15 at 4 PM - CT Chest Wo Contrast; Future  3. Abnormal chest x-ray   4. Obstructive sleep apnea  - Ambulatory referral to Sleep Studies  5. Dyspnea, unspecified type    I have reviewed the patient's medical history (PMH, PSH, Social History, Family History, Medications, and allergies) , and have been updated if relevant. I spent 30 minutes reviewing chart and  face to face time with patient.    Follow-up: Return in about 1 month (around 10/21/2020) for [facility] with Dr. Delford Field .    Kasandra Knudsen Paul Trettin, PA-C

## 2020-09-23 NOTE — Patient Instructions (Signed)
You will start taking Zyrtec in the morning, continue Singulair at bedtime. You will start using a daily inhaler, and use the albuterol inhaler only as needed.  We will work on getting a CT scheduled to follow-up chest x-ray, and a referral for a repeat sleep study.  I encourage you to keep a diary of your shortness of breath episodes.  Please let us know if there is anything else we can do for you  Roney Jaffe, PA-C Physician Assistant [facility] [email or web address]-[email or web address]-[email or web address]">  Asthma, Adult  Asthma is a long-term (chronic) condition that causes recurrent episodes in which the airways become tight and narrow. The airways are the passages that lead from the nose and mouth down into the lungs. Asthma episodes, also called asthma attacks, can cause coughing, wheezing, shortness of breath, and chest pain. The airways can also fill with mucus. During an attack, it can be difficult to breathe. Asthma attacks can range from minor to life threatening. Asthma cannot be cured, but medicines and lifestyle changes can help control it and treat acute attacks. What are the causes? This condition is believed to be caused by inherited (genetic) and environmental factors, but its exact cause is not known. There are many things that can bring on an asthma attack or make asthma symptoms worse (triggers). Asthma triggers are different for each person. Common triggers include:  Mold.  Dust.  Cigarette smoke.  Cockroaches.  Things that can cause allergy symptoms (allergens), such as animal dander or pollen from trees or grass.  Air pollutants such as household cleaners, wood smoke, smog, or Therapist, occupational.  Cold air, weather changes, and winds (which increase molds and pollen in the air).  Strong emotional expressions such as crying or laughing hard.  Stress.  Certain medicines (such as  aspirin) or types of medicines (such as beta-blockers).  Sulfites in foods and drinks. Foods and drinks that may contain sulfites include dried fruit, potato chips, and sparkling grape juice.  Infections or inflammatory conditions such as the flu, a cold, or inflammation of the nasal membranes (rhinitis).  Gastroesophageal reflux disease (GERD).  Exercise or strenuous activity. What are the signs or symptoms? Symptoms of this condition may occur right after asthma is triggered or many hours later. Symptoms include:  Wheezing. This can sound like whistling when you breathe.  Excessive nighttime or early morning coughing.  Frequent or severe coughing with a common cold.  Chest tightness.  Shortness of breath.  Tiredness (fatigue) with minimal activity. How is this diagnosed? This condition is diagnosed based on:  Your medical history.  A physical exam.  Tests, which may include: ? Lung function studies and pulmonary studies (spirometry). These tests can evaluate the flow of air in your lungs. ? Allergy tests. ? Imaging tests, such as X-rays. How is this treated? There is no cure for this condition, but treatment can help control your symptoms. Treatment for asthma usually involves:  Identifying and avoiding your asthma triggers.  Using medicines to control your symptoms. Generally, two types of medicines are used to treat asthma: ? Controller medicines. These help prevent asthma symptoms from occurring. They are usually taken every day. ? Fast-acting reliever or rescue medicines. These quickly relieve asthma symptoms by widening the narrow and tight airways. They are used as needed and provide short-term relief.  Using supplemental oxygen. This may be needed during a severe episode.  Using other medicines, such as: ? Allergy medicines, such as antihistamines, if your  asthma attacks are triggered by allergens. ? Immune medicines (immunomodulators). These are medicines that  help control the immune system.  Creating an asthma action plan. An asthma action plan is a written plan for managing and treating your asthma attacks. This plan includes: ? A list of your asthma triggers and how to avoid them. ? Information about when medicines should be taken and when their dosage should be changed. ? Instructions about using a device called a peak flow meter. A peak flow meter measures how well the lungs are working and the severity of your asthma. It helps you monitor your condition. Follow these instructions at home: Controlling your home environment Control your home environment in the following ways to help avoid triggers and prevent asthma attacks:  Change your heating and air conditioning filter regularly.  Limit your use of fireplaces and wood stoves.  Get rid of pests (such as roaches and mice) and their droppings.  Throw away plants if you see mold on them.  Clean floors and dust surfaces regularly. Use unscented cleaning products.  Try to have someone else vacuum for you regularly. Stay out of rooms while they are being vacuumed and for a short while afterward. If you vacuum, use a dust mask from a hardware store, a double-layered or microfilter vacuum cleaner bag, or a vacuum cleaner with a HEPA filter.  Replace carpet with wood, tile, or vinyl flooring. Carpet can trap dander and dust.  Use allergy-proof pillows, mattress covers, and box spring covers.  Keep your bedroom a trigger-free room.  Avoid pets and keep windows closed when allergens are in the air.  Wash beddings every week in hot water and dry them in a dryer.  Use blankets that are made of polyester or cotton.  Clean bathrooms and kitchens with bleach. If possible, have someone repaint the walls in these rooms with mold-resistant paint. Stay out of the rooms that are being cleaned and painted.  Wash your hands often with soap and water. If soap and water are not available, use hand  sanitizer.  Do not allow anyone to smoke in your home. General instructions  Take over-the-counter and prescription medicines only as told by your health care provider. ? Speak with your health care provider if you have questions about how or when to take the medicines. ? Make note if you are requiring more frequent dosages.  Do not use any products that contain nicotine or tobacco, such as cigarettes and e-cigarettes. If you need help quitting, ask your health care provider. Also, avoid being exposed to secondhand smoke.  Use a peak flow meter as told by your health care provider. Record and keep track of the readings.  Understand and use the asthma action plan to help minimize, or stop an asthma attack, without needing to seek medical care.  Make sure you stay up to date on your yearly vaccinations as told by your health care provider. This may include vaccines for the flu and pneumonia.  Avoid outdoor activities when allergen counts are high and when air quality is low.  Wear a ski mask that covers your nose and mouth during outdoor winter activities. Exercise indoors on cold days if you can.  Warm up before exercising, and take time for a cool-down period after exercise.  Keep all follow-up visits as told by your health care provider. This is important. Where to find more information  For information about asthma, turn to the Centers for Disease Control and Prevention at http://www.mills-berg.com/  For air quality information, turn to [employer] at http://hood.com/ Contact a health care provider if:  You have wheezing, shortness of breath, or a cough even while you are taking medicine to prevent attacks.  The mucus you cough up (sputum) is thicker than usual.  Your sputum changes from clear or white to yellow, green, gray, or bloody.  Your medicines are causing side effects, such as a rash, itching, swelling, or trouble breathing.  You need to use a reliever medicine more than 2-3 times  a week.  Your peak flow reading is still at 50-79% of your personal best after following your action plan for 1 hour.  You have a fever. Get help right away if:  You are getting worse and do not respond to treatment during an asthma attack.  You are short of breath when at rest or when doing very little physical activity.  You have difficulty eating, drinking, or talking.  You have chest pain or tightness.  You develop a fast heartbeat or palpitations.  You have a bluish color to your lips or fingernails.  You are light-headed or dizzy, or you faint.  Your peak flow reading is less than 50% of your personal best.  You feel too tired to breathe normally. Summary  Asthma is a long-term (chronic) condition that causes recurrent episodes in which the airways become tight and narrow. These episodes can cause coughing, wheezing, shortness of breath, and chest pain.  Asthma cannot be cured, but medicines and lifestyle changes can help control it and treat acute attacks.  Make sure you understand how to avoid triggers and how and when to use your medicines.  Asthma attacks can range from minor to life threatening. Get help right away if you have an asthma attack and do not respond to treatment with your usual rescue medicines. This information is not intended to replace advice given to you by your health care provider. Make sure you discuss any questions you have with your health care provider. Document Revised: 04/30/2020 Document Reviewed: 12/03/2019 Elsevier Patient Education  2021 [employer].

## 2020-09-26 ENCOUNTER — Encounter: Payer: Self-pay | Admitting: Physician Assistant

## 2020-09-26 DIAGNOSIS — J4540 Moderate persistent asthma, uncomplicated: Secondary | ICD-10-CM | POA: Insufficient documentation

## 2020-09-26 DIAGNOSIS — J4530 Mild persistent asthma, uncomplicated: Secondary | ICD-10-CM | POA: Insufficient documentation

## 2020-09-26 DIAGNOSIS — J9811 Atelectasis: Secondary | ICD-10-CM | POA: Insufficient documentation

## 2020-09-26 DIAGNOSIS — R9389 Abnormal findings on diagnostic imaging of other specified body structures: Secondary | ICD-10-CM | POA: Insufficient documentation

## 2020-09-28 ENCOUNTER — Other Ambulatory Visit: Payer: Self-pay

## 2020-09-28 ENCOUNTER — Encounter (HOSPITAL_COMMUNITY): Payer: Self-pay

## 2020-09-28 ENCOUNTER — Ambulatory Visit (HOSPITAL_COMMUNITY)
Admission: RE | Admit: 2020-09-28 | Discharge: 2020-09-28 | Disposition: A | Discharge status: Home / Self Care | Payer: Medicare Other | Source: Ambulatory Visit | Attending: Physician Assistant | Admitting: Physician Assistant

## 2020-09-28 DIAGNOSIS — J9811 Atelectasis: Secondary | ICD-10-CM | POA: Diagnosis present

## 2020-09-30 ENCOUNTER — Ambulatory Visit: Payer: Medicare Other | Admitting: Gastroenterology

## 2020-10-04 NOTE — Telephone Encounter (Signed)
Patient has viewed providers result note via mychart and will follow up accordingly.

## 2020-10-12 ENCOUNTER — Ambulatory Visit (INDEPENDENT_AMBULATORY_CARE_PROVIDER_SITE_OTHER): Payer: Medicare Other | Admitting: Cardiology

## 2020-10-12 ENCOUNTER — Ambulatory Visit: Payer: Medicare Other | Admitting: Internal Medicine

## 2020-10-12 ENCOUNTER — Encounter: Payer: Self-pay | Admitting: Cardiology

## 2020-10-12 ENCOUNTER — Other Ambulatory Visit: Payer: Self-pay

## 2020-10-12 VITALS — BP 119/78 | HR 81 | Ht 74.0 in | Wt 255.8 lb

## 2020-10-12 DIAGNOSIS — I10 Essential (primary) hypertension: Secondary | ICD-10-CM

## 2020-10-12 DIAGNOSIS — E785 Hyperlipidemia, unspecified: Secondary | ICD-10-CM | POA: Diagnosis not present

## 2020-10-12 DIAGNOSIS — Z794 Long term (current) use of insulin: Secondary | ICD-10-CM

## 2020-10-12 DIAGNOSIS — I471 Supraventricular tachycardia: Secondary | ICD-10-CM

## 2020-10-12 DIAGNOSIS — I2510 Atherosclerotic heart disease of native coronary artery without angina pectoris: Secondary | ICD-10-CM

## 2020-10-12 DIAGNOSIS — E1142 Type 2 diabetes mellitus with diabetic polyneuropathy: Secondary | ICD-10-CM

## 2020-10-12 DIAGNOSIS — Z9861 Coronary angioplasty status: Secondary | ICD-10-CM | POA: Diagnosis not present

## 2020-10-12 DIAGNOSIS — Z79899 Other long term (current) drug therapy: Secondary | ICD-10-CM

## 2020-10-12 DIAGNOSIS — M79602 Pain in left arm: Secondary | ICD-10-CM

## 2020-10-12 NOTE — Patient Instructions (Signed)
Medication Instructions:  Continue current medicaitons  *If you need a refill on your cardiac medications before your next appointment, please call your pharmacy*   Lab Work: CBC and BMP  If you have labs (blood work) drawn today and your tests are completely normal, you will receive your results only by: [name] MyChart Message (if you have MyChart) OR . A paper copy in the mail If you have any lab test that is abnormal or we need to change your treatment, we will call you to review the results.   Testing/Procedures: None Ordered   Follow-Up: At [facility], you and your health needs are our priority.  As part of our continuing mission to provide you with exceptional heart care, we have created designated Provider Care Teams.  These Care Teams include your primary Cardiologist (physician) and Advanced Practice Providers (APPs -  Physician Assistants and Nurse Practitioners) who all work together to provide you with the care you need, when you need it.  We recommend signing up for the patient portal called "MyChart".  Sign up information is provided on this After Visit Summary.  MyChart is used to connect with patients for Virtual Visits (Telemedicine).  Patients are able to view lab/test results, encounter notes, upcoming appointments, etc.  Non-urgent messages can be sent to your provider as well.   To learn more about what you can do with MyChart, go to [email or web address].    Your next appointment:   6 month(s)  The format for your next appointment:   In Person  Provider:   You may see Chilton Si, MD or one of the following Advanced Practice Providers on your designated Care Team:    [place], PA-C  Edd Fabian, FNP    Other Instructions Use cold compress on left shoulder daily Take Aleve twice a day to relieve pain

## 2020-10-12 NOTE — Progress Notes (Signed)
Cardiology Office Note:    Date:  10/12/2020   ID:  Austin Vaughan, DOB 1971-06-29, MRN [id number]  PCP:  Storm Frisk, MD  Cardiologist:  Chilton Si, MD  Electrophysiologist:  None   Referring MD: Cain Saupe, MD   No chief complaint on file.   History of Present Illness:    Austin Vaughan is a 50 y.o. male with a hx of CAD, status post MI in 2013 treated with PCI, this was while he was living in [place].  The details are not known.  He moved to [place] in 2019 to be closer to his family.  He had a stress test in September 2019 that was negative for ischemia.  Other medical issues include insulin-dependent diabetes with neuropathy, palpitations with PSVT documented on ZIO, treated dyslipidemia, and diabetic toe ulcer.  The patient had some palpitations in February 2020.  An outpatient monitor did document runs of PSVT and his beta-blocker was increased.  In September 2020 he had some issues with chest pain, echocardiogram showed normal LVF and diastolic impairment.    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  LE a Dopplers done 08/26/2019 showed mild decrease in the right ABI and normal left ABI.  It is felt he has small vessel diabetic disease.  This has been stable.  He has a family history of ALS and he went to [facility] in Oct 2020 for further evaluation, they felt his symptoms were secondary to diabetic neuropathy as opposed to ALS.   Patient is in the office today for 1 year follow-up.  He was contacted virtually last March.  Since then he is done well from a cardiac standpoint.  He does complain of left shoulder pain.  On further investigation this appears to be left biceps tendinitis.  He is not had anginal symptoms.  His blood pressures controlled.  Past Medical History:  Diagnosis Date  . Coronary artery disease   . Diabetes mellitus without complication (HCC)   . Hypertension     Past Surgical History:  Procedure Laterality Date  . CORONARY  ANGIOPLASTY WITH STENT PLACEMENT  07/2002    Current Medications: No outpatient medications have been marked as taking for the 10/12/20 encounter (Office Visit) with Abelino Derrick, PA-C.     Allergies:   Ambien [zolpidem tartrate] and Shellfish allergy   Social History   Socioeconomic History  . Marital status: Married    Spouse name: Not on file  . Number of children: 2  . Years of education: 3  . Highest education level: Not on file  Occupational History  . Occupation: disablitiy  Tobacco Use  . Smoking status: Never Smoker  . Smokeless tobacco: Never Used  Vaping Use  . Vaping Use: Never used  Substance and Sexual Activity  . Alcohol use: Not Currently  . Drug use: Never  . Sexual activity: Yes  Other Topics Concern  . Not on file  Social History Narrative   Wife and patient relocated to [place] from [place] a few months ago. - 06/11/18   He is on disability since 2019.     He lives with wife and daughter in a one-level apartment.    Right handed   Social Determinants of Health   Financial Resource Strain: Not on file  Food Insecurity: Not on file  Transportation Needs: Not on file  Physical Activity: Not on file  Stress: Not on file  Social Connections: Not on file  Family History: [name] family history includes ALS in his maternal aunt and mother; Heart disease in his paternal grandfather and paternal grandmother; Lung cancer in his father.  ROS:   Please see the history of present illness.     All other systems reviewed and are negative.  EKGs/Labs/Other Studies Reviewed:    The following studies were reviewed today: Myoview 2019- Low risk  Echo 05/05/2019- IMPRESSIONS    1. Left ventricular ejection fraction, by visual estimation, is 55 to  60%. The left ventricle has normal function. Normal left ventricular size.  There is no left ventricular hypertrophy.  2. Left ventricular diastolic Doppler parameters are consistent with   impaired relaxation pattern of LV diastolic filling.  3. The aortic valve is tricuspid Aortic valve regurgitation was not  visualized by color flow Doppler. Mild aortic valve sclerosis without  stenosis.  4. Global right ventricle has normal systolic function.The right  ventricular size is normal. No increase in right ventricular wall  thickness.  5. Right atrial size was normal.  6. Left atrial size was normal.  7. The inferior vena cava is normal in size with greater than 50%  respiratory variability, suggesting right atrial pressure of 3 mmHg.  8. The mitral valve is normal in structure. No evidence of mitral valve  regurgitation. No evidence of mitral stenosis.  9. The tricuspid valve is normal in structure. Tricuspid valve  regurgitation was not visualized by color flow Doppler.  10. TR signal is inadequate for assessing pulmonary artery systolic  pressure.   EKG:  EKG is not ordered today.  The ekg ordered 05/04/2020 demonstrates NSR, HR 88  Recent Labs: 04/21/2020: ALT 24; BUN 9; Creatinine, Ser 0.76; Hemoglobin 14.9; Magnesium 2.0; Platelets 302; Potassium 4.6; Sodium 146; TSH 1.570  Recent Lipid Panel    Component Value Date/Time   CHOL 121 04/29/2019 1143   TRIG 86 04/29/2019 1143   HDL 43 04/29/2019 1143   CHOLHDL 2.8 04/29/2019 1143   LDLCALC 61 04/29/2019 1143    Physical Exam:    VS:  Ht 6\' 2"  (1.88 m)   Wt 255 lb 12.8 oz (116 kg)   BMI 32.84 kg/m     Wt Readings from Last 3 Encounters:  10/12/20 255 lb 12.8 oz (116 kg)  09/23/20 256 lb (116.1 kg)  09/14/20 256 lb (116.1 kg)     GEN: Overweight AA male,  well developed in no acute distress HEENT: Normal NECK: No JVD; No carotid bruits LYMPHATICS: No lymphadenopathy CARDIAC: RRR, short SEM AOV, no rubs, gallops RESPIRATORY:  Clear to auscultation without rales, wheezing or rhonchi  ABDOMEN: Soft, non-tender, non-distended MUSCULOSKELETAL:  No edema; No deformity  SKIN: Warm and dry, multiple  tattoos NEUROLOGIC:  Alert and oriented x 3 PSYCHIATRIC:  Normal affect   ASSESSMENT:    Palpitations Fairly infrequent and short lived.  If these become more frequent consider changing Amlodipine to Diltiazem.  I also suggested he could try valsalva maneuver.   PSVT Runs of PSVT on OP monitor feb 2020  Insulin dependent diabetes mellitus with complications (HCC) Pt has severe neuropathy  Dyslipidemia, goal LDL below 70 On Lipitor 80 mg, LDL 61 Sept 2020  CAD S/P percutaneous coronary angioplasty S/P MI- PCI of unknown vessel in [place] 2013 Low risk Myoview 2019 and normal LVF by echo Sept 2020  HTN- Controlled  Diabetic rt second toe ulcer- Being followed weekly. Gradual improvement  Lt shoulder pain- Sounds like Lt biceps tendonitis- suggest cold compress QID and  Aleve 400 mg BID x 4 days.  He has a f.u 3/10 with Dr Delford Field.  I ordered BMP and CBC today.  F/U Dr Duke Salvia in 6 months.   PLAN:    As above-   Medication Adjustments/Labs and Tests Ordered: Current medicines are reviewed at length with the patient today.  Concerns regarding medicines are outlined above.  No orders of the defined types were placed in this encounter.  No orders of the defined types were placed in this encounter.   There are no Patient Instructions on file for this visit.   Jolene Provost, PA-C  10/12/2020 11:54 AM    [facility]

## 2020-10-19 ENCOUNTER — Ambulatory Visit: Payer: Medicare Other | Admitting: Internal Medicine

## 2020-10-21 ENCOUNTER — Ambulatory Visit: Payer: Medicare Other | Attending: Critical Care Medicine | Admitting: Critical Care Medicine

## 2020-10-21 ENCOUNTER — Other Ambulatory Visit: Payer: Self-pay

## 2020-10-21 ENCOUNTER — Encounter: Payer: Self-pay | Admitting: Critical Care Medicine

## 2020-10-21 DIAGNOSIS — Z7984 Long term (current) use of oral hypoglycemic drugs: Secondary | ICD-10-CM | POA: Diagnosis not present

## 2020-10-21 DIAGNOSIS — F418 Other specified anxiety disorders: Secondary | ICD-10-CM | POA: Insufficient documentation

## 2020-10-21 DIAGNOSIS — Z888 Allergy status to other drugs, medicaments and biological substances status: Secondary | ICD-10-CM | POA: Insufficient documentation

## 2020-10-21 DIAGNOSIS — Z7982 Long term (current) use of aspirin: Secondary | ICD-10-CM | POA: Insufficient documentation

## 2020-10-21 DIAGNOSIS — G4730 Sleep apnea, unspecified: Secondary | ICD-10-CM | POA: Diagnosis not present

## 2020-10-21 DIAGNOSIS — J9811 Atelectasis: Secondary | ICD-10-CM

## 2020-10-21 DIAGNOSIS — L97519 Non-pressure chronic ulcer of other part of right foot with unspecified severity: Secondary | ICD-10-CM

## 2020-10-21 DIAGNOSIS — Z8249 Family history of ischemic heart disease and other diseases of the circulatory system: Secondary | ICD-10-CM | POA: Diagnosis not present

## 2020-10-21 DIAGNOSIS — I10 Essential (primary) hypertension: Secondary | ICD-10-CM | POA: Diagnosis not present

## 2020-10-21 DIAGNOSIS — Z79899 Other long term (current) drug therapy: Secondary | ICD-10-CM | POA: Insufficient documentation

## 2020-10-21 DIAGNOSIS — Z7951 Long term (current) use of inhaled steroids: Secondary | ICD-10-CM | POA: Insufficient documentation

## 2020-10-21 DIAGNOSIS — G4733 Obstructive sleep apnea (adult) (pediatric): Secondary | ICD-10-CM | POA: Diagnosis not present

## 2020-10-21 DIAGNOSIS — J4540 Moderate persistent asthma, uncomplicated: Secondary | ICD-10-CM | POA: Insufficient documentation

## 2020-10-21 DIAGNOSIS — E785 Hyperlipidemia, unspecified: Secondary | ICD-10-CM | POA: Insufficient documentation

## 2020-10-21 DIAGNOSIS — E1142 Type 2 diabetes mellitus with diabetic polyneuropathy: Secondary | ICD-10-CM | POA: Diagnosis not present

## 2020-10-21 DIAGNOSIS — Z9861 Coronary angioplasty status: Secondary | ICD-10-CM

## 2020-10-21 DIAGNOSIS — G8929 Other chronic pain: Secondary | ICD-10-CM | POA: Diagnosis not present

## 2020-10-21 DIAGNOSIS — Z8673 Personal history of transient ischemic attack (TIA), and cerebral infarction without residual deficits: Secondary | ICD-10-CM | POA: Insufficient documentation

## 2020-10-21 DIAGNOSIS — M79602 Pain in left arm: Secondary | ICD-10-CM

## 2020-10-21 DIAGNOSIS — Z794 Long term (current) use of insulin: Secondary | ICD-10-CM

## 2020-10-21 DIAGNOSIS — Z760 Encounter for issue of repeat prescription: Secondary | ICD-10-CM | POA: Diagnosis not present

## 2020-10-21 DIAGNOSIS — E11621 Type 2 diabetes mellitus with foot ulcer: Secondary | ICD-10-CM | POA: Insufficient documentation

## 2020-10-21 DIAGNOSIS — I2510 Atherosclerotic heart disease of native coronary artery without angina pectoris: Secondary | ICD-10-CM | POA: Diagnosis not present

## 2020-10-21 DIAGNOSIS — I639 Cerebral infarction, unspecified: Secondary | ICD-10-CM

## 2020-10-21 MED ORDER — INSULIN PEN NEEDLE 30G X 5 MM MISC: 1.0000 | 11 refills | Status: DC

## 2020-10-21 MED ORDER — RAMIPRIL 5 MG PO CAPS: ORAL_CAPSULE | ORAL | 3 refills | Status: DC

## 2020-10-21 MED ORDER — DAPAGLIFLOZIN PROPANEDIOL 10 MG PO TABS: 10.0000 mg | ORAL_TABLET | Freq: Every day | ORAL | 3 refills | Status: DC

## 2020-10-21 MED ORDER — MONTELUKAST SODIUM 10 MG PO TABS: 10.0000 mg | ORAL_TABLET | Freq: Every day | ORAL | 2 refills | Status: DC

## 2020-10-21 MED ORDER — NITROGLYCERIN 0.4 MG/HR TD PT24: 0.4000 mg | MEDICATED_PATCH | Freq: Every day | TRANSDERMAL | 2 refills | Status: DC

## 2020-10-21 MED ORDER — LANTUS SOLOSTAR 100 UNIT/ML ~~LOC~~ SOPN
36.0000 [IU] | PEN_INJECTOR | Freq: Every day | SUBCUTANEOUS | 3 refills | Status: DC
Start: 2020-10-21 — End: 2020-11-03

## 2020-10-21 MED ORDER — ATORVASTATIN CALCIUM 80 MG PO TABS: 80.0000 mg | ORAL_TABLET | Freq: Every day | ORAL | 3 refills | Status: DC

## 2020-10-21 MED ORDER — METFORMIN HCL 1000 MG PO TABS
1000.0000 mg | ORAL_TABLET | Freq: Two times a day (BID) | ORAL | 3 refills | Status: DC
Start: 2020-10-21 — End: 2021-02-03

## 2020-10-21 MED ORDER — INSULIN LISPRO (1 UNIT DIAL) 100 UNIT/ML (KWIKPEN): PEN_INJECTOR | SUBCUTANEOUS | 1 refills | Status: DC

## 2020-10-21 MED ORDER — NOVOLOG FLEXPEN 100 UNIT/ML ~~LOC~~ SOPN
20.0000 [IU] | PEN_INJECTOR | Freq: Three times a day (TID) | SUBCUTANEOUS | 1 refills | Status: DC
Start: 2020-10-21 — End: 2021-02-03

## 2020-10-21 NOTE — Assessment & Plan Note (Addendum)
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

## 2020-10-21 NOTE — Assessment & Plan Note (Signed)
Patient at goal in 2020 will need follow-up lipid panel

## 2020-10-21 NOTE — Assessment & Plan Note (Signed)
Moderate persistent asthma will switch over to [facility] from Flovent discus and dose will be 2 puffs twice daily will also continue albuterol as prescribed

## 2020-10-21 NOTE — Progress Notes (Signed)
Subjective:    Patient ID: Austin Vaughan, male    DOB: Dec 26, 1970, 50 y.o.   MRN: [id number] .Virtual Visit via Video Note  I connected with@ on 10/21/20 at@ by a video enabled telemedicine application and verified that I am speaking with the correct person using two identifiers.   Consent:  I discussed the limitations, risks, security and privacy concerns of performing an evaluation and management service by video visit and the availability of in person appointments. I also discussed with the patient that there may be a patient responsible charge related to this service. The patient expressed understanding and agreed to proceed.  Location of patient: Patient's at home  Location of provider: I am in my office  Persons participating in the televisit with the patient.    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  This is a former primary care patient of Dr. Chapman Fitch transitioning care now to my service.  Patient was seen in the mobile medicine unit in February and given a prescription for cetirizine Flovent discus.  Follow-up CT of the chest had been done because of abnormal chest x-ray showing platelike mild atelectasis left mid and lower lung zone.  Patient also had a referral for sleep studies however this has not been completed.  Patient is needing refills on multiple medications.  He does have an endocrinologist as an upcoming appointment.  He is on insulin glargine 36 units at bedtime and insulin NovoLog 20 units 3 times daily.  Patient has a continuous glucose readout.  Blood sugars been running anywhere from 110-300 and his last A1c was 9.3.  He is due up an eye  exam.  Patient was seen by cardiology they ordered a CBC and a be met which is yet to result.  He complains of chronic abdominal pain and has been followed by gastroenterology is felt to have gastroparesis and is on Bentyl for this.  With his asthma he is on the Flovent 1 puff twice a day and the albuterol as needed using albuterol excessively at this time.  Follows with podiatry was just seen by podiatry recently and had his toenails clipped.  His diabetic foot ulcer has resolved.  He is followed by [facility]y at [facility] for severe polyneuropathy and had ALS rule out because there is a family history of ALS.  Previous strokes in the past still has pain in his left forearm wishes further evaluations of this.  Patient does go to a pain management center and he is actually on high-dose gabapentin and Lyrica at the same time.  [name] not on any opiates.  Patient states his asthma trigger primarily is that of cold weather.    Past Medical History:  Diagnosis Date  . Coronary artery disease   . Diabetes mellitus without complication ([place])   . Hypertension      Family History  Problem Relation Age of Onset  . ALS Mother   . Lung cancer Father   . ALS Maternal Aunt   . Heart disease Paternal Grandmother   . Heart disease Paternal Grandfather      Social History   Socioeconomic History  . Marital status: Married  Spouse name: Not on file  . Number of children: 2  . Years of education: 15  . Highest education level: Not on file  Occupational History  . Occupation: disablitiy  Tobacco Use  . Smoking status: Never Smoker  . Smokeless tobacco: Never Used  Vaping Use  . Vaping Use: Never used  Substance and Sexual Activity  . Alcohol use: Not Currently  . Drug use: Never  . Sexual activity: Yes  Other Topics Concern  . Not on file  Social History Narrative   Wife and patient relocated to [place] from [place] a few months ago. - 06/11/18   He is on disability since 2019.     He  lives with wife and daughter in a one-level apartment.    Right handed   Social Determinants of Health   Financial Resource Strain: Not on file  Food Insecurity: Not on file  Transportation Needs: Not on file  Physical Activity: Not on file  Stress: Not on file  Social Connections: Not on file  Intimate Partner Violence: Not on file     Allergies  Allergen Reactions  . Ambien [Zolpidem Tartrate] Other (See Comments)    HALLUCINATIONS  . Shellfish Allergy Anaphylaxis    Seafood     Outpatient Medications Prior to Visit  Medication Sig Dispense Refill  . albuterol (PROAIR HFA) 108 (90 Base) MCG/ACT inhaler Inhale 2 puffs into the lungs every 6 (six) hours as needed for wheezing or shortness of breath. 1 each 11  . aspirin EC 81 MG tablet Take 81 mg by mouth daily.    . cetirizine (ZYRTEC) 10 MG tablet Take 1 tablet (10 mg total) by mouth daily. 30 tablet 11  . Continuous Blood Gluc Receiver (FREESTYLE LIBRE 14 DAY READER) DEVI 1 Device by Does not apply route as directed. 1 each 0  . Continuous Blood Gluc Sensor (FREESTYLE LIBRE 14 DAY SENSOR) MISC 1 Device by Does not apply route as directed. 2 each 11  . dicyclomine (BENTYL) 20 MG tablet Take 1 tablet (20 mg total) by mouth 4 (four) times daily as needed (abdominal cramping). 120 tablet 0  . gabapentin (NEURONTIN) 600 MG tablet Take 6 tablets by mouth at bedtime.    [name] glucose blood test strip 4x daily 150 each 12  . metoprolol succinate (TOPROL-XL) 100 MG 24 hr tablet TAKE 1 TABLET BY MOUTH EVERY DAY 180 tablet 2  . nitroGLYCERIN (NITROSTAT) 0.4 MG SL tablet PLACE 1 TABLET (0.4 MG TOTAL) UNDER THE TONGUE EVERY 5 (FIVE) MINUTES AS NEEDED FOR CHEST PAIN. 25 tablet 3  . nortriptyline (PAMELOR) 50 MG capsule Take 1 capsule (50 mg total) by mouth at bedtime. 90 capsule 2  . pregabalin (LYRICA) 300 MG capsule Take 300 mg by mouth 2 (two) times daily.    [name] atorvastatin (LIPITOR) 80 MG tablet Take 1 tablet (80 mg total) by mouth daily. 90  tablet 3  . dapagliflozin propanediol (FARXIGA) 10 MG TABS tablet Take 1 tablet (10 mg total) by mouth daily. 90 tablet 3  . fluticasone (FLOVENT DISKUS) 50 MCG/BLIST diskus inhaler Inhale 1 puff into the lungs 2 (two) times daily. 60 each 11  . insulin glargine (LANTUS SOLOSTAR) 100 UNIT/ML Solostar Pen Inject 36 Units into the skin daily. 30 mL 3  . insulin lispro (HUMALOG KWIKPEN) 100 UNIT/ML KwikPen Inject 20 Units into the skin 3 (three) times daily with meals. Max dosage 60 units daily. DX E11.65 60 mL 1  . Insulin Pen Needle 30G  X 5 MM MISC 1 Device by Does not apply route as directed. 150 each 11  . metFORMIN (GLUCOPHAGE) 1000 MG tablet Take 1 tablet (1,000 mg total) by mouth 2 (two) times daily with a meal. 180 tablet 3  . montelukast (SINGULAIR) 10 MG tablet Take 10 mg by mouth daily.    . nitroGLYCERIN (NITRODUR - DOSED IN MG/24 HR) 0.4 mg/hr patch PLACE 1 PATCH (0.4 MG TOTAL) ONTO THE SKIN DAILY. 90 patch 2  . NOVOLOG FLEXPEN 100 UNIT/ML FlexPen Inject into the skin.    . ramipril (ALTACE) 5 MG capsule TAKE 2 CAPSULES (10 MG TOTAL) BY MOUTH DAILY. 180 capsule 3  . levETIRAcetam (KEPPRA) 500 MG tablet Take by mouth. (Patient not taking: Reported on 10/21/2020)    . QUEtiapine (SEROQUEL) 200 MG tablet Take by mouth. (Patient not taking: Reported on 10/21/2020)     No facility-administered medications prior to visit.      Review of Systems  HENT: Negative for congestion.   Eyes: Negative.   Respiratory: Positive for shortness of breath and wheezing. Negative for cough, choking and chest tightness.   Gastrointestinal: Positive for abdominal pain.       Hx gerd, now better  Endocrine: Positive for polyuria.  Genitourinary: Negative.   Psychiatric/Behavioral: Negative for dysphoric mood, self-injury and suicidal ideas. The patient is not nervous/anxious.        Objective:   Physical Exam No exam this is a video visit Patient is examined on the video and is no distress he is in  his bedroom  All previous notes are reviewed in [place] link along with lab data     Assessment & Plan:  I personally reviewed all images and lab data in the [facility] system as well as any outside material available during this office visit and agree with the  radiology impressions.   Essential hypertension Hypertension by history under reasonable control we will get this patient in for a direct office exam to follow-up and continue current blood pressure medications  Asthma, moderate persistent Moderate persistent asthma will switch over to [facility] from Flovent discus and dose will be 2 puffs twice daily will also continue albuterol as prescribed  Atelectasis Atelectasis on chest x-ray appears to be stable and mild  Obstructive sleep apnea Sleep apnea by history we will refer to sleep medicine patient is not on CPAP  Diabetic foot ulcer ([place]) Followed by podiatry appears to be resolved we will follow-up when I see him with a direct exam  Diabetic polyneuropathy associated with type 2 diabetes mellitus ([place]) Severe polyneuropathy with severe pain in the lower extremities and left upper arm  Management per pain clinic  Type 2 diabetes mellitus with diabetic polyneuropathy, with long-term current use of insulin ([place]) Type 2 diabetes with severe complications of polyneuropathy  Management per endocrinology  Refill insulin products Refill Farxiga and Metformin  Cerebral infarction ([place]) History of stroke patient to continue atorvastatin and aspirin therapy  Dyslipidemia, goal LDL below 70 Patient at goal in 2020 will need follow-up lipid panel  Left arm pain Chronic left arm pain I am concerned this may be related to neuropathy  Anxiety with depression Continue nortriptyline   Diagnoses and all orders for this visit:  Obstructive sleep apnea -     Ambulatory referral to Pulmonology  Type 2 diabetes mellitus with diabetic polyneuropathy, with long-term current use of  insulin (HCC) -     insulin glargine (LANTUS SOLOSTAR) 100 UNIT/ML Solostar Pen; Inject 36  Units into the skin daily.  Essential hypertension  Moderate persistent asthma without complication  Atelectasis  Diabetic ulcer of toe of right foot associated with type 2 diabetes mellitus, unspecified ulcer stage (HCC)  Diabetic polyneuropathy associated with type 2 diabetes mellitus (HCC)  Cerebral infarction, unspecified mechanism (HCC)  Dyslipidemia, goal LDL below 70  Left arm pain  Anxiety with depression  Other orders -     atorvastatin (LIPITOR) 80 MG tablet; Take 1 tablet (80 mg total) by mouth daily. -     dapagliflozin propanediol (FARXIGA) 10 MG TABS tablet; Take 1 tablet (10 mg total) by mouth daily. -     Discontinue: insulin lispro (HUMALOG KWIKPEN) 100 UNIT/ML KwikPen; Inject 20 Units into the skin 3 (three) times daily with meals. Max dosage 60 units daily. DX E11.65 -     Insulin Pen Needle 30G X 5 MM MISC; 1 Device by Does not apply route as directed. -     metFORMIN (GLUCOPHAGE) 1000 MG tablet; Take 1 tablet (1,000 mg total) by mouth 2 (two) times daily with a meal. -     nitroGLYCERIN (NITRODUR - DOSED IN MG/24 HR) 0.4 mg/hr patch; Place 1 patch (0.4 mg total) onto the skin daily. -     montelukast (SINGULAIR) 10 MG tablet; Take 1 tablet (10 mg total) by mouth daily. -     ramipril (ALTACE) 5 MG capsule; TAKE 2 CAPSULES (10 MG TOTAL) BY MOUTH DAILY. -     insulin aspart (NOVOLOG FLEXPEN) 100 UNIT/ML FlexPen; Inject 20 Units into the skin 3 (three) times daily with meals.   Follow Up Instructions: Patient will have a follow-up visit with me in the next few weeks and a sooner visit with clinical pharmacy Will also refer this patient to sleep medicine for evaluation of sleep apnea   I discussed the assessment and treatment plan with the patient. The patient was provided an opportunity to ask questions and all were answered. The patient agreed with the plan and  demonstrated an understanding of the instructions.   The patient was advised to call back or seek an in-person evaluation if the symptoms worsen or if the condition fails to improve as anticipated.  I provided 27mnutes of non-face-to-face time during this encounter  including  median intraservice time , review of notes, labs, imaging, medications  and explaining diagnosis and management to the patient .    PAsencion Noble MD

## 2020-10-21 NOTE — Assessment & Plan Note (Signed)
History of stroke patient to continue atorvastatin and aspirin therapy

## 2020-10-21 NOTE — Assessment & Plan Note (Signed)
Followed by podiatry appears to be resolved we will follow-up when I see him with a direct exam

## 2020-10-21 NOTE — Assessment & Plan Note (Signed)
Chronic left arm pain I am concerned this may be related to neuropathy

## 2020-10-21 NOTE — Assessment & Plan Note (Signed)
Sleep apnea by history we will refer to sleep medicine patient is not on CPAP

## 2020-10-21 NOTE — Assessment & Plan Note (Signed)
Atelectasis on chest x-ray appears to be stable and mild

## 2020-10-21 NOTE — Assessment & Plan Note (Signed)
Severe polyneuropathy with severe pain in the lower extremities and left upper arm  Management per pain clinic

## 2020-10-21 NOTE — Assessment & Plan Note (Signed)
Hypertension by history under reasonable control we will get this patient in for a direct office exam to follow-up and continue current blood pressure medications

## 2020-10-21 NOTE — Assessment & Plan Note (Signed)
Continue nortriptyline

## 2020-10-22 LAB — BASIC METABOLIC PANEL
BUN/Creatinine Ratio: 8 — ABNORMAL LOW (ref 9–20)
BUN: 6 mg/dL (ref 6–24)
CO2: 21 mmol/L (ref 20–29)
Calcium: 9.7 mg/dL (ref 8.7–10.2)
Chloride: 102 mmol/L (ref 96–106)
Creatinine, Ser: 0.78 mg/dL (ref 0.76–1.27)
Glucose: 271 mg/dL — ABNORMAL HIGH (ref 65–99)
Potassium: 4.2 mmol/L (ref 3.5–5.2)
Sodium: 141 mmol/L (ref 134–144)
eGFR: 108 mL/min/{1.73_m2} (ref >59)

## 2020-10-22 LAB — CBC
Hematocrit: 44 % (ref 37.5–51.0)
Hemoglobin: 14 g/dL (ref 13.0–17.7)
MCH: 28.3 pg (ref 26.6–33.0)
MCHC: 31.8 g/dL (ref 31.5–35.7)
MCV: 89 fL (ref 79–97)
Platelets: 319 10*3/uL (ref 150–450)
RBC: 4.94 x10E6/uL (ref 4.14–5.80)
RDW: 11.9 % (ref 11.6–15.4)
WBC: 7.2 10*3/uL (ref 3.4–10.8)

## 2020-11-03 ENCOUNTER — Ambulatory Visit: Payer: Medicare Other | Attending: Critical Care Medicine | Admitting: Critical Care Medicine

## 2020-11-03 ENCOUNTER — Other Ambulatory Visit: Payer: Self-pay | Admitting: Pharmacy Technician

## 2020-11-03 ENCOUNTER — Other Ambulatory Visit: Payer: Self-pay

## 2020-11-03 ENCOUNTER — Encounter: Payer: Self-pay | Admitting: Critical Care Medicine

## 2020-11-03 VITALS — BP 129/86 | HR 109 | Resp 16 | Wt 266.2 lb

## 2020-11-03 DIAGNOSIS — E1142 Type 2 diabetes mellitus with diabetic polyneuropathy: Secondary | ICD-10-CM | POA: Diagnosis not present

## 2020-11-03 DIAGNOSIS — Z794 Long term (current) use of insulin: Secondary | ICD-10-CM | POA: Diagnosis not present

## 2020-11-03 DIAGNOSIS — I2510 Atherosclerotic heart disease of native coronary artery without angina pectoris: Secondary | ICD-10-CM | POA: Diagnosis not present

## 2020-11-03 DIAGNOSIS — Z23 Encounter for immunization: Secondary | ICD-10-CM | POA: Diagnosis not present

## 2020-11-03 DIAGNOSIS — E11630 Type 2 diabetes mellitus with periodontal disease: Secondary | ICD-10-CM

## 2020-11-03 DIAGNOSIS — I5020 Unspecified systolic (congestive) heart failure: Secondary | ICD-10-CM | POA: Insufficient documentation

## 2020-11-03 DIAGNOSIS — I10 Essential (primary) hypertension: Secondary | ICD-10-CM | POA: Diagnosis not present

## 2020-11-03 DIAGNOSIS — I5042 Chronic combined systolic (congestive) and diastolic (congestive) heart failure: Secondary | ICD-10-CM

## 2020-11-03 DIAGNOSIS — Z8673 Personal history of transient ischemic attack (TIA), and cerebral infarction without residual deficits: Secondary | ICD-10-CM

## 2020-11-03 DIAGNOSIS — Z1159 Encounter for screening for other viral diseases: Secondary | ICD-10-CM | POA: Diagnosis not present

## 2020-11-03 DIAGNOSIS — Z9861 Coronary angioplasty status: Secondary | ICD-10-CM

## 2020-11-03 DIAGNOSIS — E669 Obesity, unspecified: Secondary | ICD-10-CM

## 2020-11-03 DIAGNOSIS — G63 Polyneuropathy in diseases classified elsewhere: Secondary | ICD-10-CM

## 2020-11-03 DIAGNOSIS — E785 Hyperlipidemia, unspecified: Secondary | ICD-10-CM

## 2020-11-03 DIAGNOSIS — R000 Tachycardia, unspecified: Secondary | ICD-10-CM

## 2020-11-03 DIAGNOSIS — G5623 Lesion of ulnar nerve, bilateral upper limbs: Secondary | ICD-10-CM

## 2020-11-03 DIAGNOSIS — K029 Dental caries, unspecified: Secondary | ICD-10-CM

## 2020-11-03 DIAGNOSIS — E66811 Obesity, class 1: Secondary | ICD-10-CM

## 2020-11-03 LAB — GLUCOSE, POCT (MANUAL RESULT ENTRY): POC Glucose: 260 mg/dl — AB (ref 70–99)

## 2020-11-03 MED ORDER — LANTUS SOLOSTAR 100 UNIT/ML ~~LOC~~ SOPN: 45.0000 [IU] | PEN_INJECTOR | Freq: Every day | SUBCUTANEOUS | 3 refills | Status: DC

## 2020-11-03 NOTE — Progress Notes (Signed)
Subjective:    Patient ID: Austin Vaughan, male    DOB: 09/04/1970, 50 y.o.   MRN: [id number] . 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  This is a former primary care patient of Dr. Chapman Fitch transitioning care now to my service.  Patient was seen in the mobile medicine unit in February and given a prescription for cetirizine Flovent discus.  Follow-up CT of the chest had been done because of abnormal chest x-ray showing platelike mild atelectasis left mid and lower lung zone.  Patient also had a referral for sleep studies however this has not been completed.  Patient is needing refills on multiple medications.  He does have an endocrinologist as an upcoming appointment.  He is on insulin glargine 36 units at bedtime and insulin NovoLog 20 units 3 times daily.  Patient has a continuous glucose readout.  Blood sugars been running anywhere from 110-300 and his last A1c was 9.3.  He is due up an eye exam.  Patient was seen by cardiology they ordered a CBC and a be met which is yet to result.  He complains of chronic abdominal pain and has been followed by gastroenterology is felt to have gastroparesis and is on Bentyl for this.  With his asthma he is on the Flovent 1 puff twice a day and the albuterol as needed using albuterol excessively at this time.  Follows with podiatry was just seen by podiatry recently and had his toenails clipped.  His diabetic foot ulcer has resolved.  He is followed by neurology at [facility] for severe polyneuropathy and had ALS rule out because there is a family history of ALS.  Previous strokes in the past still has pain in his left forearm wishes further evaluations of this.  Patient does go  to a pain management center and he is actually on high-dose gabapentin and Lyrica at the same time.  [name] not on any opiates.  Patient states his asthma trigger primarily is that of cold weather.  11/03/2020 Patient seen today in return follow-up previous visit was on video.  Patient is a former Dr. Chapman Fitch patient here now to transfer care to me for primary care.  Patient has severe type 2 diabetes insulin-dependent with multiple comorbidities.  He has severe diabetic neuropathy with involvement of chronic left arm pain and numbness in both feet previous diabetic ulcer in the right foot which is now healed.  Patient also history of coronary disease poor dentition previous stroke sleep apnea hypertension hyperlipidemia obesity  Patient's blood sugars at home and range anywhere from the low 142 up to 200 he does have the continuous monitoring glucose device.  He does not eat breakfast or lunch he sleeps late into the day takes frequent naps eats rice this white rice with beans with a more of a Lactinex type style food at night with including fried foods.  He does not take his NovoLog when he skips meals.  He is post been on 20 units 3 times a day with meals but is only getting 1 dose 20 units at night he is on 36 units of insulin Lantus and has the [place]  Patient denies any shortness of breath or other new complaints on arrival  blood pressure is 129/86 and blood glucose was 260. Patient has upcoming visits with pulmonary for sleep apnea evaluation note he does have Medicaid Medicare  Past Medical History:  Diagnosis Date  . Coronary artery disease   . Diabetes mellitus without complication ([place])   . Diabetic foot ulcer ([place]) 08/23/2019  . Hypertension      Family History  Problem Relation Age of Onset  . ALS Mother   . Lung cancer Father   . ALS Maternal Aunt   . Heart disease Paternal Grandmother   . Heart disease Paternal Grandfather      Social History   Socioeconomic History  .  Marital status: Married    Spouse name: Not on file  . Number of children: 2  . Years of education: 80  . Highest education level: Not on file  Occupational History  . Occupation: disablitiy  Tobacco Use  . Smoking status: Never Smoker  . Smokeless tobacco: Never Used  Vaping Use  . Vaping Use: Never used  Substance and Sexual Activity  . Alcohol use: Not Currently  . Drug use: Never  . Sexual activity: Yes  Other Topics Concern  . Not on file  Social History Narrative   Wife and patient relocated to [place] from [place] a few months ago. - 06/11/18   He is on disability since 2019.     He lives with wife and daughter in a one-level apartment.    Right handed   Social Determinants of Health   Financial Resource Strain: Not on file  Food Insecurity: Not on file  Transportation Needs: Not on file  Physical Activity: Not on file  Stress: Not on file  Social Connections: Not on file  Intimate Partner Violence: Not on file     Allergies  Allergen Reactions  . Ambien [Zolpidem Tartrate] Other (See Comments)    HALLUCINATIONS  . Shellfish Allergy Anaphylaxis    Seafood     Outpatient Medications Prior to Visit  Medication Sig Dispense Refill  . albuterol (PROAIR HFA) 108 (90 Base) MCG/ACT inhaler Inhale 2 puffs into the lungs every 6 (six) hours as needed for wheezing or shortness of breath. 1 each 11  . aspirin EC 81 MG tablet Take 81 mg by mouth daily.    [name] atorvastatin (LIPITOR) 80 MG tablet Take 1 tablet (80 mg total) by mouth daily. 90 tablet 3  . cetirizine (ZYRTEC) 10 MG tablet Take 1 tablet (10 mg total) by mouth daily. 30 tablet 11  . Continuous Blood Gluc Receiver (FREESTYLE LIBRE 14 DAY READER) DEVI 1 Device by Does not apply route as directed. 1 each 0  . Continuous Blood Gluc Sensor (FREESTYLE LIBRE 14 DAY SENSOR) MISC 1 Device by Does not apply route as directed. 2 each 11  . dapagliflozin propanediol (FARXIGA) 10 MG TABS tablet Take 1 tablet (10 mg total)  by mouth daily. 90 tablet 3  . dicyclomine (BENTYL) 20 MG tablet Take 1 tablet (20 mg total) by mouth 4 (four) times daily as needed (abdominal cramping). 120 tablet 0  . gabapentin (NEURONTIN) 600 MG tablet Take 6 tablets by mouth at bedtime.    [name] glucose blood test strip 4x daily 150 each 12  . insulin aspart (NOVOLOG FLEXPEN) 100 UNIT/ML FlexPen Inject 20 Units into the skin 3 (three) times daily with meals. 9 each 1  . Insulin Pen Needle 30G X 5 MM MISC 1 Device by Does not apply route as directed. 150 each 11  .  metFORMIN (GLUCOPHAGE) 1000 MG tablet Take 1 tablet (1,000 mg total) by mouth 2 (two) times daily with a meal. 180 tablet 3  . metoprolol succinate (TOPROL-XL) 100 MG 24 hr tablet TAKE 1 TABLET BY MOUTH EVERY DAY 180 tablet 2  . montelukast (SINGULAIR) 10 MG tablet Take 1 tablet (10 mg total) by mouth daily. 60 tablet 2  . nitroGLYCERIN (NITRODUR - DOSED IN MG/24 HR) 0.4 mg/hr patch Place 1 patch (0.4 mg total) onto the skin daily. 90 patch 2  . nitroGLYCERIN (NITROSTAT) 0.4 MG SL tablet PLACE 1 TABLET (0.4 MG TOTAL) UNDER THE TONGUE EVERY 5 (FIVE) MINUTES AS NEEDED FOR CHEST PAIN. 25 tablet 3  . nortriptyline (PAMELOR) 50 MG capsule Take 1 capsule (50 mg total) by mouth at bedtime. 90 capsule 2  . pregabalin (LYRICA) 300 MG capsule Take 300 mg by mouth 2 (two) times daily.    . ramipril (ALTACE) 5 MG capsule TAKE 2 CAPSULES (10 MG TOTAL) BY MOUTH DAILY. 180 capsule 3  . insulin glargine (LANTUS SOLOSTAR) 100 UNIT/ML Solostar Pen Inject 36 Units into the skin daily. 42 each 3   No facility-administered medications prior to visit.      Review of Systems  HENT: Negative for congestion.   Eyes: Negative.   Respiratory: Positive for shortness of breath and wheezing. Negative for cough, choking and chest tightness.   Gastrointestinal: Positive for abdominal pain.       Hx gerd, now better  Endocrine: Positive for polyuria.  Genitourinary: Negative.   Psychiatric/Behavioral:  Negative for dysphoric mood, self-injury and suicidal ideas. The patient is not nervous/anxious.        Objective:   Physical Exam Vitals:   11/03/20 0939  BP: 129/86  Pulse: (!) 109  Resp: 16  SpO2: 97%  Weight: 266 lb 3.2 oz (120.7 kg)    Gen: Pleasant, obese in no distress,  normal affect  ENT: No lesions,  mouth clear,  oropharynx clear, no postnasal drip, poor dentition multiple cavities severe periodontal disease  Neck: No JVD, no TMG, no carotid bruits  Lungs: No use of accessory muscles, no dullness to percussion, clear without rales or rhonchi  Cardiovascular: RRR, heart sounds normal, no murmur or gallops, no peripheral edema  Abdomen: soft and NT, no HSM,  BS normal  Musculoskeletal: No deformities, no cyanosis or clubbing  Neuro: alert, non focal  Skin: Warm, no lesions or rashes  Foot exam done severe neuropathy with essentially no sensation from the mid tibia down through the feet bilaterally no lesions seen circulation good      Assessment & Plan:  I personally reviewed all images and lab data in the [facility] system as well as any outside material available during this office visit and agree with the  radiology impressions.   Type 2 diabetes mellitus with diabetic polyneuropathy, with long-term current use of insulin (HCC) Type 2 diabetes severe diabetic polyneuropathy not taking his NovoLog regularly because he misses breakfast and lunch and only eats dinner and eats fried foods and rice and beans for dinner  I had a long conversation with the patient today relative to the fact that we need to have him focus on his diet I will send with a medical nutritionist for consultation    Essential hypertension Blood pressure at goal no changes  History of stroke History of stroke continue cholesterol therapy follow-up neurology  Ulnar neuropathy of both upper extremities Persistent ulnar neuropathy exacerbated by diabetes  Obesity (BMI 30.0-34.9) Referral to  nutrition therapy given  Dyslipidemia, goal LDL below 70 Follow-up lipid panel  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  Diagnoses and all orders for this visit:  Type 2 diabetes mellitus with diabetic polyneuropathy, with long-term current use of insulin (HCC) -     POCT glucose (manual entry) -     Amb ref to Medical Nutrition Therapy-MNT -     insulin glargine (LANTUS SOLOSTAR) 100 UNIT/ML Solostar Pen; Inject 45 Units into the skin daily. 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

## 2020-11-03 NOTE — Assessment & Plan Note (Signed)
Follow-up lipid panel 

## 2020-11-03 NOTE — Assessment & Plan Note (Signed)
Type 2 diabetes severe diabetic polyneuropathy not taking his NovoLog regularly because he misses breakfast and lunch and only eats dinner and eats fried foods and rice and beans for dinner  I had a long conversation with the patient today relative to the fact that we need to have him focus on his diet I will send with a medical nutritionist for consultation

## 2020-11-03 NOTE — Assessment & Plan Note (Signed)
Referral to nutrition therapy given

## 2020-11-03 NOTE — Assessment & Plan Note (Signed)
Compensated heart failure no changes  

## 2020-11-03 NOTE — Assessment & Plan Note (Signed)
Persistent ulnar neuropathy exacerbated by diabetes

## 2020-11-03 NOTE — Assessment & Plan Note (Signed)
History of stroke continue cholesterol therapy follow-up neurology

## 2020-11-03 NOTE — Assessment & Plan Note (Signed)
Blood pressure at goal no changes ?

## 2020-11-03 NOTE — Assessment & Plan Note (Signed)
Referral to dentist given

## 2020-11-03 NOTE — Addendum Note (Signed)
Addended by: Lois Huxley, Jeannett Senior L on: 11/03/2020 01:38 PM   Modules accepted: Orders

## 2020-11-03 NOTE — Assessment & Plan Note (Signed)
Referral to dentistry 

## 2020-11-03 NOTE — Patient Instructions (Addendum)
Keep June appointment with your neurologist  You need to eat 3 regular meals a day breakfast lunch and dinner  Referral to medical nutritionist is made  Blood work today's metabolic panel and hepatitis C screen  Flu vaccine and Prevnar 13 pneumonia vaccine were given  Thank you for having your COVID vaccine series completed  Blood pressure was under good control today  Return to see Dr. Delford Field in 2 months, this will be a video visit   Pneumococcal Conjugate Vaccine (PCV13): What You Need to Know 1. Why get vaccinated? Pneumococcal conjugate vaccine (PCV13) can prevent pneumococcal disease. Pneumococcal disease refers to any illness caused by pneumococcal bacteria. These bacteria can cause many types of illnesses, including pneumonia, which is an infection of the lungs. Pneumococcal bacteria are one of the most common causes of pneumonia. Besides pneumonia, pneumococcal bacteria can also cause:  Ear infections  Sinus infections  Meningitis (infection of the tissue covering the brain and spinal cord)  Bacteremia (infection of the blood) Anyone can get pneumococcal disease, but children under 84 years old, people with certain medical conditions, adults 65 years or older, and cigarette smokers are at the highest risk. Most pneumococcal infections are mild. However, some can result in long-term problems, such as brain damage or hearing loss. Meningitis, bacteremia, and pneumonia caused by pneumococcal disease can be fatal. 2. PCV13 PCV13 protects against 13 types of bacteria that cause pneumococcal disease. Infants and young children usually need 4 doses of pneumococcal conjugate vaccine, at ages 22, 8, 65, and 12-15 months. Older children (through age 25 months) may be vaccinated if they did not receive the recommended doses. A dose of PCV13 is also recommended for adults and children 6 years or older with certain medical conditions if they did not already receive PCV13. This vaccine may  be given to healthy adults 65 years or older who did not already receive PCV13, based on discussions between the patient and health care provider. 3. Talk with your health care provider Tell your vaccination provider if the person getting the vaccine:  Has had an allergic reaction after a previous dose of PCV13, to an earlier pneumococcal conjugate vaccine known as PCV7, or to any vaccine containing diphtheria toxoid (for example, DTaP), or has any severe, life-threatening allergies In some cases, your health care provider may decide to postpone PCV13 vaccination until a future visit. People with minor illnesses, such as a cold, may be vaccinated. People who are moderately or severely ill should usually wait until they recover before getting PCV13. Your health care provider can give you more information. 4. Risks of a vaccine reaction  Redness, swelling, pain, or tenderness where the shot is given, and fever, loss of appetite, fussiness (irritability), feeling tired, headache, and chills can happen after PCV13 vaccination. Young children may be at increased risk for seizures caused by fever after PCV13 if it is administered at the same time as inactivated influenza vaccine. Ask your health care provider for more information. People sometimes faint after medical procedures, including vaccination. Tell your provider if you feel dizzy or have vision changes or ringing in the ears. As with any medicine, there is a very remote chance of a vaccine causing a severe allergic reaction, other serious injury, or death. 5. What if there is a serious problem? An allergic reaction could occur after the vaccinated person leaves the clinic. If you see signs of a severe allergic reaction (hives, swelling of the face and throat, difficulty breathing, a fast heartbeat, dizziness,  or weakness), call 9-1-1 and get the person to the nearest hospital. For other signs that concern you, call your health care  provider. Adverse reactions should be reported to the Vaccine Adverse Event Reporting System (VAERS). Your health care provider will usually file this report, or you can do it yourself. Visit the VAERS website at www.vaers.LAgents.no or call 702 598 8633. VAERS is only for reporting reactions, and VAERS staff members do not give medical advice. 6. The National Vaccine Injury Compensation Program The [employer] Vaccine Injury Compensation Program (VICP) is a federal program that was created to compensate people who may have been injured by certain vaccines. Claims regarding alleged injury or death due to vaccination have a time limit for filing, which may be as short as two years. Visit the VICP website at SpiritualWord.at or call 650-417-3342 to learn about the program and about filing a claim. 7. How can I learn more?  Ask your health care provider.  Call your local or state health department.  Visit the website of the Food and Drug Administration (FDA) for vaccine package inserts and additional information at FinderList.no.  Contact the Centers for Disease Control and Prevention (CDC): ? Call (613) 002-6029 (1-800-CDC-INFO) or ? Visit CDC's website at PicCapture.uy. Vaccine Information Statement PCV13 (03/19/2020) This information is not intended to replace advice given to you by your health care provider. Make sure you discuss any questions you have with your health care provider. Document Revised: 05/06/2020 Document Reviewed: 05/06/2020 Elsevier Patient Education  2021 Elsevier Inc.   Influenza Virus Vaccine injection (Fluarix) What is this medicine? INFLUENZA VIRUS VACCINE (in floo EN zuh VAHY ruhs vak SEEN) helps to reduce the risk of getting influenza also known as the flu. This medicine may be used for other purposes; ask your health care provider or pharmacist if you have questions. COMMON BRAND NAME(S): Fluarix, Fluzone What  should I tell my health care provider before I take this medicine? They need to know if you have any of these conditions:  bleeding disorder like hemophilia  fever or infection  Guillain-Barre syndrome or other neurological problems  immune system problems  infection with the human immunodeficiency virus (HIV) or AIDS  low blood platelet counts  multiple sclerosis  an unusual or allergic reaction to influenza virus vaccine, eggs, chicken proteins, latex, gentamicin, other medicines, foods, dyes or preservatives  pregnant or trying to get pregnant  breast-feeding How should I use this medicine? This vaccine is for injection into a muscle. It is given by a health care professional. A copy of Vaccine Information Statements will be given before each vaccination. Read this sheet carefully each time. The sheet may change frequently. Talk to your pediatrician regarding the use of this medicine in children. Special care may be needed. Overdosage: If you think you have taken too much of this medicine contact a poison control center or emergency room at once. NOTE: This medicine is only for you. Do not share this medicine with others. What if I miss a dose? This does not apply. What may interact with this medicine?  chemotherapy or radiation therapy  medicines that lower your immune system like etanercept, anakinra, infliximab, and adalimumab  medicines that treat or prevent blood clots like warfarin  phenytoin  steroid medicines like prednisone or cortisone  theophylline  vaccines This list may not describe all possible interactions. Give your health care provider a list of all the medicines, herbs, non-prescription drugs, or dietary supplements you use. Also tell them if you smoke, drink alcohol,  or use illegal drugs. Some items may interact with your medicine. What should I watch for while using this medicine? Report any side effects that do not go away within 3 days to your  doctor or health care professional. Call your health care provider if any unusual symptoms occur within 6 weeks of receiving this vaccine. You may still catch the flu, but the illness is not usually as bad. You cannot get the flu from the vaccine. The vaccine will not protect against colds or other illnesses that may cause fever. The vaccine is needed every year. What side effects may I notice from receiving this medicine? Side effects that you should report to your doctor or health care professional as soon as possible:  allergic reactions like skin rash, itching or hives, swelling of the face, lips, or tongue Side effects that usually do not require medical attention (report to your doctor or health care professional if they continue or are bothersome):  fever  headache  muscle aches and pains  pain, tenderness, redness, or swelling at site where injected  weak or tired This list may not describe all possible side effects. Call your doctor for medical advice about side effects. You may report side effects to FDA at 1-800-FDA-1088. Where should I keep my medicine? This vaccine is only given in a clinic, pharmacy, doctor's office, or other health care setting and will not be stored at home. NOTE: This sheet is a summary. It may not cover all possible information. If you have questions about this medicine, talk to your doctor, pharmacist, or health care provider.  2021 Elsevier/Gold Standard (2008-02-26 09:30:40)

## 2020-11-04 LAB — HEPATIC FUNCTION PANEL
ALT: 40 IU/L (ref 0–44)
AST: 17 IU/L (ref 0–40)
Albumin: 4.3 g/dL (ref 3.8–4.9)
Alkaline Phosphatase: 112 IU/L (ref 44–121)
Bilirubin Total: 0.5 mg/dL (ref 0.0–1.2)
Bilirubin, Direct: 0.18 mg/dL (ref 0.00–0.40)
Total Protein: 6.9 g/dL (ref 6.0–8.5)

## 2020-11-04 LAB — HCV AB W REFLEX TO QUANT PCR: HCV Ab: <0.1 s/co ratio (ref 0.0–0.9)

## 2020-11-04 LAB — HCV INTERPRETATION

## 2020-11-05 ENCOUNTER — Ambulatory Visit: Payer: Medicare Other | Admitting: Neurology

## 2020-11-12 ENCOUNTER — Ambulatory Visit: Payer: Medicare Other | Admitting: Neurology

## 2020-11-15 ENCOUNTER — Ambulatory Visit (INDEPENDENT_AMBULATORY_CARE_PROVIDER_SITE_OTHER): Payer: Medicare Other

## 2020-11-15 DIAGNOSIS — R000 Tachycardia, unspecified: Secondary | ICD-10-CM

## 2020-11-17 DIAGNOSIS — R000 Tachycardia, unspecified: Secondary | ICD-10-CM

## 2020-11-22 ENCOUNTER — Other Ambulatory Visit: Payer: Self-pay | Admitting: Cardiovascular Disease

## 2020-11-23 ENCOUNTER — Ambulatory Visit: Payer: Medicare Other | Admitting: Gastroenterology

## 2020-11-30 ENCOUNTER — Ambulatory Visit: Payer: Medicare Other | Admitting: Internal Medicine

## 2020-11-30 NOTE — Progress Notes (Deleted)
PATIENT IDENTIFIER: [name] is a 50 y.o. male with a past medical history of T2DM, HTN, , CVA and CAD. The patient has followed with Endocrinology clinic since 10/17/2018 for consultative assistance with management of his diabetes.  DIABETIC HISTORY:  Austin Vaughan was diagnosed with T2DM since 2013. He has tried Januvia in the past but was ineffective in controlling his hyperglycemia. He used to be on Metformin but unclear the reason for discontinuation.He has been on insulin ~ 2018. His hemoglobin A1c has ranged from  9.5% in 2020, peaking at 10.0% in 2019.   On his initial visit to our clinic his A1c was 9.5% . He was on an MDi regimen and Glipizide. We stopped Glipizide and started Metformin. By 01/2019 Austin Vaughan was started.   SUBJECTIVE:   During the last visit (04/07/2020): A1c 7.1% We continued metformin , Farxiga,  lantus and novolog    Today (11/30/2020): Austin Vaughan is here for a follow up on his diabetes management.  He checks his blood sugars 4 times daily, preprandial and bedtime. The patient has  had hypoglycemic episodes since the last clinic visit, one time last month it was 57 mg/dL      HOME DIABETES REGIMEN:   Lantus 38 units daily  HumaLog 18 units 3 times daily q. before meals  Metformin 1000 mg BID  Farxiga 10 mg daily        HISTORY:  Past Medical History:  Past Medical History:  Diagnosis Date  . Coronary artery disease   . Diabetes mellitus without complication ([place])   . Diabetic foot ulcer ([place]) 08/23/2019  . Hypertension    Past Surgical History:  Past Surgical History:  Procedure Laterality Date  . CORONARY ANGIOPLASTY WITH STENT PLACEMENT  07/2002    Social History:  reports that he has never smoked. He has never used smokeless tobacco. He reports previous alcohol use. He reports that he does not use drugs. Family History:  Family History  Problem Relation Age of Onset  . ALS Mother   . Lung cancer Father   . ALS Maternal Aunt   .  Heart disease Paternal Grandmother   . Heart disease Paternal Grandfather      HOME MEDICATIONS: Allergies as of 11/30/2020      Reactions   Ambien [zolpidem Tartrate] Other (See Comments)   HALLUCINATIONS   Shellfish Allergy Anaphylaxis   Seafood      Medication List       Accurate as of November 30, 2020 12:26 PM. If you have any questions, ask your nurse or doctor.        albuterol 108 (90 Base) MCG/ACT inhaler Commonly known as: ProAir HFA Inhale 2 puffs into the lungs every 6 (six) hours as needed for wheezing or shortness of breath.   aspirin EC 81 MG tablet Take 81 mg by mouth daily.   atorvastatin 80 MG tablet Commonly known as: LIPITOR Take 1 tablet (80 mg total) by mouth daily.   cetirizine 10 MG tablet Commonly known as: ZYRTEC Take 1 tablet (10 mg total) by mouth daily.   dapagliflozin propanediol 10 MG Tabs tablet Commonly known as: Farxiga Take 1 tablet (10 mg total) by mouth daily.   dicyclomine 20 MG tablet Commonly known as: BENTYL Take 1 tablet (20 mg total) by mouth 4 (four) times daily as needed (abdominal cramping).   FreeStyle Libre 14 Day Reader Kerrin Mo 1 Device by Does not apply route as directed.  FreeStyle Libre 14 Day Sensor Misc 1 Device by Does not apply route as directed.   gabapentin 600 MG tablet Commonly known as: NEURONTIN Take 6 tablets by mouth at bedtime.   glucose blood test strip 4x daily   Insulin Pen Needle 30G X 5 MM Misc 1 Device by Does not apply route as directed.   Lantus SoloStar 100 UNIT/ML Solostar Pen Generic drug: insulin glargine Inject 45 Units into the skin daily.   metFORMIN 1000 MG tablet Commonly known as: GLUCOPHAGE Take 1 tablet (1,000 mg total) by mouth 2 (two) times daily with a meal.   metoprolol succinate 100 MG 24 hr tablet Commonly known as: TOPROL-XL TAKE 1 TABLET BY MOUTH EVERY DAY   montelukast 10 MG tablet Commonly known as: SINGULAIR Take 1 tablet (10 mg total) by mouth daily.    nitroGLYCERIN 0.4 mg/hr patch Commonly known as: NITRODUR - Dosed in mg/24 hr Place 1 patch (0.4 mg total) onto the skin daily.   nitroGLYCERIN 0.4 MG SL tablet Commonly known as: NITROSTAT PLACE 1 TABLET UNDER THE TONGUE EVERY 5 MINUTES AS NEEDED FOR CHEST PAIN.   nortriptyline 50 MG capsule Commonly known as: PAMELOR Take 1 capsule (50 mg total) by mouth at bedtime.   NovoLOG FlexPen 100 UNIT/ML FlexPen Generic drug: insulin aspart Inject 20 Units into the skin 3 (three) times daily with meals.   pregabalin 300 MG capsule Commonly known as: LYRICA Take 300 mg by mouth 2 (two) times daily.   ramipril 5 MG capsule Commonly known as: ALTACE TAKE 2 CAPSULES (10 MG TOTAL) BY MOUTH DAILY.         DATA REVIEWED:  Lab Results  Component Value Date   HGBA1C 9.3 (A) 09/14/2020   HGBA1C 7.1 (A) 04/07/2020   HGBA1C 7.1 (H) 08/23/2019   Lab Results  Component Value Date   MICROALBUR 0.4 04/07/2020   LDLCALC 61 04/29/2019   CREATININE 0.78 10/12/2020    Results for Austin Vaughan (MRN [id number]) as of 11/30/2020 12:37  Ref. Range 10/12/2020 12:15 11/03/2020 10:24  Sodium Latest Ref Range: 134 - 144 mmol/L 141   Potassium Latest Ref Range: 3.5 - 5.2 mmol/L 4.2   Chloride Latest Ref Range: 96 - 106 mmol/L 102   CO2 Latest Ref Range: 20 - 29 mmol/L 21   Glucose Latest Ref Range: 65 - 99 mg/dL 271 (H)   BUN Latest Ref Range: 6 - 24 mg/dL 6   Creatinine Latest Ref Range: 0.76 - 1.27 mg/dL 0.78   Calcium Latest Ref Range: 8.7 - 10.2 mg/dL 9.7   BUN/Creatinine Ratio Latest Ref Range: 9 - 20  8 (L)   EGFR Latest Ref Range: >59 mL/min/1.73 108   Alkaline Phosphatase Latest Ref Range: 44 - 121 IU/L  112  Albumin Latest Ref Range: 3.8 - 4.9 g/dL  4.3  AST Latest Ref Range: 0 - 40 IU/L  17  ALT Latest Ref Range: 0 - 44 IU/L  40  Total Protein Latest Ref Range: 6.0 - 8.5 g/dL  6.9  Total Bilirubin Latest Ref Range: 0.0 - 1.2 mg/dL  0.5  BILIRUBIN, DIRECT Latest Ref Range: 0.00 -  0.40 mg/dL  0.18         ASSESSMENT / PLAN / RECOMMENDATIONS:   1) Type 2 Diabetes Mellitus, Poorly controlled, With neuropathic and macrovascular complications - Most recent A1c of 9.3 %. Goal A1c < 7.0 %.    - I have praised him on improved glycemic control - No side  effects to any medication at this time  - I have advised him to bring his CGM receiver on next visit    MEDICATIONS:  Metformin 1000 mg BID  Farxiga 10 mg daily   Lantus 36 units daily   Novolog 20 units TID QAC  EDUCATION / INSTRUCTIONS:  BG monitoring instructions: Patient is instructed to check his blood sugars 4 times a day, before meals and bedtime.  Call [facility] if: BG persistently < 70 . I reviewed the Rule of 15 for the treatment of hypoglycemia in detail with the patient. Literature supplied.    2) Diabetic complications:   Eye: Does not have known diabetic retinopathy.   Neuro/ Feet: Does have known diabetic peripheral neuropathy.  Renal: Patient does not have known baseline CKD. He is on an ACEI/ARB at present.Normal MA/cr ratio    Signed electronically by: Mack Guise, MD  [facility]  [facility] [address] [place], [place] [address] Phone: [phone] FAX: [phone]   CC: Elsie Stain, MD [address] [address] Phone: [phone]  Fax: [phone]  Return to Endocrinology clinic as below: Future Appointments  Date Time Provider [place]  11/30/2020  2:00 PM Anacaren Kohan, Melanie Crazier, MD [facility] [place]  12/16/2020  9:00 AM Clydell Hakim, RD [place] [facility]  02/07/2021  2:30 PM Narda Amber K, DO [facility] None

## 2020-12-16 ENCOUNTER — Ambulatory Visit: Payer: Medicare Other | Admitting: Dietician

## 2020-12-28 ENCOUNTER — Ambulatory Visit: Payer: Medicare Other | Admitting: Internal Medicine

## 2021-01-11 ENCOUNTER — Ambulatory Visit: Payer: Medicare Other | Admitting: Internal Medicine

## 2021-01-11 ENCOUNTER — Encounter: Payer: Self-pay | Admitting: Internal Medicine

## 2021-01-11 NOTE — Progress Notes (Deleted)
PATIENT IDENTIFIER: Mr. STEVENS MAGWOOD is a 50 y.o. male with a past medical history of T2DM, HTN, CAD. The patient has followed with Endocrinology clinic since 10/17/2018 for consultative assistance with management of his diabetes.  DIABETIC HISTORY:  Mr. Salsgiver was diagnosed with T2DM since 2013. He has tried Januvia in the past but was ineffective in controlling his hyperglycemia. He used to be on Metformin but unclear the reason for discontinuation.He has been on insulin ~ 2018. His hemoglobin A1c has ranged from  9.5% in 2020, peaking at 10.0% in 2019.   On his initial visit to our clinic his A1c was 9.5% . He was on an MDi regimen and Glipizide. We stopped Glipizide and started Metformin. By 01/2019 Marcelline Deist was started.   SUBJECTIVE:   During the last visit (04/10/2020): A1c 7.1%. We continued metformin , Farxiga,  lantus and novolog    Today (01/11/2021): Mr. Chien is here for a follow up on his diabetes management.  He checks his blood sugars 4 times daily, preprandial and bedtime. The patient has  had hypoglycemic episodes since the last clinic visit, one time last month it was 57 mg/dL      HOME DIABETES REGIMEN:   Lantus 38 units daily  HumaLog 18 units 3 times daily q. before meals  Metformin 1000 mg BID  Farxiga 10 mg daily        HISTORY:  Past Medical History:  Past Medical History:  Diagnosis Date  . Coronary artery disease   . Diabetes mellitus without complication ([facility])   . Diabetic foot ulcer ([facility]) 08/23/2019  . Hypertension    Past Surgical History:  Past Surgical History:  Procedure Laterality Date  . CORONARY ANGIOPLASTY WITH STENT PLACEMENT  07/2002    Social History:  reports that he has never smoked. He has never used smokeless tobacco. He reports previous alcohol use. He reports that he does not use drugs. Family History:  Family History  Problem Relation Age of Onset  . ALS Mother   . Lung cancer Father   . ALS Maternal Aunt   . Heart  disease Paternal Grandmother   . Heart disease Paternal Grandfather      HOME MEDICATIONS: Allergies as of 01/11/2021      Reactions   Ambien [zolpidem Tartrate] Other (See Comments)   HALLUCINATIONS   Shellfish Allergy Anaphylaxis   Seafood      Medication List       Accurate as of Jan 11, 2021 12:44 PM. If you have any questions, ask your nurse or doctor.        albuterol 108 (90 Base) MCG/ACT inhaler Commonly known as: ProAir HFA Inhale 2 puffs into the lungs every 6 (six) hours as needed for wheezing or shortness of breath.   aspirin EC 81 MG tablet Take 81 mg by mouth daily.   atorvastatin 80 MG tablet Commonly known as: LIPITOR TAKE 1 TABLET (80 MG TOTAL) BY MOUTH DAILY.   atorvastatin 80 MG tablet Commonly known as: LIPITOR Take 1 tablet (80 mg total) by mouth daily.   cetirizine 10 MG tablet Commonly known as: ZYRTEC Take 1 tablet (10 mg total) by mouth daily.   dapagliflozin propanediol 10 MG Tabs tablet Commonly known as: Farxiga Take 1 tablet (10 mg total) by mouth daily.   dicyclomine 20 MG tablet Commonly known as: BENTYL Take 1 tablet (20 mg total) by mouth 4 (four) times daily as needed (abdominal cramping).   FreeStyle  Libre 14 Day Reader Hardie Pulley 1 Device by Does not apply route as directed.   FreeStyle Libre 14 Day Sensor Misc 1 Device by Does not apply route as directed.   gabapentin 600 MG tablet Commonly known as: NEURONTIN Take 6 tablets by mouth at bedtime.   glucose blood test strip 4x daily   Insulin Pen Needle 30G X 5 MM Misc 1 Device by Does not apply route as directed.   Lantus SoloStar 100 UNIT/ML Solostar Pen Generic drug: insulin glargine Inject 45 Units into the skin daily.   metFORMIN 1000 MG tablet Commonly known as: GLUCOPHAGE Take 1 tablet (1,000 mg total) by mouth 2 (two) times daily with a meal.   metoprolol succinate 100 MG 24 hr tablet Commonly known as: TOPROL-XL TAKE 1 TABLET BY MOUTH EVERY DAY    montelukast 10 MG tablet Commonly known as: SINGULAIR Take 1 tablet (10 mg total) by mouth daily.   nitroGLYCERIN 0.4 mg/hr patch Commonly known as: NITRODUR - Dosed in mg/24 hr Place 1 patch (0.4 mg total) onto the skin daily.   nitroGLYCERIN 0.4 MG SL tablet Commonly known as: NITROSTAT PLACE 1 TABLET UNDER THE TONGUE EVERY 5 MINUTES AS NEEDED FOR CHEST PAIN.   nortriptyline 50 MG capsule Commonly known as: PAMELOR TAKE 1 CAPSULE (50 MG TOTAL) BY MOUTH AT BEDTIME.   nortriptyline 50 MG capsule Commonly known as: PAMELOR Take 1 capsule (50 mg total) by mouth at bedtime.   NovoLOG FlexPen 100 UNIT/ML FlexPen Generic drug: insulin aspart Inject 20 Units into the skin 3 (three) times daily with meals.   pregabalin 300 MG capsule Commonly known as: LYRICA Take 300 mg by mouth 2 (two) times daily.   ramipril 5 MG capsule Commonly known as: ALTACE TAKE 2 CAPSULES (10 MG TOTAL) BY MOUTH DAILY.         DATA REVIEWED:  Lab Results  Component Value Date   HGBA1C 9.3 (A) 09/14/2020   HGBA1C 7.1 (A) 04/07/2020   HGBA1C 7.1 (H) 08/23/2019   Lab Results  Component Value Date   MICROALBUR 0.4 04/07/2020   LDLCALC 61 04/29/2019   CREATININE 0.78 10/12/2020     Lab Results  Component Value Date   CHOL 121 04/29/2019   HDL 43 04/29/2019   LDLCALC 61 04/29/2019   TRIG 86 04/29/2019   CHOLHDL 2.8 04/29/2019       Results for YOSHIHARU, BRASSELL (MRN [id number]) as of 04/08/2020 13:10  Ref. Range 04/07/2020 15:46  Microalb, Ur Latest Units: mg/dL 0.4  MICROALB/CREAT RATIO Latest Ref Range: <30 mcg/mg creat 6  Creatinine, Urine Latest Ref Range: 20 - 320 mg/dL 69    ASSESSMENT / PLAN / RECOMMENDATIONS:   1) Type 2 Diabetes Mellitus, Poorly controlled, With neuropathic and macrovascular complications - Most recent A1c of 7.1 %. Goal A1c < 7.0 %.    - I have praised him on improved glycemic control - No side effects to any medication at this time  - I have advised him to  bring his CGM receiver on next visit    MEDICATIONS:  Metformin 1000 mg BID  Farxiga 10 mg daily   Lantus 36 units daily   Novolog 20 units TID QAC  EDUCATION / INSTRUCTIONS:  BG monitoring instructions: Patient is instructed to check his blood sugars 4 times a day, before meals and bedtime.  Call [facility] if: BG persistently < 70 . I reviewed the Rule of 15 for the treatment of hypoglycemia in detail with  the patient. Literature supplied.    2) Diabetic complications:   Eye: Does not have known diabetic retinopathy.   Neuro/ Feet: Does have known diabetic peripheral neuropathy.  Renal: Patient does not have known baseline CKD. He is on an ACEI/ARB at present.Normal MA/cr ratio    Signed electronically by: Lyndle Herrlich, MD  [facility]  [facility] [address] [place], [place] [address] Phone: [phone] FAX: [phone]   CC: Storm Frisk, MD [address] [place] [address] Phone: [phone]  Fax: [phone]  Return to Endocrinology clinic as below: Future Appointments  Date Time Provider Department Center  01/11/2021  1:40 PM Kylyn Sookram, Konrad Dolores, MD [facility]  01/27/2021  3:00 PM Jetty Duhamel D, MD [facility] None  02/07/2021  2:30 PM Nita Sickle K, DO [facility] None  02/07/2021  3:30 PM Jobe, Rolin Barry, RD [facility]

## 2021-01-17 ENCOUNTER — Encounter: Payer: Self-pay | Admitting: Internal Medicine

## 2021-01-23 ENCOUNTER — Other Ambulatory Visit: Payer: Self-pay | Admitting: Critical Care Medicine

## 2021-01-23 NOTE — Telephone Encounter (Signed)
Requested Prescriptions  Pending Prescriptions Disp Refills  . montelukast (SINGULAIR) 10 MG tablet [Pharmacy Med Name: MONTELUKAST SOD 10 MG TABLET] 90 tablet 1    Sig: TAKE 1 TABLET BY MOUTH EVERY DAY     Pulmonology:  Leukotriene Inhibitors Passed - 01/23/2021  5:04 PM      Passed - Valid encounter within last 12 months    Recent Outpatient Visits          2 months ago Type 2 diabetes mellitus with diabetic polyneuropathy, with long-term current use of insulin (HCC)   [place] [facility] Storm Frisk, MD   3 months ago Type 2 diabetes mellitus with diabetic polyneuropathy, with long-term current use of insulin (HCC)   [facility] Storm Frisk, MD   1 year ago Type 2 diabetes mellitus with hyperglycemia, with long-term current use of insulin (HCC)   [facility] Fulp, [place], MD   1 year ago Left-sided chest wall pain   [facility] [place], [place], MD   1 year ago Anxiety and depression   [facility] Cain Saupe, MD      Future Appointments            In 3 months Chilton Si, MD [facility]

## 2021-01-26 NOTE — Progress Notes (Deleted)
01/27/21- 50 yoM never smoker for sleep evaluation courtesy of Dr Delford Field with concern of OSA Medical problem list includes PSVT, HTN, CAD, CHF/ angioplasty, CVA, OSA, Asthma, DM2/ neuropathy, Obesity,  Asthma managed by Dr Delford Field Epworth score- Body weight today-                                               Previous sleep study in [place]?   Not on CPAP now. Covid vax-   CT chest 09/28/20- IMPRESSION: 1. No acute cardiopulmonary disease. Minimal linear scarring/atelectasis over the mid to lower lungs. 2. Minimal atherosclerotic coronary artery disease. Minimal calcification in the region of the aortic valve.

## 2021-01-27 ENCOUNTER — Institutional Professional Consult (permissible substitution): Payer: Medicare Other | Admitting: Internal Medicine

## 2021-02-03 ENCOUNTER — Encounter: Payer: Self-pay | Admitting: Internal Medicine

## 2021-02-03 ENCOUNTER — Other Ambulatory Visit: Payer: Self-pay

## 2021-02-03 ENCOUNTER — Ambulatory Visit (INDEPENDENT_AMBULATORY_CARE_PROVIDER_SITE_OTHER): Payer: Medicare Other | Admitting: Internal Medicine

## 2021-02-03 VITALS — BP 126/80 | HR 78 | Ht 74.0 in | Wt 261.0 lb

## 2021-02-03 DIAGNOSIS — E1159 Type 2 diabetes mellitus with other circulatory complications: Secondary | ICD-10-CM

## 2021-02-03 DIAGNOSIS — Z794 Long term (current) use of insulin: Secondary | ICD-10-CM | POA: Diagnosis not present

## 2021-02-03 DIAGNOSIS — E1142 Type 2 diabetes mellitus with diabetic polyneuropathy: Secondary | ICD-10-CM | POA: Diagnosis not present

## 2021-02-03 DIAGNOSIS — E1165 Type 2 diabetes mellitus with hyperglycemia: Secondary | ICD-10-CM | POA: Diagnosis not present

## 2021-02-03 DIAGNOSIS — L089 Local infection of the skin and subcutaneous tissue, unspecified: Secondary | ICD-10-CM | POA: Insufficient documentation

## 2021-02-03 DIAGNOSIS — E119 Type 2 diabetes mellitus without complications: Secondary | ICD-10-CM | POA: Insufficient documentation

## 2021-02-03 LAB — POCT GLYCOSYLATED HEMOGLOBIN (HGB A1C): Hemoglobin A1C: 8.8 % — AB (ref 4.0–5.6)

## 2021-02-03 MED ORDER — DAPAGLIFLOZIN PROPANEDIOL 10 MG PO TABS: 10.0000 mg | ORAL_TABLET | Freq: Every day | ORAL | 3 refills | Status: DC

## 2021-02-03 MED ORDER — METFORMIN HCL 1000 MG PO TABS: 1000.0000 mg | ORAL_TABLET | Freq: Two times a day (BID) | ORAL | 3 refills | Status: DC

## 2021-02-03 MED ORDER — INSULIN PEN NEEDLE 30G X 5 MM MISC: 1.0000 | Freq: Four times a day (QID) | 3 refills | Status: DC

## 2021-02-03 MED ORDER — NOVOLOG FLEXPEN 100 UNIT/ML ~~LOC~~ SOPN: PEN_INJECTOR | SUBCUTANEOUS | 3 refills | Status: DC

## 2021-02-03 MED ORDER — LANTUS SOLOSTAR 100 UNIT/ML ~~LOC~~ SOPN: 40.0000 [IU] | PEN_INJECTOR | Freq: Every day | SUBCUTANEOUS | 3 refills | Status: DC

## 2021-02-03 NOTE — Progress Notes (Signed)
PATIENT IDENTIFIER: Austin Vaughan is a 50 y.o. male with a past medical history of T2DM, HTN, CAD. The patient has followed with Endocrinology clinic since 10/17/2018 for consultative assistance with management of his diabetes.  DIABETIC HISTORY:  Austin Vaughan was diagnosed with T2DM since 2013. He has tried Januvia in the past but was ineffective in controlling his hyperglycemia. He used to be on Metformin but unclear the reason for discontinuation.He has been on insulin ~ 2018. His hemoglobin A1c has ranged from  9.5% in 2020, peaking at 10.0% in 2019.   On his initial visit to our clinic his A1c was 9.5% . He was on an MDi regimen and Glipizide. We stopped Glipizide and started Metformin. By 01/2019 Marcelline Deist was started.   SUBJECTIVE:   During the last visit (04/10/2020): A1c 7.1%. We continued metformin , Farxiga,  lantus and novolog    Today (02/03/2021): Austin Vaughan is here for a follow up on his diabetes management. He has not been to our clinic in 10 months.  He checks his blood sugars 4 times daily through freestyle libre . The patient has not had hypoglycemic episodes since the last clinic .   He snacks at night  Has an appointment 6/27th with RD    Denies nausea or diarrhea  He follows with pain clinic   HOME DIABETES REGIMEN:  Lantus 36 units daily HumaLog 24  units 3 times daily q. before meals Metformin 1000 mg BID Farxiga 10 mg daily       CONTINUOUS GLUCOSE MONITORING RECORD INTERPRETATION    Dates of Recording: 6/10-6/23/2022  Sensor description:freestyle libre   Results statistics:   CGM use % of time 55  Average and SD 282/ 16.9  Time in range     1   %  % Time Above 180 26  % Time above 250 73  % Time Below target 0    Glycemic patterns summary: Hyperglycemia all day and night   Hyperglycemic episodes  all day and night   Hypoglycemic episodes occurred N/A  Overnight periods: high    DIABETIC COMPLICATIONS: Microvascular complications:   Neuropathy Denies: CKD, retinopathy Last eye exam: Completed 03/2020   Macrovascular complications:  CAD Denies:PVD, CVA    HISTORY:  Past Medical History:  Past Medical History:  Diagnosis Date   Coronary artery disease    Diabetes mellitus without complication (HCC)    Diabetic foot ulcer (HCC) 08/23/2019   Hypertension    Past Surgical History:  Past Surgical History:  Procedure Laterality Date   CORONARY ANGIOPLASTY WITH STENT PLACEMENT  07/2002   Social History:  reports that he has never smoked. He has never used smokeless tobacco. He reports previous alcohol use. He reports that he does not use drugs. Family History:  Family History  Problem Relation Age of Onset   ALS Mother    Lung cancer Father    ALS Maternal Aunt    Heart disease Paternal Grandmother    Heart disease Paternal Grandfather      HOME MEDICATIONS: Allergies as of 02/03/2021       Reactions   Ambien [zolpidem Tartrate] Other (See Comments)   HALLUCINATIONS   Shellfish Allergy Anaphylaxis   Seafood        Medication List        Accurate as of February 03, 2021 10:19 AM. If you have any questions, ask your nurse or doctor.  albuterol 108 (90 Base) MCG/ACT inhaler Commonly known as: ProAir HFA Inhale 2 puffs into the lungs every 6 (six) hours as needed for wheezing or shortness of breath.   aspirin EC 81 MG tablet Take 81 mg by mouth daily.   atorvastatin 80 MG tablet Commonly known as: LIPITOR TAKE 1 TABLET (80 MG TOTAL) BY MOUTH DAILY.   atorvastatin 80 MG tablet Commonly known as: LIPITOR Take 1 tablet (80 mg total) by mouth daily.   cetirizine 10 MG tablet Commonly known as: ZYRTEC Take 1 tablet (10 mg total) by mouth daily.   dapagliflozin propanediol 10 MG Tabs tablet Commonly known as: Farxiga Take 1 tablet (10 mg total) by mouth daily.   dicyclomine 20 MG tablet Commonly known as: BENTYL Take 1 tablet (20 mg total) by mouth 4 (four) times daily as needed  (abdominal cramping).   FreeStyle Libre 14 Day Reader Hardie Pulley 1 Device by Does not apply route as directed.   FreeStyle Libre 14 Day Sensor Misc 1 Device by Does not apply route as directed.   gabapentin 600 MG tablet Commonly known as: NEURONTIN Take 6 tablets by mouth at bedtime.   glucose blood test strip 4x daily   Insulin Pen Needle 30G X 5 MM Misc 1 Device by Does not apply route in the morning, at noon, in the evening, and at bedtime. What changed: when to take this Changed by: Scarlette Shorts, MD   Lantus SoloStar 100 UNIT/ML Solostar Pen Generic drug: insulin glargine Inject 40 Units into the skin daily. What changed: how much to take Changed by: Scarlette Shorts, MD   metFORMIN 1000 MG tablet Commonly known as: GLUCOPHAGE Take 1 tablet (1,000 mg total) by mouth 2 (two) times daily with a meal.   metoprolol succinate 100 MG 24 hr tablet Commonly known as: TOPROL-XL TAKE 1 TABLET BY MOUTH EVERY DAY   montelukast 10 MG tablet Commonly known as: SINGULAIR TAKE 1 TABLET BY MOUTH EVERY DAY   nitroGLYCERIN 0.4 mg/hr patch Commonly known as: NITRODUR - Dosed in mg/24 hr Place 1 patch (0.4 mg total) onto the skin daily.   nitroGLYCERIN 0.4 MG SL tablet Commonly known as: NITROSTAT PLACE 1 TABLET UNDER THE TONGUE EVERY 5 MINUTES AS NEEDED FOR CHEST PAIN.   nortriptyline 50 MG capsule Commonly known as: PAMELOR TAKE 1 CAPSULE (50 MG TOTAL) BY MOUTH AT BEDTIME.   nortriptyline 50 MG capsule Commonly known as: PAMELOR Take 1 capsule (50 mg total) by mouth at bedtime.   NovoLOG FlexPen 100 UNIT/ML FlexPen Generic drug: insulin aspart Max daily 102 units What changed:  how much to take how to take this when to take this additional instructions Changed by: Scarlette Shorts, MD   pregabalin 300 MG capsule Commonly known as: LYRICA Take 300 mg by mouth 2 (two) times daily.   ramipril 5 MG capsule Commonly known as: ALTACE TAKE 2 CAPSULES (10 MG  TOTAL) BY MOUTH DAILY.          DATA REVIEWED:  Lab Results  Component Value Date   HGBA1C 8.8 (A) 02/03/2021   HGBA1C 9.3 (A) 09/14/2020   HGBA1C 7.1 (A) 04/07/2020   Lab Results  Component Value Date   MICROALBUR 0.4 04/07/2020   LDLCALC 61 04/29/2019   CREATININE 0.78 10/12/2020     Lab Results  Component Value Date   CHOL 121 04/29/2019   HDL 43 04/29/2019   LDLCALC 61 04/29/2019   TRIG 86 04/29/2019   CHOLHDL 2.8 04/29/2019  ASSESSMENT / PLAN / RECOMMENDATIONS:   1) Type 2 Diabetes Mellitus, Poorly controlled, With neuropathic and macrovascular complications - Most recent A1c of 8.8 %. Goal A1c < 7.0 %.   - Pt has not been here in 10 months, his doses have changes his his last visit there. He has dietary indiscretions, last night his BG was 200 mg/dL and he ate 4 bananas and other things, this AM his BG was 277 mg/dL. I have counseled him about avoiding snacks, I have counseled him about serving size for fruits, he has an upcoming appointment with RD - He is not sure if he is taking farxiga, will refill - I will provide him with correction scale   MEDICATIONS: Continue Metformin 1000 mg BID Continue Farxiga 10 mg daily  Increase Lantus 40 units daily  Continue Novolog 24 units TID QAC CF : Novolog (BG-130/20)   EDUCATION / INSTRUCTIONS: BG monitoring instructions: Patient is instructed to check his blood sugars 4 times a day, before meals and bedtime. Call [facility] if: BG persistently < 70 I reviewed the Rule of 15 for the treatment of hypoglycemia in detail with the patient. Literature supplied.     2) Diabetic complications:  Eye: Does not have known diabetic retinopathy.  Neuro/ Feet: Does have known diabetic peripheral neuropathy. Renal: Patient does not have known baseline CKD. He is on an ACEI/ARB at present.   F/U in 6 months   Signed electronically by: Lyndle Herrlich, MD  [facility] Endocrinology  [facility] [address] [place], [place] [address] Phone: [phone] FAX: [phone]   CC: Storm Frisk, MD [address] [place] [address] Phone: [phone]  Fax: [phone]  Return to Endocrinology clinic as below: Future Appointments  Date Time Provider Department Center  02/07/2021  2:30 PM Glendale Chard, DO [facility] None  04/04/2021  3:00 PM Bonnita Levan, RD [facility]  05/09/2021  3:40 PM Chilton Si, MD [facility]

## 2021-02-03 NOTE — Patient Instructions (Signed)
-   Increase Lantus to 40 units daily  - Continue  Novolog 24  units with each meal  - Continue Metformin 1000 mg, twice daily  - Continue Farxiga 10, 1 tablet daily with Breakfast  -Novolog correctional insulin: ADD extra units on insulin to your meal-time Novolog dose if your blood sugars are higher than 150. Use the scale below to help guide you:   Blood sugar before meal Number of units to inject  Less than 150 0 unit  151 -  170 1 units  171 -  190 2 units  191 -  210 3 units  211 -  230 4 units  231 -  250 5 units  251 -  270 6 units  271 -  290 7 units  291 -  310 8 units  311- 330 9 units      HOW TO TREAT LOW BLOOD SUGARS (Blood sugar LESS THAN 70 MG/DL) Please follow the RULE OF 15 for the treatment of hypoglycemia treatment (when your (blood sugars are less than 70 mg/dL)   STEP 1: Take 15 grams of carbohydrates when your blood sugar is low, which includes:  3-4 GLUCOSE TABS  OR 3-4 OZ OF JUICE OR REGULAR SODA OR ONE TUBE OF GLUCOSE GEL    STEP 2: RECHECK blood sugar in 15 MINUTES STEP 3: If your blood sugar is still low at the 15 minute recheck --> then, go back to STEP 1 and treat AGAIN with another 15 grams of carbohydrates.

## 2021-02-07 ENCOUNTER — Ambulatory Visit: Payer: Medicare Other | Admitting: Dietician

## 2021-02-07 ENCOUNTER — Ambulatory Visit: Payer: Medicare Other | Admitting: Neurology

## 2021-02-11 ENCOUNTER — Encounter: Payer: Self-pay | Admitting: Internal Medicine

## 2021-03-03 ENCOUNTER — Encounter: Payer: Self-pay | Admitting: Podiatry

## 2021-03-14 ENCOUNTER — Encounter: Payer: Self-pay | Admitting: Critical Care Medicine

## 2021-03-14 ENCOUNTER — Telehealth (HOSPITAL_BASED_OUTPATIENT_CLINIC_OR_DEPARTMENT_OTHER): Payer: Medicare Other | Admitting: Critical Care Medicine

## 2021-03-14 DIAGNOSIS — U071 COVID-19: Secondary | ICD-10-CM | POA: Diagnosis not present

## 2021-03-14 MED ORDER — MOLNUPIRAVIR EUA 200MG CAPSULE: 4.0000 | ORAL_CAPSULE | Freq: Two times a day (BID) | ORAL | 0 refills | Status: AC

## 2021-03-14 NOTE — Telephone Encounter (Signed)
Outpatient Oral COVID Treatment Note  I connected with Austin Vaughan on 03/14/2021/9:19 AM by telephone and verified that I am speaking with the correct person using two identifiers.  I discussed the limitations, risks, security, and privacy concerns of performing an evaluation and management service by telephone and the availability of in person appointments. I also discussed with the patient that there may be a patient responsible charge related to this service. The patient expressed understanding and agreed to proceed. Patient location: pt is at home Provider location: I am in my office  Diagnosis: COVID-19 infection  Purpose of visit: Discussion of potential use of Molnupiravir or Paxlovid, a new treatment for mild to moderate COVID-19 viral infection in non-hospitalized patients.   Subjective: Patient is a 50 y.o. male who has been diagnosed with COVID 19 viral infection.  Their symptoms began on  with 03/10/21.    Past Medical History:  Diagnosis Date   Coronary artery disease    Diabetes mellitus without complication (HCC)    Diabetic foot ulcer (HCC) 08/23/2019   Hypertension     Allergies  Allergen Reactions   Ambien [Zolpidem Tartrate] Other (See Comments)    HALLUCINATIONS   Shellfish Allergy Anaphylaxis    Seafood     Current Outpatient Medications:    albuterol (PROAIR HFA) 108 (90 Base) MCG/ACT inhaler, Inhale 2 puffs into the lungs every 6 (six) hours as needed for wheezing or shortness of breath., Disp: 1 each, Rfl: 11   aspirin EC 81 MG tablet, Take 81 mg by mouth daily., Disp: , Rfl:    atorvastatin (LIPITOR) 80 MG tablet, TAKE 1 TABLET (80 MG TOTAL) BY MOUTH DAILY., Disp: 30 tablet, Rfl: 3   cetirizine (ZYRTEC) 10 MG tablet, Take 1 tablet (10 mg total) by mouth daily., Disp: 30 tablet, Rfl: 11   Continuous Blood Gluc Receiver (FREESTYLE LIBRE 14 DAY READER) DEVI, 1 Device by Does not apply route as directed., Disp: 1 each, Rfl: 0   Continuous Blood Gluc Sensor  (FREESTYLE LIBRE 14 DAY SENSOR) MISC, 1 Device by Does not apply route as directed., Disp: 2 each, Rfl: 11   dapagliflozin propanediol (FARXIGA) 10 MG TABS tablet, Take 1 tablet (10 mg total) by mouth daily., Disp: 90 tablet, Rfl: 3   dicyclomine (BENTYL) 20 MG tablet, Take 1 tablet (20 mg total) by mouth 4 (four) times daily as needed (abdominal cramping)., Disp: 120 tablet, Rfl: 0   gabapentin (NEURONTIN) 600 MG tablet, Take 6 tablets by mouth at bedtime., Disp: , Rfl:    glucose blood test strip, 4x daily, Disp: 150 each, Rfl: 12   insulin aspart (NOVOLOG FLEXPEN) 100 UNIT/ML FlexPen, Max daily 102 units, Disp: 90 mL, Rfl: 3   insulin glargine (LANTUS SOLOSTAR) 100 UNIT/ML Solostar Pen, Inject 40 Units into the skin daily., Disp: 45 mL, Rfl: 3   Insulin Pen Needle 30G X 5 MM MISC, 1 Device by Does not apply route in the morning, at noon, in the evening, and at bedtime., Disp: 400 each, Rfl: 3   metFORMIN (GLUCOPHAGE) 1000 MG tablet, Take 1 tablet (1,000 mg total) by mouth 2 (two) times daily with a meal., Disp: 180 tablet, Rfl: 3   metoprolol succinate (TOPROL-XL) 100 MG 24 hr tablet, TAKE 1 TABLET BY MOUTH EVERY DAY, Disp: 180 tablet, Rfl: 2   molnupiravir EUA 200 mg CAPS, Take 4 capsules (800 mg total) by mouth 2 (two) times daily for 5 days., Disp: 40 capsule, Rfl: 0   montelukast (SINGULAIR)  10 MG tablet, TAKE 1 TABLET BY MOUTH EVERY DAY, Disp: 90 tablet, Rfl: 1   nitroGLYCERIN (NITRODUR - DOSED IN MG/24 HR) 0.4 mg/hr patch, Place 1 patch (0.4 mg total) onto the skin daily., Disp: 90 patch, Rfl: 2   nitroGLYCERIN (NITROSTAT) 0.4 MG SL tablet, PLACE 1 TABLET UNDER THE TONGUE EVERY 5 MINUTES AS NEEDED FOR CHEST PAIN., Disp: 25 tablet, Rfl: 3   pregabalin (LYRICA) 300 MG capsule, Take 300 mg by mouth 2 (two) times daily., Disp: , Rfl:    ramipril (ALTACE) 5 MG capsule, TAKE 2 CAPSULES (10 MG TOTAL) BY MOUTH DAILY., Disp: 180 capsule, Rfl: 3   nortriptyline (PAMELOR) 50 MG capsule, TAKE 1 CAPSULE  (50 MG TOTAL) BY MOUTH AT BEDTIME., Disp: 90 capsule, Rfl: 2  Objective: Patient appears/sounds well.  They are in no apparent distress.  Breathing is non labored.  Mood and behavior are normal.  Laboratory Data:  Recent Results (from the past 2160 hour(s))  POCT glycosylated hemoglobin (Hb A1C)     Status: Abnormal   Collection Time: 02/03/21  9:43 AM  Result Value Ref Range   Hemoglobin A1C 8.8 (A) 4.0 - 5.6 %   HbA1c POC (<> result, manual entry)     HbA1c, POC (prediabetic range)     HbA1c, POC (controlled diabetic range)       Assessment: 50 y.o. male with mild/moderate COVID 19 viral infection diagnosed on 7/31 at high risk for progression to severe COVID 19.  Plan:  This patient is a 50 y.o. male that meets the following criteria for Emergency Use Authorization of: Molnupiravir  Age >18 yr SARS-COV-2 positive test Symptom onset < 5 days Mild-to-moderate COVID disease with high risk for severe progression to hospitalization or death  I have spoken and communicated the following to the patient or parent/caregiver regarding: Molnupiravir is an unapproved drug that is authorized for use under an Emergency Use Authorization.  There are no adequate, approved, available products for the treatment of COVID-19 in adults who have mild-to-moderate COVID-19 and are at high risk for progressing to severe COVID-19, including hospitalization or death. Other therapeutics are currently authorized. For additional information on all products authorized for treatment or prevention of COVID-19, please see https://www.graham-miller.com/.  There are benefits and risks of taking this treatment as outlined in the "Fact Sheet for Patients and Caregivers."  "Fact Sheet for Patients and Caregivers" was reviewed with patient. A hard copy will be provided to patient from pharmacy prior to the patient receiving  treatment. Patients should continue to self-isolate and use infection control measures (e.g., wear mask, isolate, social distance, avoid sharing personal items, clean and disinfect "high touch" surfaces, and frequent handwashing) according to CDC guidelines.  The patient or parent/caregiver has the option to accept or refuse treatment. [employer] has established a pregnancy surveillance program. Females of childbearing potential should use a reliable method of contraception correctly and consistently, as applicable, for the duration of treatment and for 4 days after the last dose of Molnupiravir. Males of reproductive potential who are sexually active with females of childbearing potential should use a reliable method of contraception correctly and consistently during treatment and for at least 3 months after the last dose. Pregnancy status and risk was assessed. Patient verbalized understanding of precautions.  After reviewing above information with the patient, the patient agrees to receive molnupiravir.  Follow up instructions:    Take prescription BID x 5 days as directed Reach out to pharmacist for counseling  on medication if desired For concerns regarding further COVID symptoms please follow up with your PCP or urgent care For urgent or life-threatening issues, seek care at your local emergency department  The patient was provided an opportunity to ask questions, and all were answered. The patient agreed with the plan and demonstrated an understanding of the instructions.   Script sent to [facility] and opted to pick up RX.  The patient was advised to call their PCP or seek an in-person evaluation if the symptoms worsen or if the condition fails to improve as anticipated.   I provided 10 minutes of non face-to-face telephone visit time during this encounter, and > 50% was spent counseling as documented under my assessment & plan.  Shan Levans, MD 03/14/2021 /9:19 AM

## 2021-03-17 ENCOUNTER — Other Ambulatory Visit: Payer: Self-pay | Admitting: Critical Care Medicine

## 2021-03-17 DIAGNOSIS — E1140 Type 2 diabetes mellitus with diabetic neuropathy, unspecified: Secondary | ICD-10-CM

## 2021-03-17 NOTE — Telephone Encounter (Signed)
Requested medication (s) are due for refill today  Yes  Requested medication (s) are on the active medication list  Yes  Future visit scheduled No.  LOV 11/03/20.  Note to clinic-Medication was last ordered by historical provider ([name] Fulp) as well as a new current prescription is needed. Expired on 11/19/20. Routing to office for updates/approval.   Requested Prescriptions  Pending Prescriptions Disp Refills   nortriptyline (PAMELOR) 50 MG capsule [Pharmacy Med Name: NORTRIPTYLINE HCL 50 MG CAP] 90 capsule 2    Sig: TAKE 1 CAPSULE BY MOUTH AT BEDTIME.      Psychiatry:  Antidepressants - Heterocyclics (TCAs) Passed - 03/17/2021 11:01 AM      Passed - Completed PHQ-2 or PHQ-9 in the last 360 days      Passed - Valid encounter within last 6 months    Recent Outpatient Visits           4 months ago Type 2 diabetes mellitus with diabetic polyneuropathy, with long-term current use of insulin [facility])   [place] [facility] Storm Frisk, MD   4 months ago Type 2 diabetes mellitus with diabetic polyneuropathy, with long-term current use of insulin [facility])   [facility] Storm Frisk, MD   1 year ago Type 2 diabetes mellitus with hyperglycemia, with long-term current use of insulin (HCC)   [place] [facility] Fulp, [place], MD   1 year ago Left-sided chest wall pain   [place] [facility] [place], [place], MD   2 years ago Anxiety and depression   [place] [facility] [place], [place] Shorts, MD       Future Appointments             In 1 month Chilton Si, MD [facility]

## 2021-03-22 IMAGING — CR DG TOE 2ND 2+V*R*
3 series · 4 of 4 positions shown · non-contrast
Comparison: None.

CLINICAL DATA: Diabetic ulcer.

EXAM:
RIGHT SECOND TOE

[toe ap]
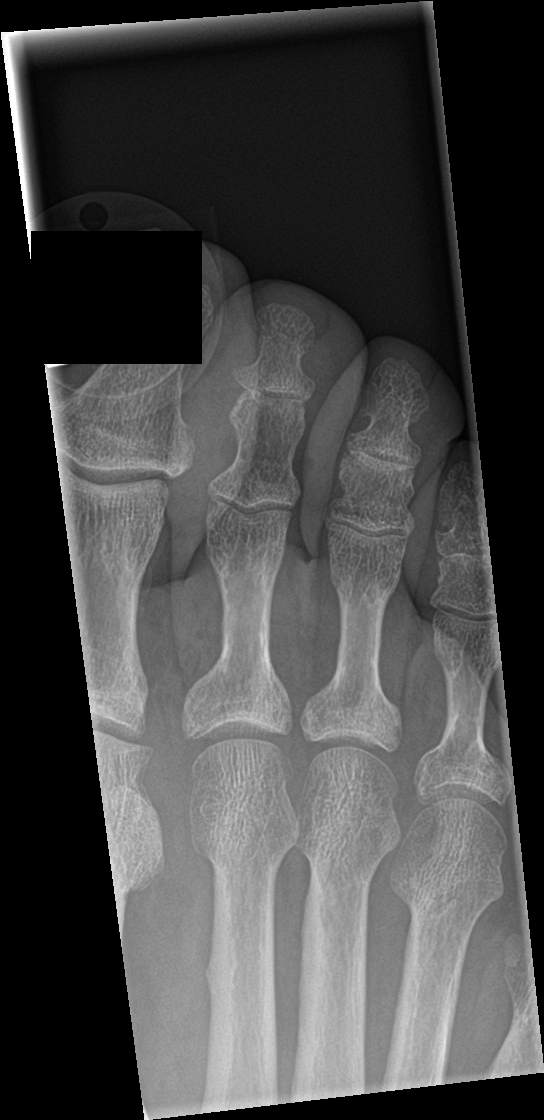

[toe obl]
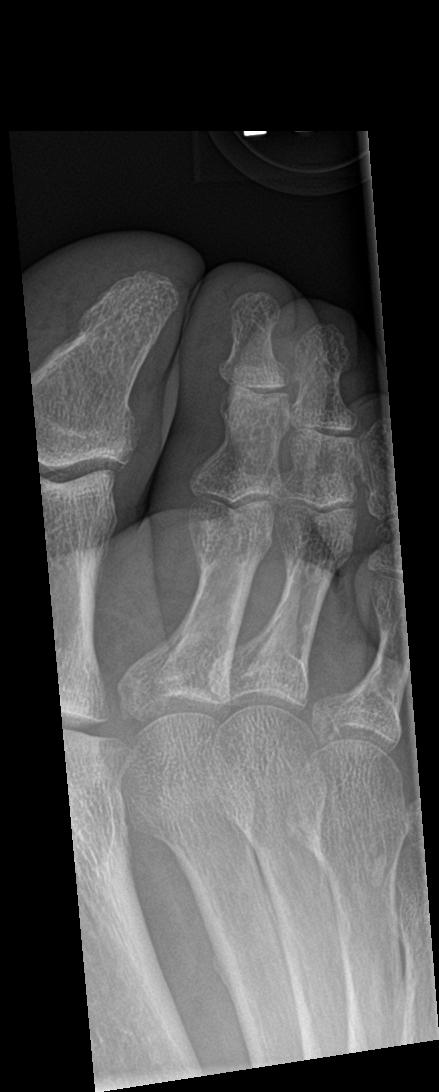

[Series 3: toe lat · 0.14mm/px · 2 of 2 slices shown]
[im 1/2]
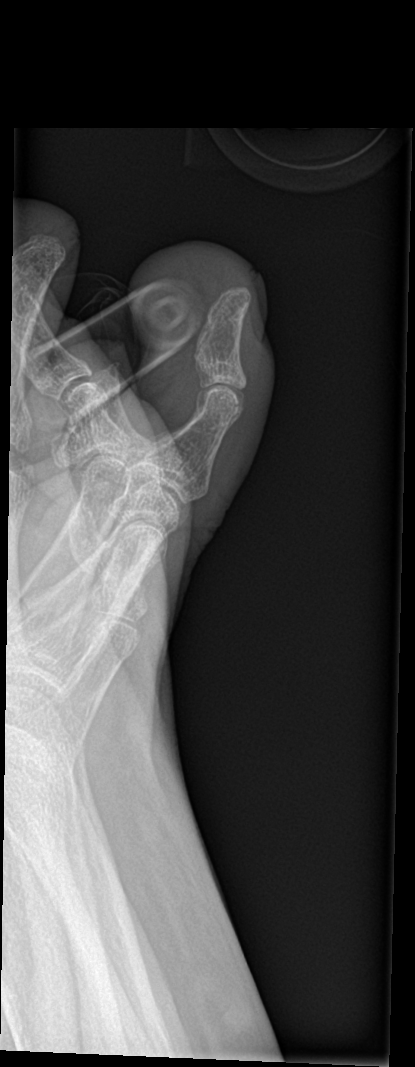
[im 2/2]
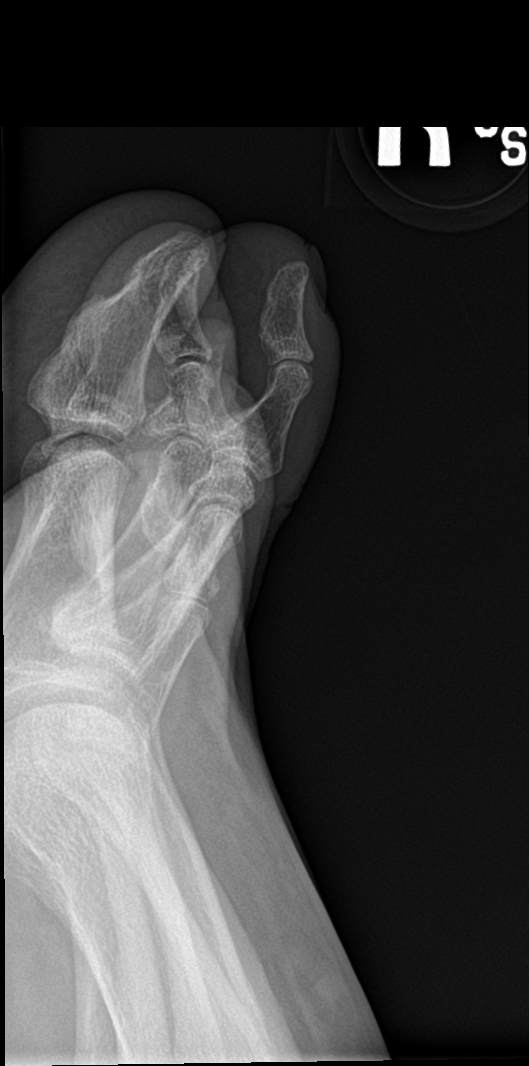

[4 of 4 positions shown; findings below may reference images not displayed]

FINDINGS: No evidence of osteomyelitis.  No soft tissue gas noted.
IMPRESSION: Negative.

## 2021-03-31 ENCOUNTER — Encounter: Payer: Self-pay | Admitting: Podiatry

## 2021-03-31 ENCOUNTER — Other Ambulatory Visit: Payer: Self-pay

## 2021-03-31 ENCOUNTER — Ambulatory Visit (INDEPENDENT_AMBULATORY_CARE_PROVIDER_SITE_OTHER): Payer: Medicare Other | Admitting: Podiatry

## 2021-03-31 DIAGNOSIS — L601 Onycholysis: Secondary | ICD-10-CM | POA: Diagnosis not present

## 2021-03-31 DIAGNOSIS — M79676 Pain in unspecified toe(s): Secondary | ICD-10-CM | POA: Diagnosis not present

## 2021-03-31 NOTE — Progress Notes (Signed)
  Subjective:  Patient ID: Austin Vaughan, male    DOB: 10-22-1970,  MRN: [id number]  Chief Complaint  Patient presents with   Nail Problem    The big toenails fell off last night and have been draining but not now and has been discolored for about 6 months or so   50 y.o. male presents with the above complaint. History confirmed with patient.   Objective:  Physical Exam: warm, good capillary refill, no trophic changes or ulcerative lesions, normal DP and PT pulses, and normal sensory exam.  Bilateral hallux nail with recent avulsion injury but healing well without warmth erythema signs of infection.  Mild scabbing of the nailbed noted  Assessment:   1. Onycholysis   2. Pain around toenail      Plan:  Patient was evaluated and treated and all questions answered.  Onycholysis -Educated on etiology and possible causes.  The nail beds appear healthy without signs of infection.  Dressed with antibiotic ointment and Band-Aid today.  Continue to do this daily x1 week.  Follow-up should issues persist  No follow-ups on file.

## 2021-04-04 ENCOUNTER — Encounter: Payer: Medicare Other | Attending: Critical Care Medicine | Admitting: Dietician

## 2021-04-06 NOTE — Progress Notes (Deleted)
Follow-up Visit   Date: 04/06/21   Austin Vaughan MRN: [id number] DOB: [birth year]   Interim History: Austin Vaughan is a 50 y.o. right-handed Hispanic male with CAD s/p PCI, hypertension, poorly-controlled insulin-dependent diabetes mellitis, hyperlipidemia, and bilateral ulnar neuropathy returning to the clinic for follow-up of diabetic neuropathy.  The patient was accompanied to the clinic by *** who also provides collateral information.    History of present illness: He had heart attack in 2013 and was diagnosed with diabetes at the same time.  He was not getting medical care prior to this.  Soon after this, he began experiencing numbness, tingling, and pain in the feet which has gradually moved up his lower legs and into the hands. He was diagnosed with neuropathy around 2017 by Dr. Jeronimo Norma at [facility] in [place], [place].  He underwent NCS/EMG of the arms and legs, which per patient, indicated he has neuropathy and bilateral ulnar neuropathy.  He did seek the opinion of ulnar nerve transposition and was told that he would not have any benefit.    H he continues to have numbness/tingling and sharp pain of the lower legs and hands.  He has imbalance and uses a cane for the past 1-2 years.  He has fallen twice over the past year.  He is usually able to stand up by himself.  He has weakness of the hands and cannot open jars/bottles.  He drops things frequently.  He has mild weakness of the legs.  He has not done any physical therapy.   He was taking gabapentin 600 mg 8-10 times daily in [place] and established care with Dr. Wynn Banker in PM&R for chronic pain associated with diabetic neuropathy.  He is on gabapentin 3600mg  at bedtime and nortriptyline 50mg  at bedtime.  I informed patient maximal gabapentin dose is 3600mg /d.   He has strong family history of ALS in mother, maternal aunts x 4 (3 brothers unaffected), male cousins x 4.  He has one brother that is unaffected.  His mother  died at 64.  He denies any difficulty swallowing, muscle twitches.  He has occasional slurred speech and apparently had a MRI brain in .  I will request his records to review.   UPDATE 04/07/2021: ***  Medications:  Current Outpatient Medications on File Prior to Visit  Medication Sig Dispense Refill   albuterol (PROAIR HFA) 108 (90 Base) MCG/ACT inhaler Inhale 2 puffs into the lungs every 6 (six) hours as needed for wheezing or shortness of breath. 1 each 11   aspirin EC 81 MG tablet Take 81 mg by mouth daily.     atorvastatin (LIPITOR) 80 MG tablet TAKE 1 TABLET (80 MG TOTAL) BY MOUTH DAILY. 30 tablet 3   cetirizine (ZYRTEC) 10 MG tablet Take 1 tablet (10 mg total) by mouth daily. 30 tablet 11   Continuous Blood Gluc Receiver (FREESTYLE LIBRE 14 DAY READER) DEVI 1 Device by Does not apply route as directed. 1 each 0   Continuous Blood Gluc Sensor (FREESTYLE LIBRE 14 DAY SENSOR) MISC 1 Device by Does not apply route as directed. 2 each 11   dapagliflozin propanediol (FARXIGA) 10 MG TABS tablet Take 1 tablet (10 mg total) by mouth daily. 90 tablet 3   dicyclomine (BENTYL) 20 MG tablet Take 1 tablet (20 mg total) by mouth 4 (four) times daily as needed (abdominal cramping). 120 tablet 0   gabapentin (NEURONTIN) 600 MG tablet Take 6 tablets by mouth at bedtime.  glucose blood test strip 4x daily 150 each 12   insulin aspart (NOVOLOG FLEXPEN) 100 UNIT/ML FlexPen Max daily 102 units 90 mL 3   insulin glargine (LANTUS SOLOSTAR) 100 UNIT/ML Solostar Pen Inject 40 Units into the skin daily. 45 mL 3   Insulin Pen Needle 30G X 5 MM MISC 1 Device by Does not apply route in the morning, at noon, in the evening, and at bedtime. 400 each 3   metFORMIN (GLUCOPHAGE) 1000 MG tablet Take 1 tablet (1,000 mg total) by mouth 2 (two) times daily with a meal. 180 tablet 3   metoprolol succinate (TOPROL-XL) 100 MG 24 hr tablet TAKE 1 TABLET BY MOUTH EVERY DAY 180 tablet 2   montelukast (SINGULAIR) 10 MG  tablet TAKE 1 TABLET BY MOUTH EVERY DAY 90 tablet 1   nitroGLYCERIN (NITRODUR - DOSED IN MG/24 HR) 0.4 mg/hr patch Place 1 patch (0.4 mg total) onto the skin daily. 90 patch 2   nitroGLYCERIN (NITROSTAT) 0.4 MG SL tablet PLACE 1 TABLET UNDER THE TONGUE EVERY 5 MINUTES AS NEEDED FOR CHEST PAIN. 25 tablet 3   nortriptyline (PAMELOR) 50 MG capsule TAKE 1 CAPSULE BY MOUTH AT BEDTIME. 30 capsule 0   pregabalin (LYRICA) 300 MG capsule Take 300 mg by mouth 2 (two) times daily.     ramipril (ALTACE) 5 MG capsule TAKE 2 CAPSULES (10 MG TOTAL) BY MOUTH DAILY. 180 capsule 3   No current facility-administered medications on file prior to visit.    Allergies:  Allergies  Allergen Reactions   Ambien [Zolpidem Tartrate] Other (See Comments)    HALLUCINATIONS   Shellfish Allergy Anaphylaxis    Seafood    Vital Signs:  There were no vitals taken for this visit.   General Medical Exam:   General:  Well appearing, comfortable  Eyes/ENT: see cranial nerve examination.   Neck:  No carotid bruits. Respiratory:  Clear to auscultation, good air entry bilaterally.   Cardiac:  Regular rate and rhythm, no murmur.   Ext:  No edema ***  Neurological Exam: MENTAL STATUS including orientation to time, place, person, recent and remote memory, attention span and concentration, language, and fund of knowledge is ***normal.  Speech is not dysarthric.  CRANIAL NERVES:  No visual field defects.  Pupils equal round and reactive to light.  Normal conjugate, extra-ocular eye movements in all directions of gaze.  No ptosis ***.  Face is symmetric. Palate elevates symmetrically.  Tongue is midline.  MOTOR:  Motor strength is 5/5 in all extremities, ***.  No atrophy, fasciculations or abnormal movements.  No pronator drift.  Tone is normal.    MSRs:  Reflexes are 2+/4 throughout ***.  SENSORY:  Intact to vibration throughout ***.  COORDINATION/GAIT:  Normal finger-to- nose-finger.  Intact rapid alternating movements  bilaterally.  Gait narrow based and stable.   Data:***  IMPRESSION/PLAN: ***  Diabetic polyneuropathy affecting a stocking-glove pattern with painful paresthesias, sensory ataxia, and weakness.  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  Fall precautions discussed, always use a cane.   Unfortunately, the neurological deficits from his neuropathy will be lasting deficits and goal is to minimize progression by optimal diabetes control.  His last HbA1c is 7.1, which is great and I encouraged him to continue to keep sugars controlled  PLAN/RECOMMENDATIONS:  ***  Return to clinic in ***.   Total time spent:  ***  Thank you for allowing me to participate in patient's care.  If I can answer any additional questions, I would be pleased to do so.    Sincerely,    Montina Dorrance K. Posey Pronto, DO

## 2021-04-07 ENCOUNTER — Ambulatory Visit: Payer: Medicare Other | Admitting: Neurology

## 2021-05-09 ENCOUNTER — Ambulatory Visit (HOSPITAL_BASED_OUTPATIENT_CLINIC_OR_DEPARTMENT_OTHER): Payer: Medicare Other | Admitting: Cardiovascular Disease

## 2021-05-13 ENCOUNTER — Encounter: Payer: Self-pay | Admitting: Podiatry

## 2021-05-25 ENCOUNTER — Other Ambulatory Visit: Payer: Self-pay | Admitting: Critical Care Medicine

## 2021-05-25 DIAGNOSIS — E1140 Type 2 diabetes mellitus with diabetic neuropathy, unspecified: Secondary | ICD-10-CM

## 2021-05-26 NOTE — Telephone Encounter (Signed)
Requested medications are due for refill today.  yes  Requested medications are on the active medications list.  yes  Last refill. 03/18/2021  Future visit scheduled.   no  Notes to clinic.  Pt already given a courtesy refill.

## 2021-06-12 ENCOUNTER — Encounter (HOSPITAL_BASED_OUTPATIENT_CLINIC_OR_DEPARTMENT_OTHER): Payer: Self-pay

## 2021-06-13 NOTE — Telephone Encounter (Signed)
Spoke to patient he stated he has been having chest pain off and on for the past couple of weeks.He took NTG x 3 and applied a NTG patch last night with relief.He has appointment with Dr.Toluca 11/8 at 2:00 pm.Advised keep appointment as planned.Advised to go to ED if he has any more chest pain.

## 2021-06-18 ENCOUNTER — Other Ambulatory Visit: Payer: Self-pay | Admitting: Critical Care Medicine

## 2021-06-18 DIAGNOSIS — E1140 Type 2 diabetes mellitus with diabetic neuropathy, unspecified: Secondary | ICD-10-CM

## 2021-06-18 NOTE — Telephone Encounter (Signed)
Requested medication (s) are due for refill today: yes  Requested medication (s) are on the active medication list: yes  Last refill:  05/27/21  #30  Courtesy Refill  Future visit scheduled: overdue- called pt and LM to call office to mae overdue appt- phone number provided  Notes to clinic:  see above   Requested Prescriptions  Pending Prescriptions Disp Refills   nortriptyline (PAMELOR) 50 MG capsule [Pharmacy Med Name: NORTRIPTYLINE HCL 50 MG CAP] 90 capsule 1    Sig: TAKE 1 CAPSULE BY MOUTH EVERYDAY AT BEDTIME     Psychiatry:  Antidepressants - Heterocyclics (TCAs) Failed - 06/18/2021 12:30 PM      Failed - Valid encounter within last 6 months    Recent Outpatient Visits           7 months ago Type 2 diabetes mellitus with diabetic polyneuropathy, with long-term current use of insulin (HCC)   [facility] [place] Frisk, MD   8 months ago Type 2 diabetes mellitus with diabetic polyneuropathy, with long-term current use of insulin (HCC)   [facility] Storm Frisk, MD   1 year ago Type 2 diabetes mellitus with hyperglycemia, with long-term current use of insulin (HCC)   [facility] [place], [place], MD   2 years ago Left-sided chest wall pain   [facility] [place], [place], MD   2 years ago Anxiety and depression   [facility] [place], [place], MD       Future Appointments             In 3 days Chilton Si, MD [facility]            Passed - Completed PHQ-2 or PHQ-9 in the last 360 days

## 2021-06-20 ENCOUNTER — Ambulatory Visit: Payer: Medicare Other | Admitting: Neurology

## 2021-06-21 ENCOUNTER — Other Ambulatory Visit: Payer: Self-pay

## 2021-06-21 ENCOUNTER — Ambulatory Visit (INDEPENDENT_AMBULATORY_CARE_PROVIDER_SITE_OTHER): Payer: Medicare Other | Admitting: Cardiovascular Disease

## 2021-06-21 ENCOUNTER — Encounter (HOSPITAL_BASED_OUTPATIENT_CLINIC_OR_DEPARTMENT_OTHER): Payer: Self-pay | Admitting: Cardiovascular Disease

## 2021-06-21 VITALS — BP 110/72 | HR 77 | Ht 74.0 in | Wt 266.0 lb

## 2021-06-21 DIAGNOSIS — E785 Hyperlipidemia, unspecified: Secondary | ICD-10-CM

## 2021-06-21 DIAGNOSIS — R079 Chest pain, unspecified: Secondary | ICD-10-CM | POA: Diagnosis not present

## 2021-06-21 DIAGNOSIS — I2510 Atherosclerotic heart disease of native coronary artery without angina pectoris: Secondary | ICD-10-CM

## 2021-06-21 DIAGNOSIS — Z8673 Personal history of transient ischemic attack (TIA), and cerebral infarction without residual deficits: Secondary | ICD-10-CM

## 2021-06-21 DIAGNOSIS — K219 Gastro-esophageal reflux disease without esophagitis: Secondary | ICD-10-CM

## 2021-06-21 DIAGNOSIS — G4733 Obstructive sleep apnea (adult) (pediatric): Secondary | ICD-10-CM

## 2021-06-21 DIAGNOSIS — Z9861 Coronary angioplasty status: Secondary | ICD-10-CM | POA: Diagnosis not present

## 2021-06-21 DIAGNOSIS — R0609 Other forms of dyspnea: Secondary | ICD-10-CM

## 2021-06-21 HISTORY — DX: Gastro-esophageal reflux disease without esophagitis: K21.9

## 2021-06-21 MED ORDER — PANTOPRAZOLE SODIUM 40 MG PO TBEC: 40.0000 mg | DELAYED_RELEASE_TABLET | Freq: Every day | ORAL | 0 refills | Status: DC

## 2021-06-21 NOTE — Assessment & Plan Note (Signed)
Continue aspirin and atorvastatin. ?

## 2021-06-21 NOTE — Progress Notes (Signed)
Cardiology Office Note   Date:  06/22/2021   ID:  TRAYSHAWN SHADDY, DOB August 24, 1970, MRN [id number]  PCP:  Elsie Stain, MD  Cardiologist:   Skeet Latch, MD   Chief Complaint  Patient presents with   Chest Pain      History of Present Illness: Austin Vaughan is a 50 y.o. male with hypertension, diabetes, NSVT, and CAD s/p PCI here for follow up.  He was treated for an MI in 2013 while living in [place], [place].  He presented with bilateral arm and chest pain.  He underwent PCI in an unknown vessel and reports that his LVEF was 47%.  Prior to moving to [place] he had a nuclear stress 04/2028 that revealed no ischemia and LVEF 53%.  He moved to [place] to be closer to his daughter-in-law who recently had a baby.  At his initial appointment he complained of intermittent chest pain.  It was noted that he was not using his nitroglycerin patch daily.  It was recommended that he start to use it each day.  He followed up with Kerin Ransom, PA-C on 10/2018 and reported palpitations.  He had a 14-day ZIO patch that showed short runs of PSVT.  Metoprolol was increased to 50 mg.  He followed up with Doreene Adas, PA, on 7/30 and reported pleuritic chest pain that was inconsistent with ACS.  He was seen at [facility] and it was confirmed that he does not have ALS.  He has been suffering with severe neuropathic pain.  Mr. Eno reports frequent palpitations.  His symptoms have not improved since increasing metoprolol.  He wore an ambulatory monitor that revealed rare PACs and PVCs and some short runs of SVT.  Metoprolol was increased to 75 mg daily.  He does not think this helped much. So it was increased to 200 mg. He reported chest tightness and shortness of breath. He had an echocardiogram 04/2019 that revealed LVEF 55 to 60% with grade 1 diastolic dysfunction. There was aortic valve sclerosis without stenosis.  Metoprolol was also increased.  He had chronic limb ischemia and gangrene of the right foot.  ABIs were -  08/2019.  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  He followed with the wound center and his wound healed completely with a topical cream.  Lately he has been feeling otherwise okay.   Mr. Dhaliwal called the office 05/2021 with chest pain and had been taking nitroglycerin. He says he has been having chest pain, and he has been using patches and medication to manage his pain. His chest pain lasts constantly for about 30 minutes. Sometimes he notices it when sitting down watching tv.  He describes it as sharp chest pain. When he presses his chest tightly the pain will stop. He has over the counter medication for his acid reflux and eats spicy foods. Yesterday, he was having a lot of palpitations that happen at night before he went to sleep. He said "it felt like he had bubbles." He said the palpitations lasted around an hour. He gets out of breath when doing simple tasks like putting on shoes, and putting on clothes. Has not been exercising lately, due to his ankle pain, and he uses a cane to keep his balance. However he denies exertional chest pain.  At home, he regularly checks his blood pressure. He states his blood pressure is not that high at home. Typically his diet includes spicy foods. He denies any lightheadedness, headaches, syncope,  orthopnea, PND, or lower extremity edema. He is currently fasting.   Past Medical History:  Diagnosis Date   Coronary artery disease    Diabetes mellitus without complication ([place])    Diabetic foot ulcer ([place]) 08/23/2019   GERD (gastroesophageal reflux disease) 06/21/2021   Hypertension     Past Surgical History:  Procedure Laterality Date   CORONARY ANGIOPLASTY WITH STENT PLACEMENT  07/2002     Current Outpatient Medications  Medication Sig Dispense Refill   albuterol (PROAIR HFA) 108 (90 Base) MCG/ACT inhaler Inhale 2 puffs into the lungs every 6 (six) hours as needed for wheezing or shortness of breath. 1 each 11   aspirin EC 81 MG  tablet Take 81 mg by mouth daily.     atorvastatin (LIPITOR) 80 MG tablet TAKE 1 TABLET (80 MG TOTAL) BY MOUTH DAILY. 30 tablet 3   cetirizine (ZYRTEC) 10 MG tablet Take 1 tablet (10 mg total) by mouth daily. 30 tablet 11   Continuous Blood Gluc Receiver (FREESTYLE LIBRE 14 DAY READER) DEVI 1 Device by Does not apply route as directed. 1 each 0   Continuous Blood Gluc Sensor (FREESTYLE LIBRE 14 DAY SENSOR) MISC 1 Device by Does not apply route as directed. 2 each 11   dapagliflozin propanediol (FARXIGA) 10 MG TABS tablet Take 1 tablet (10 mg total) by mouth daily. 90 tablet 3   dicyclomine (BENTYL) 20 MG tablet Take 1 tablet (20 mg total) by mouth 4 (four) times daily as needed (abdominal cramping). 120 tablet 0   gabapentin (NEURONTIN) 600 MG tablet Take 6 tablets by mouth at bedtime.     glucose blood test strip 4x daily 150 each 12   insulin aspart (NOVOLOG FLEXPEN) 100 UNIT/ML FlexPen Max daily 102 units 90 mL 3   insulin glargine (LANTUS SOLOSTAR) 100 UNIT/ML Solostar Pen Inject 40 Units into the skin daily. 45 mL 3   Insulin Pen Needle 30G X 5 MM MISC 1 Device by Does not apply route in the morning, at noon, in the evening, and at bedtime. 400 each 3   metFORMIN (GLUCOPHAGE) 1000 MG tablet Take 1 tablet (1,000 mg total) by mouth 2 (two) times daily with a meal. 180 tablet 3   metoprolol succinate (TOPROL-XL) 100 MG 24 hr tablet TAKE 1 TABLET BY MOUTH EVERY DAY 180 tablet 2   montelukast (SINGULAIR) 10 MG tablet TAKE 1 TABLET BY MOUTH EVERY DAY 90 tablet 1   nitroGLYCERIN (NITRODUR - DOSED IN MG/24 HR) 0.4 mg/hr patch Place 1 patch (0.4 mg total) onto the skin daily. 90 patch 2   nitroGLYCERIN (NITROSTAT) 0.4 MG SL tablet PLACE 1 TABLET UNDER THE TONGUE EVERY 5 MINUTES AS NEEDED FOR CHEST PAIN. 25 tablet 3   nortriptyline (PAMELOR) 50 MG capsule TAKE 1 CAPSULE BY MOUTH EVERYDAY AT BEDTIME 30 capsule 0   pantoprazole (PROTONIX) 40 MG tablet Take 1 tablet (40 mg total) by mouth daily. 30 tablet  0   pregabalin (LYRICA) 300 MG capsule Take 300 mg by mouth 2 (two) times daily.     ramipril (ALTACE) 5 MG capsule TAKE 2 CAPSULES (10 MG TOTAL) BY MOUTH DAILY. 180 capsule 3   No current facility-administered medications for this visit.    Allergies:   Ambien [zolpidem tartrate] and Shellfish allergy    Social History:  The patient  reports that he has never smoked. He has never used smokeless tobacco. He reports that he does not currently use alcohol. He reports that he does not use  drugs.   Family History:  [name] family history includes ALS in his maternal aunt and mother; Heart disease in his paternal grandfather and paternal grandmother; Lung cancer in his father.    ROS:   Please see the history of present illness.    (+) Acid Reflux (+) Palpitations (+) Chest pain (+) Shortness of breath (+) Bilateral ankle pain (+) Neuropathy (+) Imbalance All other systems are reviewed and negative.    PHYSICAL EXAM: VS:  BP 110/72 (BP Location: Left Arm, Patient Position: Sitting, Cuff Size: Large)   Pulse 77   Ht 6\' 2"  (1.88 m)   Wt 266 lb (120.7 kg)   BMI 34.15 kg/m  , BMI Body mass index is 34.15 kg/m. GENERAL:  Chronically ill-appearing HEENT:  Pupils equal round and reactive, fundi not visualized, oral mucosa unremarkable NECK:  No jugular venous distention, waveform within normal limits, carotid upstroke brisk and symmetric, no bruits LUNGS:  Clear to auscultation bilaterally HEART:  RRR.  PMI not displaced or sustained,S1 and S2 within normal limits, no S3, no S4, no clicks, no rubs, no murmurs ABD:  Flat, positive bowel sounds normal in frequency in pitch, no bruits, no rebound, no guarding, no midline pulsatile mass, no hepatomegaly, no splenomegaly EXT:  2 plus pulses throughout, no edema, no cyanosis no clubbing SKIN:  No rashes no nodules NEURO:  Cranial nerves II through XII grossly intact, motor grossly intact throughout PSYCH:  Cognitively intact, oriented  to person place and time  EKG:   07/11/19 demonstrates sinus rhythm.  Rate 85 bpm.   04/24/19: Sinus rhythm.  Rate 83 bpm.   04/21/2020: Sinus rhythm.  Rate 90 beats minute. 06/21/2021: Sinus Ryhthm. Rate 77 bpm.   Nuclear stress 04/18/18:  LVEF 53%.  No inducible ischemia.  Inferior defect consistent with diaphragmatic attenuation.  Echo 05/05/19: IMPRESSIONS    1. Left ventricular ejection fraction, by visual estimation, is 55 to  60%. The left ventricle has normal function. Normal left ventricular size.  There is no left ventricular hypertrophy.   2. Left ventricular diastolic Doppler parameters are consistent with  impaired relaxation pattern of LV diastolic filling.   3. The aortic valve is tricuspid Aortic valve regurgitation was not  visualized by color flow Doppler. Mild aortic valve sclerosis without  stenosis.   4. Global right ventricle has normal systolic function.The right  ventricular size is normal. No increase in right ventricular wall  thickness.   5. Right atrial size was normal.   6. Left atrial size was normal.   7. The inferior vena cava is normal in size with greater than 50%  respiratory variability, suggesting right atrial pressure of 3 mmHg.   8. The mitral valve is normal in structure. No evidence of mitral valve  regurgitation. No evidence of mitral stenosis.   9. The tricuspid valve is normal in structure. Tricuspid valve  regurgitation was not visualized by color flow Doppler.  10. TR signal is inadequate for assessing pulmonary artery systolic  pressure.    CT Chest 09/30/2020:  COMPARISON:  Chest x-ray 09/21/2020   FINDINGS: Cardiovascular: Heart is normal size. Minimal calcification in the region of the aortic valve. Calcified plaque over the left lateral circumflex coronary artery. Thoracic aorta is normal in caliber. Remaining vascular structures are unremarkable.   Mediastinum/Nodes: No mediastinal or hilar adenopathy. Remaining mediastinal  structures are unremarkable.   Lungs/Pleura: Lungs are well inflated without focal airspace consolidation or effusion. There is minimal linear atelectasis/scarring over the lung  bases, lingula and adjacent the minor fissure. Airways are normal.   Upper Abdomen: No acute findings.   Musculoskeletal: No focal abnormality.   IMPRESSION: 1. No acute cardiopulmonary disease. Minimal linear scarring/atelectasis over the mid to lower lungs. 2. Minimal atherosclerotic coronary artery disease. Minimal calcification in the region of the aortic valve.   Recent Labs: 10/12/2020: Hemoglobin 14.0; Platelets 319 06/21/2021: ALT 23; BUN 6; Creatinine, Ser 0.70; Potassium 4.3; Sodium 142    Lipid Panel    Component Value Date/Time   CHOL 114 06/21/2021 1445   TRIG 101 06/21/2021 1445   HDL 41 06/21/2021 1445   CHOLHDL 2.8 06/21/2021 1445   LDLCALC 54 06/21/2021 1445      Wt Readings from Last 3 Encounters:  06/21/21 266 lb (120.7 kg)  02/03/21 261 lb (118.4 kg)  11/03/20 266 lb 3.2 oz (120.7 kg)      ASSESSMENT AND PLAN:  CAD S/P percutaneous coronary angioplasty Mr. Wingerter has exertional dyspnea.  He has a prior MI and PCI.  He has chest pain that sounds more like GERD than ischemia.  However given his exertional dyspnea and his history, we will get a Lexiscan Myoview to assess for ischemia.  He is unable to walk on a treadmill due to bad peripheral neuropathy.  Continue aspirin, atorvastatin, and metoprolol.  Obstructive sleep apnea Continue CPAP.  Dyslipidemia, goal LDL below 70 We will check fasting lipids and a CMP today.  Continue atorvastatin.  History of stroke Continue aspirin and atorvastatin.  Exertional dyspnea He is struggling with shortness of breath with exertion.  He had an echo that was unremarkable.  His systolic function is actually improved and he appears euvolemic on exam.  We will get a Lexiscan Myoview to assess for ischemia as above.  I suspect some of this  is due to deconditioning.  He is unable to get any exercise due to severe peripheral neuropathy.  GERD (gastroesophageal reflux disease) Mr. Kampfer has severe reflux that is not well controlled.  I think this is was causing his chest pain.  He has Tums and dicyclomine.  We are going to start him on pantoprazole 30 mg daily for a month as a trial.  We also discussed not eating heavy foods prior to going to bed and limiting spicy foods.  We discussed elevating the head of his bed as well.    Current medicines are reviewed at length with the patient today.  The patient does not have concerns regarding medicines.  The following changes have been made:  none  Labs/ tests ordered today include:   Orders Placed This Encounter  Procedures   Lipid panel   Comprehensive metabolic panel   MYOCARDIAL PERFUSION IMAGING   EKG 12-Lead      Disposition:   FU with Marybella Ethier C. Oval Linsey, MD, [facility] in 1 month.   I,Mathew Stumpf,acting as a Education administrator for Skeet Latch, MD.,have documented all relevant documentation on the behalf of Skeet Latch, MD,as directed by  Skeet Latch, MD while in the presence of Skeet Latch, MD.   I, [place] Oval Linsey, MD have reviewed all documentation for this visit.  The documentation of the exam, diagnosis, procedures, and orders on 06/22/2021 are all accurate and complete.   Signed, Joh Rao C. Oval Linsey, MD, [facility]  06/22/2021 1:06 PM    [place]

## 2021-06-21 NOTE — Assessment & Plan Note (Signed)
We will check fasting lipids and a CMP today.  Continue atorvastatin.

## 2021-06-21 NOTE — Assessment & Plan Note (Signed)
Austin Vaughan has severe reflux that is not well controlled.  I think this is was causing his chest pain.  He has Tums and dicyclomine.  We are going to start him on pantoprazole 30 mg daily for a month as a trial.  We also discussed not eating heavy foods prior to going to bed and limiting spicy foods.  We discussed elevating the head of his bed as well.

## 2021-06-21 NOTE — Assessment & Plan Note (Signed)
He is struggling with shortness of breath with exertion.  He had an echo that was unremarkable.  His systolic function is actually improved and he appears euvolemic on exam.  We will get a Lexiscan Myoview to assess for ischemia as above.  I suspect some of this is due to deconditioning.  He is unable to get any exercise due to severe peripheral neuropathy.

## 2021-06-21 NOTE — Assessment & Plan Note (Signed)
Continue CPAP.  

## 2021-06-21 NOTE — Patient Instructions (Signed)
Medication Instructions:  START PANTOPRAZOLE 40 MG DAILY FOR 1 MONTH  OK TO TAKE AN EXTRA 1/2 METOPROLOL AS NEED FOR PALPITATIONS   *If you need a refill on your cardiac medications before your next appointment, please call your pharmacy*  Lab Work: LP/CMET TODAY   If you have labs (blood work) drawn today and your tests are completely normal, you will receive your results only by: MyChart Message (if you have MyChart) OR A paper copy in the mail If you have any lab test that is abnormal or we need to change your treatment, we will call you to review the results.  Testing/Procedures: Your physician has requested that you have a lexiscan myoview. For further information please visit [email or web address] Please follow instruction sheet, as given.  Follow-Up: At [facility], you and your health needs are our priority.  As part of our continuing mission to provide you with exceptional heart care, we have created designated Provider Care Teams.  These Care Teams include your primary Cardiologist (physician) and Advanced Practice Providers (APPs -  Physician Assistants and Nurse Practitioners) who all work together to provide you with the care you need, when you need it.  We recommend signing up for the patient portal called "MyChart".  Sign up information is provided on this After Visit Summary.  MyChart is used to connect with patients for Virtual Visits (Telemedicine).  Patients are able to view lab/test results, encounter notes, upcoming appointments, etc.  Non-urgent messages can be sent to your provider as well.   To learn more about what you can do with MyChart, go to [email or web address].    Your next appointment:   6 week(s)  The format for your next appointment:   In Person  Provider:   Gillian Shields, NP

## 2021-06-21 NOTE — Assessment & Plan Note (Signed)
Austin Vaughan has exertional dyspnea.  He has a prior MI and PCI.  He has chest pain that sounds more like GERD than ischemia.  However given his exertional dyspnea and his history, we will get a Lexiscan Myoview to assess for ischemia.  He is unable to walk on a treadmill due to bad peripheral neuropathy.  Continue aspirin, atorvastatin, and metoprolol.

## 2021-06-22 ENCOUNTER — Other Ambulatory Visit: Payer: Self-pay | Admitting: Critical Care Medicine

## 2021-06-22 ENCOUNTER — Encounter (HOSPITAL_BASED_OUTPATIENT_CLINIC_OR_DEPARTMENT_OTHER): Payer: Self-pay | Admitting: Cardiovascular Disease

## 2021-06-22 DIAGNOSIS — E1140 Type 2 diabetes mellitus with diabetic neuropathy, unspecified: Secondary | ICD-10-CM

## 2021-06-22 LAB — COMPREHENSIVE METABOLIC PANEL
ALT: 23 IU/L (ref 0–44)
AST: 14 IU/L (ref 0–40)
Albumin/Globulin Ratio: 1.7 (ref 1.2–2.2)
Albumin: 4.7 g/dL (ref 3.8–4.9)
Alkaline Phosphatase: 151 IU/L — ABNORMAL HIGH (ref 44–121)
BUN/Creatinine Ratio: 9 (ref 9–20)
BUN: 6 mg/dL (ref 6–24)
Bilirubin Total: 0.6 mg/dL (ref 0.0–1.2)
CO2: 22 mmol/L (ref 20–29)
Calcium: 9.5 mg/dL (ref 8.7–10.2)
Chloride: 103 mmol/L (ref 96–106)
Creatinine, Ser: 0.7 mg/dL — ABNORMAL LOW (ref 0.76–1.27)
Globulin, Total: 2.7 g/dL (ref 1.5–4.5)
Glucose: 259 mg/dL — ABNORMAL HIGH (ref 70–99)
Potassium: 4.3 mmol/L (ref 3.5–5.2)
Sodium: 142 mmol/L (ref 134–144)
Total Protein: 7.4 g/dL (ref 6.0–8.5)
eGFR: 111 mL/min/{1.73_m2} (ref >59)

## 2021-06-22 LAB — LIPID PANEL
Chol/HDL Ratio: 2.8 ratio (ref 0.0–5.0)
Cholesterol, Total: 114 mg/dL (ref 100–199)
HDL: 41 mg/dL (ref >39)
LDL Chol Calc (NIH): 54 mg/dL (ref 0–99)
Triglycerides: 101 mg/dL (ref 0–149)
VLDL Cholesterol Cal: 19 mg/dL (ref 5–40)

## 2021-06-23 ENCOUNTER — Encounter: Payer: Self-pay | Admitting: *Deleted

## 2021-06-23 NOTE — Telephone Encounter (Signed)
I sent this pt a MyChart message to call into [facility], gave him the number, and make an appt for his 6 month check. I'm sending the refill request for the Pamelor 50 mg to [facility] for provider to review for a refill since his 30 day courtesy dose was given in Oct. 2022.

## 2021-06-26 ENCOUNTER — Other Ambulatory Visit: Payer: Self-pay | Admitting: Cardiovascular Disease

## 2021-06-26 ENCOUNTER — Other Ambulatory Visit: Payer: Self-pay | Admitting: Critical Care Medicine

## 2021-06-27 ENCOUNTER — Ambulatory Visit (INDEPENDENT_AMBULATORY_CARE_PROVIDER_SITE_OTHER): Payer: Medicare Other | Admitting: Podiatry

## 2021-06-27 DIAGNOSIS — Z91199 Patient's noncompliance with other medical treatment and regimen due to unspecified reason: Secondary | ICD-10-CM

## 2021-06-27 NOTE — Telephone Encounter (Signed)
Rx(s) sent to pharmacy electronically.  

## 2021-06-27 NOTE — Telephone Encounter (Signed)
Requested medication (s) are due for refill today: Yes  Requested medication (s) are on the active medication list: Yes  Last refill:  10/21/20  Future visit scheduled: No  Notes to clinic:  Message sent to pt. To make appointment.    Requested Prescriptions  Pending Prescriptions Disp Refills   nitroGLYCERIN (NITRODUR - DOSED IN MG/24 HR) 0.4 mg/hr patch [Pharmacy Med Name: NITROGLYCERIN 0.4 MG/HR PATCH] 90 patch 2    Sig: Place 1 patch (0.4 mg total) onto the skin daily.     Cardiovascular:  Nitrates Passed - 06/26/2021  6:47 PM      Passed - Last BP in normal range    BP Readings from Last 1 Encounters:  06/21/21 110/72          Passed - Last Heart Rate in normal range    Pulse Readings from Last 1 Encounters:  06/21/21 77          Passed - Valid encounter within last 12 months    Recent Outpatient Visits           7 months ago Type 2 diabetes mellitus with diabetic polyneuropathy, with long-term current use of insulin (HCC)   [facility] Storm Frisk, MD   8 months ago Type 2 diabetes mellitus with diabetic polyneuropathy, with long-term current use of insulin (HCC)   [facility] Storm Frisk, MD   1 year ago Type 2 diabetes mellitus with hyperglycemia, with long-term current use of insulin (HCC)   [facility] [place], [place], MD   2 years ago Left-sided chest wall pain   [facility] [place], [place], MD   2 years ago Anxiety and depression   [facility] [place], [place], MD       Future Appointments             In 1 month Dan Humphreys, Storm Frisk, NP [facility]

## 2021-06-29 ENCOUNTER — Inpatient Hospital Stay (HOSPITAL_COMMUNITY): Admission: RE | Admit: 2021-06-29 | Payer: Medicare Other | Source: Ambulatory Visit

## 2021-06-30 ENCOUNTER — Telehealth (HOSPITAL_COMMUNITY): Payer: Self-pay | Admitting: *Deleted

## 2021-06-30 NOTE — Telephone Encounter (Signed)
Close encounter 

## 2021-07-01 ENCOUNTER — Ambulatory Visit (HOSPITAL_COMMUNITY)
Admission: RE | Admit: 2021-07-01 | Discharge: 2021-07-01 | Disposition: A | Discharge status: Home / Self Care | Payer: Medicare Other | Source: Ambulatory Visit | Attending: Cardiovascular Disease | Admitting: Cardiovascular Disease

## 2021-07-01 ENCOUNTER — Inpatient Hospital Stay (HOSPITAL_COMMUNITY): Admission: RE | Admit: 2021-07-01 | Payer: Medicare Other | Source: Ambulatory Visit

## 2021-07-01 ENCOUNTER — Encounter (HOSPITAL_BASED_OUTPATIENT_CLINIC_OR_DEPARTMENT_OTHER): Payer: Self-pay | Admitting: Cardiovascular Disease

## 2021-07-01 ENCOUNTER — Other Ambulatory Visit: Payer: Self-pay

## 2021-07-01 DIAGNOSIS — R0609 Other forms of dyspnea: Secondary | ICD-10-CM | POA: Diagnosis present

## 2021-07-01 DIAGNOSIS — Z9861 Coronary angioplasty status: Secondary | ICD-10-CM | POA: Diagnosis present

## 2021-07-01 DIAGNOSIS — I2510 Atherosclerotic heart disease of native coronary artery without angina pectoris: Secondary | ICD-10-CM | POA: Diagnosis present

## 2021-07-01 DIAGNOSIS — R079 Chest pain, unspecified: Secondary | ICD-10-CM | POA: Diagnosis present

## 2021-07-01 LAB — MYOCARDIAL PERFUSION IMAGING
LV dias vol: 135 mL (ref 62–150)
LV sys vol: 58 mL
Nuc Stress EF: 57 %
Peak HR: 92 {beats}/min
Rest HR: 76 {beats}/min
Rest Nuclear Isotope Dose: 8.6 mCi
SDS: 1
SRS: 4
SSS: 5
ST Depression (mm): 0 mm
Stress Nuclear Isotope Dose: 24.9 mCi
TID: 1.05

## 2021-07-01 MED ORDER — TECHNETIUM TC 99M TETROFOSMIN IV KIT
24.9000 | PACK | Freq: Once | INTRAVENOUS | Status: AC | PRN
  Administered 2021-07-01: 24.9 via INTRAVENOUS
  Filled 2021-07-01: qty 25

## 2021-07-01 MED ORDER — TECHNETIUM TC 99M TETROFOSMIN IV KIT
8.6000 | PACK | Freq: Once | INTRAVENOUS | Status: AC | PRN
  Administered 2021-07-01: 8.6 via INTRAVENOUS
  Filled 2021-07-01: qty 9

## 2021-07-01 MED ORDER — REGADENOSON 0.4 MG/5ML IV SOLN
0.4000 mg | Freq: Once | INTRAVENOUS | Status: AC
  Administered 2021-07-01: 0.4 mg via INTRAVENOUS

## 2021-07-18 ENCOUNTER — Ambulatory Visit (INDEPENDENT_AMBULATORY_CARE_PROVIDER_SITE_OTHER): Payer: Medicare Other | Admitting: Podiatry

## 2021-07-18 DIAGNOSIS — Z91199 Patient's noncompliance with other medical treatment and regimen due to unspecified reason: Secondary | ICD-10-CM

## 2021-07-18 NOTE — Progress Notes (Signed)
Same day cancellation - sick °

## 2021-07-28 ENCOUNTER — Encounter: Payer: Self-pay | Admitting: Podiatry

## 2021-07-28 ENCOUNTER — Ambulatory Visit (INDEPENDENT_AMBULATORY_CARE_PROVIDER_SITE_OTHER): Payer: Medicare Other | Admitting: Podiatry

## 2021-07-28 DIAGNOSIS — M79676 Pain in unspecified toe(s): Secondary | ICD-10-CM

## 2021-07-28 DIAGNOSIS — L600 Ingrowing nail: Secondary | ICD-10-CM | POA: Diagnosis not present

## 2021-07-28 MED ORDER — SILVER SULFADIAZINE 1 % EX CREA: TOPICAL_CREAM | CUTANEOUS | 0 refills | Status: DC

## 2021-07-28 NOTE — Progress Notes (Signed)
°  Subjective:  Patient ID: Austin Vaughan, male    DOB: 06/17/1971,  MRN: [id number]  Chief Complaint  Patient presents with   Nail Problem    I am a diabetic and the toenails sometimes bleeds and the last time I was here the toenails have really not growed out and the doctor did give me a cream and it worked really well   50 y.o. male presents with the above complaint. History confirmed with patient.  Objective:  Physical Exam: warm, good capillary refill, no trophic changes or ulcerative lesions, normal DP and PT pulses, and absent protective sensation Painful ingrowing nail at lateral border of the right, hallux; without warmth, erythema or drainage with small nail bed wound.   Assessment:   1. Ingrown nail   2. Pain around toenail    Plan:  Patient was evaluated and treated and all questions answered.  Ingrown Nail, right -Small area of residual ingrown nail lateral border. This was removed today with nail nipper. -Silvadene and band-aid applied. Rx silvadene for patient to use daily until healed. -Advise [place] if this does not fully heal in 2-3 weeks.  Return if symptoms worsen or fail to improve.

## 2021-07-30 ENCOUNTER — Other Ambulatory Visit: Payer: Self-pay | Admitting: Critical Care Medicine

## 2021-07-31 NOTE — Telephone Encounter (Signed)
Requested Prescriptions  Pending Prescriptions Disp Refills   montelukast (SINGULAIR) 10 MG tablet [Pharmacy Med Name: MONTELUKAST SOD 10 MG TABLET] 90 tablet 0    Sig: TAKE 1 TABLET BY MOUTH EVERY DAY     Pulmonology:  Leukotriene Inhibitors Passed - 07/30/2021  5:16 PM      Passed - Valid encounter within last 12 months    Recent Outpatient Visits          9 months ago Type 2 diabetes mellitus with diabetic polyneuropathy, with long-term current use of insulin (HCC)   [place] [facility] Storm Frisk, MD   9 months ago Type 2 diabetes mellitus with diabetic polyneuropathy, with long-term current use of insulin (HCC)   [facility] Storm Frisk, MD   1 year ago Type 2 diabetes mellitus with hyperglycemia, with long-term current use of insulin (HCC)   [facility] [place], [place], MD   2 years ago Left-sided chest wall pain   [facility] [place], [place], MD   2 years ago Anxiety and depression   [facility] [place], [place], MD      Future Appointments            In 2 days Alver Sorrow, NP [facility]   In 3 weeks Delford Field Charlcie Cradle, MD [facility]

## 2021-08-01 NOTE — Progress Notes (Deleted)
Office Visit    Patient Name: Austin Vaughan Date of Encounter: 08/01/2021  PCP:  Storm FriskWright, Austin E, MD   [facility]  Cardiologist:  Austin Siiffany Yoder, MD  Advanced Practice Provider:  No care team member to display Electrophysiologist:  None   Chief Complaint    Austin Vaughan is a 50 y.o. male with a hx of hypertension, diabetes, NSVT, and CAD status post PCI presents today for a follow-up appointment.  Past Medical History    Past Medical History:  Diagnosis Date   Coronary artery disease    Diabetes mellitus without complication (HCC)    Diabetic foot ulcer (HCC) 08/23/2019   GERD (gastroesophageal reflux disease) 06/21/2021   Hypertension    Past Surgical History:  Procedure Laterality Date   CORONARY ANGIOPLASTY WITH STENT PLACEMENT  07/2002    Allergies  Allergies  Allergen Reactions   Ambien [Zolpidem Tartrate] Other (See Comments)    HALLUCINATIONS   Shellfish Allergy Anaphylaxis    Seafood    History of Present Illness    Austin Vaughan is a 50 y.o. male with a hx of hypertension, diabetes, NSVT, and CAD status post PCI presents today for a follow-up appointment last seen 06/21/2021 by Dr. Chilton Siiffany Vaughan.  He was treated for an MI in 2013 while [place] in [place]  He presented with bilateral arm and chest pain.  He underwent a PCI and an unknown vessel and reports that his LVEF was 47%.  Prior to moving to [place] he had a nuclear stress test 04/2018 that revealed no ischemia and LVEF 53%.  He moved to [place] to be closer to his daughter-in-law who recently had a baby.  At his initial appointment he complained of intermittent chest pain.  It was noted that he was not using nitroglycerin tabs daily.  It was recommended that he start to use it each day.  He followed up with Austin BarthelLuke Wright, PA-C on 10/2020 and reported palpitations.  He had a 14-day Zio patch that showed short runs of PSVT.  Metoprolol was increased to 50  mg.  He followed up with Austin GaviaAngie Duke, PA-C on 7/30 and reported pleuritic chest pain that was inconsistent with ACS.  [facility] and it was confirmed that he does not have ALS.  He has been suffering with severe neuropathic pain.  [name] reported frequent palpitations at his last appointment a month ago.  His symptoms have not improved since increasing metoprolol.  He wore an ambulatory monitor that revealed rare PACs and PVCs and some short runs of SVT.  Metoprolol was increased to 75 mg daily.  He does not think this helped much.  So, it was increased to 200 mg.  He reported chest tightness and shortness of breath.  He had an echocardiogram 04/2019 which revealed LVEF 55 to 60% with grade 1 diastolic dysfunction.  There was aortic valve sclerosis without stenosis.  Metoprolol was also increased.  He had chronic limb ischemia and gangrene of the right foot.  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  He followed with the wound care center and his wound healed completely with a topical cream.  He then called the office 05/2021 with chest pain and he had been taking his nitroglycerin.  He says he has been having chest pain and he has been using patches and medication to manage his pain.  His chest pain lasts consistently for about 30 minutes.  Sometimes he notices it when he is sitting down watching TV.  He describes it as sharp chest pain.  When he presses on his chest tightly the pain will stop.  He has over-the-counter medication for his acid reflux and eats spicy foods.  Yesterday, he was having a lot of palpitations that happen at night before he went to sleep.  He said "it feels like he had bubbles."  He said the palpitations lasted for about an hour.  He gets out of breath when doing simple tasks like putting on his shoes and putting on his close.  He has not been exercising lately due to his ankle pain and he uses a cane to keep his balance.  However, he denies exertional  chest pain.  At home, he regularly checks his blood pressure.  He states his blood pressure is not that high at home.  Typically, his diet includes spicy foods.  He denies any lightheadedness, headaches, syncope, orthopnea, PND, or lower extremity edema.  Today, he ***   EKGs/Labs/Other Studies Reviewed:   The following studies were reviewed today:  Lexiscan Myoview 07/01/2021    The study is normal. The study is low risk.   No ST deviation was noted.   Left ventricular function is normal. End diastolic cavity size is normal. End systolic cavity size is normal.   Prior study available for comparison from 04/19/2018.   Normal stress nuclear study with no ischemia or infarction.  Gated ejection fraction 57% with normal wall motion.     Nuclear stress 04/18/18:  LVEF 53%.  No inducible ischemia.  Inferior defect consistent with diaphragmatic attenuation.   Echo 05/05/19: IMPRESSIONS    1. Left ventricular ejection fraction, by visual estimation, is 55 to  60%. The left ventricle has normal function. Normal left ventricular size.  There is no left ventricular hypertrophy.   2. Left ventricular diastolic Doppler parameters are consistent with  impaired relaxation pattern of LV diastolic filling.   3. The aortic valve is tricuspid Aortic valve regurgitation was not  visualized by color flow Doppler. Mild aortic valve sclerosis without  stenosis.   4. Global right ventricle has normal systolic function.The right  ventricular size is normal. No increase in right ventricular wall  thickness.   5. Right atrial size was normal.   6. Left atrial size was normal.   7. The inferior vena cava is normal in size with greater than 50%  respiratory variability, suggesting right atrial pressure of 3 mmHg.   8. The mitral valve is normal in structure. No evidence of mitral valve  regurgitation. No evidence of mitral stenosis.   9. The tricuspid valve is normal in structure. Tricuspid valve   regurgitation was not visualized by color flow Doppler.  10. TR signal is inadequate for assessing pulmonary artery systolic  pressure.      CT Chest 09/30/2020:  COMPARISON:  Chest x-ray 09/21/2020   FINDINGS: Cardiovascular: Heart is normal size. Minimal calcification in the region of the aortic valve. Calcified plaque over the left lateral circumflex coronary artery. Thoracic aorta is normal in caliber. Remaining vascular structures are unremarkable.   Mediastinum/Nodes: No mediastinal or hilar adenopathy. Remaining mediastinal structures are unremarkable.   Lungs/Pleura: Lungs are well inflated without focal airspace consolidation or effusion. There is minimal linear atelectasis/scarring over the lung bases, lingula and adjacent the minor fissure. Airways are normal.   Upper Abdomen: No acute findings.   Musculoskeletal: No focal abnormality.   IMPRESSION: 1. No acute cardiopulmonary  disease. Minimal linear scarring/atelectasis over the mid to lower lungs. 2. Minimal atherosclerotic coronary artery disease. Minimal calcification in the region of the aortic valve.      EKG:  EKG is *** ordered today.  The ekg ordered today demonstrates ***  Recent Labs: 10/12/2020: Hemoglobin 14.0; Platelets 319 06/21/2021: ALT 23; BUN 6; Creatinine, Ser 0.70; Potassium 4.3; Sodium 142  Recent Lipid Panel    Component Value Date/Time   CHOL 114 06/21/2021 1445   TRIG 101 06/21/2021 1445   HDL 41 06/21/2021 1445   CHOLHDL 2.8 06/21/2021 1445   LDLCALC 54 06/21/2021 1445    Risk Assessment/Calculations:  {Does this patient have ATRIAL FIBRILLATION?[phone]}  Home Medications   No outpatient medications have been marked as taking for the 08/02/21 encounter (Appointment) with Elgie Collard, PA-C.     Review of Systems   ***   All other systems reviewed and are otherwise negative except as noted above.  Physical Exam    VS:  There were no vitals taken for this visit. ,  BMI There is no height or weight on file to calculate BMI.  Wt Readings from Last 3 Encounters:  07/01/21 266 lb (120.7 kg)  06/21/21 266 lb (120.7 kg)  02/03/21 261 lb (118.4 kg)     GEN: Well nourished, well developed, in no acute distress. HEENT: normal. Neck: Supple, no JVD, carotid bruits, or masses. Cardiac: ***RRR, no murmurs, rubs, or gallops. No clubbing, cyanosis, edema.  ***Radials/PT 2+ and equal bilaterally.  Respiratory:  ***Respirations regular and unlabored, clear to auscultation bilaterally. GI: Soft, nontender, nondistended. MS: No deformity or atrophy. Skin: Warm and dry, no rash. Neuro:  Strength and sensation are intact. Psych: Normal affect.  Assessment & Plan    CAD status post PCI -Recent stress test normal, low risk -Continue GDMT: ASA, atorvastatin, metoprolol  Obstructive sleep apnea -Tolerating CPAP  Dyslipidemia, goal LDL below 70 -Recent lipid panel 06/21/2021 showed total cholesterol 114, HDL 41, LDL 54, and triglycerides 101 -Continue atorvastatin 80 mg daily  History of stroke -Continue aspirin and atorvastatin  Exertional dyspnea -Recent echocardiogram was unremarkable, his systolic function has actually improved -Euvolemic on exam -Lexiscan Myoview low risk for ischemia -Suspect his shortness of breath is due to deconditioning  GERD -Severe reflux that is not well controlled and may be the cause of his chest pain -He was started on pantoprazole 30 mg daily -Avoid spicy foods and eating heavy meals prior to bed -Elevate the head of your bed at night  Disposition: Follow up in 6 month(s) with Skeet Latch, MD or APP.  Signed, Elgie Collard, PA-C 08/01/2021, 6:59 PM [place]

## 2021-08-02 ENCOUNTER — Ambulatory Visit (HOSPITAL_BASED_OUTPATIENT_CLINIC_OR_DEPARTMENT_OTHER): Payer: Medicare Other | Admitting: Physician Assistant

## 2021-08-02 ENCOUNTER — Encounter (HOSPITAL_BASED_OUTPATIENT_CLINIC_OR_DEPARTMENT_OTHER): Payer: Self-pay

## 2021-08-19 ENCOUNTER — Encounter: Payer: Self-pay | Admitting: Critical Care Medicine

## 2021-08-23 ENCOUNTER — Encounter: Payer: Self-pay | Admitting: Critical Care Medicine

## 2021-08-23 ENCOUNTER — Ambulatory Visit: Payer: Medicare Other | Attending: Critical Care Medicine | Admitting: Critical Care Medicine

## 2021-08-23 ENCOUNTER — Other Ambulatory Visit: Payer: Self-pay

## 2021-08-23 VITALS — BP 119/76 | HR 81 | Resp 16 | Wt 264.0 lb

## 2021-08-23 DIAGNOSIS — I5042 Chronic combined systolic (congestive) and diastolic (congestive) heart failure: Secondary | ICD-10-CM

## 2021-08-23 DIAGNOSIS — E11630 Type 2 diabetes mellitus with periodontal disease: Secondary | ICD-10-CM | POA: Diagnosis not present

## 2021-08-23 DIAGNOSIS — E1140 Type 2 diabetes mellitus with diabetic neuropathy, unspecified: Secondary | ICD-10-CM

## 2021-08-23 DIAGNOSIS — I110 Hypertensive heart disease with heart failure: Secondary | ICD-10-CM | POA: Diagnosis not present

## 2021-08-23 DIAGNOSIS — J4530 Mild persistent asthma, uncomplicated: Secondary | ICD-10-CM

## 2021-08-23 DIAGNOSIS — R0600 Dyspnea, unspecified: Secondary | ICD-10-CM

## 2021-08-23 DIAGNOSIS — G4710 Hypersomnia, unspecified: Secondary | ICD-10-CM

## 2021-08-23 DIAGNOSIS — Z794 Long term (current) use of insulin: Secondary | ICD-10-CM | POA: Diagnosis not present

## 2021-08-23 DIAGNOSIS — E1142 Type 2 diabetes mellitus with diabetic polyneuropathy: Secondary | ICD-10-CM

## 2021-08-23 DIAGNOSIS — G63 Polyneuropathy in diseases classified elsewhere: Secondary | ICD-10-CM

## 2021-08-23 DIAGNOSIS — I10 Essential (primary) hypertension: Secondary | ICD-10-CM

## 2021-08-23 DIAGNOSIS — K219 Gastro-esophageal reflux disease without esophagitis: Secondary | ICD-10-CM

## 2021-08-23 LAB — POCT GLYCOSYLATED HEMOGLOBIN (HGB A1C): HbA1c, POC (controlled diabetic range): 9.3 % — AB (ref 0.0–7.0)

## 2021-08-23 LAB — GLUCOSE, POCT (MANUAL RESULT ENTRY): POC Glucose: 273 mg/dl — AB (ref 70–99)

## 2021-08-23 MED ORDER — RAMIPRIL 5 MG PO CAPS
ORAL_CAPSULE | ORAL | 3 refills | Status: DC
Start: 2021-08-23 — End: 2022-02-22

## 2021-08-23 MED ORDER — NITROGLYCERIN 0.4 MG/HR TD PT24: 0.4000 mg | MEDICATED_PATCH | Freq: Every day | TRANSDERMAL | 2 refills | Status: DC

## 2021-08-23 MED ORDER — NOVOLOG FLEXPEN 100 UNIT/ML ~~LOC~~ SOPN
PEN_INJECTOR | SUBCUTANEOUS | 3 refills | Status: DC
Start: 2021-08-23 — End: 2022-02-22

## 2021-08-23 MED ORDER — FREESTYLE LIBRE 14 DAY SENSOR MISC: 1.0000 | 11 refills | Status: DC

## 2021-08-23 MED ORDER — HYDROXYZINE PAMOATE 25 MG PO CAPS
25.0000 mg | ORAL_CAPSULE | Freq: Three times a day (TID) | ORAL | 0 refills | Status: AC | PRN
Start: 2021-08-23 — End: ?

## 2021-08-23 MED ORDER — METFORMIN HCL 1000 MG PO TABS: 1000.0000 mg | ORAL_TABLET | Freq: Two times a day (BID) | ORAL | 3 refills | Status: DC

## 2021-08-23 MED ORDER — NITROGLYCERIN 0.4 MG SL SUBL: SUBLINGUAL_TABLET | SUBLINGUAL | 3 refills | Status: AC

## 2021-08-23 MED ORDER — PANTOPRAZOLE SODIUM 40 MG PO TBEC
40.0000 mg | DELAYED_RELEASE_TABLET | Freq: Every day | ORAL | 3 refills | Status: DC
Start: 2021-08-23 — End: 2022-02-22

## 2021-08-23 MED ORDER — DAPAGLIFLOZIN PROPANEDIOL 10 MG PO TABS
10.0000 mg | ORAL_TABLET | Freq: Every day | ORAL | 3 refills | Status: DC
Start: 2021-08-23 — End: 2022-02-22

## 2021-08-23 MED ORDER — ATORVASTATIN CALCIUM 80 MG PO TABS: 80.0000 mg | ORAL_TABLET | Freq: Every day | ORAL | 3 refills | Status: DC

## 2021-08-23 MED ORDER — CETIRIZINE HCL 10 MG PO TABS: 10.0000 mg | ORAL_TABLET | Freq: Every day | ORAL | 11 refills | Status: AC

## 2021-08-23 MED ORDER — LANTUS SOLOSTAR 100 UNIT/ML ~~LOC~~ SOPN: 40.0000 [IU] | PEN_INJECTOR | Freq: Every day | SUBCUTANEOUS | 3 refills | Status: DC

## 2021-08-23 MED ORDER — NORTRIPTYLINE HCL 50 MG PO CAPS: ORAL_CAPSULE | ORAL | 0 refills | Status: DC

## 2021-08-23 NOTE — Assessment & Plan Note (Signed)
Blood pressure is at goal, no changes to plan at this time.

## 2021-08-23 NOTE — Assessment & Plan Note (Signed)
Referral for sleep study

## 2021-08-23 NOTE — Assessment & Plan Note (Signed)
Continue with pantoprazole as directed by cardiology.

## 2021-08-23 NOTE — Assessment & Plan Note (Signed)
Continue regular follow up with dentistry.

## 2021-08-23 NOTE — Progress Notes (Signed)
Established Patient Office Visit  Subjective:  Patient ID: Austin Vaughan, male    DOB: 1971/08/07  Age: 51 y.o. MRN: [id number]  CC:  Chief Complaint  Patient presents with   Diabetes   Medication Refill    HPI [name] presents for primary care follow up. He was last contacted by this office in August 2022 for COVID-19 infection, for which he was prescribed Molnupiravir. He has no residual symptoms from that at this time.  At this point, his diabetes remains poorly controlled. He sees endocrinologist Dr. Kelton Pillar who provides most of the management for his diabetes. He uses the [employer] to track his blood sugar, but sometimes leaves the device off, so most of the blood sugar readings have been from the afternoon and evening and are typically in the 200s. Per his monitor, his fasting blood sugar appears to be in the 170s. He has been taking his long-acting insulin as prescribed at night. He has been prescribed short-acting insulin, but only takes it twice a day, as he wakes up in the late morning and does not eat breakfast. His diet mostly includes chicken, steak, rice, and beans. He does not regularly get any sort of physical activity. At today's visit, his point of care HgBA1c was 9.3%, with CBG 273 up from his visit to this clinic six months ago when it was 8.8%.  The patient saw cardiology in November due to some persistent chest pain. At that time, it was determined that he had some severe reflux. He began pantoprazole for this which he states has helped greatly. Multiple labs were also ordered by cardiology at that time. Cholesterol was at therapeutic goal, and kidney, liver, and electrolytes were all in normal ranges. Glucose was also very elevated at that time.   The patient has also followed up with other members of his care team. He saw podiatry in December 2022 and received ingrown toenail removal. He saw dentistry about six months ago and had some teeth extracted. He plans  to follow up with them again for additional extractions. He also states that he has seen ophthalmology about 6 months ago  Today the patient complains of continued fatigue and feelings of not being well-rested. He admits to occasionally waking up at night, gasping for air. He states he has had a sleep study in the past, but that was quite a few years ago when he lived in [place]. His only other complaint is a feeling of itching, mostly on his legs and scalp. He has not noticed any rashes despite the itching.   The patient is up to date on all vaccinations and age-appropriate screenings.  Past Medical History:  Diagnosis Date   Coronary artery disease    Diabetes mellitus without complication ([place])    Diabetic foot ulcer ([place]) 08/23/2019   GERD (gastroesophageal reflux disease) 06/21/2021   Hypertension     Past Surgical History:  Procedure Laterality Date   CORONARY ANGIOPLASTY WITH STENT PLACEMENT  07/2002    Family History  Problem Relation Age of Onset   ALS Mother    Lung cancer Father    ALS Maternal Aunt    Heart disease Paternal Grandmother    Heart disease Paternal Grandfather     Social History   Socioeconomic History   Marital status: Married    Spouse name: Not on file   Number of children: 2   Years of education: 12   Highest education level: Not on file  Occupational  History   Occupation: disablitiy  Tobacco Use   Smoking status: Never   Smokeless tobacco: Never  Vaping Use   Vaping Use: Never used  Substance and Sexual Activity   Alcohol use: Not Currently   Drug use: Never   Sexual activity: Yes  Other Topics Concern   Not on file  Social History Narrative   Wife and patient relocated to  from [place] a few months ago. - 06/11/18   He is on disability since 2019.     He lives with wife and daughter in a one-level apartment.    Right handed   Social Determinants of Health   Financial Resource Strain: Not on file  Food Insecurity: Not on  file  Transportation Needs: Not on file  Physical Activity: Not on file  Stress: Not on file  Social Connections: Not on file  Intimate Partner Violence: Not on file    Outpatient Medications Prior to Visit  Medication Sig Dispense Refill   albuterol (PROAIR HFA) 108 (90 Base) MCG/ACT inhaler Inhale 2 puffs into the lungs every 6 (six) hours as needed for wheezing or shortness of breath. 1 each 11   aspirin EC 81 MG tablet Take 81 mg by mouth daily.     Continuous Blood Gluc Receiver (FREESTYLE LIBRE 14 DAY READER) DEVI 1 Device by Does not apply route as directed. 1 each 0   dicyclomine (BENTYL) 20 MG tablet Take 1 tablet (20 mg total) by mouth 4 (four) times daily as needed (abdominal cramping). 120 tablet 0   gabapentin (NEURONTIN) 600 MG tablet Take 6 tablets by mouth at bedtime.     metoprolol succinate (TOPROL-XL) 100 MG 24 hr tablet TAKE 1 TABLET BY MOUTH EVERY DAY 180 tablet 3   montelukast (SINGULAIR) 10 MG tablet TAKE 1 TABLET BY MOUTH EVERY DAY 90 tablet 0   pregabalin (LYRICA) 300 MG capsule Take 300 mg by mouth 2 (two) times daily.     silver sulfADIAZINE (SILVADENE) 1 % cream Apply pea-sized amount to wound daily. 50 g 0   atorvastatin (LIPITOR) 80 MG tablet TAKE 1 TABLET (80 MG TOTAL) BY MOUTH DAILY. 30 tablet 3   cetirizine (ZYRTEC) 10 MG tablet Take 1 tablet (10 mg total) by mouth daily. 30 tablet 11   Continuous Blood Gluc Sensor (FREESTYLE LIBRE 14 DAY SENSOR) MISC 1 Device by Does not apply route as directed. 2 each 11   dapagliflozin propanediol (FARXIGA) 10 MG TABS tablet Take 1 tablet (10 mg total) by mouth daily. 90 tablet 3   insulin aspart (NOVOLOG FLEXPEN) 100 UNIT/ML FlexPen Max daily 102 units 90 mL 3   insulin glargine (LANTUS SOLOSTAR) 100 UNIT/ML Solostar Pen Inject 40 Units into the skin daily. 45 mL 3   metFORMIN (GLUCOPHAGE) 1000 MG tablet Take 1 tablet (1,000 mg total) by mouth 2 (two) times daily with a meal. 180 tablet 3   nitroGLYCERIN (NITRODUR -  DOSED IN MG/24 HR) 0.4 mg/hr patch Place 1 patch (0.4 mg total) onto the skin daily. 90 patch 2   nitroGLYCERIN (NITROSTAT) 0.4 MG SL tablet PLACE 1 TABLET UNDER THE TONGUE EVERY 5 MINUTES AS NEEDED FOR CHEST PAIN. 25 tablet 3   nortriptyline (PAMELOR) 50 MG capsule TAKE 1 CAPSULE BY MOUTH EVERYDAY AT BEDTIME 30 capsule 0   pantoprazole (PROTONIX) 40 MG tablet TAKE 1 TABLET BY MOUTH EVERY DAY 90 tablet 3   ramipril (ALTACE) 5 MG capsule TAKE 2 CAPSULES (10 MG TOTAL) BY MOUTH DAILY. 180 capsule  3   glucose blood test strip 4x daily (Patient not taking: Reported on 08/23/2021) 150 each 12   Insulin Pen Needle 30G X 5 MM MISC 1 Device by Does not apply route in the morning, at noon, in the evening, and at bedtime. 400 each 3   No facility-administered medications prior to visit.    Allergies  Allergen Reactions   Ambien [Zolpidem Tartrate] Other (See Comments)    HALLUCINATIONS   Shellfish Allergy Anaphylaxis    Seafood    ROS Review of Systems  Constitutional:  Positive for fatigue.  HENT:  Negative for hearing loss, rhinorrhea and trouble swallowing.   Eyes: Negative.   Respiratory:  Positive for shortness of breath (wakes up at night gasping for breath).   Cardiovascular:  Positive for leg swelling (occasional foot swelling). Negative for palpitations.  Gastrointestinal:  Negative for constipation, diarrhea, nausea and vomiting.  Endocrine: Positive for polydipsia.  Genitourinary:  Positive for frequency.  Musculoskeletal:  Positive for arthralgias (pain in feet, pain in hands).  Skin:  Positive for rash (skin itching, head and legs).  Neurological:  Positive for numbness (pain in hands). Negative for headaches.  Psychiatric/Behavioral:  Positive for sleep disturbance (sleep is fine, except for waking with gasping).      Objective:    Physical Exam Constitutional:      Appearance: Normal appearance. He is obese.  HENT:     Head: Normocephalic and atraumatic.     Mouth/Throat:      Mouth: Mucous membranes are moist.     Dentition: Abnormal dentition (extractions present).     Pharynx: Oropharynx is clear.  Cardiovascular:     Rate and Rhythm: Normal rate and regular rhythm.     Heart sounds: Normal heart sounds. No murmur heard.   No friction rub. No gallop.  Pulmonary:     Effort: Pulmonary effort is normal.     Breath sounds: Normal breath sounds. No wheezing, rhonchi or rales.  Skin:    General: Skin is warm and dry.  Neurological:     General: No focal deficit present.     Mental Status: He is alert and oriented to person, place, and time.  Psychiatric:        Mood and Affect: Mood normal.        Behavior: Behavior normal.        Thought Content: Thought content normal.    BP 119/76    Pulse 81    Resp 16    Wt 264 lb (119.7 kg)    SpO2 94%    BMI 33.90 kg/m  Wt Readings from Last 3 Encounters:  08/23/21 264 lb (119.7 kg)  07/01/21 266 lb (120.7 kg)  06/21/21 266 lb (120.7 kg)     Health Maintenance Due  Topic Date Due   Zoster Vaccines- Shingrix (1 of 2) Never done    There are no preventive care reminders to display for this patient.  Lab Results  Component Value Date   TSH 1.570 04/21/2020   Lab Results  Component Value Date   WBC 7.2 10/12/2020   HGB 14.0 10/12/2020   HCT 44.0 10/12/2020   MCV 89 10/12/2020   PLT 319 10/12/2020   Lab Results  Component Value Date   NA 142 06/21/2021   K 4.3 06/21/2021   CO2 22 06/21/2021   GLUCOSE 259 (H) 06/21/2021   BUN 6 06/21/2021   CREATININE 0.70 (L) 06/21/2021   BILITOT 0.6 06/21/2021   ALKPHOS 151 (H)  06/21/2021   AST 14 06/21/2021   ALT 23 06/21/2021   PROT 7.4 06/21/2021   ALBUMIN 4.7 06/21/2021   CALCIUM 9.5 06/21/2021   ANIONGAP 10 08/25/2019   EGFR 111 06/21/2021   GFR 113.85 10/17/2018   Lab Results  Component Value Date   CHOL 114 06/21/2021   Lab Results  Component Value Date   HDL 41 06/21/2021   Lab Results  Component Value Date   LDLCALC 54 06/21/2021    Lab Results  Component Value Date   TRIG 101 06/21/2021   Lab Results  Component Value Date   CHOLHDL 2.8 06/21/2021   Lab Results  Component Value Date   HGBA1C 9.3 (A) 08/23/2021      Assessment & Plan:   Problem List Items Addressed This Visit       Cardiovascular and Mediastinum   Essential hypertension    Blood pressure is at goal, no changes to plan at this time.       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      Relevant Medications   atorvastatin (LIPITOR) 80 MG tablet   dapagliflozin propanediol (FARXIGA) 10 MG TABS tablet   insulin aspart (NOVOLOG FLEXPEN) 100 UNIT/ML FlexPen   insulin glargine (LANTUS SOLOSTAR) 100 UNIT/ML Solostar Pen   metFORMIN (GLUCOPHAGE) 1000 MG tablet   ramipril (ALTACE) 5 MG capsule   GERD (gastroesophageal reflux disease)    Continue with pantoprazole as directed by cardiology.       Relevant Medications   pantoprazole (PROTONIX) 40 MG tablet     Endocrine   Type 2 diabetes mellitus with diabetic polyneuropathy, with long-term current use of insulin (HCC) - Primary    Type 2 diabetes is poorly controlled at this time. An extended conversation was held with the patient to discuss the importance of three meals per day, In order to properly dose his short-acting insulin. Appropriate diet was discussed at length. Referral made to  registered dietitian for assistance in formulating appropriate meal plan.       Relevant Medications   atorvastatin (LIPITOR) 80 MG tablet   dapagliflozin propanediol (FARXIGA) 10 MG TABS tablet   insulin aspart (NOVOLOG FLEXPEN) 100 UNIT/ML FlexPen   insulin glargine (LANTUS SOLOSTAR) 100 UNIT/ML Solostar Pen   metFORMIN (GLUCOPHAGE) 1000 MG tablet   nortriptyline (PAMELOR) 50 MG capsule   ramipril (ALTACE) 5 MG capsule   hydrOXYzine (VISTARIL) 25 MG capsule   Other Relevant Orders   HgB A1c (Completed)   POCT glucose (manual entry) (Completed)     Nervous and Auditory   Polyneuropathy associated with underlying disease (HCC)   Relevant Medications   nortriptyline (PAMELOR) 50 MG capsule   hydrOXYzine (VISTARIL) 25 MG capsule     Other   Nocturnal dyspnea    Referral for sleep study provided today, evaluate  for OSA.      Relevant Orders   PSG Sleep Study   Hypersomnolence    Referral for sleep study      Relevant Orders   PSG Sleep Study   Other Visit Diagnoses     Mild persistent asthma, unspecified whether complicated       Relevant Medications   cetirizine (ZYRTEC) 10 MG tablet   Type 2 diabetes mellitus with diabetic neuropathy, unspecified (HCC)       Relevant Medications   atorvastatin (LIPITOR) 80 MG tablet   dapagliflozin propanediol (FARXIGA) 10 MG TABS tablet   insulin aspart (NOVOLOG FLEXPEN) 100 UNIT/ML FlexPen   insulin glargine (LANTUS SOLOSTAR) 100 UNIT/ML Solostar Pen   metFORMIN (GLUCOPHAGE) 1000 MG tablet   nortriptyline (PAMELOR) 50 MG capsule   ramipril (ALTACE) 5 MG capsule       Meds ordered this encounter  Medications   atorvastatin (LIPITOR) 80 MG tablet    Sig: Take 1 tablet (80 mg total) by mouth daily.    Dispense:  90 tablet    Refill:  3   cetirizine (ZYRTEC) 10 MG tablet    Sig: Take 1 tablet (10 mg total) by mouth daily.    Dispense:  30 tablet    Refill:  11   DISCONTD: Continuous Blood Gluc Sensor (FREESTYLE LIBRE 14  DAY SENSOR) MISC    Sig: 1 Device by Does not apply route as directed.    Dispense:  2 each    Refill:  11   dapagliflozin propanediol (FARXIGA) 10 MG TABS tablet    Sig: Take 1 tablet (10 mg total) by mouth daily.    Dispense:  90 tablet    Refill:  3   insulin aspart (NOVOLOG FLEXPEN) 100 UNIT/ML FlexPen    Sig: Max daily 102 units    Dispense:  90 mL    Refill:  3   insulin glargine (LANTUS SOLOSTAR) 100 UNIT/ML Solostar Pen    Sig: Inject 40 Units into the skin daily.    Dispense:  45 mL    Refill:  3   metFORMIN (GLUCOPHAGE) 1000 MG tablet    Sig: Take 1 tablet (1,000 mg total) by mouth 2 (two) times daily with a meal.    Dispense:  180 tablet    Refill:  3   nitroGLYCERIN (NITRODUR - DOSED IN MG/24 HR) 0.4 mg/hr patch    Sig: Place 1 patch (0.4 mg total) onto the skin daily.    Dispense:  90 patch    Refill:  2   nitroGLYCERIN (NITROSTAT) 0.4 MG SL tablet    Sig: PLACE 1 TABLET UNDER THE TONGUE EVERY 5 MINUTES AS NEEDED FOR CHEST PAIN.    Dispense:  25 tablet    Refill:  3   nortriptyline (PAMELOR) 50 MG capsule    Sig: TAKE 1 CAPSULE BY MOUTH EVERYDAY AT BEDTIME    Dispense:  30 capsule    Refill:  0    Must have office visit for refills   pantoprazole (PROTONIX) 40 MG tablet    Sig: Take 1 tablet (40 mg total) by mouth daily.    Dispense:  90 tablet    Refill:  3   ramipril (ALTACE) 5 MG capsule    Sig: TAKE 2 CAPSULES (10 MG TOTAL) BY MOUTH DAILY.    Dispense:  180 capsule    Refill:  3   Continuous Blood Gluc Sensor (FREESTYLE LIBRE 14 DAY SENSOR) MISC    Sig: 1 Device  by Does not apply route as directed.    Dispense:  2 each    Refill:  11   hydrOXYzine (VISTARIL) 25 MG capsule    Sig: Take 1 capsule (25 mg total) by mouth every 8 (eight) hours as needed for itching.    Dispense:  60 capsule    Refill:  0    Follow-up: Return in about 6 months (around 02/20/2022).    Asencion Noble, MD

## 2021-08-23 NOTE — Assessment & Plan Note (Signed)
Referral for sleep study provided today, evaluate for OSA.

## 2021-08-23 NOTE — Assessment & Plan Note (Signed)
Compensated heart failure, no changes at this time.

## 2021-08-23 NOTE — Patient Instructions (Addendum)
Please take under advisement the changes in your diet we recommended and a referral to a dietitian will be made  Please follow-up with your appointments with your pain management doctor and your diabetes doctor along with the foot doctor and dentist  Refills on all medications sent to your pharmacy  An in lab sleep study will be ordered  Return to see Dr. Delford Field 6 months

## 2021-08-23 NOTE — Assessment & Plan Note (Signed)
Type 2 diabetes is poorly controlled at this time. An extended conversation was held with the patient to discuss the importance of three meals per day, In order to properly dose his short-acting insulin. Appropriate diet was discussed at length. Referral made to registered dietitian for assistance in formulating appropriate meal plan.

## 2021-08-25 ENCOUNTER — Other Ambulatory Visit: Payer: Self-pay | Admitting: Critical Care Medicine

## 2021-08-25 DIAGNOSIS — R0600 Dyspnea, unspecified: Secondary | ICD-10-CM

## 2021-08-25 NOTE — Telephone Encounter (Signed)
Requested Prescriptions  Pending Prescriptions Disp Refills   albuterol (VENTOLIN HFA) 108 (90 Base) MCG/ACT inhaler [Pharmacy Med Name: ALBUTEROL HFA (PROAIR) INHALER] 8.5 each 11    Sig: TAKE 2 PUFFS BY MOUTH EVERY 6 HOURS AS NEEDED FOR WHEEZE OR SHORTNESS OF BREATH     Pulmonology:  Beta Agonists Failed - 08/25/2021  1:28 AM      Failed - One inhaler should last at least one month. If the patient is requesting refills earlier, contact the patient to check for uncontrolled symptoms.      Passed - Valid encounter within last 12 months    Recent Outpatient Visits          2 days ago Type 2 diabetes mellitus with diabetic polyneuropathy, with long-term current use of insulin ([place])   [place] Elsie Stain, MD   9 months ago Type 2 diabetes mellitus with diabetic polyneuropathy, with long-term current use of insulin [facility])   [place], Patrick E, MD   10 months ago Type 2 diabetes mellitus with diabetic polyneuropathy, with long-term current use of insulin [facility])   [place], Patrick E, MD   1 year ago Type 2 diabetes mellitus with hyperglycemia, with long-term current use of insulin ([place])   [place] Fulp, [place], MD   2 years ago Left-sided chest wall pain   [place] [name], MD      Future Appointments            In 3 weeks Cleaver, Jossie Ng, NP [facility]   In 6 months Joya Gaskins Burnett Harry, MD [place]

## 2021-09-01 ENCOUNTER — Encounter: Payer: Self-pay | Admitting: Internal Medicine

## 2021-09-01 ENCOUNTER — Encounter: Payer: Self-pay | Admitting: Neurology

## 2021-09-02 ENCOUNTER — Ambulatory Visit (HOSPITAL_BASED_OUTPATIENT_CLINIC_OR_DEPARTMENT_OTHER): Payer: Medicare Other | Admitting: Family

## 2021-09-05 ENCOUNTER — Ambulatory Visit: Payer: Medicare Other | Admitting: Neurology

## 2021-09-09 IMAGING — MR MR HEAD W/O CM
8 series · 48 of 48 positions shown · non-contrast
Comparison: None.

CLINICAL DATA: Memory changes and slurred speech for 1.5 years

EXAM:
MRI HEAD WITHOUT CONTRAST
TECHNIQUE: Multiplanar, multiecho pulse sequences of the brain and surrounding
structures were obtained without intravenous contrast.

[Series 5: T1 · sagittal · 4.0mm · 0.75mm/px · 3 of 31 slices shown (1 of 2)]
[im 1/31]
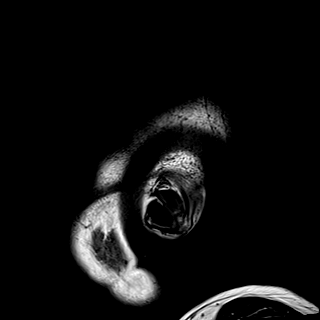
[im 16/31]
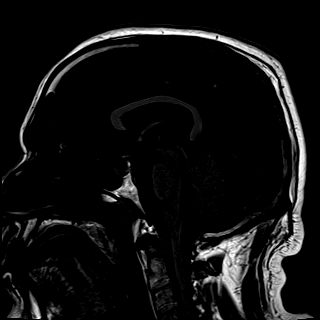
[im 31/31]
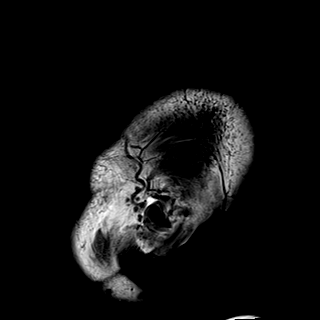

[Series 6: DWI · axial · 3.0mm · 1.44mm/px · z∈[-53,+86]mm · 9 of 84 slices shown (1 of 2)]
[im 1/84]
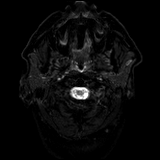
[im 11/84]
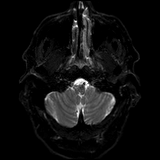
[im 21/84]
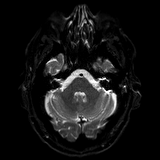
[im 32/84]
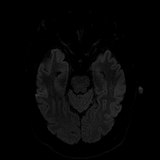
[im 42/84]
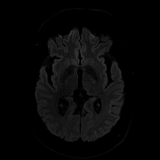
[im 52/84]
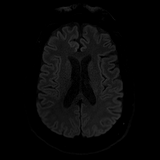
[im 63/84]
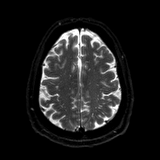
[im 73/84]
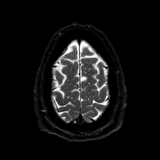
[im 84/84]
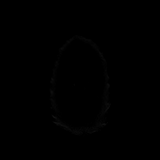

[Series 7: DWI · axial · 3.0mm · 1.44mm/px · z∈[-53,+86]mm · 4 of 42 slices shown (2 of 2)]
[im 1/42]
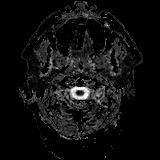
[im 14/42]
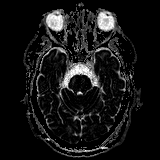
[im 28/42]
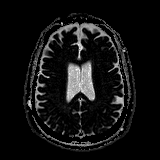
[im 42/42]
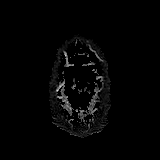

[Series 8: T2 · axial · 4.0mm · 0.36mm/px · z∈[-56,+87]mm · 3 of 28 slices shown (1 of 2)]
[im 1/28]
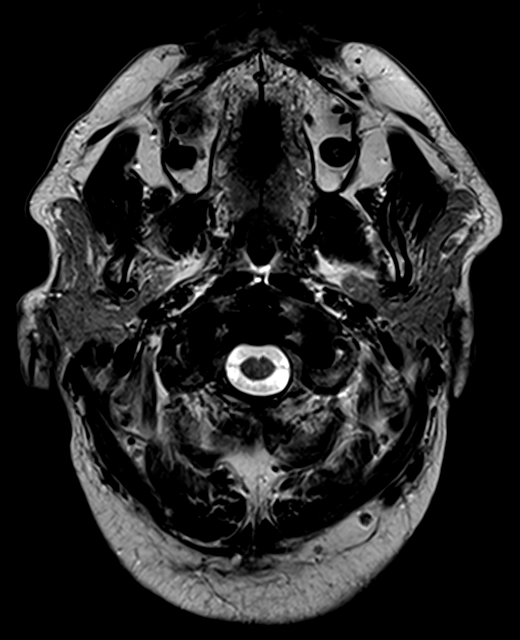
[im 14/28]
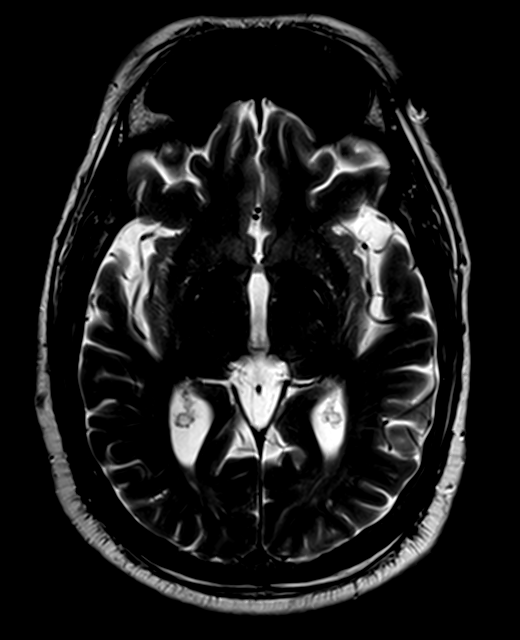
[im 28/28]
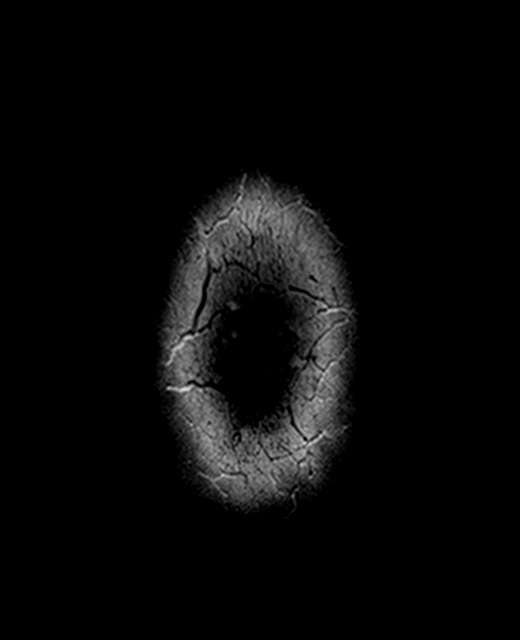

[Series 9: FLAIR · axial · 3.0mm · 0.72mm/px · z∈[-54,+87]mm · 3 of 26 slices shown]
[im 1/26]
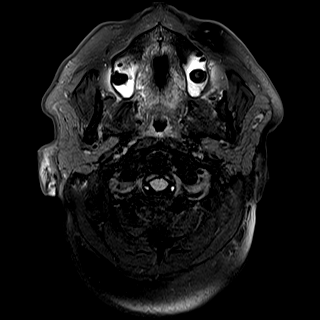
[im 13/26]
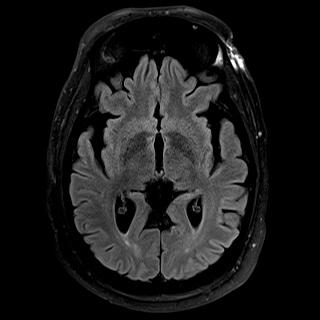
[im 26/26]
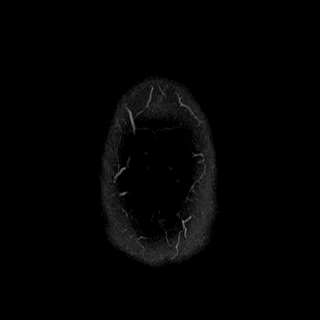

[Series 11: swi_images · axial · 2.2mm · 0.90mm/px · z∈[-58,+89]mm · 7 of 72 slices shown]
[im 1/72]
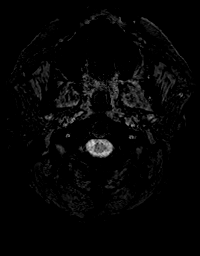
[im 12/72]
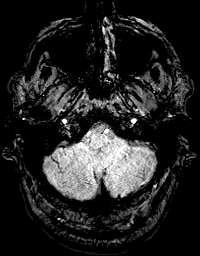
[im 24/72]
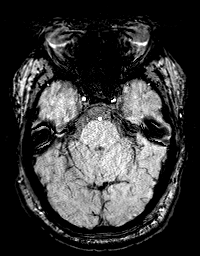
[im 36/72]
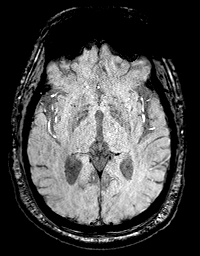
[im 48/72]
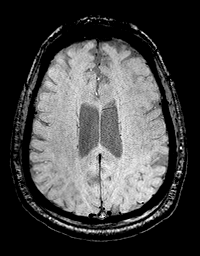
[im 60/72]
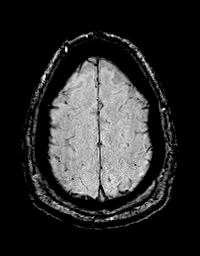
[im 72/72]
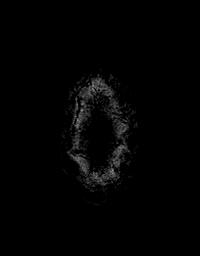

[Series 12: T1 · axial · 1.0mm · 0.94mm/px · z∈[-58,+92]mm · 16 of 160 slices shown (2 of 2)]
[im 1/160]
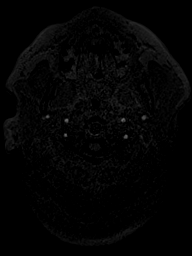
[im 11/160]
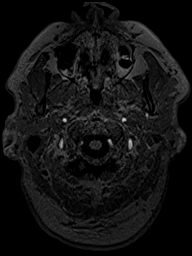
[im 22/160]
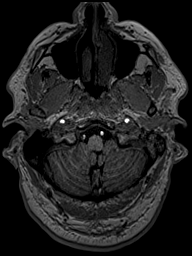
[im 32/160]
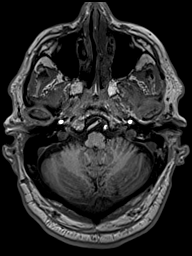
[im 43/160]
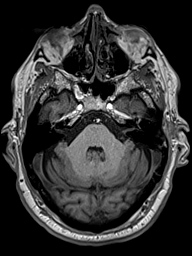
[im 54/160]
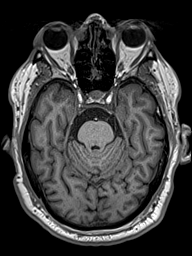
[im 64/160]
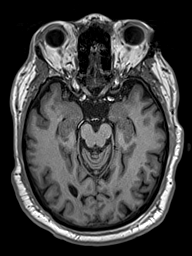
[im 75/160]
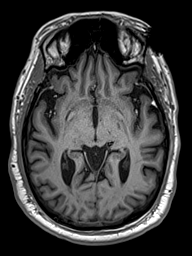
[im 85/160]
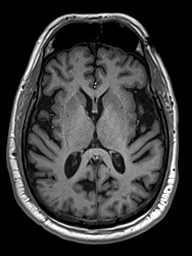
[im 96/160]
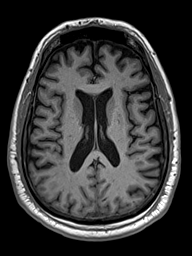
[im 107/160]
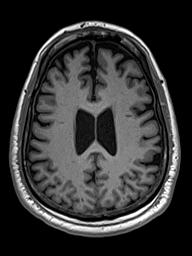
[im 117/160]
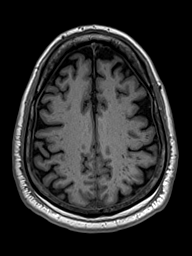
[im 128/160]
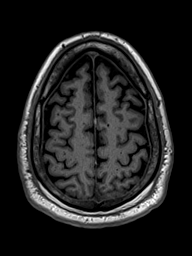
[im 138/160]
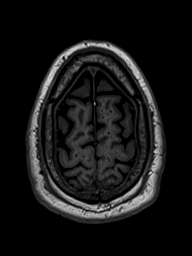
[im 149/160]
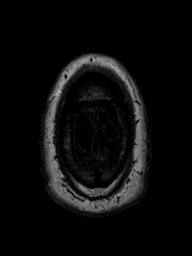
[im 160/160]
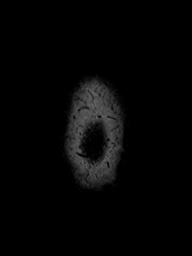

[Series 13: T2 · coronal · 4.5mm · 0.36mm/px · 3 of 30 slices shown (2 of 2)]
[im 1/30]
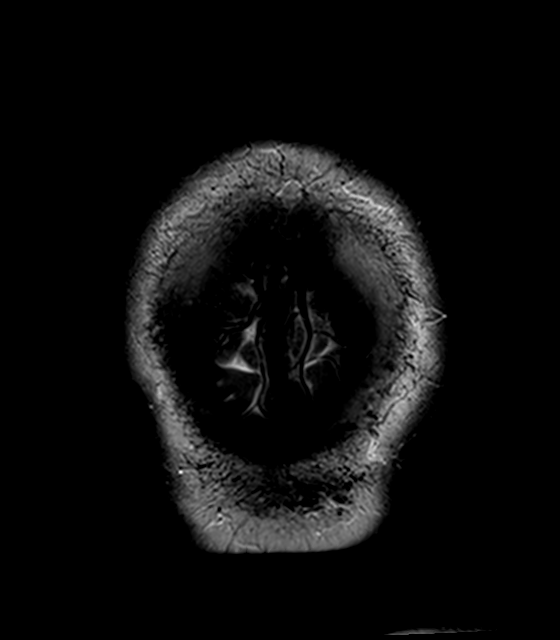
[im 15/30]
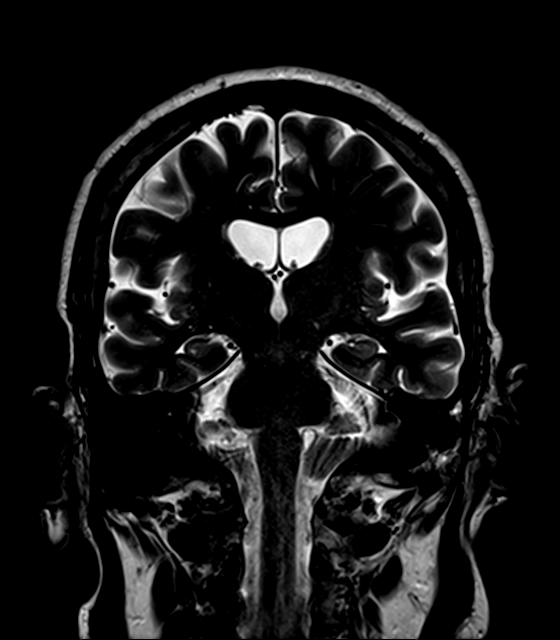
[im 30/30]
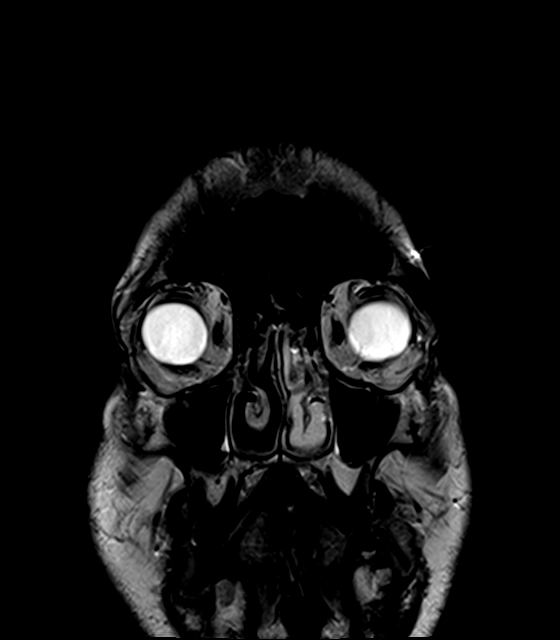

[48 of 48 positions shown; findings below may reference images not displayed]

FINDINGS: Brain: Normal brain volume. Rare remote shot there are less than 5
FLAIR hyperintensities in the cerebral white matter from remote
nonspecific insult. No hydrocephalus, collection, or mass effect. 4
mm nodule along the inner table of the left calvarium, likely dural
ossification given the T1 hyperintensity. The same appearance is
also seen along the falx.

Vascular: Normal flow voids

Skull and upper cervical spine: Is normal marrow signal

Sinuses/Orbits: Susceptibility artifact lateral to the left orbit.
No sinusitis or orbital findings.
IMPRESSION: No reversible finding or explanation for memory loss.

## 2021-09-15 ENCOUNTER — Other Ambulatory Visit: Payer: Self-pay | Admitting: Critical Care Medicine

## 2021-09-15 DIAGNOSIS — E1140 Type 2 diabetes mellitus with diabetic neuropathy, unspecified: Secondary | ICD-10-CM

## 2021-09-15 DIAGNOSIS — R1030 Lower abdominal pain, unspecified: Secondary | ICD-10-CM

## 2021-09-15 NOTE — Telephone Encounter (Signed)
Requested medication (s) are due for refill today: Due 10/03/21  Requested medication (s) are on the active medication list: Yes  Last refill:  08/23/21  Future visit scheduled: No  Notes to clinic:  Pharmacy asking for 90 day supply. Pt. Had visit on 08/23/21.    Requested Prescriptions  Pending Prescriptions Disp Refills   nortriptyline (PAMELOR) 50 MG capsule [Pharmacy Med Name: NORTRIPTYLINE HCL 50 MG CAP] 90 capsule 1    Sig: TAKE 1 CAPSULE BY MOUTH EVERYDAY AT BEDTIME     Psychiatry:  Antidepressants - Heterocyclics (TCAs) Passed - 09/15/2021 11:36 AM      Passed - Completed PHQ-2 or PHQ-9 in the last 360 days      Passed - Valid encounter within last 6 months    Recent Outpatient Visits           3 weeks ago Type 2 diabetes mellitus with diabetic polyneuropathy, with long-term current use of insulin (HCC)   [place] [facility] Storm Frisk, MD   10 months ago Type 2 diabetes mellitus with diabetic polyneuropathy, with long-term current use of insulin (HCC)   [place] [facility] Storm Frisk, MD   10 months ago Type 2 diabetes mellitus with diabetic polyneuropathy, with long-term current use of insulin [facility])   [place] [facility] Storm Frisk, MD   2 years ago Type 2 diabetes mellitus with hyperglycemia, with long-term current use of insulin (HCC)   [place] [facility] Fulp, [place], MD   2 years ago Left-sided chest wall pain    [facility] Cain Saupe, MD       Future Appointments             In 1 month Cleaver, Thomasene Ripple, NP [facility]   In 5 months Delford Field Charlcie Cradle, MD [facility]

## 2021-09-20 ENCOUNTER — Other Ambulatory Visit: Payer: Self-pay | Admitting: Gastroenterology

## 2021-09-20 ENCOUNTER — Encounter: Payer: Self-pay | Admitting: Critical Care Medicine

## 2021-09-20 ENCOUNTER — Ambulatory Visit (HOSPITAL_BASED_OUTPATIENT_CLINIC_OR_DEPARTMENT_OTHER): Payer: Medicare Other | Admitting: General Practice

## 2021-09-20 DIAGNOSIS — R1030 Lower abdominal pain, unspecified: Secondary | ICD-10-CM

## 2021-09-20 MED ORDER — DICYCLOMINE HCL 20 MG PO TABS: 20.0000 mg | ORAL_TABLET | Freq: Four times a day (QID) | ORAL | 2 refills | Status: DC | PRN

## 2021-10-02 ENCOUNTER — Other Ambulatory Visit: Payer: Self-pay

## 2021-10-02 ENCOUNTER — Ambulatory Visit (HOSPITAL_BASED_OUTPATIENT_CLINIC_OR_DEPARTMENT_OTHER): Payer: Medicare Other | Attending: Critical Care Medicine | Admitting: Internal Medicine

## 2021-10-02 DIAGNOSIS — G4710 Hypersomnia, unspecified: Secondary | ICD-10-CM | POA: Diagnosis present

## 2021-10-02 DIAGNOSIS — G4733 Obstructive sleep apnea (adult) (pediatric): Secondary | ICD-10-CM | POA: Insufficient documentation

## 2021-10-02 DIAGNOSIS — G4731 Primary central sleep apnea: Secondary | ICD-10-CM | POA: Insufficient documentation

## 2021-10-02 DIAGNOSIS — R0600 Dyspnea, unspecified: Secondary | ICD-10-CM | POA: Insufficient documentation

## 2021-10-05 MED ORDER — EPINEPHRINE 0.15 MG/0.3ML IJ SOAJ: 0.1500 mg | INTRAMUSCULAR | 2 refills | Status: AC | PRN

## 2021-10-05 NOTE — Addendum Note (Signed)
Addended by: Elsie Stain on: 10/05/2021 08:32 AM   Modules accepted: Orders

## 2021-10-10 ENCOUNTER — Ambulatory Visit: Payer: Medicare Other | Admitting: Registered"

## 2021-10-12 NOTE — Progress Notes (Signed)
Cardiology Office Note:    Date:  10/19/2021   ID:  Austin Vaughan, DOB [birth year], MRN [id number]  PCP:  Storm Frisk, MD   [facility] Providers Cardiologist:  Chilton Si, MD      Referring MD: Storm Frisk, MD   Follow-up for essential hypertension and coronary artery disease.  History of Present Illness:    Austin Vaughan is a 51 y.o. male with a hx of essential hypertension coronary artery disease, paroxysmal SVT, chronic combined systolic and diastolic CHF, OSA, asthma, GERD, type 2 diabetes, neuropathy, HLD, anxiety with depression, history of CVA, COVID, obesity, and nocturnal dyspnea.  He was treated for MI in 2013 while he lived in [place].  He presented with bilateral arm and chest discomfort.  He underwent PCI of unknown vessel and reported in LVEF of 47%.  He then moved to [place] and had a nuclear stress test 9/22.  This showed low risk and an EF of 57%.  He moved to [place] to be closer to his daughter-in-law who had a baby.  He was seen by Corine Shelter, PA-C 3/20 and reported palpitations.  He wore a 14-day cardiac event monitor which showed short runs of PSVT.  His metoprolol was increased to 50 mg.  He was seen by Bettina Gavia on 7/30.  At that time he reported pleuritic chest pain that was inconsistent with ACS.  He was seen at [facility] and it was confirmed that he did not have ALS.  He was suffering with severe neuropathic pain.  His palpitations did not improve with increasing metoprolol.  He wore an ambulatory monitor that showed rare PACs and PVCs with some short runs of SVT.  His metoprolol was increased to 75 mg daily.  He again did not think that the increased medication helped much.  His medication was increased to 200 mg.  He reported chest tightness and shortness of breath.  His echocardiogram 9/20 showed LVEF of 55-60% with G1 DD.  He was noted to have aortic valve sclerosis but no stenosis.  He had chronic limb ischemia and gangrene  of the right foot.  His ABIs confirmed 1/21.  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  He followed with the wound center and fortunately his wound healed completely with topical cream.  He was seen by Dr. Duke Salvia 06/21/2021.  During that time he had contacted the office with chest discomfort and reported taking nitroglycerin.  He indicated that he has been having chest pain and he had been using patches and medication to help manage his pain.  His chest pain was described as constant for 30 minutes.  He noticed the pain with sitting down watching TV.  He described the pain as sharp.  With pushing on his chest he noted the pain would stop.  He was taking over-the-counter medication for his acid reflux and reported eating spicy foods.  On 06/20/2021 he reported having a lot of palpitations that were happening at night before he would go to sleep.  He described the sensation as bubbles.  He said the palpitations would last for around an hour.  He reported getting out of breath while doing simple tasks like putting on his shoes and putting on close.  He did not routinely exercise due to ankle pain.  He also uses a cane to help with balance.  He denied exertional chest pain.  His blood pressure at home was well controlled.  He reported that  typically his diet included spicy foods.  He denied lightheadedness, syncope, orthopnea, PND, HA, lower extremity edema.  It was felt that his symptoms were related to GERD.  This follow-up nuclear stress test showed low risk and an EF of 57%.  He underwent sleep study with Dr. Delford FieldWright on 10/18/2021.  He presents to the clinic today for follow-up evaluation states he is feeling somewhat better since starting the Protonix.  He is noticing fewer episodes of chest discomfort.  He is waiting on results from recent sleep study.  He reports episodes of PND.  He has not had any episodes in the last 2 weeks.  We reviewed not taking sedatives prior to bed, avoiding  alcohol, losing weight, and avoiding back sleeping.  We reviewed his previous cardiac event monitor and echocardiogram.  He expressed understanding.  I will have him increase his physical activity as tolerated, continue his current medication regimen, and plan follow-up in 12 months.  Today he denies chest pain, shortness of breath, lower extremity edema, fatigue, palpitations, melena, hematuria, hemoptysis, diaphoresis, weakness, presyncope, syncope, orthopnea, and PND.  Past Medical History:  Diagnosis Date   Coronary artery disease    Diabetes mellitus without complication (HCC)    Diabetic foot ulcer (HCC) 08/23/2019   GERD (gastroesophageal reflux disease) 06/21/2021   Hypertension     Past Surgical History:  Procedure Laterality Date   CORONARY ANGIOPLASTY WITH STENT PLACEMENT  07/2002    Current Medications: Current Meds  Medication Sig   albuterol (VENTOLIN HFA) 108 (90 Base) MCG/ACT inhaler TAKE 2 PUFFS BY MOUTH EVERY 6 HOURS AS NEEDED FOR WHEEZE OR SHORTNESS OF BREATH   aspirin EC 81 MG tablet Take 81 mg by mouth daily.   atorvastatin (LIPITOR) 80 MG tablet Take 1 tablet (80 mg total) by mouth daily.   cetirizine (ZYRTEC) 10 MG tablet Take 1 tablet (10 mg total) by mouth daily.   Continuous Blood Gluc Receiver (FREESTYLE LIBRE 14 DAY READER) DEVI 1 Device by Does not apply route as directed.   Continuous Blood Gluc Sensor (FREESTYLE LIBRE 14 DAY SENSOR) MISC 1 Device by Does not apply route as directed.   dapagliflozin propanediol (FARXIGA) 10 MG TABS tablet Take 1 tablet (10 mg total) by mouth daily.   dicyclomine (BENTYL) 20 MG tablet Take 1 tablet (20 mg total) by mouth 4 (four) times daily as needed (abdominal cramping).   EPINEPHrine (EPIPEN JR) 0.15 MG/0.3ML injection Inject 0.15 mg into the muscle as needed for anaphylaxis.   gabapentin (NEURONTIN) 600 MG tablet Take 6 tablets by mouth at bedtime.   glucose blood test strip 4x daily   hydrOXYzine (VISTARIL) 25 MG capsule  Take 1 capsule (25 mg total) by mouth every 8 (eight) hours as needed for itching.   insulin aspart (NOVOLOG FLEXPEN) 100 UNIT/ML FlexPen Max daily 102 units   insulin glargine (LANTUS SOLOSTAR) 100 UNIT/ML Solostar Pen Inject 40 Units into the skin daily.   Insulin Pen Needle 30G X 5 MM MISC 1 Device by Does not apply route in the morning, at noon, in the evening, and at bedtime.   metFORMIN (GLUCOPHAGE) 1000 MG tablet Take 1 tablet (1,000 mg total) by mouth 2 (two) times daily with a meal.   metoprolol succinate (TOPROL-XL) 100 MG 24 hr tablet TAKE 1 TABLET BY MOUTH EVERY DAY   montelukast (SINGULAIR) 10 MG tablet TAKE 1 TABLET BY MOUTH EVERY DAY   nitroGLYCERIN (NITRODUR - DOSED IN MG/24 HR) 0.4 mg/hr patch Place 1 patch (0.4  mg total) onto the skin daily.   nitroGLYCERIN (NITROSTAT) 0.4 MG SL tablet PLACE 1 TABLET UNDER THE TONGUE EVERY 5 MINUTES AS NEEDED FOR CHEST PAIN.   nortriptyline (PAMELOR) 50 MG capsule TAKE 1 CAPSULE BY MOUTH EVERYDAY AT BEDTIME   pantoprazole (PROTONIX) 40 MG tablet Take 1 tablet (40 mg total) by mouth daily.   pregabalin (LYRICA) 300 MG capsule Take 300 mg by mouth 2 (two) times daily.   ramipril (ALTACE) 5 MG capsule TAKE 2 CAPSULES (10 MG TOTAL) BY MOUTH DAILY.   silver sulfADIAZINE (SILVADENE) 1 % cream Apply pea-sized amount to wound daily.     Allergies:   Ambien [zolpidem tartrate] and Shellfish allergy   Social History   Socioeconomic History   Marital status: Married    Spouse name: Not on file   Number of children: 2   Years of education: 12   Highest education level: Not on file  Occupational History   Occupation: disablitiy  Tobacco Use   Smoking status: Never   Smokeless tobacco: Never  Vaping Use   Vaping Use: Never used  Substance and Sexual Activity   Alcohol use: Not Currently   Drug use: Never   Sexual activity: Yes  Other Topics Concern   Not on file  Social History Narrative   Wife and patient relocated to [place] from  [place] a few months ago. - 06/11/18   He is on disability since 2019.     He lives with wife and daughter in a one-level apartment.    Right handed   Social Determinants of Health   Financial Resource Strain: Not on file  Food Insecurity: Not on file  Transportation Needs: Not on file  Physical Activity: Not on file  Stress: Not on file  Social Connections: Not on file     Family History: [name] family history includes ALS in his maternal aunt and mother; Heart disease in his paternal grandfather and paternal grandmother; Lung cancer in his father.  ROS:   Please see the history of present illness.     All other systems reviewed and are negative.   Risk Assessment/Calculations:           Physical Exam:    VS:  BP 137/87 (BP Location: Left Arm, Patient Position: Sitting, Cuff Size: Large)    Pulse 92    Ht 6\' 2"  (1.88 m)    Wt 264 lb 12.8 oz (120.1 kg)    SpO2 95%    BMI 34.00 kg/m     Wt Readings from Last 3 Encounters:  10/19/21 264 lb 12.8 oz (120.1 kg)  10/02/21 260 lb (117.9 kg)  08/23/21 264 lb (119.7 kg)     GEN:  Well nourished, well developed in no acute distress HEENT: Normal NECK: No JVD; No carotid bruits LYMPHATICS: No lymphadenopathy CARDIAC: RRR, no murmurs, rubs, gallops RESPIRATORY:  Clear to auscultation without rales, wheezing or rhonchi  ABDOMEN: Soft, non-tender, non-distended MUSCULOSKELETAL:  No edema; No deformity  SKIN: Warm and dry NEUROLOGIC:  Alert and oriented x 3 PSYCHIATRIC:  Normal affect    EKGs/Labs/Other Studies Reviewed:    The following studies were reviewed today:  05/05/2019 Echocardiogram IMPRESSIONS     1. Left ventricular ejection fraction, by visual estimation, is 55 to  60%. The left ventricle has normal function. Normal left ventricular size.  There is no left ventricular hypertrophy.   2. Left ventricular diastolic Doppler parameters are consistent with  impaired relaxation pattern of LV diastolic  filling.   3. The aortic valve is tricuspid Aortic valve regurgitation was not  visualized by color flow Doppler. Mild aortic valve sclerosis without  stenosis.   4. Global right ventricle has normal systolic function.The right  ventricular size is normal. No increase in right ventricular wall  thickness.   5. Right atrial size was normal.   6. Left atrial size was normal.   7. The inferior vena cava is normal in size with greater than 50%  respiratory variability, suggesting right atrial pressure of 3 mmHg.   8. The mitral valve is normal in structure. No evidence of mitral valve  regurgitation. No evidence of mitral stenosis.   9. The tricuspid valve is normal in structure. Tricuspid valve  regurgitation was not visualized by color flow Doppler.  10. TR signal is inadequate for assessing pulmonary artery systolic  pressure.   Nuclear stress test 07/01/2021    The study is normal. The study is low risk.   No ST deviation was noted.   Left ventricular function is normal. End diastolic cavity size is normal. End systolic cavity size is normal.   Prior study available for comparison from 04/19/2018.   Normal stress nuclear study with no ischemia or infarction.  Gated ejection fraction 57% with normal wall motion.  EKG: None today.  Recent Labs: 06/21/2021: ALT 23; BUN 6; Creatinine, Ser 0.70; Potassium 4.3; Sodium 142  Recent Lipid Panel    Component Value Date/Time   CHOL 114 06/21/2021 1445   TRIG 101 06/21/2021 1445   HDL 41 06/21/2021 1445   CHOLHDL 2.8 06/21/2021 1445   LDLCALC 54 06/21/2021 1445    ASSESSMENT & PLAN    Coronary artery disease-denies recent episodes of arm neck back or chest discomfort.  Underwent nuclear stress test 11/22.  This showed low risk and EF of 57.  Details above.  Underwent previous cardiac catheterization 9/19 which showed no ischemia and LV EF of 53%. Continue aspirin, atorvastatin, metoprolol Heart healthy low-sodium diet-salty 6  reviewed Increase physical activity as tolerated  GERD-continues to take protonix medication.  Reports only occasional episodes of discomfort since starting medication.   GERD diet instructions reviewed. Elevate head of bed Weight loss encouraged  Palpitations-cardiac event monitor previously showed rare PACs and PVCs with short runs of SVT. Continue metoprolol Increase physical activity as tolerated Heart healthy low-sodium diet Avoid triggers caffeine, chocolate, EtOH, dehydration etc.  Essential hypertension-BP today 137/87.  Well-controlled at home. Continue ramipril, metoprolol Heart healthy low-sodium diet Increase physical activity as tolerated  Chronic systolic and diastolic CHF-euvolemic today.  Weight stable.  No increased DOE or activity intolerance.  Echocardiogram 05/05/2019 showed LVEF of 50-60% with impaired diastolic relaxation and mild aortic valve sclerosis. Heart healthy low-sodium diet Daily weights-contact office with a weight increase of 2 pounds overnight or 5 pounds in 1 week Elevate lower extremities when not active  OSA-sleep evaluation 10/18/2021. Follows with Dr. Delford Field  Disposition: Follow-up with Dr. Duke Salvia or me in 9-12 months.       Medication Adjustments/Labs and Tests Ordered: Current medicines are reviewed at length with the patient today.  Concerns regarding medicines are outlined above.  No orders of the defined types were placed in this encounter.  No orders of the defined types were placed in this encounter.   There are no Patient Instructions on file for this visit.   Signed, Ronney Asters, NP  10/19/2021 4:10 PM      Notice: This dictation was prepared with Dragon dictation  along with smaller phrase technology. Any transcriptional errors that result from this process are unintentional and may not be corrected upon review.  I spent 13 minutes examining this patient, reviewing medications, and using patient centered shared decision  making involving her cardiac care.  Prior to her visit I spent greater than 20 minutes reviewing her past medical history,  medications, and prior cardiac tests.

## 2021-10-13 ENCOUNTER — Telehealth: Payer: Self-pay | Admitting: Critical Care Medicine

## 2021-10-13 NOTE — Telephone Encounter (Signed)
Called and left a vm at the sleep center  ?

## 2021-10-13 NOTE — Telephone Encounter (Signed)
Please call sleep center to see what status is of the sleep study reading , he had the study 10/02/21 ?

## 2021-10-18 ENCOUNTER — Encounter: Payer: Self-pay | Admitting: Critical Care Medicine

## 2021-10-19 ENCOUNTER — Encounter (HOSPITAL_BASED_OUTPATIENT_CLINIC_OR_DEPARTMENT_OTHER): Payer: Self-pay | Admitting: General Practice

## 2021-10-19 ENCOUNTER — Other Ambulatory Visit: Payer: Self-pay

## 2021-10-19 ENCOUNTER — Ambulatory Visit (INDEPENDENT_AMBULATORY_CARE_PROVIDER_SITE_OTHER): Payer: Medicare Other | Admitting: General Practice

## 2021-10-19 VITALS — BP 137/87 | HR 92 | Ht 74.0 in | Wt 264.8 lb

## 2021-10-19 DIAGNOSIS — R002 Palpitations: Secondary | ICD-10-CM | POA: Diagnosis not present

## 2021-10-19 DIAGNOSIS — I10 Essential (primary) hypertension: Secondary | ICD-10-CM | POA: Diagnosis not present

## 2021-10-19 DIAGNOSIS — I5042 Chronic combined systolic (congestive) and diastolic (congestive) heart failure: Secondary | ICD-10-CM

## 2021-10-19 DIAGNOSIS — K219 Gastro-esophageal reflux disease without esophagitis: Secondary | ICD-10-CM | POA: Diagnosis not present

## 2021-10-19 DIAGNOSIS — I2510 Atherosclerotic heart disease of native coronary artery without angina pectoris: Secondary | ICD-10-CM

## 2021-10-19 DIAGNOSIS — G4733 Obstructive sleep apnea (adult) (pediatric): Secondary | ICD-10-CM

## 2021-10-19 DIAGNOSIS — Z9861 Coronary angioplasty status: Secondary | ICD-10-CM

## 2021-10-19 NOTE — Patient Instructions (Signed)
Medication Instructions:  ?Your Physician recommend you continue on your current medication as directed.   ? ?*If you need a refill on your cardiac medications before your next appointment, please call your pharmacy* ? ?Follow-Up: ?At [facility], you and your health needs are our priority.  As part of our continuing mission to provide you with exceptional heart care, we have created designated Provider Care Teams.  These Care Teams include your primary Cardiologist (physician) and Advanced Practice Providers (APPs -  Physician Assistants and Nurse Practitioners) who all work together to provide you with the care you need, when you need it. ? ?We recommend signing up for the patient portal called "MyChart".  Sign up information is provided on this After Visit Summary.  MyChart is used to connect with patients for Virtual Visits (Telemedicine).  Patients are able to view lab/test results, encounter notes, upcoming appointments, etc.  Non-urgent messages can be sent to your provider as well.   ?To learn more about what you can do with MyChart, go to [email or web address].   ? ?Your next appointment:   ?1 year(s) ? ?The format for your next appointment:   ?In Person ? ?Provider:   ?Chilton Si, MD{ ? ?Other Instructions ?Exercise recommendations: ?The American Heart Association recommends 150 minutes of moderate intensity exercise weekly. ?Try 30 minutes of moderate intensity exercise 4-5 times per week. ?This could include walking, jogging, or swimming. ? ? ?

## 2021-10-23 ENCOUNTER — Encounter: Payer: Self-pay | Admitting: Critical Care Medicine

## 2021-10-23 DIAGNOSIS — G4710 Hypersomnia, unspecified: Secondary | ICD-10-CM | POA: Diagnosis not present

## 2021-10-23 DIAGNOSIS — G4733 Obstructive sleep apnea (adult) (pediatric): Secondary | ICD-10-CM

## 2021-10-23 DIAGNOSIS — G2581 Restless legs syndrome: Secondary | ICD-10-CM

## 2021-10-23 NOTE — Procedures (Signed)
° ° °  Patient Name: Austin Vaughan, Austin Vaughan Date: 10/02/2021 Gender: Male D.O.B: 1970/09/19 Age (years): 50 Referring Provider: Shan Levans Height (inches): 74 Interpreting Physician: Jetty Duhamel MD, ABSM Weight (lbs): 260 RPSGT: [employer] BMI: 33 MRN: [id number] Neck Size: 17.00  CLINICAL INFORMATION Sleep Study Type: NPSG Indication for sleep study: Snoring Epworth Sleepiness Score: 13  SLEEP STUDY TECHNIQUE As per the AASM Manual for the Scoring of Sleep and Associated Events v2.3 (April 2016) with a hypopnea requiring 4% desaturations.  The channels recorded and monitored were frontal, central and occipital EEG, electrooculogram (EOG), submentalis EMG (chin), nasal and oral airflow, thoracic and abdominal wall motion, anterior tibialis EMG, snore microphone, electrocardiogram, and pulse oximetry.  MEDICATIONS Medications self-administered by patient taken the night of the study : none reported  SLEEP ARCHITECTURE The study was initiated at 10:01:03 PM and ended at 4:45:33 AM.  Sleep onset time was 64.4 minutes and the sleep efficiency was 69.9%%. The total sleep time was 282.6 minutes.  Stage REM latency was 75.5 minutes.  The patient spent 22.3%% of the night in stage N1 sleep, 61.6%% in stage N2 sleep, 3.0%% in stage N3 and 13.1% in REM.  Alpha intrusion was absent.  Supine sleep was 26.55%.  RESPIRATORY PARAMETERS The overall apnea/hypopnea index (AHI) was 34.2 per hour. There were 76 total apneas, including 49 obstructive, 26 central and 1 mixed apneas. There were 85 hypopneas and 56 RERAs.  The AHI during Stage REM sleep was 51.9 per hour.  AHI while supine was 23.2 per hour.  The mean oxygen saturation was 91.7%. The minimum SpO2 during sleep was 76.0%.  moderate snoring was noted during this study.  CARDIAC DATA The 2 lead EKG demonstrated sinus rhythm. The mean heart rate was 73.0 beats per minute. Other EKG findings include: None.  LEG MOVEMENT  DATA The total PLMS were 0 with a resulting PLMS index of 0.0. Associated arousal with leg movement index was 5.9 .  IMPRESSIONS - Moderate obstructive sleep apnea occurred during this study (AHI = 34.2/h). - Mild central sleep apnea occurred during this study (CAI = 5.5/h). - Moderate oxygen desaturation was noted during this study (Min O2 = 76.0%). Mean 92%. - The patient snored with moderate snoring volume. - No cardiac abnormalities were noted during this study. - Limb Movements total 102 (21.7/ hr). Limb movements with arousal or awakening total 28(5.9/ hr).  DIAGNOSIS - Obstructive Sleep Apnea (G47.33) - Central Sleep Apnea (G47.37)  RECOMMENDATIONS - Suggest CPAP titration sleep study or autopap. Other options would be based on clinical judgment. - Consider trial of Mirapex, Requip, or Sinemet for treatment of Periodic Leg Movements of Sleep if this remains clinically significant after treating OSA. - Be careful with alcohol, sedatives and other CNS depressants that may worsen sleep apnea and disrupt normal sleep architecture. - Sleep hygiene should be reviewed to assess factors that may improve sleep quality. - Weight management and regular exercise should be initiated or continued if appropriate.  [Electronically signed] 10/23/2021 11:52 AM  Jetty Duhamel MD, ABSM Diplomate, American Board of Sleep Medicine   NPI: [id number]                         Jetty Duhamel Diplomate, American Board of Sleep Medicine  ELECTRONICALLY SIGNED ON:  10/23/2021, 11:55 AM [facility] PH: [phone]   FX: [phone] ACCREDITED BY THE AMERICAN ACADEMY OF SLEEP MEDICINE

## 2021-10-24 ENCOUNTER — Encounter: Payer: Self-pay | Admitting: Critical Care Medicine

## 2021-10-24 DIAGNOSIS — G2581 Restless legs syndrome: Secondary | ICD-10-CM | POA: Insufficient documentation

## 2021-10-24 MED ORDER — ROPINIROLE HCL 4 MG PO TABS: 4.0000 mg | ORAL_TABLET | Freq: Every day | ORAL | 1 refills | Status: DC

## 2021-10-24 NOTE — Telephone Encounter (Signed)
I called the patient , he has moderate sleep apnea and restless legs ? ?Plan: mirapex low dose qhs ? ?Plan  Autopap to start 5-15cmh20 mask per sleep study ? ?Plan cpap titration in lab at later date ? ? ?

## 2021-10-25 ENCOUNTER — Telehealth: Payer: Self-pay

## 2021-10-25 NOTE — Telephone Encounter (Signed)
After speaking with patient, order faxed to [facility] ?

## 2021-10-25 NOTE — Telephone Encounter (Signed)
Call placed to patient to inquire if he has a preferred DME company to provide CPAP and he had no preference.  ? ?Order then faxed to [facility]- patient informed  ?

## 2021-11-02 ENCOUNTER — Encounter: Payer: Self-pay | Admitting: Critical Care Medicine

## 2021-11-02 MED ORDER — NITROGLYCERIN 0.4 MG/HR TD PT24: 0.4000 mg | MEDICATED_PATCH | Freq: Every day | TRANSDERMAL | 2 refills | Status: DC

## 2021-11-07 ENCOUNTER — Ambulatory Visit: Payer: Medicare Other | Admitting: Neurology

## 2021-11-08 ENCOUNTER — Encounter: Payer: Self-pay | Admitting: Critical Care Medicine

## 2021-11-08 DIAGNOSIS — G4733 Obstructive sleep apnea (adult) (pediatric): Secondary | ICD-10-CM

## 2021-11-21 ENCOUNTER — Encounter (HOSPITAL_BASED_OUTPATIENT_CLINIC_OR_DEPARTMENT_OTHER): Payer: Self-pay | Admitting: Internal Medicine

## 2021-11-21 ENCOUNTER — Other Ambulatory Visit: Payer: Self-pay | Admitting: Critical Care Medicine

## 2021-11-22 NOTE — Telephone Encounter (Signed)
Pt has refill remaining. Please fill. ?Requested Prescriptions  ?Pending Prescriptions Disp Refills  ?? rOPINIRole (REQUIP) 4 MG tablet [Pharmacy Med Name: ROPINIROLE HCL 4 MG TABLET] 30 tablet 1  ?  Sig: TAKE 1 TABLET BY MOUTH AT BEDTIME.  ?  ? Neurology:  Parkinsonian Agents Passed - 11/21/2021 10:31 AM  ?  ?  Passed - Last BP in normal range  ?  BP Readings from Last 1 Encounters:  ?10/19/21 137/87  ?   ?  ?  Passed - Last Heart Rate in normal range  ?  Pulse Readings from Last 1 Encounters:  ?10/19/21 92  ?   ?  ?  Passed - Valid encounter within last 12 months  ?  Recent Outpatient Visits   ?      ? 3 months ago Type 2 diabetes mellitus with diabetic polyneuropathy, with long-term current use of insulin (HCC)  ? [facility] Storm Frisk, MD  ? 1 year ago Type 2 diabetes mellitus with diabetic polyneuropathy, with long-term current use of insulin (HCC)  ? [facility] Storm Frisk, MD  ? 1 year ago Type 2 diabetes mellitus with diabetic polyneuropathy, with long-term current use of insulin (HCC)  ? [facility] Storm Frisk, MD  ? 1 year ago Mild persistent asthma, unspecified whether complicated  ? Primary Care at [facility] [place], PA-C  ? 1 year ago Acute pain of left shoulder  ? Primary Care at [facility], [place], PA-C  ?  ?  ?Future Appointments   ?        ? In 3 months Delford Field Charlcie Cradle, MD [facility]  ?  ? ?  ?  ?  ? ? ?

## 2021-11-27 ENCOUNTER — Encounter (HOSPITAL_BASED_OUTPATIENT_CLINIC_OR_DEPARTMENT_OTHER): Payer: Self-pay | Admitting: Cardiovascular Disease

## 2021-11-29 NOTE — Telephone Encounter (Signed)
Luke see pt message.  I told him to reduce lantus to 35 units , and hold novolog if sugar < 150 before meal,  please get him in with you asap to review  ?

## 2021-11-30 ENCOUNTER — Telehealth: Payer: Self-pay | Admitting: Pharmacist

## 2021-11-30 DIAGNOSIS — Z794 Long term (current) use of insulin: Secondary | ICD-10-CM

## 2021-11-30 MED ORDER — LANTUS SOLOSTAR 100 UNIT/ML ~~LOC~~ SOPN: 34.0000 [IU] | PEN_INJECTOR | Freq: Every day | SUBCUTANEOUS | 3 refills | Status: DC

## 2021-11-30 NOTE — Telephone Encounter (Signed)
Call placed to patient. Introduced myself and the reason for my call. Patient has been experiencing early morning, fasting hypoglycemia. He was instructed by his PCP to decrease his Lantus dose earlier but he continues to take 40 units daily. Reports fasting CBGs 53 - 99 over the last week. He does not take his Novolog if he is not eating and also if his pre-prandial glucose is <150. Of note, he reports a decreased appetite and sometimes eats only 1 meal daily.  ? ?I instructed him to decrease his Lantus to 34 units daily. He will message or call us over the next two days to let us know if hypoglycemia improves. He verbalizes understanding of the plan. Will route to PCP. I have updated his medication in his chart.  ?

## 2021-11-30 NOTE — Telephone Encounter (Signed)
Austin Vaughan I am requesting can you contact this patient?   He said no one has called him as of yet ?

## 2021-12-01 ENCOUNTER — Telehealth: Payer: Self-pay

## 2021-12-01 NOTE — Telephone Encounter (Signed)
Called [facility], spoke to Karen/ [facility] who confirmed that th patient was set  up with his CPAP machine 11/17/2021.  ?

## 2021-12-02 ENCOUNTER — Ambulatory Visit: Payer: Medicare Other | Attending: Critical Care Medicine

## 2021-12-02 DIAGNOSIS — Z0000 Encounter for general adult medical examination without abnormal findings: Secondary | ICD-10-CM

## 2021-12-02 NOTE — Progress Notes (Signed)
? ?Subjective:  ? Austin Vaughan is a 51 y.o. male who presents for an Initial Medicare Annual Wellness Visit. ? ?Patient visit virtually in the context of Covid-19 pandemic. ? ? I connected with Austin Vaughan on 12/02/21 at 9:03am by telephone and verified that I am speaking with the correct person using two identifiers. ?I discussed the limitations, risks, security and privacy concerns of performing an evaluation and management service by telephone and the availability of in person appointments. I also discussed with the patient that there may be a patient responsible charge related to this service. The patient expressed understanding and agreed to proceed. ?Patient location:  Home ?My Location:  Office ?Persons on the telephone call:  Patient and myself  ? ?Review of Systems    ?  ? ?   ?Objective:  ?  ?There were no vitals filed for this visit. ?There is no height or weight on file to calculate BMI. ? ? ?  12/02/2021  ?  9:04 AM 10/02/2021  ?  8:28 PM 10/31/2019  ? 10:28 AM 08/24/2019  ? 12:36 AM 06/11/2019  ? 10:12 PM 04/29/2019  ?  2:03 PM 04/29/2019  ?  1:44 PM  ?Advanced Directives  ?Does Patient Have a Medical Advance Directive? No No No No No No No  ?Would patient like information on creating a medical advance directive? Yes (Inpatient - patient defers creating a medical advance directive at this time - Information given) No - Patient declined  No - Patient declined     ? ? ?Current Medications (verified) ?Outpatient Encounter Medications as of 12/02/2021  ?Medication Sig  ? albuterol (VENTOLIN HFA) 108 (90 Base) MCG/ACT inhaler TAKE 2 PUFFS BY MOUTH EVERY 6 HOURS AS NEEDED FOR WHEEZE OR SHORTNESS OF BREATH  ? aspirin EC 81 MG tablet Take 81 mg by mouth daily.  ? atorvastatin (LIPITOR) 80 MG tablet Take 1 tablet (80 mg total) by mouth daily.  ? cetirizine (ZYRTEC) 10 MG tablet Take 1 tablet (10 mg total) by mouth daily.  ? Continuous Blood Gluc Receiver (FREESTYLE LIBRE 14 DAY READER) DEVI 1 Device by Does not apply  route as directed.  ? Continuous Blood Gluc Sensor (FREESTYLE LIBRE 14 DAY SENSOR) MISC 1 Device by Does not apply route as directed.  ? dapagliflozin propanediol (FARXIGA) 10 MG TABS tablet Take 1 tablet (10 mg total) by mouth daily.  ? dicyclomine (BENTYL) 20 MG tablet Take 1 tablet (20 mg total) by mouth 4 (four) times daily as needed (abdominal cramping).  ? EPINEPHrine (EPIPEN JR) 0.15 MG/0.3ML injection Inject 0.15 mg into the muscle as needed for anaphylaxis.  ? gabapentin (NEURONTIN) 600 MG tablet Take 6 tablets by mouth at bedtime.  ? glucose blood test strip 4x daily  ? hydrOXYzine (VISTARIL) 25 MG capsule Take 1 capsule (25 mg total) by mouth every 8 (eight) hours as needed for itching.  ? insulin aspart (NOVOLOG FLEXPEN) 100 UNIT/ML FlexPen Max daily 102 units  ? insulin glargine (LANTUS SOLOSTAR) 100 UNIT/ML Solostar Pen Inject 34 Units into the skin daily.  ? Insulin Pen Needle 30G X 5 MM MISC 1 Device by Does not apply route in the morning, at noon, in the evening, and at bedtime.  ? metFORMIN (GLUCOPHAGE) 1000 MG tablet Take 1 tablet (1,000 mg total) by mouth 2 (two) times daily with a meal.  ? metoprolol succinate (TOPROL-XL) 100 MG 24 hr tablet TAKE 1 TABLET BY MOUTH EVERY DAY  ? montelukast (SINGULAIR) 10 MG tablet TAKE  1 TABLET BY MOUTH EVERY DAY  ? nitroGLYCERIN (NITRODUR - DOSED IN MG/24 HR) 0.4 mg/hr patch Place 1 patch (0.4 mg total) onto the skin daily.  ? nitroGLYCERIN (NITROSTAT) 0.4 MG SL tablet PLACE 1 TABLET UNDER THE TONGUE EVERY 5 MINUTES AS NEEDED FOR CHEST PAIN.  ? nortriptyline (PAMELOR) 50 MG capsule TAKE 1 CAPSULE BY MOUTH EVERYDAY AT BEDTIME  ? pantoprazole (PROTONIX) 40 MG tablet Take 1 tablet (40 mg total) by mouth daily.  ? pregabalin (LYRICA) 300 MG capsule Take 300 mg by mouth 2 (two) times daily.  ? ramipril (ALTACE) 5 MG capsule TAKE 2 CAPSULES (10 MG TOTAL) BY MOUTH DAILY.  ? rOPINIRole (REQUIP) 4 MG tablet Take 1 tablet (4 mg total) by mouth at bedtime.  ? silver  sulfADIAZINE (SILVADENE) 1 % cream Apply pea-sized amount to wound daily.  ? ?No facility-administered encounter medications on file as of 12/02/2021.  ? ? ?Allergies (verified) ?Ambien [zolpidem tartrate] and Shellfish allergy  ? ?History: ?Past Medical History:  ?Diagnosis Date  ? Coronary artery disease   ? Diabetes mellitus without complication (HCC)   ? Diabetic foot ulcer (HCC) 08/23/2019  ? GERD (gastroesophageal reflux disease) 06/21/2021  ? Hypertension   ? ?Past Surgical History:  ?Procedure Laterality Date  ? CORONARY ANGIOPLASTY WITH STENT PLACEMENT  07/2002  ? ?Family History  ?Problem Relation Age of Onset  ? ALS Mother   ? Lung cancer Father   ? ALS Maternal Aunt   ? Heart disease Paternal Grandmother   ? Heart disease Paternal Grandfather   ? ?Social History  ? ?Socioeconomic History  ? Marital status: Married  ?  Spouse name: Not on file  ? Number of children: 2  ? Years of education: 23  ? Highest education level: Not on file  ?Occupational History  ? Occupation: disablitiy  ?Tobacco Use  ? Smoking status: Never  ? Smokeless tobacco: Never  ?Vaping Use  ? Vaping Use: Never used  ?Substance and Sexual Activity  ? Alcohol use: Not Currently  ? Drug use: Never  ? Sexual activity: Yes  ?Other Topics Concern  ? Not on file  ?Social History Narrative  ? Wife and patient relocated to [place] from [place] a few months ago. - 06/11/18  ? He is on disability since 2019.    ? He lives with wife and daughter in a one-level apartment.   ? Right handed  ? ?Social Determinants of Health  ? ?Financial Resource Strain: Not on file  ?Food Insecurity: Not on file  ?Transportation Needs: Not on file  ?Physical Activity: Not on file  ?Stress: Not on file  ?Social Connections: Not on file  ? ? ?Tobacco Counseling ?Counseling given: Not Answered ? ? ?Clinical Intake: ? ?  ? ?Pain : No/denies pain ? ?  ? ?Diabetes: Yes ?CBG done?: No ? ?How often do you need to have someone help you when you read instructions, pamphlets,  or other written materials from your doctor or pharmacy?: (P) 3 - Sometimes ? ?Diabetic Yes  ? ?  ? ?  ? ? ?Activities of Daily Living ? ?  12/02/2021  ?  9:05 AM 11/30/2021  ?  1:02 AM  ?In your present state of health, do you have any difficulty performing the following activities:  ?Hearing? 0 0  ?Vision? 0 1  ?Difficulty concentrating or making decisions? 0 1  ?Walking or climbing stairs? 1 1  ?Dressing or bathing? 0 1  ?Doing errands, shopping? 0 1  ?  Preparing Food and eating ? N Y  ?Using the Toilet? N N  ?In the past six months, have you accidently leaked urine? N Y  ?Do you have problems with loss of bowel control? N N  ?Managing your Medications? N Y  ?Managing your Finances? N Y  ?Housekeeping or managing your Housekeeping? N Y  ? ? ?Patient Care Team: ?Storm FriskWright, Patrick E, MD as PCP - General (Pulmonary Disease) ?Chilton Siandolph, Tiffany, MD as PCP - Cardiology (Cardiology) ?[place]endale ChardPatel, Donika K, DO as Consulting Physician (Neurology) ? ?Indicate any recent Medical Services you may have received from other than [facility] providers in the past year (date may be approximate). ? ?   ?Assessment:  ? This is a routine wellness examination for Austin Vaughan. ? ?Hearing/Vision screen ?No results found. ? ?Dietary issues and exercise activities discussed: ?  ? ? Goals Addressed   ?None ?  ?Depression Screen ? ?  12/02/2021  ?  9:04 AM 11/03/2020  ?  9:41 AM 09/23/2020  ?  9:17 AM 03/27/2019  ?  4:00 PM 10/31/2018  ?  3:03 PM 10/08/2018  ?  1:16 PM 09/19/2018  ?  4:21 PM  ?PHQ 2/9 Scores  ?PHQ - 2 Score 0 3 4 4 6 2 6   ?PHQ- 9 Score  14 17 19 25  24   ?  ?Fall Risk ? ?  12/02/2021  ?  9:04 AM 11/30/2021  ?  1:02 AM 08/23/2021  ? 11:21 AM 11/03/2020  ?  9:40 AM 09/23/2020  ?  9:17 AM  ?Fall Risk   ?Falls in the past year? 0 1 0 0 1  ?Number falls in past yr: 0 1 0 0 0  ?Injury with Fall? 0 1 0 0 0  ?Risk for fall due to :   Other (Comment)  History of fall(s)  ?Follow up     Falls evaluation completed  ? ? ?FALL RISK PREVENTION PERTAINING TO THE HOME: ? ?Any  stairs in or around the home? Yes  ?If so, are there any without handrails? No  ?Home free of loose throw rugs in walkways, pet beds, electrical cords, etc? Yes  ?Adequate lighting in your home to reduce ris

## 2021-12-07 ENCOUNTER — Encounter: Payer: Self-pay | Admitting: Critical Care Medicine

## 2021-12-11 ENCOUNTER — Ambulatory Visit (HOSPITAL_BASED_OUTPATIENT_CLINIC_OR_DEPARTMENT_OTHER): Payer: Medicare Other | Admitting: Internal Medicine

## 2021-12-13 ENCOUNTER — Ambulatory Visit (HOSPITAL_BASED_OUTPATIENT_CLINIC_OR_DEPARTMENT_OTHER): Payer: Medicare Other | Attending: Critical Care Medicine | Admitting: Internal Medicine

## 2021-12-13 VITALS — Ht 74.0 in | Wt 260.0 lb

## 2021-12-13 DIAGNOSIS — E119 Type 2 diabetes mellitus without complications: Secondary | ICD-10-CM | POA: Insufficient documentation

## 2021-12-13 DIAGNOSIS — I10 Essential (primary) hypertension: Secondary | ICD-10-CM | POA: Diagnosis not present

## 2021-12-13 DIAGNOSIS — R0683 Snoring: Secondary | ICD-10-CM | POA: Insufficient documentation

## 2021-12-13 DIAGNOSIS — G4733 Obstructive sleep apnea (adult) (pediatric): Secondary | ICD-10-CM | POA: Diagnosis not present

## 2021-12-18 ENCOUNTER — Other Ambulatory Visit: Payer: Self-pay | Admitting: Critical Care Medicine

## 2021-12-18 ENCOUNTER — Encounter: Payer: Self-pay | Admitting: Critical Care Medicine

## 2021-12-18 DIAGNOSIS — G4733 Obstructive sleep apnea (adult) (pediatric): Secondary | ICD-10-CM | POA: Diagnosis not present

## 2021-12-18 DIAGNOSIS — R1030 Lower abdominal pain, unspecified: Secondary | ICD-10-CM

## 2021-12-18 NOTE — Procedures (Signed)
? ? ? ?  Patient Name: Austin, Vaughan ?Study Date: 12/13/2021 ?Gender: Male ?D.O.B: 11/14/1970 ?Age (years): 22 ?Referring Provider: Asencion Noble ?Height (inches): 74 ?Interpreting Physician: Baird Lyons MD, ABSM ?Weight (lbs): 260 ?RPSGT: Steffey, Lennette Bihari ?BMI: 33 ?MRN: [id number] ?Neck Size: 17.00 ? ?CLINICAL INFORMATION ?The patient is referred for a CPAP titration to treat sleep apnea. ? ?Date of NPSG, Split Night or HST: 10/02/21  AHI 34.2/ hr, desaturation to 76%, body weight 260 lbs ? ?SLEEP STUDY TECHNIQUE ?As per the AASM Manual for the Scoring of Sleep and Associated Events v2.3 (April 2016) with a hypopnea requiring 4% desaturations. ? ?The channels recorded and monitored were frontal, central and occipital EEG, electrooculogram (EOG), submentalis EMG (chin), nasal and oral airflow, thoracic and abdominal wall motion, anterior tibialis EMG, snore microphone, electrocardiogram, and pulse oximetry. Continuous positive airway pressure (CPAP) was initiated at the beginning of the study and titrated to treat sleep-disordered breathing. ? ?MEDICATIONS ?Medications self-administered by patient taken the night of the study : none reported ? ?TECHNICIAN COMMENTS ?Comments added by technician: None ?Comments added by scorer: N/A ? ?RESPIRATORY PARAMETERS ?Optimal PAP Pressure (cm): 15 AHI at Optimal Pressure (/hr): 0 ?Overall Minimal O2 (%): 85.0 Supine % at Optimal Pressure (%): 100 ?Minimal O2 at Optimal Pressure (%): 89.0  ? ?SLEEP ARCHITECTURE ?The study was initiated at 10:08:33 PM and ended at 4:50:29 AM. ? ?Sleep onset time was 31.3 minutes and the sleep efficiency was 84.0%%. The total sleep time was 337.7 minutes. ? ?The patient spent 3.6%% of the night in stage N1 sleep, 87.1%% in stage N2 sleep, 0.0%% in stage N3 and 9.3% in REM.Stage REM latency was 166.5 minutes ? ?Wake after sleep onset was 33.0. Alpha intrusion was absent. Supine sleep was 34.27%. ? ?CARDIAC DATA ?The 2 lead EKG demonstrated sinus  rhythm. The mean heart rate was 83.1 beats per minute. Other EKG findings include: None. ? ?LEG MOVEMENT DATA ?The total Periodic Limb Movements of Sleep (PLMS) were 0. The PLMS index was 0.0. A PLMS index of <15 is considered normal in adults. ? ?IMPRESSIONS ?- The optimal PAP pressure was 15 cm of water. ?- Moderate oxygen desaturations were observed during this titration (min O2 = 85.0%). Mean O2 saturation on CPAP 15 was 94.2%. ?- The patient snored with soft snoring volume during this titration study. ?- No cardiac abnormalities were observed during this study. ?- Clinically significant periodic limb movements were not noted during this study. Arousals associated with PLMs were rare. ? ?DIAGNOSIS ?- Obstructive Sleep Apnea (G47.33) ? ?RECOMMENDATIONS ?- Trial of CPAP therapy on 15 cm H2O or autopap 10-20. ?- Patinet used a Medium size Fisher[employer]Paykel Full Face Simplus mask and heated humidification. ?- Be careful with alcohol, sedatives and other CNS depressants that may worsen sleep apnea and disrupt normal sleep architecture. ?- Sleep hygiene should be reviewed to assess factors that may improve sleep quality. ?- Weight management and regular exercise should be initiated or continued. ? ?[Electronically signed] 12/18/2021 12:23 PM ? ?Baird Lyons MD, ABSM ?Diplomate, Tax adviser of Sleep Medicine ?NPI: [id number] ?  ? ? ? ? ? ? ? ? ? ? ? ? ? ? ? ? ? ? ? ? ? ?[name] ?Diplomate, Tax adviser of Sleep Medicine ? ?ELECTRONICALLY SIGNED ON:  12/18/2021, 12:20 PM ?[place] ?PH: [phone]   FX: [phone] ?ACCREDITED BY THE AMERICAN ACADEMY OF SLEEP MEDICINE ?

## 2021-12-20 ENCOUNTER — Encounter: Payer: Self-pay | Admitting: Critical Care Medicine

## 2021-12-22 ENCOUNTER — Other Ambulatory Visit: Payer: Self-pay | Admitting: Critical Care Medicine

## 2021-12-22 NOTE — Telephone Encounter (Signed)
Requested Prescriptions  ?Pending Prescriptions Disp Refills  ?? rOPINIRole (REQUIP) 4 MG tablet [Pharmacy Med Name: ROPINIROLE HCL 4 MG TABLET] 30 tablet 1  ?  Sig: TAKE 1 TABLET BY MOUTH AT BEDTIME.  ?  ? Neurology:  Parkinsonian Agents Passed - 12/22/2021 11:32 AM  ?  ?  Passed - Last BP in normal range  ?  BP Readings from Last 1 Encounters:  ?10/19/21 137/87  ?   ?  ?  Passed - Last Heart Rate in normal range  ?  Pulse Readings from Last 1 Encounters:  ?10/19/21 92  ?   ?  ?  Passed - Valid encounter within last 12 months  ?  Recent Outpatient Visits   ?      ? 4 months ago Type 2 diabetes mellitus with diabetic polyneuropathy, with long-term current use of insulin ([place])  ? [place] Elsie Stain, MD  ? 1 year ago Type 2 diabetes mellitus with diabetic polyneuropathy, with long-term current use of insulin ([place])  ? [place] Elsie Stain, MD  ? 1 year ago Type 2 diabetes mellitus with diabetic polyneuropathy, with long-term current use of insulin ([place])  ? [place] Elsie Stain, MD  ? 1 year ago Mild persistent asthma, unspecified whether complicated  ? [facility] [place], PA-C  ? 1 year ago Acute pain of left shoulder  ? [facility] at [place], PA-C  ?  ?  ?Future Appointments   ?        ? In 2 months Elsie Stain, MD [place]  ?  ? ?  ?  ?  ? ? ?

## 2021-12-24 ENCOUNTER — Other Ambulatory Visit: Payer: Self-pay | Admitting: Critical Care Medicine

## 2021-12-24 DIAGNOSIS — E1140 Type 2 diabetes mellitus with diabetic neuropathy, unspecified: Secondary | ICD-10-CM

## 2021-12-27 NOTE — Telephone Encounter (Signed)
last RF 09/20/21 #90 1 RF ?Requested Prescriptions  ?Refused Prescriptions Disp Refills  ?? nortriptyline (PAMELOR) 50 MG capsule [Pharmacy Med Name: NORTRIPTYLINE HCL 50 MG CAP] 90 capsule 1  ?  Sig: TAKE 1 CAPSULE BY MOUTH EVERYDAY AT BEDTIME  ?  ? Psychiatry:  Antidepressants - Heterocyclics (TCAs) Passed - 12/24/2021 10:49 AM  ?  ?  Passed - Completed PHQ-2 or PHQ-9 in the last 360 days  ?  ?  Passed - Valid encounter within last 6 months  ?  Recent Outpatient Visits   ?      ? 4 months ago Type 2 diabetes mellitus with diabetic polyneuropathy, with long-term current use of insulin (HCC)  ? [facility] Storm Frisk, MD  ? 1 year ago Type 2 diabetes mellitus with diabetic polyneuropathy, with long-term current use of insulin (HCC)  ? [facility] Storm Frisk, MD  ? 1 year ago Type 2 diabetes mellitus with diabetic polyneuropathy, with long-term current use of insulin (HCC)  ? [facility] Storm Frisk, MD  ? 1 year ago Mild persistent asthma, unspecified whether complicated  ? [facility] at [facility], [place], PA-C  ? 1 year ago Acute pain of left shoulder  ? [facility] at [facility], [place], PA-C  ?  ?  ?Future Appointments   ?        ? In 1 month Delford Field Charlcie Cradle, MD [facility]  ?  ? ?  ?  ?  ? ? ?

## 2021-12-31 ENCOUNTER — Encounter: Payer: Self-pay | Admitting: Critical Care Medicine

## 2021-12-31 DIAGNOSIS — E1165 Type 2 diabetes mellitus with hyperglycemia: Secondary | ICD-10-CM

## 2021-12-31 DIAGNOSIS — M79671 Pain in right foot: Secondary | ICD-10-CM

## 2022-01-13 ENCOUNTER — Ambulatory Visit: Payer: Medicare Other | Admitting: Podiatry

## 2022-01-16 ENCOUNTER — Other Ambulatory Visit: Payer: Self-pay | Admitting: Critical Care Medicine

## 2022-01-17 NOTE — Telephone Encounter (Signed)
Requested Prescriptions  Pending Prescriptions Disp Refills  . rOPINIRole (REQUIP) 4 MG tablet [Pharmacy Med Name: ROPINIROLE HCL 4 MG TABLET] 30 tablet 1    Sig: TAKE 1 TABLET BY MOUTH EVERYDAY AT BEDTIME     Neurology:  Parkinsonian Agents Passed - 01/16/2022  8:31 AM      Passed - Last BP in normal range    BP Readings from Last 1 Encounters:  10/19/21 137/87         Passed - Last Heart Rate in normal range    Pulse Readings from Last 1 Encounters:  10/19/21 92         Passed - Valid encounter within last 12 months    Recent Outpatient Visits          4 months ago Type 2 diabetes mellitus with diabetic polyneuropathy, with long-term current use of insulin (HCC)   [facility] Storm Frisk, MD   1 year ago Type 2 diabetes mellitus with diabetic polyneuropathy, with long-term current use of insulin (HCC)    [facility] Storm Frisk, MD   1 year ago Type 2 diabetes mellitus with diabetic polyneuropathy, with long-term current use of insulin (HCC)    [facility] Storm Frisk, MD   1 year ago Mild persistent asthma, unspecified whether complicated   [facility] at [facility], Kasandra Knudsen, PA-C   1 year ago Acute pain of left shoulder   [facility] at [facility], Kasandra Knudsen, PA-C      Future Appointments            In 1 month Delford Field Charlcie Cradle, MD [facility]

## 2022-01-26 ENCOUNTER — Ambulatory Visit: Payer: Medicare Other | Admitting: Podiatry

## 2022-01-30 ENCOUNTER — Encounter: Payer: Self-pay | Admitting: Critical Care Medicine

## 2022-01-30 MED ORDER — MONTELUKAST SODIUM 10 MG PO TABS: 10.0000 mg | ORAL_TABLET | Freq: Every day | ORAL | 2 refills | Status: DC

## 2022-02-01 ENCOUNTER — Ambulatory Visit (INDEPENDENT_AMBULATORY_CARE_PROVIDER_SITE_OTHER): Payer: Medicare Other | Admitting: Podiatrist

## 2022-02-01 ENCOUNTER — Ambulatory Visit: Payer: Medicare Other | Admitting: Podiatrist

## 2022-02-01 ENCOUNTER — Encounter: Payer: Self-pay | Admitting: Podiatrist

## 2022-02-01 VITALS — Temp 96.8°F

## 2022-02-01 DIAGNOSIS — L97521 Non-pressure chronic ulcer of other part of left foot limited to breakdown of skin: Secondary | ICD-10-CM | POA: Diagnosis not present

## 2022-02-01 DIAGNOSIS — Z9861 Coronary angioplasty status: Secondary | ICD-10-CM

## 2022-02-01 DIAGNOSIS — I2510 Atherosclerotic heart disease of native coronary artery without angina pectoris: Secondary | ICD-10-CM

## 2022-02-01 NOTE — Patient Instructions (Signed)
WOUND CARE INSTRUCTIONS:    - CLEANSE ULCER WITH SALINE OR WOUND CLEANSER.  - DAB DRY WITH GAUZE SPONGE.  -APPLY A LIGHT AMOUNT OF Iodosorb Gel TO BASE OF ULCER.  (Orange stuff in the tube- If you run out, you can buy more on Amazon for around $25 for a 10gm tube)  I - cover with a bandage large enough to cover the area - change the dressing every day    -  IF YOU EXPERIENCE ANY FEVER, CHILLS, NIGHTSWEATS, NAUSEA OR VOMITING, ELEVATED OR LOW BLOOD SUGARS, REPORT TO EMERGENCY ROOM.  - IF YOU EXPERIENCE INCREASED REDNESS, PAIN, SWELLING, DISCOLORATION, ODOR, PUS, DRAINAGE OR WARMTH OF YOUR FOOT, REPORT TO EMERGENCY ROOM.

## 2022-02-01 NOTE — Progress Notes (Unsigned)
Chief Complaint  Patient presents with   Nail Problem    The toenail came off on the left big toe about a month ago and drains some and there was not any injury and I do have neuropathy      HPI: Patient is 51 y.o. male who presents today for non healing wound left great toe.   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) 11/03/2020   Asthma, moderate persistent 09/26/2020   Atelectasis 09/26/2020   Neuropathy 12/17/2018   Type 2 diabetes mellitus with diabetic polyneuropathy, with long-term current use of insulin (HCC) 11/28/2018   PSVT (paroxysmal supraventricular tachycardia) (HCC) 10/22/2018   Ulnar neuropathy of both upper extremities 09/20/2018   CAD S/P percutaneous coronary angioplasty 09/17/2018   Dyslipidemia, goal LDL below 70 09/17/2018   Essential hypertension    Anxiety with depression 05/07/2017   Obstructive sleep apnea 05/29/2016   Exertional dyspnea 03/24/2016   Family history of amyotrophic lateral sclerosis 05/14/2015   History of stroke 04/08/2015    Current Outpatient Medications on File Prior to Visit  Medication Sig Dispense Refill   albuterol (VENTOLIN HFA) 108 (90 Base) MCG/ACT inhaler TAKE 2 PUFFS BY MOUTH EVERY 6 HOURS AS NEEDED FOR WHEEZE OR SHORTNESS OF BREATH 8.5 each 11   aspirin EC 81 MG tablet Take 81 mg by mouth daily.     atorvastatin (LIPITOR) 80 MG tablet  Take 1 tablet (80 mg total) by mouth daily. 90 tablet 3   cetirizine (ZYRTEC) 10 MG tablet Take 1 tablet (10 mg total) by mouth daily. 30 tablet 11   Continuous Blood Gluc Receiver (FREESTYLE LIBRE 14 DAY READER) DEVI 1 Device by Does not apply route as directed. 1 each 0   Continuous Blood Gluc Sensor (FREESTYLE LIBRE 14 DAY SENSOR) MISC 1 Device by Does not apply route as directed. 2 each 11   dapagliflozin propanediol (FARXIGA) 10 MG TABS tablet Take 1 tablet (10 mg total) by mouth daily. 90 tablet 3   dicyclomine (BENTYL) 20 MG tablet TAKE 1 TABLET (20 MG TOTAL) BY MOUTH 4 (FOUR) TIMES DAILY AS NEEDED (ABDOMINAL CRAMPING). 360 tablet 1   EPINEPHrine (EPIPEN JR) 0.15 MG/0.3ML injection Inject 0.15 mg into the muscle as needed for anaphylaxis. 2 each 2   gabapentin (NEURONTIN) 600 MG tablet Take 6 tablets by mouth at bedtime.     glucose blood test strip 4x daily 150 each 12   hydrOXYzine (VISTARIL) 25 MG capsule Take 1 capsule (25 mg total) by mouth every 8 (eight) hours as needed for itching. 60 capsule 0   insulin aspart (NOVOLOG FLEXPEN) 100 UNIT/ML FlexPen Max daily 102 units 90 mL 3   insulin glargine (LANTUS SOLOSTAR) 100 UNIT/ML Solostar Pen Inject 34 Units into the skin daily. 45 mL 3   Insulin Pen Needle 30G X 5 MM MISC 1 Device by Does not apply route in the morning, at noon, in the evening, and at bedtime. 400 each  3   metFORMIN (GLUCOPHAGE) 1000 MG tablet Take 1 tablet (1,000 mg total) by mouth 2 (two) times daily with a meal. 180 tablet 3   metoprolol succinate (TOPROL-XL) 100 MG 24 hr tablet TAKE 1 TABLET BY MOUTH EVERY DAY 180 tablet 3   montelukast (SINGULAIR) 10 MG tablet Take 1 tablet (10 mg total) by mouth daily. 90 tablet 2   nitroGLYCERIN (NITRODUR - DOSED IN MG/24 HR) 0.4 mg/hr patch Place 1 patch (0.4 mg total) onto the skin daily. 60 patch 2   nitroGLYCERIN (NITROSTAT) 0.4 MG SL tablet PLACE 1 TABLET UNDER THE TONGUE EVERY 5 MINUTES AS NEEDED FOR CHEST PAIN. 25 tablet 3    nortriptyline (PAMELOR) 50 MG capsule TAKE 1 CAPSULE BY MOUTH EVERYDAY AT BEDTIME 90 capsule 1   pantoprazole (PROTONIX) 40 MG tablet Take 1 tablet (40 mg total) by mouth daily. 90 tablet 3   pregabalin (LYRICA) 300 MG capsule Take 300 mg by mouth 2 (two) times daily.     ramipril (ALTACE) 5 MG capsule TAKE 2 CAPSULES (10 MG TOTAL) BY MOUTH DAILY. 180 capsule 3   rOPINIRole (REQUIP) 4 MG tablet TAKE 1 TABLET BY MOUTH EVERYDAY AT BEDTIME 30 tablet 0   silver sulfADIAZINE (SILVADENE) 1 % cream Apply pea-sized amount to wound daily. 50 g 0   No current facility-administered medications on file prior to visit.    Allergies  Allergen Reactions   Ambien [Zolpidem Tartrate] Other (See Comments)    HALLUCINATIONS   Shellfish Allergy Anaphylaxis    Seafood    Review of Systems No fevers, chills, nausea, muscle aches, no difficulty breathing, no calf pain, no chest pain or shortness of breath.   Physical Exam  GENERAL APPEARANCE: Alert, conversant. Appropriately groomed. No acute distress.   VASCULAR: Pedal pulses palpable DP and PT bilateral.  Capillary refill time is immediate to all digits,  Proximal to distal cooling it warm to warm.  Digital perfusion adequate.   NEUROLOGIC: sensation is intact to 5.07 monofilament at 5/5 sites bilateral.  Light touch is intact bilateral, vibratory sensation intact bilateral  MUSCULOSKELETAL: acceptable muscle strength, tone and stability bilateral.  No gross boney pedal deformities noted.  No pain, crepitus or limitation noted with foot and ankle range of motion bilateral.   DERMATOLOGIC: skin is warm, supple, and dry.  Color, texture, and turgor of skin within normal limits.  No open wounds are noted.  No preulcerative lesions are seen.  Digital nails are asymptomatic.      Assessment   ***  Plan  ***

## 2022-02-02 ENCOUNTER — Encounter: Payer: Self-pay | Admitting: Critical Care Medicine

## 2022-02-02 DIAGNOSIS — G4733 Obstructive sleep apnea (adult) (pediatric): Secondary | ICD-10-CM

## 2022-02-06 ENCOUNTER — Encounter (HOSPITAL_BASED_OUTPATIENT_CLINIC_OR_DEPARTMENT_OTHER): Payer: Self-pay | Admitting: Cardiovascular Disease

## 2022-02-06 ENCOUNTER — Encounter: Payer: Self-pay | Admitting: Critical Care Medicine

## 2022-02-08 NOTE — Telephone Encounter (Signed)
Following up with you  °

## 2022-02-09 ENCOUNTER — Encounter: Payer: Self-pay | Admitting: Critical Care Medicine

## 2022-02-10 ENCOUNTER — Telehealth: Payer: Medicare Other | Admitting: Family Medicine

## 2022-02-10 ENCOUNTER — Encounter: Payer: Self-pay | Admitting: Critical Care Medicine

## 2022-02-10 DIAGNOSIS — E1165 Type 2 diabetes mellitus with hyperglycemia: Secondary | ICD-10-CM

## 2022-02-10 DIAGNOSIS — K529 Noninfective gastroenteritis and colitis, unspecified: Secondary | ICD-10-CM

## 2022-02-10 DIAGNOSIS — E1142 Type 2 diabetes mellitus with diabetic polyneuropathy: Secondary | ICD-10-CM

## 2022-02-10 DIAGNOSIS — R197 Diarrhea, unspecified: Secondary | ICD-10-CM

## 2022-02-10 DIAGNOSIS — R1013 Epigastric pain: Secondary | ICD-10-CM

## 2022-02-10 NOTE — Patient Instructions (Signed)
Please go to your nearest Urgent Care to be evaluated given your on going symptoms and no improve from your medications.   We hope you feel better soon.

## 2022-02-10 NOTE — Progress Notes (Signed)
[place]   Needs to have in person eval given duration of symptoms and limited improvement with Rx of Bentyl and diet adjustment.  Patient acknowledged agreement and understanding of the plan.

## 2022-02-10 NOTE — Telephone Encounter (Signed)
See message.  About change in appt date/time

## 2022-02-13 ENCOUNTER — Telehealth: Payer: Self-pay

## 2022-02-13 NOTE — Telephone Encounter (Signed)
Patient returned call. Please advise if PCP will work him in or if patient needs to be [facility] and placed on wait list.

## 2022-02-13 NOTE — Telephone Encounter (Signed)
Called pt and left vm pt want to change appt from July 11 to the 12 after 145 or 345. Pcp only works half day on Wednesday

## 2022-02-13 NOTE — Telephone Encounter (Signed)
Called and pt was re-scheduled.

## 2022-02-17 ENCOUNTER — Ambulatory Visit: Payer: Medicare Other | Admitting: Podiatrist

## 2022-02-20 ENCOUNTER — Encounter: Payer: Self-pay | Admitting: Internal Medicine

## 2022-02-21 ENCOUNTER — Ambulatory Visit: Payer: Medicare Other | Admitting: Critical Care Medicine

## 2022-02-22 ENCOUNTER — Encounter: Payer: Self-pay | Admitting: Critical Care Medicine

## 2022-02-22 ENCOUNTER — Ambulatory Visit: Payer: Medicare Other | Attending: Critical Care Medicine | Admitting: Critical Care Medicine

## 2022-02-22 ENCOUNTER — Telehealth: Payer: Self-pay | Admitting: Emergency Medicine

## 2022-02-22 ENCOUNTER — Ambulatory Visit (INDEPENDENT_AMBULATORY_CARE_PROVIDER_SITE_OTHER): Payer: Medicare Other | Admitting: Podiatry

## 2022-02-22 VITALS — BP 120/78 | HR 77 | Wt 264.0 lb

## 2022-02-22 DIAGNOSIS — E1142 Type 2 diabetes mellitus with diabetic polyneuropathy: Secondary | ICD-10-CM | POA: Insufficient documentation

## 2022-02-22 DIAGNOSIS — L97521 Non-pressure chronic ulcer of other part of left foot limited to breakdown of skin: Secondary | ICD-10-CM

## 2022-02-22 DIAGNOSIS — E1165 Type 2 diabetes mellitus with hyperglycemia: Secondary | ICD-10-CM

## 2022-02-22 DIAGNOSIS — Z7984 Long term (current) use of oral hypoglycemic drugs: Secondary | ICD-10-CM | POA: Insufficient documentation

## 2022-02-22 DIAGNOSIS — K219 Gastro-esophageal reflux disease without esophagitis: Secondary | ICD-10-CM | POA: Diagnosis not present

## 2022-02-22 DIAGNOSIS — Z9989 Dependence on other enabling machines and devices: Secondary | ICD-10-CM | POA: Diagnosis not present

## 2022-02-22 DIAGNOSIS — G2581 Restless legs syndrome: Secondary | ICD-10-CM

## 2022-02-22 DIAGNOSIS — Z7982 Long term (current) use of aspirin: Secondary | ICD-10-CM | POA: Diagnosis not present

## 2022-02-22 DIAGNOSIS — I2510 Atherosclerotic heart disease of native coronary artery without angina pectoris: Secondary | ICD-10-CM | POA: Insufficient documentation

## 2022-02-22 DIAGNOSIS — Z7901 Long term (current) use of anticoagulants: Secondary | ICD-10-CM | POA: Diagnosis not present

## 2022-02-22 DIAGNOSIS — I5042 Chronic combined systolic (congestive) and diastolic (congestive) heart failure: Secondary | ICD-10-CM

## 2022-02-22 DIAGNOSIS — J4540 Moderate persistent asthma, uncomplicated: Secondary | ICD-10-CM

## 2022-02-22 DIAGNOSIS — I110 Hypertensive heart disease with heart failure: Secondary | ICD-10-CM | POA: Diagnosis not present

## 2022-02-22 DIAGNOSIS — E11621 Type 2 diabetes mellitus with foot ulcer: Secondary | ICD-10-CM | POA: Diagnosis not present

## 2022-02-22 DIAGNOSIS — Z794 Long term (current) use of insulin: Secondary | ICD-10-CM

## 2022-02-22 DIAGNOSIS — G4733 Obstructive sleep apnea (adult) (pediatric): Secondary | ICD-10-CM

## 2022-02-22 DIAGNOSIS — L97529 Non-pressure chronic ulcer of other part of left foot with unspecified severity: Secondary | ICD-10-CM | POA: Diagnosis not present

## 2022-02-22 DIAGNOSIS — Z9861 Coronary angioplasty status: Secondary | ICD-10-CM | POA: Diagnosis not present

## 2022-02-22 DIAGNOSIS — I471 Supraventricular tachycardia, unspecified: Secondary | ICD-10-CM

## 2022-02-22 DIAGNOSIS — I10 Essential (primary) hypertension: Secondary | ICD-10-CM | POA: Diagnosis not present

## 2022-02-22 DIAGNOSIS — E785 Hyperlipidemia, unspecified: Secondary | ICD-10-CM | POA: Diagnosis not present

## 2022-02-22 DIAGNOSIS — Z8616 Personal history of COVID-19: Secondary | ICD-10-CM | POA: Insufficient documentation

## 2022-02-22 DIAGNOSIS — Z79899 Other long term (current) drug therapy: Secondary | ICD-10-CM | POA: Diagnosis not present

## 2022-02-22 LAB — GLUCOSE, POCT (MANUAL RESULT ENTRY): POC Glucose: 195 mg/dl — AB (ref 70–99)

## 2022-02-22 LAB — POCT GLYCOSYLATED HEMOGLOBIN (HGB A1C): HbA1c, POC (controlled diabetic range): 7.9 % — AB (ref 0.0–7.0)

## 2022-02-22 MED ORDER — NITROGLYCERIN 0.4 MG/HR TD PT24: 0.4000 mg | MEDICATED_PATCH | Freq: Every day | TRANSDERMAL | 2 refills | Status: DC

## 2022-02-22 MED ORDER — DAPAGLIFLOZIN PROPANEDIOL 10 MG PO TABS
10.0000 mg | ORAL_TABLET | Freq: Every day | ORAL | 3 refills | Status: DC
Start: 2022-02-22 — End: 2022-03-13

## 2022-02-22 MED ORDER — INSULIN PEN NEEDLE 30G X 5 MM MISC: 1.0000 | Freq: Four times a day (QID) | 3 refills | Status: DC

## 2022-02-22 MED ORDER — RAMIPRIL 5 MG PO CAPS: ORAL_CAPSULE | ORAL | 3 refills | Status: DC

## 2022-02-22 MED ORDER — PANTOPRAZOLE SODIUM 40 MG PO TBEC: 40.0000 mg | DELAYED_RELEASE_TABLET | Freq: Every day | ORAL | 3 refills | Status: DC

## 2022-02-22 MED ORDER — ATORVASTATIN CALCIUM 80 MG PO TABS: 80.0000 mg | ORAL_TABLET | Freq: Every day | ORAL | 3 refills | Status: DC

## 2022-02-22 MED ORDER — DEXCOM G6 SENSOR MISC: 6 refills | Status: DC

## 2022-02-22 MED ORDER — NOVOLOG FLEXPEN 100 UNIT/ML ~~LOC~~ SOPN
PEN_INJECTOR | SUBCUTANEOUS | 3 refills | Status: DC
Start: 2022-02-22 — End: 2022-03-13

## 2022-02-22 MED ORDER — PREGABALIN 300 MG PO CAPS
300.0000 mg | ORAL_CAPSULE | Freq: Two times a day (BID) | ORAL | 2 refills | Status: DC
Start: 2022-02-22 — End: 2022-03-12

## 2022-02-22 MED ORDER — GABAPENTIN 600 MG PO TABS: 3600.0000 mg | ORAL_TABLET | Freq: Every day | ORAL | 2 refills | Status: DC

## 2022-02-22 MED ORDER — DEXCOM G6 RECEIVER DEVI: 0 refills | Status: DC

## 2022-02-22 MED ORDER — METOPROLOL SUCCINATE ER 100 MG PO TB24: 100.0000 mg | ORAL_TABLET | Freq: Every day | ORAL | 3 refills | Status: DC

## 2022-02-22 MED ORDER — DEXCOM G6 TRANSMITTER MISC: 0 refills | Status: DC

## 2022-02-22 MED ORDER — ROPINIROLE HCL 4 MG PO TABS: ORAL_TABLET | ORAL | 2 refills | Status: DC

## 2022-02-22 MED ORDER — LANTUS SOLOSTAR 100 UNIT/ML ~~LOC~~ SOPN: 34.0000 [IU] | PEN_INJECTOR | Freq: Every day | SUBCUTANEOUS | 3 refills | Status: DC

## 2022-02-22 MED ORDER — METFORMIN HCL 1000 MG PO TABS
1000.0000 mg | ORAL_TABLET | Freq: Two times a day (BID) | ORAL | 3 refills | Status: DC
Start: 2022-02-22 — End: 2022-03-13

## 2022-02-22 NOTE — Assessment & Plan Note (Signed)
Continues with gabapentin and Lyrica

## 2022-02-22 NOTE — Assessment & Plan Note (Signed)
Stable at this time no changes

## 2022-02-22 NOTE — Progress Notes (Signed)
Established Patient Office Visit  Subjective:  Patient ID: Austin Vaughan, male    DOB: 06/06/1971  Age: 51 y.o. MRN: [id number]  CC:  Chief Complaint  Patient presents with   Diabetes    HPI 1/10 Austin Vaughan presents for primary care follow up. He was last contacted by this office in August 2022 for COVID-19 infection, for which he was prescribed Molnupiravir. He has no residual symptoms from that at this time.  At this point, his diabetes remains poorly controlled. He sees endocrinologist Dr. Kelton Pillar who provides most of the management for his diabetes. He uses the [employer] to track his blood sugar, but sometimes leaves the device off, so most of the blood sugar readings have been from the afternoon and evening and are typically in the 200s. Per his monitor, his fasting blood sugar appears to be in the 170s. He has been taking his long-acting insulin as prescribed at night. He has been prescribed short-acting insulin, but only takes it twice a day, as he wakes up in the late morning and does not eat breakfast. His diet mostly includes chicken, steak, rice, and beans. He does not regularly get any sort of physical activity. At today's visit, his point of care HgBA1c was 9.3%, with CBG 273 up from his visit to this clinic six months ago when it was 8.8%.  The patient saw cardiology in November due to some persistent chest pain. At that time, it was determined that he had some severe reflux. He began pantoprazole for this which he states has helped greatly. Multiple labs were also ordered by cardiology at that time. Cholesterol was at therapeutic goal, and kidney, liver, and electrolytes were all in normal ranges. Glucose was also very elevated at that time.   The patient has also followed up with other members of his care team. He saw podiatry in December 2022 and received ingrown toenail removal. He saw dentistry about six months ago and had some teeth extracted. He plans to follow up  with them again for additional extractions. He also states that he has seen ophthalmology about 6 months ago  Today the patient complains of continued fatigue and feelings of not being well-rested. He admits to occasionally waking up at night, gasping for air. He states he has had a sleep study in the past, but that was quite a few years ago when he lived in [place]. His only other complaint is a feeling of itching, mostly on his legs and scalp. He has not noticed any rashes despite the itching.   The patient is up to date on all vaccinations and age-appropriate screenings.  7/12 Patient is seen in return follow-up and since the last visit patient has been seen by cardiology in March of this year below is documentations from that visit.  Also patient's had follow-up with endocrinology they are trying to get him an insulin pump and they wish him to get onto a continuous glucose monitoring device  Note the patient has moved to [place] but wishes to keep his specialist and primary care in [place]   Patient has also been followed by podiatry closely for ulceration of the left great toe this wound is healing he was wearing crocs at the [employer] and developed a sore in the left foot.  He knows better to do this from now on and avoid this type of footwear.  Patient has a CPAP machine now is wearing a mask is getting a new  1 to fit better.  CPAP titration shows 15 cm is the adequate pressure for this patient.  Patient went to urgent care recently for diarrhea syndrome which is now resolved.  He thought it was due to secondary effect of a topical medication applied to the foot.  He has had no further chest pain recently.  He is trying to eat a healthier diet at this time.  Blood sugar on arrival 195 and A1c 7.9.  Blood pressure 120/70  Saw CARDS 10/2021 05/05/2019 Echocardiogram IMPRESSIONS     1. Left ventricular ejection fraction, by visual estimation, is 55 to  60%. The left  ventricle has normal function. Normal left ventricular size.  There is no left ventricular hypertrophy.   2. Left ventricular diastolic Doppler parameters are consistent with  impaired relaxation pattern of LV diastolic filling.   3. The aortic valve is tricuspid Aortic valve regurgitation was not  visualized by color flow Doppler. Mild aortic valve sclerosis without  stenosis.   4. Global right ventricle has normal systolic function.The right  ventricular size is normal. No increase in right ventricular wall  thickness.   5. Right atrial size was normal.   6. Left atrial size was normal.   7. The inferior vena cava is normal in size with greater than 50%  respiratory variability, suggesting right atrial pressure of 3 mmHg.   8. The mitral valve is normal in structure. No evidence of mitral valve  regurgitation. No evidence of mitral stenosis.   9. The tricuspid valve is normal in structure. Tricuspid valve  regurgitation was not visualized by color flow Doppler.  10. TR signal is inadequate for assessing pulmonary artery systolic  pressure.    Nuclear stress test 07/01/2021     The study is normal. The study is low risk.   No ST deviation was noted.   Left ventricular function is normal. End diastolic cavity size is normal. End systolic cavity size is normal.   Prior study available for comparison from 04/19/2018.   Normal stress nuclear study with no ischemia or infarction.  Gated ejection fraction 57% with normal wall motion.   EKG: None today.       ASSESSMENT & PLAN      Coronary artery disease-denies recent episodes of arm neck back or chest discomfort.  Underwent nuclear stress test 11/22.  This showed low risk and EF of 57.  Details above.  Underwent previous cardiac catheterization 9/19 which showed no ischemia and LV EF of 53%. Continue aspirin, atorvastatin, metoprolol Heart healthy low-sodium diet-salty 6 reviewed Increase physical activity as tolerated    GERD-continues to take protonix medication.  Reports only occasional episodes of discomfort since starting medication.   GERD diet instructions reviewed. Elevate head of bed Weight loss encouraged   Palpitations-cardiac event monitor previously showed rare PACs and PVCs with short runs of SVT. Continue metoprolol Increase physical activity as tolerated Heart healthy low-sodium diet Avoid triggers caffeine, chocolate, EtOH, dehydration etc.   Essential hypertension-BP today 137/87.  Well-controlled at home. 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  Weight stable.  No increased DOE or activity intolerance.  Echocardiogram 05/05/2019 showed LVEF of 50-60% with impaired diastolic relaxation and mild aortic valve sclerosis. Heart healthy low-sodium diet Daily weights-contact office with a weight increase of 2 pounds overnight or 5 pounds in 1 week Elevate lower extremities when not active   OSA-sleep evaluation 10/18/2021. Follows with Dr.  Joya Gaskins     Past Medical History:  Diagnosis Date   Coronary artery disease    Diabetes mellitus without complication ([place])    Diabetic foot ulcer ([place]) 08/23/2019   GERD (gastroesophageal reflux disease) 06/21/2021   Hypertension     Past Surgical History:  Procedure Laterality Date   CORONARY ANGIOPLASTY WITH STENT PLACEMENT  07/2002    Family History  Problem Relation Age of Onset   ALS Mother    Lung cancer Father    ALS Maternal Aunt    Heart disease Paternal Grandmother    Heart disease Paternal Grandfather     Social History   Socioeconomic History   Marital status: Married    Spouse name: Not on file   Number of children: 2   Years of education: 12   Highest education level: Not on file  Occupational History   Occupation: disablitiy  Tobacco Use   Smoking status: Never   Smokeless tobacco: Never  Vaping Use    Vaping Use: Never used  Substance and Sexual Activity   Alcohol use: Not Currently   Drug use: Never   Sexual activity: Yes  Other Topics Concern   Not on file  Social History Narrative   Wife and patient relocated to [place] from [place] a few months ago. - 06/11/18   He is on disability since 2019.     He lives with wife and daughter in a one-level apartment.    Right handed   Social Determinants of Health   Financial Resource Strain: Not on file  Food Insecurity: Not on file  Transportation Needs: Not on file  Physical Activity: Not on file  Stress: Not on file  Social Connections: Not on file  Intimate Partner Violence: Not on file    Outpatient Medications Prior to Visit  Medication Sig Dispense Refill   albuterol (VENTOLIN HFA) 108 (90 Base) MCG/ACT inhaler TAKE 2 PUFFS BY MOUTH EVERY 6 HOURS AS NEEDED FOR WHEEZE OR SHORTNESS OF BREATH 8.5 each 11   aspirin EC 81 MG tablet Take 81 mg by mouth daily.     cetirizine (ZYRTEC) 10 MG tablet Take 1 tablet (10 mg total) by mouth daily. 30 tablet 11   dicyclomine (BENTYL) 20 MG tablet TAKE 1 TABLET (20 MG TOTAL) BY MOUTH 4 (FOUR) TIMES DAILY AS NEEDED (ABDOMINAL CRAMPING). 360 tablet 1   EPINEPHrine (EPIPEN JR) 0.15 MG/0.3ML injection Inject 0.15 mg into the muscle as needed for anaphylaxis. 2 each 2   glucose blood test strip 4x daily 150 each 12   hydrOXYzine (VISTARIL) 25 MG capsule Take 1 capsule (25 mg total) by mouth every 8 (eight) hours as needed for itching. 60 capsule 0   montelukast (SINGULAIR) 10 MG tablet Take 1 tablet (10 mg total) by mouth daily. 90 tablet 2   nitroGLYCERIN (NITROSTAT) 0.4 MG SL tablet PLACE 1 TABLET UNDER THE TONGUE EVERY 5 MINUTES AS NEEDED FOR CHEST PAIN. 25 tablet 3   nortriptyline (PAMELOR) 50 MG capsule TAKE 1 CAPSULE BY MOUTH EVERYDAY AT BEDTIME 90 capsule 1   atorvastatin (LIPITOR) 80 MG tablet Take 1 tablet (80 mg total) by mouth daily. 90 tablet 3   Continuous Blood Gluc Receiver  (FREESTYLE LIBRE 14 DAY READER) DEVI 1 Device by Does not apply route as directed. 1 each 0   Continuous Blood Gluc Sensor (FREESTYLE LIBRE 14 DAY SENSOR) MISC 1 Device by Does not apply route as directed. 2 each 11   dapagliflozin propanediol (FARXIGA) 10 MG  TABS tablet Take 1 tablet (10 mg total) by mouth daily. 90 tablet 3   gabapentin (NEURONTIN) 600 MG tablet Take 6 tablets by mouth at bedtime.     insulin aspart (NOVOLOG FLEXPEN) 100 UNIT/ML FlexPen Max daily 102 units 90 mL 3   insulin glargine (LANTUS SOLOSTAR) 100 UNIT/ML Solostar Pen Inject 34 Units into the skin daily. 45 mL 3   Insulin Pen Needle 30G X 5 MM MISC 1 Device by Does not apply route in the morning, at noon, in the evening, and at bedtime. 400 each 3   metFORMIN (GLUCOPHAGE) 1000 MG tablet Take 1 tablet (1,000 mg total) by mouth 2 (two) times daily with a meal. 180 tablet 3   metoprolol succinate (TOPROL-XL) 100 MG 24 hr tablet TAKE 1 TABLET BY MOUTH EVERY DAY 180 tablet 3   nitroGLYCERIN (NITRODUR - DOSED IN MG/24 HR) 0.4 mg/hr patch Place 1 patch (0.4 mg total) onto the skin daily. 60 patch 2   pantoprazole (PROTONIX) 40 MG tablet Take 1 tablet (40 mg total) by mouth daily. 90 tablet 3   pregabalin (LYRICA) 300 MG capsule Take 300 mg by mouth 2 (two) times daily.     ramipril (ALTACE) 5 MG capsule TAKE 2 CAPSULES (10 MG TOTAL) BY MOUTH DAILY. 180 capsule 3   rOPINIRole (REQUIP) 4 MG tablet TAKE 1 TABLET BY MOUTH EVERYDAY AT BEDTIME 30 tablet 0   silver sulfADIAZINE (SILVADENE) 1 % cream Apply pea-sized amount to wound daily. 50 g 0   No facility-administered medications prior to visit.    Allergies  Allergen Reactions   Ambien [Zolpidem Tartrate] Other (See Comments)    HALLUCINATIONS   Shellfish Allergy Anaphylaxis    Seafood    ROS Review of Systems  Constitutional:  Negative for fatigue.  HENT:  Negative for hearing loss, rhinorrhea and trouble swallowing.   Eyes: Negative.   Respiratory:  Negative for  shortness of breath (wakes up at night gasping for breath).   Cardiovascular:  Negative for palpitations and leg swelling (occasional foot swelling).  Gastrointestinal:  Negative for constipation, diarrhea, nausea and vomiting.  Endocrine: Negative for polydipsia.  Genitourinary:  Negative for frequency.  Musculoskeletal:  Negative for arthralgias (pain in feet, pain in hands).  Skin:  Negative for rash.  Neurological:  Positive for numbness (pain in hands). Negative for headaches.  Psychiatric/Behavioral:  Negative for sleep disturbance.       Objective:    Physical Exam Vitals reviewed.  Constitutional:      Appearance: Normal appearance. He is well-developed. He is obese. He is not diaphoretic.  HENT:     Head: Normocephalic and atraumatic.     Nose: No nasal deformity, septal deviation, mucosal edema or rhinorrhea.     Right Sinus: No maxillary sinus tenderness or frontal sinus tenderness.     Left Sinus: No maxillary sinus tenderness or frontal sinus tenderness.     Mouth/Throat:     Mouth: Mucous membranes are moist.     Dentition: Abnormal dentition (extractions present).     Pharynx: Oropharynx is clear. No oropharyngeal exudate.  Eyes:     General: No scleral icterus.    Conjunctiva/sclera: Conjunctivae normal.     Pupils: Pupils are equal, round, and reactive to light.  Neck:     Thyroid: No thyromegaly.     Vascular: No carotid bruit or JVD.     Trachea: Trachea normal. No tracheal tenderness or tracheal deviation.  Cardiovascular:     Rate and Rhythm:  Normal rate and regular rhythm.     Chest Wall: PMI is not displaced.     Pulses: Normal pulses. No decreased pulses.     Heart sounds: Normal heart sounds, S1 normal and S2 normal. Heart sounds not distant. No murmur heard.    No systolic murmur is present.     No diastolic murmur is present.     No friction rub. No gallop. No S3 or S4 sounds.  Pulmonary:     Effort: Pulmonary effort is normal. No tachypnea,  accessory muscle usage or respiratory distress.     Breath sounds: Normal breath sounds. No stridor. No decreased breath sounds, wheezing, rhonchi or rales.  Chest:     Chest wall: No tenderness.  Abdominal:     General: Bowel sounds are normal. There is no distension.     Palpations: Abdomen is soft. Abdomen is not rigid.     Tenderness: There is no abdominal tenderness. There is no guarding or rebound.  Musculoskeletal:        General: Normal range of motion.     Cervical back: Normal range of motion and neck supple. No edema, erythema or rigidity. No muscular tenderness. Normal range of motion.     Comments: Foot exam performed patient is numb in all areas of on the soles of both feet  Lymphadenopathy:     Head:     Right side of head: No submental or submandibular adenopathy.     Left side of head: No submental or submandibular adenopathy.     Cervical: No cervical adenopathy.  Skin:    General: Skin is warm and dry.     Coloration: Skin is not pale.     Findings: No rash.     Nails: There is no clubbing.  Neurological:     General: No focal deficit present.     Mental Status: He is alert and oriented to person, place, and time.     Sensory: No sensory deficit.  Psychiatric:        Mood and Affect: Mood normal.        Speech: Speech normal.        Behavior: Behavior normal.        Thought Content: Thought content normal.     BP 120/78   Pulse 77   Wt 264 lb (119.7 kg)   SpO2 93%   BMI 33.90 kg/m  Wt Readings from Last 3 Encounters:  02/22/22 264 lb (119.7 kg)  12/13/21 260 lb (117.9 kg)  10/19/21 264 lb 12.8 oz (120.1 kg)     Health Maintenance Due  Topic Date Due   Zoster Vaccines- Shingrix (1 of 2) Never done    There are no preventive care reminders to display for this patient.  Lab Results  Component Value Date   TSH 1.570 04/21/2020   Lab Results  Component Value Date   WBC 7.2 10/12/2020   HGB 14.0 10/12/2020   HCT 44.0 10/12/2020   MCV 89  10/12/2020   PLT 319 10/12/2020   Lab Results  Component Value Date   NA 142 06/21/2021   K 4.3 06/21/2021   CO2 22 06/21/2021   GLUCOSE 259 (H) 06/21/2021   BUN 6 06/21/2021   CREATININE 0.70 (L) 06/21/2021   BILITOT 0.6 06/21/2021   ALKPHOS 151 (H) 06/21/2021   AST 14 06/21/2021   ALT 23 06/21/2021   PROT 7.4 06/21/2021   ALBUMIN 4.7 06/21/2021   CALCIUM 9.5 06/21/2021   ANIONGAP  10 08/25/2019   EGFR 111 06/21/2021   GFR 113.85 10/17/2018   Lab Results  Component Value Date   CHOL 114 06/21/2021   Lab Results  Component Value Date   HDL 41 06/21/2021   Lab Results  Component Value Date   LDLCALC 54 06/21/2021   Lab Results  Component Value Date   TRIG 101 06/21/2021   Lab Results  Component Value Date   CHOLHDL 2.8 06/21/2021   Lab Results  Component Value Date   HGBA1C 7.9 (A) 02/22/2022   5/2 Cpap titration;   IMPRESSIONS - The optimal PAP pressure was 15 cm of water. - Moderate oxygen desaturations were observed during this titration (min O2 = 85.0%). Mean O2 saturation on CPAP 15 was 94.2%. - The patient snored with soft snoring volume during this titration study. - No cardiac abnormalities were observed during this study. - Clinically significant periodic limb movements were not noted during this study. Arousals associated with PLMs were rare.   DIAGNOSIS - Obstructive Sleep Apnea (G47.33)   RECOMMENDATIONS - Trial of CPAP therapy on 15 cm H2O or autopap 10-20. - Patinet used a Medium size [employer] Full Face Simplus mask and heated humidification. - Be careful with alcohol, sedatives and other CNS depressants that may worsen sleep apnea and disrupt normal sleep architecture.     Assessment & Plan:   Problem List Items Addressed This Visit       Cardiovascular and Mediastinum   Essential hypertension    Hypertension well controlled at this time  Plan will be for the patient have refill sent to his new pharmacy at his new location in  [place]      Relevant Medications   atorvastatin (LIPITOR) 80 MG tablet   metoprolol succinate (TOPROL-XL) 100 MG 24 hr tablet   nitroGLYCERIN (NITRODUR - DOSED IN MG/24 HR) 0.4 mg/hr patch   ramipril (ALTACE) 5 MG capsule   PSVT (paroxysmal supraventricular tachycardia) (HCC)   Relevant Medications   atorvastatin (LIPITOR) 80 MG tablet   metoprolol succinate (TOPROL-XL) 100 MG 24 hr tablet   nitroGLYCERIN (NITRODUR - DOSED IN MG/24 HR) 0.4 mg/hr patch   ramipril (ALTACE) 5 MG capsule   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)    Stable at this time no changes      Relevant Medications   atorvastatin (LIPITOR) 80 MG tablet   metoprolol succinate (TOPROL-XL) 100 MG 24 hr tablet   nitroGLYCERIN (NITRODUR - DOSED IN MG/24 HR) 0.4 mg/hr patch   ramipril (ALTACE) 5 MG capsule     Respiratory   Obstructive sleep apnea    Patient now has CPAP machine setting of 15 cm water pressure      Asthma, moderate persistent    Stable moderate persistent asthma no change in inhaled medicines        Endocrine   Type 2 diabetes mellitus with diabetic polyneuropathy, with long-term current use of insulin (HCC) - Primary    Continues with gabapentin and Lyrica      Relevant Medications   atorvastatin (LIPITOR) 80 MG tablet   dapagliflozin propanediol (FARXIGA) 10 MG TABS tablet   insulin aspart (NOVOLOG FLEXPEN) 100 UNIT/ML FlexPen   insulin glargine (LANTUS SOLOSTAR) 100 UNIT/ML Solostar Pen   metFORMIN (GLUCOPHAGE) 1000 MG tablet   ramipril (ALTACE) 5 MG capsule   rOPINIRole (REQUIP) 4 MG tablet   gabapentin (NEURONTIN) 600 MG tablet   pregabalin (LYRICA) 300 MG capsule   Other Relevant Orders  Urine microalbumin-creatinine with uACR   Renal function panel with eGFR   POCT glucose (manual entry) (Completed)   POCT glycosylated hemoglobin (Hb A1C) (Completed)   Type 2 diabetes mellitus with hyperglycemia, with long-term current use of insulin ([place])     We will get CGM monitor for the patient and he is working toward insulin pump has follow-up with endocrinology  A1c down to 7.9 this visit  The following Lifestyle Medicine recommendations according to [place] of Lifestyle Medicine [facility]) were discussed and offered to patient who agrees to start the journey:  A. Whole Foods, Plant-based plate comprising of fruits and vegetables, plant-based proteins, whole-grain carbohydrates was discussed in detail with the patient.   A list for source of those nutrients were also provided to the patient.  Patient will use only water or unsweetened tea for hydration. B.  The need to stay away from risky substances including alcohol, smoking; obtaining 7 to 9 hours of restorative sleep, at least 150 minutes of moderate intensity exercise weekly, the importance of healthy social connections,  and stress reduction techniques were discussed. C.  A full color page of  Calorie density of various food groups per pound showing examples of each food groups was provided to the patient.       Relevant Medications   atorvastatin (LIPITOR) 80 MG tablet   dapagliflozin propanediol (FARXIGA) 10 MG TABS tablet   insulin aspart (NOVOLOG FLEXPEN) 100 UNIT/ML FlexPen   insulin glargine (LANTUS SOLOSTAR) 100 UNIT/ML Solostar Pen   metFORMIN (GLUCOPHAGE) 1000 MG tablet   ramipril (ALTACE) 5 MG capsule     Other   Dyslipidemia, goal LDL below 70    Continue with current statin therapy      Relevant Medications   atorvastatin (LIPITOR) 80 MG tablet   metoprolol succinate (TOPROL-XL) 100 MG 24 hr tablet   nitroGLYCERIN (NITRODUR - DOSED IN MG/24 HR) 0.4 mg/hr patch   ramipril (ALTACE) 5 MG capsule   Restless leg syndrome    Continue with ropinirole      Meds ordered this encounter  Medications   Continuous Blood Gluc Transmit (DEXCOM G6 TRANSMITTER) MISC    Sig: Use as directed    Dispense:  1 each    Refill:  0   Continuous Blood Gluc Sensor (DEXCOM G6  SENSOR) MISC    Sig: Use to monitor blood sugar    Dispense:  3 each    Refill:  6   Continuous Blood Gluc Receiver (DEXCOM G6 RECEIVER) DEVI    Sig: Use to monitor blood sugar    Dispense:  1 each    Refill:  0   atorvastatin (LIPITOR) 80 MG tablet    Sig: Take 1 tablet (80 mg total) by mouth daily.    Dispense:  90 tablet    Refill:  3   dapagliflozin propanediol (FARXIGA) 10 MG TABS tablet    Sig: Take 1 tablet (10 mg total) by mouth daily.    Dispense:  90 tablet    Refill:  3   insulin aspart (NOVOLOG FLEXPEN) 100 UNIT/ML FlexPen    Sig: Max daily 102 units    Dispense:  90 mL    Refill:  3   insulin glargine (LANTUS SOLOSTAR) 100 UNIT/ML Solostar Pen    Sig: Inject 34 Units into the skin daily.    Dispense:  45 mL    Refill:  3   Insulin Pen Needle 30G X 5 MM MISC    Sig: 1 Device  by Does not apply route in the morning, at noon, in the evening, and at bedtime.    Dispense:  400 each    Refill:  3   metFORMIN (GLUCOPHAGE) 1000 MG tablet    Sig: Take 1 tablet (1,000 mg total) by mouth 2 (two) times daily with a meal.    Dispense:  180 tablet    Refill:  3   metoprolol succinate (TOPROL-XL) 100 MG 24 hr tablet    Sig: Take 1 tablet (100 mg total) by mouth daily. Take with or immediately following a meal.    Dispense:  180 tablet    Refill:  3   nitroGLYCERIN (NITRODUR - DOSED IN MG/24 HR) 0.4 mg/hr patch    Sig: Place 1 patch (0.4 mg total) onto the skin daily.    Dispense:  60 patch    Refill:  2    Pt out of town and left [facility] in [place]   pantoprazole (PROTONIX) 40 MG tablet    Sig: Take 1 tablet (40 mg total) by mouth daily.    Dispense:  90 tablet    Refill:  3   ramipril (ALTACE) 5 MG capsule    Sig: TAKE 2 CAPSULES (10 MG TOTAL) BY MOUTH DAILY.    Dispense:  180 capsule    Refill:  3   rOPINIRole (REQUIP) 4 MG tablet    Sig: TAKE 1 TABLET BY MOUTH EVERYDAY AT BEDTIME    Dispense:  30 tablet    Refill:  2   gabapentin (NEURONTIN) 600 MG tablet    Sig: Take 6  tablets (3,600 mg total) by mouth at bedtime.    Dispense:  180 tablet    Refill:  2   pregabalin (LYRICA) 300 MG capsule    Sig: Take 1 capsule (300 mg total) by mouth 2 (two) times daily.    Dispense:  60 capsule    Refill:  2   38 minutes spent including patient education complex decision making and filling out the paperwork Follow-up: Return in about 5 months (around 07/25/2022).  For face-to-face exam but will be seen by virtual visit in 3 months   Asencion Noble, MD

## 2022-02-22 NOTE — Assessment & Plan Note (Signed)
Continue with ropinirole

## 2022-02-22 NOTE — Patient Instructions (Addendum)
Please work on getting her new CPAP mask and wearing her CPAP nightly  Keep your appointments with cardiology endocrinology and the foot doctor  No change in medications  We will process your CGM monitor order  Great job on your A1c hope this will improve even more when she gets her insulin pump  Return to see Dr. Delford Field 5 months, we will also schedule a virtual visit in 3-4 months which will be a phone visit  All of your refills sent to your new [facility] pharmacy

## 2022-02-22 NOTE — Assessment & Plan Note (Signed)
Continue with current statin therapy 

## 2022-02-22 NOTE — Telephone Encounter (Signed)
Copied from CRM [id number]. Topic: General - Inquiry >> Feb 22, 2022 10:06 AM Marlow Baars wrote: Reason for CRM: Austin Vaughan with [facility] called regarding a stool culture that was cancelled for the patient. He wants to know if the provider wants him to call the patient back and do a stat culture or what? Please assist further

## 2022-02-22 NOTE — Assessment & Plan Note (Signed)
Patient now has CPAP machine setting of 15 cm water pressure

## 2022-02-22 NOTE — Assessment & Plan Note (Signed)
Hypertension well controlled at this time  Plan will be for the patient have refill sent to his new pharmacy at his new location in [facility]

## 2022-02-22 NOTE — Assessment & Plan Note (Addendum)
We will get CGM monitor for the patient and he is working toward insulin pump has follow-up with endocrinology  A1c down to 7.9 this visit  The following Lifestyle Medicine recommendations according to American College of Lifestyle Medicine [facility]) were discussed and offered to patient who agrees to start the journey:  A. Whole Foods, Plant-based plate comprising of fruits and vegetables, plant-based proteins, whole-grain carbohydrates was discussed in detail with the patient.   A list for source of those nutrients were also provided to the patient.  Patient will use only water or unsweetened tea for hydration. B.  The need to stay away from risky substances including alcohol, smoking; obtaining 7 to 9 hours of restorative sleep, at least 150 minutes of moderate intensity exercise weekly, the importance of healthy social connections,  and stress reduction techniques were discussed. C.  A full color page of  Calorie density of various food groups per pound showing examples of each food groups was provided to the patient.

## 2022-02-22 NOTE — Assessment & Plan Note (Signed)
Stable moderate persistent asthma no change in inhaled medicines

## 2022-02-23 LAB — RENAL FUNCTION PANEL
Albumin: 4.7 g/dL (ref 3.8–4.9)
BUN/Creatinine Ratio: 16 (ref 9–20)
BUN: 12 mg/dL (ref 6–24)
CO2: 24 mmol/L (ref 20–29)
Calcium: 9.6 mg/dL (ref 8.7–10.2)
Chloride: 101 mmol/L (ref 96–106)
Creatinine, Ser: 0.73 mg/dL — ABNORMAL LOW (ref 0.76–1.27)
Glucose: 192 mg/dL — ABNORMAL HIGH (ref 70–99)
Phosphorus: 3.4 mg/dL (ref 2.8–4.1)
Potassium: 4.2 mmol/L (ref 3.5–5.2)
Sodium: 140 mmol/L (ref 134–144)
eGFR: 109 mL/min/{1.73_m2} (ref >59)

## 2022-02-23 LAB — MICROALBUMIN / CREATININE URINE RATIO
Creatinine, Urine: 64.6 mg/dL
Microalb/Creat Ratio: 8 mg/g creat (ref 0–29)
Microalbumin, Urine: 5.2 ug/mL

## 2022-02-24 NOTE — Telephone Encounter (Signed)
Provider wanted results and is aware of cancel

## 2022-02-28 NOTE — Progress Notes (Signed)
Subjective:  Patient ID: Austin Vaughan, male    DOB: 03-17-1971,  MRN: [id number]  Chief Complaint  Patient presents with   Nail Problem    51 y.o. male presents for wound care. Patient presents with complaint of left hallux IPJ ulceration Faille exposed.  Patient has been present for quite some time.  Dr. Donzetta Kohut has seen the patient.  They have been keeping it covered with some Neosporin and triple antibiotic.  He wanted get evaluated denies any other acute complaints.  He is a diabetic with last A1c of 7.9   Review of Systems: Negative except as noted in the HPI. Denies N/V/F/Ch.  Past Medical History:  Diagnosis Date   Coronary artery disease    Diabetes mellitus without complication (HCC)    Diabetic foot ulcer (HCC) 08/23/2019   GERD (gastroesophageal reflux disease) 06/21/2021   Hypertension     Current Outpatient Medications:    albuterol (VENTOLIN HFA) 108 (90 Base) MCG/ACT inhaler, TAKE 2 PUFFS BY MOUTH EVERY 6 HOURS AS NEEDED FOR WHEEZE OR SHORTNESS OF BREATH, Disp: 8.5 each, Rfl: 11   aspirin EC 81 MG tablet, Take 81 mg by mouth daily., Disp: , Rfl:    atorvastatin (LIPITOR) 80 MG tablet, Take 1 tablet (80 mg total) by mouth daily., Disp: 90 tablet, Rfl: 3   cetirizine (ZYRTEC) 10 MG tablet, Take 1 tablet (10 mg total) by mouth daily., Disp: 30 tablet, Rfl: 11   Continuous Blood Gluc Receiver (DEXCOM G6 RECEIVER) DEVI, Use to monitor blood sugar, Disp: 1 each, Rfl: 0   Continuous Blood Gluc Sensor (DEXCOM G6 SENSOR) MISC, Use to monitor blood sugar, Disp: 3 each, Rfl: 6   Continuous Blood Gluc Transmit (DEXCOM G6 TRANSMITTER) MISC, Use as directed, Disp: 1 each, Rfl: 0   dapagliflozin propanediol (FARXIGA) 10 MG TABS tablet, Take 1 tablet (10 mg total) by mouth daily., Disp: 90 tablet, Rfl: 3   dicyclomine (BENTYL) 20 MG tablet, TAKE 1 TABLET (20 MG TOTAL) BY MOUTH 4 (FOUR) TIMES DAILY AS NEEDED (ABDOMINAL CRAMPING)., Disp: 360 tablet, Rfl: 1   EPINEPHrine (EPIPEN JR) 0.15  MG/0.3ML injection, Inject 0.15 mg into the muscle as needed for anaphylaxis., Disp: 2 each, Rfl: 2   gabapentin (NEURONTIN) 600 MG tablet, Take 6 tablets (3,600 mg total) by mouth at bedtime., Disp: 180 tablet, Rfl: 2   glucose blood test strip, 4x daily, Disp: 150 each, Rfl: 12   hydrOXYzine (VISTARIL) 25 MG capsule, Take 1 capsule (25 mg total) by mouth every 8 (eight) hours as needed for itching., Disp: 60 capsule, Rfl: 0   insulin aspart (NOVOLOG FLEXPEN) 100 UNIT/ML FlexPen, Max daily 102 units, Disp: 90 mL, Rfl: 3   insulin glargine (LANTUS SOLOSTAR) 100 UNIT/ML Solostar Pen, Inject 34 Units into the skin daily., Disp: 45 mL, Rfl: 3   Insulin Pen Needle 30G X 5 MM MISC, 1 Device by Does not apply route in the morning, at noon, in the evening, and at bedtime., Disp: 400 each, Rfl: 3   metFORMIN (GLUCOPHAGE) 1000 MG tablet, Take 1 tablet (1,000 mg total) by mouth 2 (two) times daily with a meal., Disp: 180 tablet, Rfl: 3   metoprolol succinate (TOPROL-XL) 100 MG 24 hr tablet, Take 1 tablet (100 mg total) by mouth daily. Take with or immediately following a meal., Disp: 180 tablet, Rfl: 3   montelukast (SINGULAIR) 10 MG tablet, Take 1 tablet (10 mg total) by mouth daily., Disp: 90 tablet, Rfl: 2   nitroGLYCERIN (NITRODUR -  DOSED IN MG/24 HR) 0.4 mg/hr patch, Place 1 patch (0.4 mg total) onto the skin daily., Disp: 60 patch, Rfl: 2   nitroGLYCERIN (NITROSTAT) 0.4 MG SL tablet, PLACE 1 TABLET UNDER THE TONGUE EVERY 5 MINUTES AS NEEDED FOR CHEST PAIN., Disp: 25 tablet, Rfl: 3   nortriptyline (PAMELOR) 50 MG capsule, TAKE 1 CAPSULE BY MOUTH EVERYDAY AT BEDTIME, Disp: 90 capsule, Rfl: 1   pantoprazole (PROTONIX) 40 MG tablet, Take 1 tablet (40 mg total) by mouth daily., Disp: 90 tablet, Rfl: 3   pregabalin (LYRICA) 300 MG capsule, Take 1 capsule (300 mg total) by mouth 2 (two) times daily., Disp: 60 capsule, Rfl: 2   ramipril (ALTACE) 5 MG capsule, TAKE 2 CAPSULES (10 MG TOTAL) BY MOUTH DAILY., Disp:  180 capsule, Rfl: 3   rOPINIRole (REQUIP) 4 MG tablet, TAKE 1 TABLET BY MOUTH EVERYDAY AT BEDTIME, Disp: 30 tablet, Rfl: 2  Social History   Tobacco Use  Smoking Status Never  Smokeless Tobacco Never    Allergies  Allergen Reactions   Ambien [Zolpidem Tartrate] Other (See Comments)    HALLUCINATIONS   Shellfish Allergy Anaphylaxis    Seafood   Objective:  There were no vitals filed for this visit. There is no height or weight on file to calculate BMI. Constitutional Well developed. Well nourished.  Vascular Dorsalis pedis pulses palpable bilaterally. Posterior tibial pulses palpable bilaterally. Capillary refill normal to all digits.  No cyanosis or clubbing noted. Pedal hair growth normal.  Neurologic Normal speech. Oriented to person, place, and time. Protective sensation absent  Dermatologic Wound Location: Left hallux IPJ ulceration with fat layer exposed.  No erythema noted no purulent drainage noted does not probe down to bone. Wound Base: Mixed Granular/Fibrotic Peri-wound: Calloused Exudate: Scant/small amount Serosanguinous exudate Wound Measurements: -See below  Orthopedic: No pain to palpation either foot.   Radiographs: None Assessment:   1. Ulcer of great toe, left, limited to breakdown of skin (HCC)   2. Type 2 diabetes mellitus with diabetic polyneuropathy, with long-term current use of insulin (HCC)    Plan:  Patient was evaluated and treated and all questions answered.  Ulcer left hallux IPJ ulceration with fat layer exposed -Debridement as below. -Dressed with Betadine wet-to-dry, DSD. -Continue off-loading with surgical shoe.  Procedure: Excisional Debridement of Wound Tool: Sharp chisel blade/tissue nipper Rationale: Removal of non-viable soft tissue from the wound to promote healing.  Anesthesia: none Pre-Debridement Wound Measurements: 0.7 cm x 0.7 cm x 0.3 cm  Post-Debridement Wound Measurements: 0.9 cm x 0.7 cm x 0.3 cm  Type of  Debridement: Sharp Excisional Tissue Removed: Non-viable soft tissue Blood loss: Minimal (<50cc) Depth of Debridement: subcutaneous tissue. Technique: Sharp excisional debridement to bleeding, viable wound base.  Wound Progress: This is my initial evaluation of continue to monitor the progression of the wound Site healing conversation 7 Dressing: Dry, sterile, compression dressing. Disposition: Patient tolerated procedure well. Patient to return in 1 week for follow-up.  No follow-ups on file.

## 2022-03-01 NOTE — Addendum Note (Signed)
Addended by: Storm Frisk on: 03/01/2022 05:38 AM   Modules accepted: Orders

## 2022-03-02 NOTE — Addendum Note (Signed)
Addended by: Shan Levans E on: 03/02/2022 12:16 PM   Modules accepted: Orders

## 2022-03-03 ENCOUNTER — Encounter: Payer: Self-pay | Admitting: Podiatry

## 2022-03-09 ENCOUNTER — Inpatient Hospital Stay (HOSPITAL_COMMUNITY)
Admission: EM | Admit: 2022-03-09 | Discharge: 2022-03-12 | DRG: 617 | Disposition: A | Discharge status: Home / Self Care | Payer: Medicare Other | Attending: Family Medicine | Admitting: Family Medicine

## 2022-03-09 ENCOUNTER — Telehealth: Payer: Self-pay | Admitting: Podiatry

## 2022-03-09 ENCOUNTER — Observation Stay (HOSPITAL_COMMUNITY): Payer: Medicare Other

## 2022-03-09 ENCOUNTER — Observation Stay (HOSPITAL_BASED_OUTPATIENT_CLINIC_OR_DEPARTMENT_OTHER): Payer: Medicare Other

## 2022-03-09 ENCOUNTER — Emergency Department (HOSPITAL_COMMUNITY): Payer: Medicare Other

## 2022-03-09 ENCOUNTER — Other Ambulatory Visit: Payer: Self-pay

## 2022-03-09 DIAGNOSIS — E11628 Type 2 diabetes mellitus with other skin complications: Secondary | ICD-10-CM | POA: Diagnosis not present

## 2022-03-09 DIAGNOSIS — E1142 Type 2 diabetes mellitus with diabetic polyneuropathy: Secondary | ICD-10-CM | POA: Diagnosis not present

## 2022-03-09 DIAGNOSIS — E11621 Type 2 diabetes mellitus with foot ulcer: Principal | ICD-10-CM | POA: Diagnosis present

## 2022-03-09 DIAGNOSIS — Z7984 Long term (current) use of oral hypoglycemic drugs: Secondary | ICD-10-CM

## 2022-03-09 DIAGNOSIS — I5042 Chronic combined systolic (congestive) and diastolic (congestive) heart failure: Secondary | ICD-10-CM | POA: Diagnosis present

## 2022-03-09 DIAGNOSIS — E1152 Type 2 diabetes mellitus with diabetic peripheral angiopathy with gangrene: Secondary | ICD-10-CM | POA: Diagnosis present

## 2022-03-09 DIAGNOSIS — Z7982 Long term (current) use of aspirin: Secondary | ICD-10-CM

## 2022-03-09 DIAGNOSIS — L97509 Non-pressure chronic ulcer of other part of unspecified foot with unspecified severity: Secondary | ICD-10-CM | POA: Diagnosis not present

## 2022-03-09 DIAGNOSIS — G2581 Restless legs syndrome: Secondary | ICD-10-CM | POA: Diagnosis present

## 2022-03-09 DIAGNOSIS — Z91013 Allergy to seafood: Secondary | ICD-10-CM

## 2022-03-09 DIAGNOSIS — E872 Acidosis, unspecified: Secondary | ICD-10-CM | POA: Diagnosis present

## 2022-03-09 DIAGNOSIS — E1165 Type 2 diabetes mellitus with hyperglycemia: Secondary | ICD-10-CM | POA: Diagnosis not present

## 2022-03-09 DIAGNOSIS — L0390 Cellulitis, unspecified: Secondary | ICD-10-CM

## 2022-03-09 DIAGNOSIS — Z794 Long term (current) use of insulin: Secondary | ICD-10-CM | POA: Diagnosis not present

## 2022-03-09 DIAGNOSIS — L089 Local infection of the skin and subcutaneous tissue, unspecified: Secondary | ICD-10-CM

## 2022-03-09 DIAGNOSIS — Z8249 Family history of ischemic heart disease and other diseases of the circulatory system: Secondary | ICD-10-CM

## 2022-03-09 DIAGNOSIS — I110 Hypertensive heart disease with heart failure: Secondary | ICD-10-CM | POA: Diagnosis present

## 2022-03-09 DIAGNOSIS — L97523 Non-pressure chronic ulcer of other part of left foot with necrosis of muscle: Secondary | ICD-10-CM | POA: Diagnosis not present

## 2022-03-09 DIAGNOSIS — L03032 Cellulitis of left toe: Secondary | ICD-10-CM | POA: Diagnosis present

## 2022-03-09 DIAGNOSIS — L97529 Non-pressure chronic ulcer of other part of left foot with unspecified severity: Secondary | ICD-10-CM | POA: Diagnosis not present

## 2022-03-09 DIAGNOSIS — I252 Old myocardial infarction: Secondary | ICD-10-CM

## 2022-03-09 DIAGNOSIS — Z955 Presence of coronary angioplasty implant and graft: Secondary | ICD-10-CM

## 2022-03-09 DIAGNOSIS — K219 Gastro-esophageal reflux disease without esophagitis: Secondary | ICD-10-CM | POA: Diagnosis present

## 2022-03-09 DIAGNOSIS — M868X8 Other osteomyelitis, other site: Secondary | ICD-10-CM | POA: Diagnosis present

## 2022-03-09 DIAGNOSIS — L97521 Non-pressure chronic ulcer of other part of left foot limited to breakdown of skin: Secondary | ICD-10-CM | POA: Diagnosis present

## 2022-03-09 DIAGNOSIS — I96 Gangrene, not elsewhere classified: Secondary | ICD-10-CM

## 2022-03-09 DIAGNOSIS — I739 Peripheral vascular disease, unspecified: Secondary | ICD-10-CM | POA: Diagnosis not present

## 2022-03-09 DIAGNOSIS — J302 Other seasonal allergic rhinitis: Secondary | ICD-10-CM | POA: Diagnosis present

## 2022-03-09 DIAGNOSIS — I2510 Atherosclerotic heart disease of native coronary artery without angina pectoris: Secondary | ICD-10-CM | POA: Diagnosis present

## 2022-03-09 DIAGNOSIS — M869 Osteomyelitis, unspecified: Secondary | ICD-10-CM | POA: Diagnosis not present

## 2022-03-09 DIAGNOSIS — Z888 Allergy status to other drugs, medicaments and biological substances status: Secondary | ICD-10-CM

## 2022-03-09 DIAGNOSIS — G4730 Sleep apnea, unspecified: Secondary | ICD-10-CM | POA: Diagnosis present

## 2022-03-09 DIAGNOSIS — I10 Essential (primary) hypertension: Secondary | ICD-10-CM | POA: Diagnosis present

## 2022-03-09 DIAGNOSIS — Z79899 Other long term (current) drug therapy: Secondary | ICD-10-CM

## 2022-03-09 HISTORY — DX: Sleep apnea, unspecified: G47.30

## 2022-03-09 LAB — LACTIC ACID, PLASMA
Lactic Acid, Venous: 2 mmol/L (ref 0.5–1.9)
Lactic Acid, Venous: 1.8 mmol/L (ref 0.5–1.9)

## 2022-03-09 LAB — COMPREHENSIVE METABOLIC PANEL
ALT: 18 U/L (ref 0–44)
AST: 13 U/L — ABNORMAL LOW (ref 15–41)
Albumin: 4.3 g/dL (ref 3.5–5.0)
Alkaline Phosphatase: 125 U/L (ref 38–126)
Anion gap: 11 (ref 5–15)
BUN: 13 mg/dL (ref 6–20)
CO2: 24 mmol/L (ref 22–32)
Calcium: 9.4 mg/dL (ref 8.9–10.3)
Chloride: 102 mmol/L (ref 98–111)
Creatinine, Ser: 0.94 mg/dL (ref 0.61–1.24)
GFR, Estimated: >60 mL/min (ref >60)
Glucose, Bld: 190 mg/dL — ABNORMAL HIGH (ref 70–99)
Potassium: 3.8 mmol/L (ref 3.5–5.1)
Sodium: 137 mmol/L (ref 135–145)
Total Bilirubin: 1.2 mg/dL (ref 0.3–1.2)
Total Protein: 8.1 g/dL (ref 6.5–8.1)

## 2022-03-09 LAB — CBC WITH DIFFERENTIAL/PLATELET
Abs Immature Granulocytes: 0.04 10*3/uL (ref 0.00–0.07)
Basophils Absolute: 0 10*3/uL (ref 0.0–0.1)
Basophils Relative: 0 %
Eosinophils Absolute: 0.1 10*3/uL (ref 0.0–0.5)
Eosinophils Relative: 1 %
HCT: 45.1 % (ref 39.0–52.0)
Hemoglobin: 14.2 g/dL (ref 13.0–17.0)
Immature Granulocytes: 0 %
Lymphocytes Relative: 18 %
Lymphs Abs: 1.9 10*3/uL (ref 0.7–4.0)
MCH: 27.4 pg (ref 26.0–34.0)
MCHC: 31.5 g/dL (ref 30.0–36.0)
MCV: 86.9 fL (ref 80.0–100.0)
Monocytes Absolute: 0.8 10*3/uL (ref 0.1–1.0)
Monocytes Relative: 8 %
Neutro Abs: 7.4 10*3/uL (ref 1.7–7.7)
Neutrophils Relative %: 73 %
Platelets: 324 10*3/uL (ref 150–400)
RBC: 5.19 MIL/uL (ref 4.22–5.81)
RDW: 12.9 % (ref 11.5–15.5)
WBC: 10.3 10*3/uL (ref 4.0–10.5)
nRBC: 0 % (ref 0.0–0.2)

## 2022-03-09 LAB — GLUCOSE, CAPILLARY
Glucose-Capillary: 143 mg/dL — ABNORMAL HIGH (ref 70–99)
Glucose-Capillary: 150 mg/dL — ABNORMAL HIGH (ref 70–99)

## 2022-03-09 LAB — HIV ANTIBODY (ROUTINE TESTING W REFLEX): HIV Screen 4th Generation wRfx: NONREACTIVE

## 2022-03-09 MED ORDER — ENOXAPARIN SODIUM 40 MG/0.4ML IJ SOSY: 40.0000 mg | PREFILLED_SYRINGE | INTRAMUSCULAR | Status: DC

## 2022-03-09 MED ORDER — METRONIDAZOLE 500 MG/100ML IV SOLN
500.0000 mg | Freq: Two times a day (BID) | INTRAVENOUS | Status: DC
  Administered 2022-03-09 – 2022-03-12 (×7): 500 mg via INTRAVENOUS
  Filled 2022-03-09 (×7): qty 100

## 2022-03-09 MED ORDER — ATORVASTATIN CALCIUM 80 MG PO TABS
80.0000 mg | ORAL_TABLET | Freq: Every day | ORAL | Status: DC
  Administered 2022-03-09 – 2022-03-12 (×4): 80 mg via ORAL
  Filled 2022-03-09: qty 2
  Filled 2022-03-09 (×3): qty 1

## 2022-03-09 MED ORDER — GABAPENTIN 400 MG PO CAPS
800.0000 mg | ORAL_CAPSULE | Freq: Three times a day (TID) | ORAL | Status: DC
  Administered 2022-03-10: 800 mg via ORAL
  Filled 2022-03-09: qty 2

## 2022-03-09 MED ORDER — GABAPENTIN 600 MG PO TABS
300.0000 mg | ORAL_TABLET | Freq: Once | ORAL | Status: AC
  Administered 2022-03-10: 300 mg via ORAL
  Filled 2022-03-09: qty 1

## 2022-03-09 MED ORDER — ACETAMINOPHEN 650 MG RE SUPP
650.0000 mg | Freq: Four times a day (QID) | RECTAL | Status: DC | PRN
  Filled 2022-03-09: qty 1

## 2022-03-09 MED ORDER — VANCOMYCIN HCL 1250 MG/250ML IV SOLN
1250.0000 mg | Freq: Two times a day (BID) | INTRAVENOUS | Status: DC
Start: 2022-03-09 — End: 2022-03-09

## 2022-03-09 MED ORDER — DAPAGLIFLOZIN PROPANEDIOL 10 MG PO TABS
10.0000 mg | ORAL_TABLET | Freq: Every day | ORAL | Status: DC
  Administered 2022-03-09 – 2022-03-10 (×2): 10 mg via ORAL
  Filled 2022-03-09 (×3): qty 1

## 2022-03-09 MED ORDER — INSULIN GLARGINE-YFGN 100 UNIT/ML ~~LOC~~ SOLN
20.0000 [IU] | Freq: Every day | SUBCUTANEOUS | Status: DC
Start: 2022-03-09 — End: 2022-03-10
  Administered 2022-03-09: 20 [IU] via SUBCUTANEOUS
  Filled 2022-03-09 (×2): qty 0.2

## 2022-03-09 MED ORDER — LORATADINE 10 MG PO TABS
10.0000 mg | ORAL_TABLET | Freq: Every day | ORAL | Status: DC
  Administered 2022-03-09 – 2022-03-12 (×4): 10 mg via ORAL
  Filled 2022-03-09 (×4): qty 1

## 2022-03-09 MED ORDER — VANCOMYCIN HCL 1250 MG/250ML IV SOLN
1250.0000 mg | Freq: Two times a day (BID) | INTRAVENOUS | Status: DC
  Administered 2022-03-10 – 2022-03-11 (×5): 1250 mg via INTRAVENOUS
  Filled 2022-03-09 (×6): qty 250

## 2022-03-09 MED ORDER — METOPROLOL SUCCINATE ER 100 MG PO TB24
100.0000 mg | ORAL_TABLET | Freq: Every day | ORAL | Status: DC
  Administered 2022-03-09 – 2022-03-12 (×4): 100 mg via ORAL
  Filled 2022-03-09: qty 4
  Filled 2022-03-09 (×3): qty 1

## 2022-03-09 MED ORDER — PREGABALIN 100 MG PO CAPS
300.0000 mg | ORAL_CAPSULE | Freq: Every day | ORAL | Status: DC
  Administered 2022-03-10 – 2022-03-11 (×3): 300 mg via ORAL
  Filled 2022-03-09 (×3): qty 3

## 2022-03-09 MED ORDER — NORTRIPTYLINE HCL 25 MG PO CAPS
50.0000 mg | ORAL_CAPSULE | Freq: Every day | ORAL | Status: DC
  Administered 2022-03-09 – 2022-03-12 (×3): 50 mg via ORAL
  Filled 2022-03-09 (×5): qty 2

## 2022-03-09 MED ORDER — VANCOMYCIN HCL 10 G IV SOLR
2500.0000 mg | Freq: Once | INTRAVENOUS | Status: AC
  Administered 2022-03-09: 2500 mg via INTRAVENOUS
  Filled 2022-03-09: qty 2500

## 2022-03-09 MED ORDER — ONDANSETRON HCL 4 MG/2ML IJ SOLN
4.0000 mg | Freq: Once | INTRAMUSCULAR | Status: AC
  Administered 2022-03-09: 4 mg via INTRAVENOUS
  Filled 2022-03-09: qty 2

## 2022-03-09 MED ORDER — ACETAMINOPHEN 325 MG PO TABS
650.0000 mg | ORAL_TABLET | Freq: Four times a day (QID) | ORAL | Status: DC | PRN
  Administered 2022-03-09 – 2022-03-11 (×3): 650 mg via ORAL
  Filled 2022-03-09 (×3): qty 2

## 2022-03-09 MED ORDER — GABAPENTIN 600 MG PO TABS
600.0000 mg | ORAL_TABLET | Freq: Three times a day (TID) | ORAL | Status: DC
  Administered 2022-03-09 (×2): 600 mg via ORAL
  Filled 2022-03-09 (×2): qty 1

## 2022-03-09 MED ORDER — ROPINIROLE HCL 1 MG PO TABS
4.0000 mg | ORAL_TABLET | Freq: Every day | ORAL | Status: DC
  Administered 2022-03-09 – 2022-03-12 (×3): 4 mg via ORAL
  Filled 2022-03-09: qty 8
  Filled 2022-03-09 (×4): qty 4

## 2022-03-09 MED ORDER — MORPHINE SULFATE (PF) 4 MG/ML IV SOLN
4.0000 mg | Freq: Once | INTRAVENOUS | Status: AC
  Administered 2022-03-09: 4 mg via INTRAVENOUS
  Filled 2022-03-09: qty 1

## 2022-03-09 MED ORDER — RAMIPRIL 5 MG PO CAPS
5.0000 mg | ORAL_CAPSULE | Freq: Every day | ORAL | Status: DC
  Administered 2022-03-09 – 2022-03-12 (×4): 5 mg via ORAL
  Filled 2022-03-09 (×4): qty 1

## 2022-03-09 MED ORDER — MONTELUKAST SODIUM 10 MG PO TABS
10.0000 mg | ORAL_TABLET | Freq: Every day | ORAL | Status: DC
Start: 2022-03-09 — End: 2022-03-13
  Administered 2022-03-09 – 2022-03-12 (×4): 10 mg via ORAL
  Filled 2022-03-09 (×4): qty 1

## 2022-03-09 MED ORDER — SODIUM CHLORIDE 0.9 % IV SOLN
2.0000 g | Freq: Three times a day (TID) | INTRAVENOUS | Status: DC
  Administered 2022-03-09 – 2022-03-12 (×9): 2 g via INTRAVENOUS
  Filled 2022-03-09 (×10): qty 12.5

## 2022-03-09 MED ORDER — PANTOPRAZOLE SODIUM 40 MG PO TBEC
40.0000 mg | DELAYED_RELEASE_TABLET | Freq: Every day | ORAL | Status: DC
  Administered 2022-03-09 – 2022-03-12 (×4): 40 mg via ORAL
  Filled 2022-03-09 (×4): qty 1

## 2022-03-09 MED ORDER — NITROGLYCERIN 0.4 MG/HR TD PT24
0.4000 mg | MEDICATED_PATCH | Freq: Every day | TRANSDERMAL | Status: DC
  Administered 2022-03-09 – 2022-03-12 (×3): 0.4 mg via TRANSDERMAL
  Filled 2022-03-09 (×4): qty 1

## 2022-03-09 NOTE — Progress Notes (Signed)
ABI/TBI study completed. Please see CV Proc for preliminary results.  Ashlynn Gunnels BS, RVT 03/09/2022 3:10 PM

## 2022-03-09 NOTE — Progress Notes (Signed)
Pharmacy Antibiotic Note  Austin Vaughan is a 51 y.o. male admitted on 03/09/2022 with  diabetic foot infection .  Pharmacy has been consulted for vancomycin and cefepime dosing.  Plan: Flagyl 500mg  IV Q12H Cefepime 2g IV Q8H Vancomycin 2.5g IV x 1; then 1250mg  IV q12h. Goal AUC 400-550. Expected AUC: 452 SCr used: 0.94   Monitor for changes in renal function and culture data for level assessment.    Temp (24hrs), Avg:98.4 F (36.9 C), Min:97.7 F (36.5 C), Max:99.1 F (37.3 C)  Recent Labs  Lab 03/09/22 0208  WBC 10.3  CREATININE 0.94  LATICACIDVEN 2.0*    CrCl cannot be calculated (Unknown ideal weight.).    Allergies  Allergen Reactions   Ambien [Zolpidem Tartrate] Other (See Comments)    HALLUCINATIONS   Shellfish Allergy Anaphylaxis    Seafood    Antimicrobials this admission: 7/27 Vancomycin >> 8/2 7/27 Cefepime >> 8/2 7/27 Flagyl >> 8/2  Dose adjustments this admission:  Microbiology results: 7/27 BCx: NGTD  Thank you for allowing pharmacy to be a part of this patient's care.  8/27, PharmD 03/09/2022 10:22 AM

## 2022-03-09 NOTE — Telephone Encounter (Signed)
Please advise 

## 2022-03-09 NOTE — Progress Notes (Addendum)
FMTS Brief Progress Note  S:Patient reports minimal intermittent pain through his left large toe. Otherwise, no complaints.  He says he would like his home gabapentin (6, 600mg  tablets), and lyrica (2, 150mg  tablets).  Denies pain in either leg at this moment, fever, chills.    O: BP 114/71   Pulse 73   Temp 98 F (36.7 C) (Oral)   Resp 16   SpO2 97%   General: Well appearing, laying in bed  CV: faint dorsal pedal pulses, warm extremities  Resp: Breathing comfortably on room air  Ext: Left large toe ulcerated with violacious and erythematous skin, erythematous and edematous above ankle 2+, warm to touch.    A/P: Neuropathy:  - Pt received 600 mg gabapentin already, ordered one time 300 mg for tonight and will start Gabapentin 800 mg TID tomorrow, Lyrica 300 mg qHS ordered starting tonight  - Orders reviewed. Labs for AM ordered, which was adjusted as needed.   , MD 03/09/2022, 10:34 PM PGY-1, [facility] Night Resident  Please page [phone] with questions.

## 2022-03-09 NOTE — ED Notes (Signed)
Breakfast tray ordered 

## 2022-03-09 NOTE — ED Provider Notes (Signed)
[facility] EMERGENCY DEPARTMENT Provider Note   CSN: [id number] Arrival date & time: 03/09/22  0034     History  Chief Complaint  Patient presents with   Wound Infection    Austin Vaughan is a 50 y.o. male.  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  Pain is located in his left big toe.  States that the pain started 3 months ago with a blister and has gotten progressively worse.  Advised to come to the ED by his podiatrist after evaluation yesterday.  Patient reports that the toe is more painful and skin breakdown is worsening.  Also reports swelling in that toe that is now migrated up to the mid calf.  Endorses loss of sensation in that toe.  Range of motion intact per patient.         Home Medications Prior to Admission medications   Medication Sig Start Date End Date Taking? Authorizing Provider  albuterol (VENTOLIN HFA) 108 (90 Base) MCG/ACT inhaler TAKE 2 PUFFS BY MOUTH EVERY 6 HOURS AS NEEDED FOR WHEEZE OR SHORTNESS OF BREATH Patient taking differently: Inhale 2 puffs into the lungs every 6 (six) hours as needed for wheezing or shortness of breath. 08/25/21  Yes Storm Frisk, MD  aspirin EC 81 MG tablet Take 81 mg by mouth daily.   Yes [provider]  atorvastatin (LIPITOR) 80 MG tablet Take 1 tablet (80 mg total) by mouth daily. 02/22/22 02/22/23 Yes Storm Frisk, MD  cetirizine (ZYRTEC) 10 MG tablet Take 1 tablet (10 mg total) by mouth daily. 08/23/21  Yes Storm Frisk, MD  dapagliflozin propanediol (FARXIGA) 10 MG TABS tablet Take 1 tablet (10 mg total) by mouth daily. 02/22/22  Yes Storm Frisk, MD  dicyclomine (BENTYL) 20 MG tablet TAKE 1 TABLET (20 MG TOTAL) BY MOUTH 4 (FOUR) TIMES DAILY AS NEEDED (ABDOMINAL CRAMPING). 12/19/21  Yes Storm Frisk, MD  EPINEPHrine (EPIPEN JR)  0.15 MG/0.3ML injection Inject 0.15 mg into the muscle as needed for anaphylaxis. 10/05/21  Yes Storm Frisk, MD  gabapentin (NEURONTIN) 600 MG tablet Take 6 tablets (3,600 mg total) by mouth at bedtime. 02/22/22 03/24/22 Yes Storm Frisk, MD  hydrOXYzine (VISTARIL) 25 MG capsule Take 1 capsule (25 mg total) by mouth every 8 (eight) hours as needed for itching. 08/23/21  Yes Storm Frisk, MD  insulin aspart (NOVOLOG FLEXPEN) 100 UNIT/ML FlexPen Max daily 102 units Patient taking differently: Inject 35 Units into the skin 3 (three) times daily with meals. 02/22/22  Yes Storm Frisk, MD  insulin glargine (LANTUS SOLOSTAR) 100 UNIT/ML Solostar Pen Inject 34 Units into the skin daily. Patient taking differently: Inject 40 Units into the skin at bedtime. 02/22/22  Yes Storm Frisk, MD  metFORMIN (GLUCOPHAGE) 1000 MG tablet Take 1 tablet (1,000 mg total) by mouth 2 (two) times daily with a meal. 02/22/22  Yes Storm Frisk, MD  metoprolol succinate (TOPROL-XL) 100 MG 24 hr tablet Take 1 tablet (100 mg total) by mouth daily. Take with or immediately following a meal. Patient taking differently: Take 100 mg by mouth daily. 02/22/22  Yes Storm Frisk, MD  montelukast (SINGULAIR) 10 MG tablet Take 1 tablet (10 mg total) by mouth daily. 01/30/22  Yes Storm Frisk, MD  nitroGLYCERIN (NITRODUR - DOSED IN MG/24 HR) 0.4 mg/hr  patch Place 1 patch (0.4 mg total) onto the skin daily. 02/22/22  Yes Storm Frisk, MD  nitroGLYCERIN (NITROSTAT) 0.4 MG SL tablet PLACE 1 TABLET UNDER THE TONGUE EVERY 5 MINUTES AS NEEDED FOR CHEST PAIN. Patient taking differently: Place 0.4 mg under the tongue every 5 (five) minutes as needed for chest pain. 08/23/21  Yes Storm Frisk, MD  nortriptyline (PAMELOR) 50 MG capsule TAKE 1 CAPSULE BY MOUTH EVERYDAY AT BEDTIME Patient taking differently: Take 50 mg by mouth at bedtime. 09/20/21  Yes Storm Frisk, MD  pantoprazole (PROTONIX) 40 MG tablet Take  1 tablet (40 mg total) by mouth daily. 02/22/22  Yes Storm Frisk, MD  pregabalin (LYRICA) 300 MG capsule Take 1 capsule (300 mg total) by mouth 2 (two) times daily. 02/22/22  Yes Storm Frisk, MD  ramipril (ALTACE) 5 MG capsule TAKE 2 CAPSULES (10 MG TOTAL) BY MOUTH DAILY. Patient taking differently: Take 10 mg by mouth daily. 02/22/22  Yes Storm Frisk, MD  rOPINIRole (REQUIP) 4 MG tablet TAKE 1 TABLET BY MOUTH EVERYDAY AT BEDTIME Patient taking differently: Take 4 mg by mouth at bedtime. 02/22/22  Yes Storm Frisk, MD      Allergies    Ambien [zolpidem tartrate] and Shellfish allergy    Review of Systems   Review of Systems  Physical Exam Updated Vital Signs BP 132/88   Pulse 85   Temp 97.7 F (36.5 C) (Oral)   Resp 15   SpO2 93%  Physical Exam Vitals and nursing note reviewed.  HENT:     Head: Normocephalic and atraumatic.     Mouth/Throat:     Mouth: Mucous membranes are moist.  Eyes:     General:        Right eye: No discharge.        Left eye: No discharge.     Conjunctiva/sclera: Conjunctivae normal.  Cardiovascular:     Rate and Rhythm: Normal rate and regular rhythm.     Pulses:          Radial pulses are 2+ on the right side and 2+ on the left side.       Dorsalis pedis pulses are 2+ on the right side and 1+ on the left side.     Heart sounds: Normal heart sounds.  Pulmonary:     Effort: Pulmonary effort is normal.     Breath sounds: Normal breath sounds.  Abdominal:     General: Abdomen is flat.     Palpations: Abdomen is soft.  Musculoskeletal:     Right lower leg: No edema.     Left lower leg: 1+ Edema present.  Feet:     Comments: Severe skin break down, bleeding, and oozing appreciated at the right great toe. No sensation elicited during exam. ROM intact.  Skin:    General: Skin is warm and dry.  Neurological:     General: No focal deficit present.  Psychiatric:        Mood and Affect: Mood normal.     ED Results / Procedures  / Treatments   Labs (all labs ordered are listed, but only abnormal results are displayed) Labs Reviewed  COMPREHENSIVE METABOLIC PANEL - Abnormal; Notable for the following components:      Result Value   Glucose, Bld 190 (*)    AST 13 (*)    All other components within normal limits  LACTIC ACID, PLASMA - Abnormal; Notable for the following components:  Lactic Acid, Venous 2.0 (*)    All other components within normal limits  CULTURE, BLOOD (ROUTINE X 2)  CULTURE, BLOOD (ROUTINE X 2)  CBC WITH DIFFERENTIAL/PLATELET  HIV ANTIBODY (ROUTINE TESTING W REFLEX)    EKG None  Radiology DG Foot Complete Left  Result Date: 03/09/2022 CLINICAL DATA:  Foot ulcer EXAM: LEFT FOOT - COMPLETE 3+ VIEW COMPARISON:  None Available. FINDINGS: No fracture or dislocation is seen. Soft tissue ulcer along the medial aspect of the 1st distal phalanx. No associated cortical destruction to suggest osteomyelitis. Mild degenerative changes of the 1st MTP joint. IMPRESSION: Soft tissue ulcer along the medial aspect of the 1st distal phalanx. No radiographic findings of osteomyelitis. Electronically Signed   By: Julian Hy M.D.   On: 03/09/2022 03:04    Procedures Procedures    Medications Ordered in ED Medications  metroNIDAZOLE (FLAGYL) IVPB 500 mg (500 mg Intravenous New Bag/Given 03/09/22 1007)  vancomycin (VANCOCIN) 2,500 mg in sodium chloride 0.9 % 500 mL IVPB (has no administration in time range)  ceFEPIme (MAXIPIME) 2 g in sodium chloride 0.9 % 100 mL IVPB (has no administration in time range)  vancomycin (VANCOREADY) IVPB 1250 mg/250 mL (has no administration in time range)  atorvastatin (LIPITOR) tablet 80 mg (has no administration in time range)  metoprolol succinate (TOPROL-XL) 24 hr tablet 100 mg (has no administration in time range)  nitroGLYCERIN (NITRODUR - Dosed in mg/24 hr) patch 0.4 mg (has no administration in time range)  ramipril (ALTACE) capsule 5 mg (has no administration in  time range)  nortriptyline (PAMELOR) capsule 50 mg (has no administration in time range)  dapagliflozin propanediol (FARXIGA) tablet 10 mg (has no administration in time range)  pantoprazole (PROTONIX) EC tablet 40 mg (has no administration in time range)  rOPINIRole (REQUIP) tablet 4 mg (has no administration in time range)  loratadine (CLARITIN) tablet 10 mg (has no administration in time range)  montelukast (SINGULAIR) tablet 10 mg (has no administration in time range)  enoxaparin (LOVENOX) injection 40 mg (has no administration in time range)  acetaminophen (TYLENOL) tablet 650 mg (has no administration in time range)    Or  acetaminophen (TYLENOL) suppository 650 mg (has no administration in time range)  insulin glargine-yfgn (SEMGLEE) injection 20 Units (has no administration in time range)  morphine (PF) 4 MG/ML injection 4 mg (4 mg Intravenous Given 03/09/22 0805)  ondansetron (ZOFRAN) injection 4 mg (4 mg Intravenous Given 03/09/22 0805)    ED Course/ Medical Decision Making/ A&P                           Medical Decision Making This patient presents to the ED for concern of toe pain, this involves a number of treatment options, and is a complaint that carries with it a moderate risk of complications and morbidity.  The differential diagnosis includes osteomyelitis, necrotizing fasciitis, and poor wound healing associated with diabetes.   Co morbidities: Discussed in HPI   EMR reviewed including pt PMHx, past surgical history and past visits to ER.   See HPI for more details   Lab Tests:   I ordered and independently interpreted labs. Labs notable for hyperglycemia and mild lactic acidosis.  Lactic acidosis could be related to underlying infection stated with that left toe.   Imaging Studies:  NAD. I personally reviewed all imaging studies and no acute abnormality found. I agree with radiology interpretation.    Medicines ordered:  I ordered  medication including  vanc/cefepime/flagyl for for likely underlying of the left great toe. Ordered dilaudid for pain.  Reevaluation of the patient after these medicines showed that the patient improved I have reviewed the patients home medicines and have made adjustments as needed   Consults/Attending Physician        I requested consultation with podiatry, and discussed lab and imaging findings as well as pertinent plan - Dr. Sherryle Lis: recommend MRI of the foot, vanc/cefepime/flagyl, and medicine admission and he will see patient in consult - I requested consultation with the medicine team for admission. Case discuss with family medicine teach service who will see and admit the patient.    Reevaluation:  After the interventions noted above I re-evaluated patient and found that they have :improved  ED course:  The presentation of the left great toe is concerning for poor wound healing likely associated with h/o diabetes and cannot r/o underlying infection. Xray of that foot did not reveal concern for osteomylitis but given the presentation further work up was warranted. Ordered vanc/cefepime/flagyl and MRI per podiatry recs and admitted to the hospital for ongoing management. Ordered dilaudid for pain.   Dispostion:  After consideration of the diagnostic results and the patients response to treatment, I feel that the patent would benefit from admission to hospital for continued IV antibiotic treatment of his right great toe.    Risk Prescription drug management.           Final Clinical Impression(s) / ED Diagnoses Final diagnoses:  Wound infection    Rx / DC Orders ED Discharge Orders     None         Harriet Pho, PA-C 03/09/22 1132    Lucrezia Starch, MD 03/10/22 [phone]

## 2022-03-09 NOTE — ED Notes (Signed)
Taken to MRI by transporter.  °

## 2022-03-09 NOTE — ED Triage Notes (Signed)
Pt has been managing an foot ulcer to the left big toe for the past month. Has been doing daily wound changes, PO antibiotics, and seeing Dr.Patel, podiatrist for management of ulcer. Wound has worsen and presents to ED for further evaluation. On removal of dressing toe is discolored, minimal bleeding, without foul smell. HX DM and peripheral neuropathy. Denies fevers/chills.

## 2022-03-09 NOTE — Assessment & Plan Note (Addendum)
CBGs 100s. A1c 7.9 this month, down from 9.3 just 6 months ago. Insulin regimen includes sliding scale with meals, lantus 40 units at night. Takes Metformin 1000mg  BID. Takes Gabapentin 3600 mg at night, Lyrica 200mg  BID for diabetic neuropathy - Monitor CBGs - Sensitive sliding scale insulin with 20u semglee - Semglee 20 units at night -- Continue gabapentin, lyrica

## 2022-03-09 NOTE — ED Provider Triage Note (Signed)
Emergency Medicine Provider Triage Evaluation Note  Austin Vaughan , a 51 y.o. male  was evaluated in triage.  Pt with history of CAD, type 2 diabetes, and left great toe wound for which she follows with podiatry presents with concern for worsening discoloration and skin sloughing the wound.  Was directed to the ED by Dr. Allena Katz, podiatrist for IV antibiotics per patient.  Pain 7/10, declined analgesia when offered by this provider. Review of Systems  Positive: Left great toe wound, pain extending up to the distal lower leg Negative: Fevers, chills, nausea, vomiting  Physical Exam  BP (!) 133/95 (BP Location: Right Arm)   Pulse 83   Temp 99.1 F (37.3 C) (Oral)   Resp 17   SpO2 98%  Gen:   Awake, no distress   Resp:  Normal effort  MSK:   Moves extremities without difficulty  Other:  Left great toe wound with sloughing of the skin circumferentially with bleeding of bright red blood.  Soft tissue swelling of the dorsum of the foot with mild soft tissue swelling in the distal lower leg as well without erythema, induration, or warmth to the touch.  Palpable 1+ pedal pulse in the left foot with normal capillary refill in the second through fifth digits.  Medical Decision Making  Medically screening exam initiated at 1:59 AM.  Appropriate orders placed.  [name] was informed that the remainder of the evaluation will be completed by another provider, this initial triage assessment does not replace that evaluation, and the importance of remaining in the ED until their evaluation is complete.  This chart was dictated using voice recognition software, Dragon. Despite the best efforts of this provider to proofread and correct errors, errors may still occur which can change documentation meaning.    Paris Lore, PA-C 03/09/22 [phone]

## 2022-03-09 NOTE — Assessment & Plan Note (Addendum)
BP stable. - Continue Farxiga, metoprolol, ramipril - Continue to monitor

## 2022-03-09 NOTE — Progress Notes (Signed)
Pt set up on cpap. ?

## 2022-03-09 NOTE — H&P (Signed)
Hospital Admission History and Physical Service Pager: [phone]  Patient name: Austin Vaughan Medical record number: [id number] Date of Birth: 09/17/1970 Age: 51 y.o. Gender: male  Primary Care Provider: Storm Frisk, MD Consultants: Podiatry Code Status: Full  Preferred Emergency Contact: Austin Vaughan (wife)- [phone]   Chief Complaint: Foot ulcer   Assessment and Plan: Austin Vaughan is a 51 y.o. male presenting with L foot ulcer with c/f osteomyelitis. 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  X-ray did not show osteomyelitis, MRI pending.  White count stable, lactic acid elevated at 2.  Afebrile, vitals stable. Follows with Dr. Allena Katz podiatrist outpatient.  Podiatrist consulted and will see patient today. - Admit to FPTS MedSurg, attending Dr. Lum Babe - Podiatry following, appreciate care and recommendations - Follow-up MRI - Follow-up blood cultures - Follow-up repeat lactic acid - AM BMP, CBC - Continue vancomycin, Flagyl, cefepime - Tylenol 650 mg every 6 hrs as needed for pain - Vitals per floor - PT OT  Type 2 diabetes mellitus with hyperglycemia, with long-term current use of insulin (HCC) A1c 7.9 this month, down from 9.3 6 months ago. Insulin regimen includes sliding scale with meals, lantus 40 units at night. Takes Metformin 1000mg  BID. Takes Gabapentin 3600 mg at night, Lyrica 200mg  BID for diabetic neuropathy - Monitor CBGs - Sensitive sliding scale insulin - Semglee 20 units at night  Essential hypertension BP 132/88 - Continue Farxiga, metoprolol, ramipril - Continue to monitor   Other chronic conditions: CAD- Hx MI 07/2012 with stent placement.  Follows with cardiology outpatient. Continue Atorvastatin 80 mg daily, Aspirin 81mg , Metoprolol 100 mg daily Ramipril 5  mg daily nitroglycerin patch daily for chest pain Chronic systolic and diastolic CHF  (EF 55-60%) Sleep apnea- CPAP machine at home  Restless leg- Requip 4mg  daily  GERD- Protonix 40mg  daily  Seasonal allergies- Zyrtec 10mg , Singulair 10mg    Denies tobacco use, alcohol use, recreational drug use    FEN/GI: Carb modified/ heart healthy  VTE Prophylaxis: Lovenox  Disposition: Pending further work-up  History of Present Illness:  Austin Vaughan is a 51 y.o. male presenting with left foot ulcer.  About 4 months ago started as a small ulcer. Follow with podiatrist Dr. out patient. Dr. said to put iodine on it 3 weeks ago and keep it dry. Did debridemenet on the 12th. Had been using a cast when in the shower but kept getting worse. Went to the hospital were he lives near [place] and was given antibiotics (Clindamycin) about 3 days days ago. States in the past couple of days pain worsened despite taking the abx. Takes his regular medication including Gabapentin and Lyrica. Nothing additional for pain.   Denies tobacco use, alcohol use, recreational drug use   In ED XR obtained which showed soft tissue ulcer, negative for osteomyelitis.  MRI ordered and pending.  Started on Vanc, Cefepime and Flagyl.  Podiatry consulted and Dr. will see patient.   Review Of Systems: Per HPI   Pertinent Past Medical History: See problem list above  Pertinent Past Surgical History:  Remainder reviewed in history tab.   Pertinent Social History: Tobacco use: No Alcohol use: No Other Substance use: No Lives with wife  Pertinent Family History: Reviewed in history tab.  Objective: BP 132/88   Pulse 85   Temp 97.7 F (36.5 C) (Oral)   Resp 15   SpO2 93%  Exam: General: alert, pleasant, NAD Eyes: EOMI. Normal conjunctiva  Neck: supple. Normal ROM Cardiovascular: RRR no murmurs  Respiratory: CTAB. Normal WOB Gastrointestinal: soft, non tender, non distended  MSK:  Moves extremities equally bilaterally.  Decreased sensation of lower extremities bilaterally up to shin Neuro: Alert and oriented.  Cranial nerves intact. Psych: Mood and affect appropriate.  Speech not tangential.  Thought content normal Derm: Left first toe with weeping ulcer, erythematous, necrotic    Labs:  CBC BMET  Recent Labs  Lab 03/09/22 0208  WBC 10.3  HGB 14.2  HCT 45.1  PLT 324   Recent Labs  Lab 03/09/22 0208  NA 137  K 3.8  CL 102  CO2 24  BUN 13  CREATININE 0.94  GLUCOSE 190*  CALCIUM 9.4     EKG: NSR   Imaging Studies Performed:  Imaging Study: Foot x-ray Impression: Soft tissue ulcer on medial aspect of first distal phalanx.  Does not show osteomyelitis   Cora Collum, DO 03/09/2022, 11:35 AM PGY-3, [facility]  FPTS Intern pager: [phone], text pages welcome Secure chat group [facility] Teaching Service

## 2022-03-09 NOTE — Assessment & Plan Note (Addendum)
Stable. Plan for left hallux amputation today.  -Podiatry following, appreciate recs -Continue vanc and cefepime (pharmacy dosing), flagyl -Continue tylenol for pain -CBC tomorrow 7/30 AM post-op

## 2022-03-09 NOTE — Consult Note (Signed)
Reason for Consult: Diabetic foot infection of left great toe Referring Physician: Jodi Geralds, PA  Austin Vaughan is an 51 y.o. male.  HPI: His toenail and toe has been worsening over the last month, he has superficial ulceration that began and he saw Dr. Irving Shows on 02/01/2022 followed by Dr. Allena Katz on 02/22/2022.  Dr. Allena Katz ordered vascular studies but so far these have not been completed, since then the great toe worsened significantly, he went to [facility] but he prefers to see his doctors in [place] because he used to live here.  He presented to the ER for evaluation this morning  Past Medical History:  Diagnosis Date   Coronary artery disease    Diabetes mellitus without complication (HCC)    Diabetic foot ulcer (HCC) 08/23/2019   GERD (gastroesophageal reflux disease) 06/21/2021   Hypertension     Past Surgical History:  Procedure Laterality Date   CORONARY ANGIOPLASTY WITH STENT PLACEMENT  07/2002    Family History  Problem Relation Age of Onset   ALS Mother    Lung cancer Father    ALS Maternal Aunt    Heart disease Paternal Grandmother    Heart disease Paternal Grandfather     Social History:  reports that he has never smoked. He has never used smokeless tobacco. He reports that he does not currently use alcohol. He reports that he does not use drugs.  Allergies:  Allergies  Allergen Reactions   Ambien [Zolpidem Tartrate] Other (See Comments)    HALLUCINATIONS   Shellfish Allergy Anaphylaxis    Seafood    Medications: I have reviewed the patient's current medications.  Results for orders placed or performed during the hospital encounter of 03/09/22 (from the past 48 hour(s))  Comprehensive metabolic panel     Status: Abnormal   Collection Time: 03/09/22  2:08 AM  Result Value Ref Range   Sodium 137 135 - 145 mmol/L   Potassium 3.8 3.5 - 5.1 mmol/L   Chloride 102 98 - 111 mmol/L   CO2 24 22 - 32 mmol/L   Glucose, Bld 190 (H) 70 - 99 mg/dL     Comment: Glucose reference range applies only to samples taken after fasting for at least 8 hours.   BUN 13 6 - 20 mg/dL   Creatinine, Ser 7.34 0.61 - 1.24 mg/dL   Calcium 9.4 8.9 - 19.3 mg/dL   Total Protein 8.1 6.5 - 8.1 g/dL   Albumin 4.3 3.5 - 5.0 g/dL   AST 13 (L) 15 - 41 U/L   ALT 18 0 - 44 U/L   Alkaline Phosphatase 125 38 - 126 U/L   Total Bilirubin 1.2 0.3 - 1.2 mg/dL   GFR, Estimated >79 >02 mL/min    Comment: (NOTE) Calculated using the CKD-EPI Creatinine Equation (2021)    Anion gap 11 5 - 15    Comment: Performed at [facility], [address], [place], [place] [address]  CBC with Differential     Status: None   Collection Time: 03/09/22  2:08 AM  Result Value Ref Range   WBC 10.3 4.0 - 10.5 K/uL   RBC 5.19 4.22 - 5.81 MIL/uL   Hemoglobin 14.2 13.0 - 17.0 g/dL   HCT 53.2 99.2 - 42.6 %   MCV 86.9 80.0 - 100.0 fL   MCH 27.4 26.0 - 34.0 pg   MCHC 31.5 30.0 - 36.0 g/dL   RDW 83.4 19.6 - 22.2 %   Platelets 324 150 -  400 K/uL   nRBC 0.0 0.0 - 0.2 %   Neutrophils Relative % 73 %   Neutro Abs 7.4 1.7 - 7.7 K/uL   Lymphocytes Relative 18 %   Lymphs Abs 1.9 0.7 - 4.0 K/uL   Monocytes Relative 8 %   Monocytes Absolute 0.8 0.1 - 1.0 K/uL   Eosinophils Relative 1 %   Eosinophils Absolute 0.1 0.0 - 0.5 K/uL   Basophils Relative 0 %   Basophils Absolute 0.0 0.0 - 0.1 K/uL   Immature Granulocytes 0 %   Abs Immature Granulocytes 0.04 0.00 - 0.07 K/uL    Comment: Performed at [facility], [address], [place], [place] [address]  Lactic acid     Status: Abnormal   Collection Time: 03/09/22  2:08 AM  Result Value Ref Range   Lactic Acid, Venous 2.0 (HH) 0.5 - 1.9 mmol/L    Comment: CRITICAL RESULT CALLED TO, READ BACK BY AND VERIFIED WITH [facility] J,RN 03/09/22 0311 WAYK Performed at [facility], [address] [place], [place] [address]   Blood Cultures x 2 sites     Status: None (Preliminary result)   Collection Time: 03/09/22  2:13 AM   Specimen:  BLOOD  Result Value Ref Range   Specimen Description BLOOD SITE NOT SPECIFIED    Special Requests      BOTTLES DRAWN AEROBIC AND ANAEROBIC Blood Culture results may not be optimal due to an excessive volume of blood received in culture bottles   Culture      NO GROWTH < 12 HOURS Performed at [facility], [address], [place], [place] [address]    Report Status PENDING   Blood Cultures x 2 sites     Status: None (Preliminary result)   Collection Time: 03/09/22  5:23 AM   Specimen: BLOOD  Result Value Ref Range   Specimen Description BLOOD RIGHT ANTECUBITAL    Special Requests      BOTTLES DRAWN AEROBIC AND ANAEROBIC Blood Culture results may not be optimal due to an excessive volume of blood received in culture bottles   Culture      NO GROWTH < 12 HOURS Performed at [facility], [address] [place], [place] [address]    Report Status PENDING     DG Foot Complete Left  Result Date: 03/09/2022 CLINICAL DATA:  Foot ulcer EXAM: LEFT FOOT - COMPLETE 3+ VIEW COMPARISON:  None Available. FINDINGS: No fracture or dislocation is seen. Soft tissue ulcer along the medial aspect of the 1st distal phalanx. No associated cortical destruction to suggest osteomyelitis. Mild degenerative changes of the 1st MTP joint. IMPRESSION: Soft tissue ulcer along the medial aspect of the 1st distal phalanx. No radiographic findings of osteomyelitis. Electronically Signed   By: Charline Bills M.D.   On: 03/09/2022 03:04    ROS Blood pressure (!) 141/78, pulse 86, temperature 98 F (36.7 C), temperature source Oral, resp. rate 15, SpO2 96 %.  Vitals:   03/09/22 0930 03/09/22 1208  BP: 132/88 (!) 141/78  Pulse: 85 86  Resp: 15 15  Temp:  98 F (36.7 C)  SpO2: 93% 96%    General AA&O x3. Normal mood and affect.  Vascular Dorsalis pedis and posterior tibial pulses  absent bilaterally  Capillary refill normal to all digits. Pedal hair growth diminished.  Neurologic Epicritic  sensation grossly absent.  Dermatologic (Wound) Necrotic ulceration with cellulitis distal left hallux with erythema to the MTPJ, purulent drainage  Orthopedic: Motor intact BLE.    Assessment/Plan:  Osteomyelitis left hallux, cellulitis left hallux, diabetic foot infection, PAD -Imaging: Studies independently reviewed -Antibiotics: Recommend broad-spectrum antibiotics vancomycin and Zosyn or Vanco/cefepime/Flagyl -WB Status: WBAT -Wound Care: Betadine wet-to-dry dressings -Surgical Plan: Pending results of MRI which is ordered.  I have also ordered stat ABIs, suspect he has likely some peripheral arterial disease.  I will discuss with vascular surgery if there is abnormalities on the exams.  I discussed with him he likely will end up with at least amputation of the hallux, we will plan for surgery tomorrow or Saturday pending results of vascular testing  Edwin Cap 03/09/2022, 12:10 PM   Best available via secure chat for questions or concerns.

## 2022-03-09 NOTE — Telephone Encounter (Signed)
Asher Muir called from the ER department at [facility], One of the PA would like to do a hospital consult for a wound infection from our on call provider.  Please advise  Patsy [place], [place] [phone]

## 2022-03-09 NOTE — TOC Initial Note (Signed)
Transition of Care [facility]) - Initial/Assessment Note    Patient Details  Name: Austin Vaughan MRN: [id number] Date of Birth: 1970/12/01  Transition of Care [facility]) CM/SW Contact:    Lockie Pares, RN Phone Number: 03/09/2022, 2:25 PM  Clinical Narrative:                  Care Management has reviewed the patient and  did not identify any needs. The patient will be followed via progression daily If needs do arise please place a TOC consult.        Patient Goals and CMS Choice        Expected Discharge Plan and Services                                                Prior Living Arrangements/Services                       Activities of Daily Living      Permission Sought/Granted                  Emotional Assessment              Admission diagnosis:  Foot ulcer (HCC) [L97.509] Patient Active Problem List   Diagnosis Date Noted   Foot ulcer (HCC) 03/09/2022   Wound infection    PAD (peripheral artery disease) (HCC)    Restless leg syndrome 10/24/2021   GERD (gastroesophageal reflux disease) 06/21/2021   Diabetic infection of left foot (HCC) 02/03/2021   Obesity (BMI 30.0-34.9) 11/03/2020   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) 11/03/2020   Dental caries 11/03/2020   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) 11/03/2020   Asthma, moderate persistent 09/26/2020   Type 2 diabetes mellitus with diabetic polyneuropathy, with long-term current use of insulin (HCC) 11/28/2018   PSVT (paroxysmal supraventricular tachycardia) (HCC) 10/22/2018   CAD S/P percutaneous coronary angioplasty 09/17/2018   Dyslipidemia, goal LDL below 70 09/17/2018   Essential hypertension    Anxiety with depression 05/07/2017   Obstructive sleep apnea 05/29/2016   Family history of amyotrophic lateral sclerosis 05/14/2015   History of stroke 04/08/2015   PCP:  Storm Frisk, MD Pharmacy:   [id number] - [place],  [place] - [address] [place] [address] Phone: [phone] Fax: [phone]     Social Determinants of Health (SDOH) Interventions    Readmission Risk Interventions     No data to display

## 2022-03-10 DIAGNOSIS — Z888 Allergy status to other drugs, medicaments and biological substances status: Secondary | ICD-10-CM | POA: Diagnosis not present

## 2022-03-10 DIAGNOSIS — E1152 Type 2 diabetes mellitus with diabetic peripheral angiopathy with gangrene: Secondary | ICD-10-CM | POA: Diagnosis present

## 2022-03-10 DIAGNOSIS — M869 Osteomyelitis, unspecified: Secondary | ICD-10-CM

## 2022-03-10 DIAGNOSIS — I10 Essential (primary) hypertension: Secondary | ICD-10-CM | POA: Diagnosis not present

## 2022-03-10 DIAGNOSIS — J302 Other seasonal allergic rhinitis: Secondary | ICD-10-CM | POA: Diagnosis present

## 2022-03-10 DIAGNOSIS — E1142 Type 2 diabetes mellitus with diabetic polyneuropathy: Secondary | ICD-10-CM | POA: Diagnosis present

## 2022-03-10 DIAGNOSIS — Z955 Presence of coronary angioplasty implant and graft: Secondary | ICD-10-CM | POA: Diagnosis not present

## 2022-03-10 DIAGNOSIS — Z794 Long term (current) use of insulin: Secondary | ICD-10-CM | POA: Diagnosis not present

## 2022-03-10 DIAGNOSIS — G4730 Sleep apnea, unspecified: Secondary | ICD-10-CM | POA: Diagnosis present

## 2022-03-10 DIAGNOSIS — L97529 Non-pressure chronic ulcer of other part of left foot with unspecified severity: Secondary | ICD-10-CM

## 2022-03-10 DIAGNOSIS — K219 Gastro-esophageal reflux disease without esophagitis: Secondary | ICD-10-CM | POA: Diagnosis present

## 2022-03-10 DIAGNOSIS — Z91013 Allergy to seafood: Secondary | ICD-10-CM | POA: Diagnosis not present

## 2022-03-10 DIAGNOSIS — I2510 Atherosclerotic heart disease of native coronary artery without angina pectoris: Secondary | ICD-10-CM | POA: Diagnosis present

## 2022-03-10 DIAGNOSIS — I5042 Chronic combined systolic (congestive) and diastolic (congestive) heart failure: Secondary | ICD-10-CM | POA: Diagnosis present

## 2022-03-10 DIAGNOSIS — E11628 Type 2 diabetes mellitus with other skin complications: Secondary | ICD-10-CM | POA: Diagnosis present

## 2022-03-10 DIAGNOSIS — L97509 Non-pressure chronic ulcer of other part of unspecified foot with unspecified severity: Secondary | ICD-10-CM | POA: Diagnosis present

## 2022-03-10 DIAGNOSIS — G4733 Obstructive sleep apnea (adult) (pediatric): Secondary | ICD-10-CM | POA: Diagnosis not present

## 2022-03-10 DIAGNOSIS — L03032 Cellulitis of left toe: Secondary | ICD-10-CM | POA: Diagnosis present

## 2022-03-10 DIAGNOSIS — G2581 Restless legs syndrome: Secondary | ICD-10-CM | POA: Diagnosis present

## 2022-03-10 DIAGNOSIS — E872 Acidosis, unspecified: Secondary | ICD-10-CM | POA: Diagnosis present

## 2022-03-10 DIAGNOSIS — I252 Old myocardial infarction: Secondary | ICD-10-CM | POA: Diagnosis not present

## 2022-03-10 DIAGNOSIS — L97521 Non-pressure chronic ulcer of other part of left foot limited to breakdown of skin: Secondary | ICD-10-CM | POA: Diagnosis present

## 2022-03-10 DIAGNOSIS — E1165 Type 2 diabetes mellitus with hyperglycemia: Secondary | ICD-10-CM | POA: Diagnosis present

## 2022-03-10 DIAGNOSIS — Z7984 Long term (current) use of oral hypoglycemic drugs: Secondary | ICD-10-CM | POA: Diagnosis not present

## 2022-03-10 DIAGNOSIS — Z9989 Dependence on other enabling machines and devices: Secondary | ICD-10-CM | POA: Diagnosis not present

## 2022-03-10 DIAGNOSIS — M868X8 Other osteomyelitis, other site: Secondary | ICD-10-CM | POA: Diagnosis present

## 2022-03-10 DIAGNOSIS — I110 Hypertensive heart disease with heart failure: Secondary | ICD-10-CM | POA: Diagnosis present

## 2022-03-10 DIAGNOSIS — I96 Gangrene, not elsewhere classified: Secondary | ICD-10-CM | POA: Diagnosis not present

## 2022-03-10 DIAGNOSIS — Z7982 Long term (current) use of aspirin: Secondary | ICD-10-CM | POA: Diagnosis not present

## 2022-03-10 DIAGNOSIS — E11621 Type 2 diabetes mellitus with foot ulcer: Secondary | ICD-10-CM | POA: Diagnosis present

## 2022-03-10 DIAGNOSIS — Z79899 Other long term (current) drug therapy: Secondary | ICD-10-CM | POA: Diagnosis not present

## 2022-03-10 LAB — CBC
HCT: 39.5 % (ref 39.0–52.0)
Hemoglobin: 12.4 g/dL — ABNORMAL LOW (ref 13.0–17.0)
MCH: 27.2 pg (ref 26.0–34.0)
MCHC: 31.4 g/dL (ref 30.0–36.0)
MCV: 86.6 fL (ref 80.0–100.0)
Platelets: 254 10*3/uL (ref 150–400)
RBC: 4.56 MIL/uL (ref 4.22–5.81)
RDW: 12.8 % (ref 11.5–15.5)
WBC: 10.2 10*3/uL (ref 4.0–10.5)
nRBC: 0 % (ref 0.0–0.2)

## 2022-03-10 LAB — BASIC METABOLIC PANEL
Anion gap: 12 (ref 5–15)
BUN: 16 mg/dL (ref 6–20)
CO2: 23 mmol/L (ref 22–32)
Calcium: 8.6 mg/dL — ABNORMAL LOW (ref 8.9–10.3)
Chloride: 102 mmol/L (ref 98–111)
Creatinine, Ser: 0.74 mg/dL (ref 0.61–1.24)
GFR, Estimated: >60 mL/min (ref >60)
Glucose, Bld: 99 mg/dL (ref 70–99)
Potassium: 3.6 mmol/L (ref 3.5–5.1)
Sodium: 137 mmol/L (ref 135–145)

## 2022-03-10 LAB — GLUCOSE, CAPILLARY
Glucose-Capillary: 141 mg/dL — ABNORMAL HIGH (ref 70–99)
Glucose-Capillary: 131 mg/dL — ABNORMAL HIGH (ref 70–99)
Glucose-Capillary: 117 mg/dL — ABNORMAL HIGH (ref 70–99)
Glucose-Capillary: 220 mg/dL — ABNORMAL HIGH (ref 70–99)

## 2022-03-10 MED ORDER — GABAPENTIN 400 MG PO CAPS
800.0000 mg | ORAL_CAPSULE | Freq: Two times a day (BID) | ORAL | Status: DC
  Administered 2022-03-10 – 2022-03-12 (×4): 800 mg via ORAL
  Filled 2022-03-10 (×4): qty 2

## 2022-03-10 MED ORDER — GABAPENTIN 600 MG PO TABS
1200.0000 mg | ORAL_TABLET | Freq: Every day | ORAL | Status: DC
  Administered 2022-03-10 – 2022-03-11 (×2): 1200 mg via ORAL
  Filled 2022-03-10 (×2): qty 2

## 2022-03-10 NOTE — Evaluation (Signed)
Occupational Therapy Evaluation Patient Details Name: Austin Vaughan MRN: [id number] DOB: 1971/01/13 Today's Date: 03/10/2022   History of Present Illness Pt is a 51 y.o. male presenting with L foot ulcer with c/f osteomyelitis. Surgery planned for 07/28 or 07/29. PMH of type 2 diabetes, chronic systolic and diastolic CHF (EF 03-47%), MI with stent placement, CAD, hypertension, GERD, OSA.   Clinical Impression   PTA, pt lives with family, typically Independent in all daily tasks with intermittent cane use d/t baseline balance deficits. Pt presents now with minor pain in L toe though mobilizing well. Pt able to demo ADLs/mobility in room without physical assist, including tub transfer practice with simulated heel WB in anticipation of sx. Educated re: importance of proper footwear in the home d/t decreased sensation in B feet, skin checks and elevation. Anticipate no OT needs at DC. Will follow-up post op to make finalized DC recs.       Recommendations for follow up therapy are one component of a multi-disciplinary discharge planning process, led by the attending physician.  Recommendations may be updated based on patient status, additional functional criteria and insurance authorization.   Follow Up Recommendations  No OT follow up    Assistance Recommended at Discharge PRN  Patient can return home with the following      Functional Status Assessment  Patient has not had a recent decline in their functional status  Equipment Recommendations   (TBD post op)    Recommendations for Other Services       Precautions / Restrictions Precautions Precautions: Fall Restrictions Weight Bearing Restrictions: Yes LLE Weight Bearing: Weight bearing as tolerated Other Position/Activity Restrictions: though encouraged practice of WB through heel based on pending sx      Mobility Bed Mobility Overal bed mobility: Independent                  Transfers Overall transfer level:  Independent Equipment used: None                      Balance Overall balance assessment: No apparent balance deficits (not formally assessed)                                         ADL either performed or assessed with clinical judgement   ADL Overall ADL's : Modified independent;Needs assistance/impaired Eating/Feeding: Independent   Grooming: Modified independent;Standing   Upper Body Bathing: Modified independent   Lower Body Bathing: Modified independent   Upper Body Dressing : Modified independent   Lower Body Dressing: Modified independent Lower Body Dressing Details (indicate cue type and reason): able to easily bend down to reach feet Toilet Transfer: Ambulation;Modified Independent   Toileting- Clothing Manipulation and Hygiene: Sit to/from stand;Set up   Tub/ Shower Transfer: Supervision/safety;Ambulation;Tub transfer Tub/Shower Transfer Details (indicate cue type and reason): simulated stepping over tub with bedrail as grab bar. encouraged heel WB practice d/t pending sx. Pt reports he has a cover to place over L foot that he has been using for showers Functional mobility during ADLs: Modified independent General ADL Comments: Moving well pre-op. emphasis on practice of heel WB, educated on importance of protective footwear d/t decreased sensation in B feet     Vision Baseline Vision/History: 1 Wears glasses Ability to See in Adequate Light: 0 Adequate Patient Visual Report: No change from baseline Vision Assessment?: No apparent visual deficits  Perception     Praxis      Pertinent Vitals/Pain Pain Assessment Pain Assessment: Faces Faces Pain Scale: Hurts a little bit Pain Location: L toe Pain Descriptors / Indicators: Grimacing Pain Intervention(s): Monitored during session     Hand Dominance Right   Extremity/Trunk Assessment Upper Extremity Assessment Upper Extremity Assessment: Overall WFL for tasks assessed    Lower Extremity Assessment Lower Extremity Assessment: Defer to PT evaluation   Cervical / Trunk Assessment Cervical / Trunk Assessment: Normal   Communication Communication Communication: No difficulties   Cognition Arousal/Alertness: Awake/alert Behavior During Therapy: WFL for tasks assessed/performed Overall Cognitive Status: Within Functional Limits for tasks assessed                                       General Comments       Exercises     Shoulder Instructions      Home Living Family/patient expects to be discharged to:: Private residence Living Arrangements: Spouse/significant other;Children Available Help at Discharge: Available 24 hours/day;Family (wife works from home) Type of Home: Apartment Home Access: Level entry     Home Layout: One level     Bathroom Shower/Tub: Chief Strategy Officer: Standard     Home Equipment: Cane - single point;Grab bars - tub/shower          Prior Functioning/Environment Prior Level of Function : Driving;Independent/Modified Independent             Mobility Comments: Uses cane sometimes for balance ADLs Comments: Pt does not go out in community much (limited mostly by shortness of breath due to cardiac PMH). Pt independent with basic ADL's. Pt's wife does cooking, laundry, and cleaning but pt states he can do these tasks if needed.        OT Problem List: Pain;Decreased knowledge of use of DME or AE;Decreased knowledge of precautions      OT Treatment/Interventions: Self-care/ADL training;Therapeutic exercise;Energy conservation;DME and/or AE instruction;Therapeutic activities;Patient/family education    OT Goals(Current goals can be found in the care plan section) Acute Rehab OT Goals Patient Stated Goal: have surgery OT Goal Formulation: With patient Time For Goal Achievement: 03/24/22 Potential to Achieve Goals: Good  OT Frequency: Min 2X/week    Co-evaluation               AM-PAC OT "6 Clicks" Daily Activity     Outcome Measure Help from another person eating meals?: None Help from another person taking care of personal grooming?: None Help from another person toileting, which includes using toliet, bedpan, or urinal?: A Little Help from another person bathing (including washing, rinsing, drying)?: A Little Help from another person to put on and taking off regular upper body clothing?: None Help from another person to put on and taking off regular lower body clothing?: A Little 6 Click Score: 21   End of Session Nurse Communication: Mobility status  Activity Tolerance: Patient tolerated treatment well Patient left: in bed;with call bell/phone within reach;with nursing/sitter in room  OT Visit Diagnosis: Pain Pain - Right/Left: Left Pain - part of body: Ankle and joints of foot                Time: 6948-5462 OT Time Calculation (min): 15 min Charges:  OT General Charges $OT Visit: 1 Visit OT Evaluation $OT Eval Low Complexity: 1 Low  Bradd Canary, OTR/L Acute Rehab Services Office: [phone]  Layla Maw 03/10/2022, 1:53 PM

## 2022-03-10 NOTE — Progress Notes (Signed)
  Subjective:  Patient ID: Austin Vaughan, male    DOB: 1971/08/14,  MRN: [id number]  Doing OK, he would like the toe sorted  Negative for chest pain and shortness of breath Constitutional signs: no Review of all other systems is negative Objective:   Vitals:   03/10/22 1728 03/10/22 2029  BP: 124/72 129/78  Pulse: 80 83  Resp: 17 16  Temp: 97.8 F (36.6 C) 98.7 F (37.1 C)  SpO2: 97% 96%   General AA&O x3. Normal mood and affect.  Vascular Dorsalis pedis and posterior tibial pulses 2/4 bilat. Brisk capillary refill to all digits. Pedal hair present.  Neurologic Epicritic sensation grossly intact.  Dermatologic Dressing clean dry and intact  Orthopedic: MMT 5/5 in dorsiflexion, plantarflexion, inversion, and eversion. Normal joint ROM without pain or crepitus.    Assessment & Plan:  Patient was evaluated and treated and all questions answered.  Left great toe infection, gangrene -MRI and ABI reviewed. No definite osteomyelitis but osteitis and possible SA. Despite this I think the soft tissue necrosis alone necessitates amputation. We discussed the risk/benefits of this. He wishes to proceed.  -OR tomorrow for left hallux amputation - NPO past midnight  Edwin Cap, DPM  Accessible via secure chat for questions or concerns.

## 2022-03-10 NOTE — Hospital Course (Addendum)
Austin Vaughan is a 51 y.o. male who presented with L foot ulcer and osteitis. PMH of type 2 diabetes, chronic systolic and diastolic CHF (EF 65-99%), MI with stent placement, CAD, hypertension, GERD, and OSA. A brief hospital course is listed below.   Osteitis with foot ulcer Presented with progressive ulceration to L great toe. Afebrile, VSS. LA 2>1.8. Bcx collected, NGTD. XR without osteomyelitis, MRI with osteitis. IV vanc, cefepime, and flagyl started. Tylenol with pain. Podiatry performed amputation on 7/29. Pain was controlled with oxycodone. Patient was well postop and discharged on 10 days of bactrim with follow up arranged by podiatry and with instructions to follow up with PCP.   Type 2 diabetes mellitus with hyperglycemia, with long-term current use of insulin (HCC) CBGs 100s over admission though increased to 200s with dexamethasone in OR. Given SSI and semglee 20 units (home SSI and lantus 40 units) with home [place]. Held metformin. Gabapentin and pregabalin doses were reduced on discharge. He was discharged on 25 units lantus with ability to titrate up as needed.   Other chronic conditions: HTN - stable on home metoprolol, ramipril CAD- Hx MI 07/2012 with stent placement. Follows with cardiology outpatient. Continue Atorvastatin 80 mg daily, Aspirin 81mg , Metoprolol 100 mg daily Ramipril 5 mg daily nitroglycerin patch daily for chest pain Chronic systolic and diastolic CHF- (EF 55-60%) Sleep apnea- CPAP machine at home  Restless leg- Requip 4mg  daily  GERD- Protonix 40mg  daily  Seasonal allergies- Zyrtec 10mg , Singulair 10mg    Issues for follow up: Wound healing, abx course Lantus decreased to 25 units daily, titrate up as needed Ensure f/u with podiatry Gabapentin and lyrica dosing - encourage spreading out of gabapentin dosage rather than 3600 mg QHS and removing lyrica given similar mechanism of action

## 2022-03-10 NOTE — Progress Notes (Signed)
Daily Progress Note Intern Pager: [phone]  Patient name: Austin Vaughan Medical record number: [id number] Date of birth: 10-29-1970 Age: 51 y.o. Gender: male  Primary Care Provider: Storm Frisk, MD Consultants: Podiatry Code Status: Full  Pt Overview and Major Events to Date:  7/27 - admitted, MRI with reactive osteitis  Assessment and Plan: Austin Vaughan is a 51 y.o. male presenting with L foot ulcer and osteitis. PMH of type 2 diabetes, chronic systolic and diastolic CHF (EF 40-08%), MI with stent placement, CAD, hypertension, GERD, and OSA.  * Osteitis Stable. Afebrile, WBC normal, LA downtrended. Bcx NGTD. ABIs with mild PAD bilaterally. MRI with osteitis and ulceration overlying. Podiatry to consider surgery for amputation either today or tomorrow (7/29). -Podiatry following, appreciate recs -Continue vanc, cefepime, flagyl -Continue tylenol for pain -AM BMP, CBC  Diabetic infection of left foot (HCC) CBGs 100s. A1c 7.9 this month, down from 9.3 just 6 months ago. Insulin regimen includes sliding scale with meals, lantus 40 units at night. Takes Metformin 1000mg  BID. Takes Gabapentin 3600 mg at night, Lyrica 200mg  BID for diabetic neuropathy - Monitor CBGs - Sensitive sliding scale insulin with 20u semglee - Semglee 20 units at night -- Continue gabapentin, lyrica  Essential hypertension BP stable. - Continue Farxiga, metoprolol, ramipril - Continue to monitor   FEN/GI: Carb modified PPx: Hold lovenox today in event of podiatric surgery Dispo:Home pending clinical improvement .  Subjective:  Doing well this morning overall. Would like his gabapentin and lyrica for neuropathic pain control.  Objective: Temp:  [97.8 F (36.6 C)-98.3 F (36.8 C)] 98.3 F (36.8 C) (07/28 0057) Pulse Rate:  [73-95] 95 (07/28 0645) Resp:  [15-18] 18 (07/28 0057) BP: (114-145)/(71-88) 145/81 (07/28 0645) SpO2:  [93 %-100 %] 96 % (07/28 0800) Physical Exam: General: Lying in  bed, in NAD, conversant Cardiovascular: Regular rate Respiratory: Breathing and speaking comfortably on RA Abdomen: Nondistended Extremities: L great toe bandaged after drainage, 2+ pitting edema to top of L shin, erythema up to mid L shin with TTP  Laboratory: Most recent CBC Lab Results  Component Value Date   WBC 10.2 03/10/2022   HGB 12.4 (L) 03/10/2022   HCT 39.5 03/10/2022   MCV 86.6 03/10/2022   PLT 254 03/10/2022   Most recent BMP    Latest Ref Rng & Units 03/10/2022    4:06 AM  BMP  Glucose 70 - 99 mg/dL 99   BUN 6 - 20 mg/dL 16   Creatinine 03/12/2022 - 1.24 mg/dL 03/12/2022   Sodium 6.76 - 1.95 mmol/L 137   Potassium 3.5 - 5.1 mmol/L 3.6   Chloride 98 - 111 mmol/L 102   CO2 22 - 32 mmol/L 23   Calcium 8.9 - 10.3 mg/dL 8.6    LA 093  Imaging/Diagnostic Tests: MRI L Foot IMPRESSION: 1. Shallow soft tissue ulceration along the medial aspect of the great toe with associated skin thickening and soft tissue edema. No well-defined fluid collections. 2. Faint bone marrow edema within the proximal and distal phalanx of the great toe, most pronounced at the head of the great toe proximal phalanx. Preservation of the fatty T1 marrow signal. Findings are favored to represent reactive osteitis rather than early acute osteomyelitis at this time. 3. Small joint effusion of the great toe IP joint, nonspecific and likely reactive although septic arthritis is not excluded.  267, MD 03/10/2022, 8:53 AM PGY-1, [facility] FPTS Intern pager: [phone], text pages welcome Secure  chat group [facility] Teaching Service

## 2022-03-10 NOTE — Evaluation (Addendum)
Physical Therapy Evaluation Patient Details Name: Austin Vaughan MRN: [id number] DOB: 10/01/1970 Today's Date: 03/10/2022  History of Present Illness  Pt is a 51 y.o. male presenting with L foot ulcer with c/f osteomyelitis. PMH of type 2 diabetes, chronic systolic and diastolic CHF (EF 10-31%), MI with stent placement, CAD, hypertension, GERD, OSA. Surgery planned for 07/28 or 07/29.  Clinical Impression  Pt agreeable to physical therapy evaluation. Pt currently performing all mobility at independent level. Will need to re-assess pt after surgery to establish goals, therapy frequency, and make any discharge recommendations. Pt educated on process. Will update.     Recommendations for follow up therapy are one component of a multi-disciplinary discharge planning process, led by the attending physician.  Recommendations may be updated based on patient status, additional functional criteria and insurance authorization.  Follow Up Recommendations Other (comment) (pending presentation after surgery)      Assistance Recommended at Discharge Other (comment) (pending presentation after surgery)  Patient can return home with the following  Other (comment) (pending presentation after surgery)    Equipment Recommendations  (pending presentation after surgery)  Recommendations for Other Services       Functional Status Assessment Patient has had a recent decline in their functional status and demonstrates the ability to make significant improvements in function in a reasonable and predictable amount of time.     Precautions / Restrictions Precautions Precautions: Fall Restrictions Weight Bearing Restrictions: Yes LLE Weight Bearing: Weight bearing as tolerated      Mobility  Bed Mobility Overal bed mobility: Independent                  Transfers Overall transfer level: Independent Equipment used: None                    Ambulation/Gait Ambulation/Gait assistance:  Modified independent (Device/Increase time), Independent Gait Distance (Feet): 200 Feet Assistive device: IV Pole Gait Pattern/deviations: Decreased stance time - left, Antalgic Gait velocity: decreased Gait velocity interpretation: 1.31 - 2.62 ft/sec, indicative of limited community ambulator   General Gait Details: Pt with no heel-toe sequencing on left and with increased foot ER to offload left big toe. No LOB occurred. HR and SpO2 stable on RA. Pt utilized IV pole in R hand.  Stairs            Wheelchair Mobility    Modified Rankin (Stroke Patients Only)       Balance Overall balance assessment: Mild deficits observed, not formally tested                                           Pertinent Vitals/Pain Pain Assessment Pain Assessment: 0-10 Pain Score: 7  Pain Location: left big toe Pain Descriptors / Indicators: Throbbing Pain Intervention(s): Limited activity within patient's tolerance, Monitored during session    Home Living Family/patient expects to be discharged to:: Private residence Living Arrangements: Spouse/significant other;Children Available Help at Discharge: Available 24 hours/day (Wife works from home) Type of Home: Apartment Home Access: Level entry       Home Layout: One level Home Equipment: Cane - single point;Grab bars - tub/shower      Prior Function Prior Level of Function : Driving;Independent/Modified Independent (on disability; denies falls)             Mobility Comments: Uses cane sometimes for balance ADLs Comments: Pt does not  go out in community much (limited mostly by shortness of breath due to cardiac PMH). Pt independent with basic ADL's. Pt's wife does cooking, laundry, and cleaning but pt states he can do these tasks if needed.     Hand Dominance   Dominant Hand: Right    Extremity/Trunk Assessment   Upper Extremity Assessment Upper Extremity Assessment: Overall WFL for tasks assessed    Lower  Extremity Assessment Lower Extremity Assessment: Overall WFL for tasks assessed B UE/LE numbness and tingling 2/2 neuropathy- pt educated on skin checks       Communication   Communication: No difficulties  Cognition Arousal/Alertness: Awake/alert Behavior During Therapy: WFL for tasks assessed/performed Overall Cognitive Status: Within Functional Limits for tasks assessed                                          General Comments      Exercises     Assessment/Plan    PT Assessment Patient needs continued PT services  PT Problem List Decreased activity tolerance;Decreased mobility;Pain       PT Treatment Interventions DME instruction;Gait training;Functional mobility training;Therapeutic exercise;Therapeutic activities;Balance training;Neuromuscular re-education;Patient/family education    PT Goals (Current goals can be found in the Care Plan section)  Acute Rehab PT Goals Patient Stated Goal: none stated PT Goal Formulation: With patient Time For Goal Achievement: 03/23/22 Potential to Achieve Goals:  (will establish goals after surgery as pt currently independent)    Frequency Other (Comment) (will establish frequency after surgery)     Co-evaluation               AM-PAC PT "6 Clicks" Mobility  Outcome Measure Help needed turning from your back to your side while in a flat bed without using bedrails?: None Help needed moving from lying on your back to sitting on the side of a flat bed without using bedrails?: None Help needed moving to and from a bed to a chair (including a wheelchair)?: None Help needed standing up from a chair using your arms (e.g., wheelchair or bedside chair)?: None Help needed to walk in hospital room?: None Help needed climbing 3-5 steps with a railing? : None 6 Click Score: 24    End of Session Equipment Utilized During Treatment:  (none) Activity Tolerance: Patient tolerated treatment well;Patient limited by  pain Patient left: in bed Nurse Communication: Mobility status;Weight bearing status PT Visit Diagnosis: Pain;Other abnormalities of gait and mobility (R26.89) Pain - Right/Left: Left Pain - part of body: Ankle and joints of foot    Time: 8657-8469 PT Time Calculation (min) (ACUTE ONLY): 20 min   Charges:   PT Evaluation $PT Eval Low Complexity: 1 Low          Tana Coast, PT   [employer] 03/10/2022, 11:06 AM

## 2022-03-11 ENCOUNTER — Inpatient Hospital Stay (HOSPITAL_COMMUNITY): Payer: Medicare Other | Admitting: Certified Registered"

## 2022-03-11 ENCOUNTER — Encounter (HOSPITAL_COMMUNITY)
Admission: EM | Disposition: A | Discharge status: Home / Self Care | Payer: Self-pay | Source: Home / Self Care | Attending: Family Medicine

## 2022-03-11 ENCOUNTER — Other Ambulatory Visit: Payer: Self-pay

## 2022-03-11 ENCOUNTER — Encounter (HOSPITAL_COMMUNITY): Payer: Self-pay | Admitting: Family Medicine

## 2022-03-11 DIAGNOSIS — Z794 Long term (current) use of insulin: Secondary | ICD-10-CM

## 2022-03-11 DIAGNOSIS — Z9989 Dependence on other enabling machines and devices: Secondary | ICD-10-CM

## 2022-03-11 DIAGNOSIS — G4733 Obstructive sleep apnea (adult) (pediatric): Secondary | ICD-10-CM | POA: Diagnosis not present

## 2022-03-11 DIAGNOSIS — E1152 Type 2 diabetes mellitus with diabetic peripheral angiopathy with gangrene: Secondary | ICD-10-CM | POA: Diagnosis not present

## 2022-03-11 DIAGNOSIS — I96 Gangrene, not elsewhere classified: Secondary | ICD-10-CM

## 2022-03-11 DIAGNOSIS — I10 Essential (primary) hypertension: Secondary | ICD-10-CM | POA: Diagnosis not present

## 2022-03-11 DIAGNOSIS — E1142 Type 2 diabetes mellitus with diabetic polyneuropathy: Secondary | ICD-10-CM | POA: Diagnosis not present

## 2022-03-11 DIAGNOSIS — M869 Osteomyelitis, unspecified: Secondary | ICD-10-CM | POA: Diagnosis not present

## 2022-03-11 DIAGNOSIS — L97529 Non-pressure chronic ulcer of other part of left foot with unspecified severity: Secondary | ICD-10-CM | POA: Diagnosis not present

## 2022-03-11 HISTORY — PX: TRANSMETATARSAL AMPUTATION: SHX6197

## 2022-03-11 LAB — CBC
HCT: 39.9 % (ref 39.0–52.0)
Hemoglobin: 12.5 g/dL — ABNORMAL LOW (ref 13.0–17.0)
MCH: 27.1 pg (ref 26.0–34.0)
MCHC: 31.3 g/dL (ref 30.0–36.0)
MCV: 86.4 fL (ref 80.0–100.0)
Platelets: 275 10*3/uL (ref 150–400)
RBC: 4.62 MIL/uL (ref 4.22–5.81)
RDW: 12.7 % (ref 11.5–15.5)
WBC: 8.1 10*3/uL (ref 4.0–10.5)
nRBC: 0 % (ref 0.0–0.2)

## 2022-03-11 LAB — GLUCOSE, CAPILLARY
Glucose-Capillary: 145 mg/dL — ABNORMAL HIGH (ref 70–99)
Glucose-Capillary: 132 mg/dL — ABNORMAL HIGH (ref 70–99)
Glucose-Capillary: 168 mg/dL — ABNORMAL HIGH (ref 70–99)

## 2022-03-11 LAB — BASIC METABOLIC PANEL
Anion gap: 9 (ref 5–15)
BUN: 13 mg/dL (ref 6–20)
CO2: 22 mmol/L (ref 22–32)
Calcium: 8.9 mg/dL (ref 8.9–10.3)
Chloride: 105 mmol/L (ref 98–111)
Creatinine, Ser: 0.73 mg/dL (ref 0.61–1.24)
GFR, Estimated: >60 mL/min (ref >60)
Glucose, Bld: 187 mg/dL — ABNORMAL HIGH (ref 70–99)
Potassium: 4 mmol/L (ref 3.5–5.1)
Sodium: 136 mmol/L (ref 135–145)

## 2022-03-11 SURGERY — AMPUTATION, FOOT, TRANSMETATARSAL
Anesthesia: Regional | Site: Toe | Laterality: Left

## 2022-03-11 MED ORDER — KETOROLAC TROMETHAMINE 30 MG/ML IJ SOLN
INTRAMUSCULAR | Status: AC
  Filled 2022-03-11: qty 1

## 2022-03-11 MED ORDER — OXYCODONE HCL 5 MG PO TABS
ORAL_TABLET | ORAL | Status: AC
  Filled 2022-03-11: qty 1

## 2022-03-11 MED ORDER — DAPAGLIFLOZIN PROPANEDIOL 10 MG PO TABS
10.0000 mg | ORAL_TABLET | Freq: Every day | ORAL | Status: DC
Start: 2022-03-12 — End: 2022-03-13
  Administered 2022-03-12: 10 mg via ORAL
  Filled 2022-03-11: qty 1

## 2022-03-11 MED ORDER — ACETAMINOPHEN 325 MG PO TABS
650.0000 mg | ORAL_TABLET | Freq: Four times a day (QID) | ORAL | Status: DC
  Administered 2022-03-11 – 2022-03-12 (×4): 650 mg via ORAL
  Filled 2022-03-11 (×4): qty 2

## 2022-03-11 MED ORDER — SODIUM CHLORIDE 0.9 % IV SOLN
INTRAVENOUS | Status: AC
Start: 2022-03-11 — End: 2022-03-11

## 2022-03-11 MED ORDER — OXYCODONE HCL 5 MG PO TABS
5.0000 mg | ORAL_TABLET | Freq: Once | ORAL | Status: AC | PRN
  Administered 2022-03-11: 5 mg via ORAL

## 2022-03-11 MED ORDER — PROPOFOL 500 MG/50ML IV EMUL
INTRAVENOUS | Status: DC | PRN
  Administered 2022-03-11: 50 ug/kg/min via INTRAVENOUS

## 2022-03-11 MED ORDER — BUPIVACAINE HCL (PF) 0.25 % IJ SOLN
INTRAMUSCULAR | Status: DC | PRN
  Administered 2022-03-11: .0001 mL

## 2022-03-11 MED ORDER — ACETAMINOPHEN 10 MG/ML IV SOLN
1000.0000 mg | Freq: Once | INTRAVENOUS | Status: DC | PRN
Start: 2022-03-11 — End: 2022-03-11

## 2022-03-11 MED ORDER — 0.9 % SODIUM CHLORIDE (POUR BTL) OPTIME
TOPICAL | Status: DC | PRN
  Administered 2022-03-11: 1000 mL

## 2022-03-11 MED ORDER — LIDOCAINE 2% (20 MG/ML) 5 ML SYRINGE
INTRAMUSCULAR | Status: AC
  Filled 2022-03-11: qty 5

## 2022-03-11 MED ORDER — SUCCINYLCHOLINE CHLORIDE 200 MG/10ML IV SOSY
PREFILLED_SYRINGE | INTRAVENOUS | Status: AC
  Filled 2022-03-11: qty 10

## 2022-03-11 MED ORDER — CHLORHEXIDINE GLUCONATE 0.12 % MT SOLN
15.0000 mL | Freq: Once | OROMUCOSAL | Status: AC
Start: 2022-03-11 — End: 2022-03-11

## 2022-03-11 MED ORDER — ROCURONIUM BROMIDE 10 MG/ML (PF) SYRINGE
PREFILLED_SYRINGE | INTRAVENOUS | Status: AC
  Filled 2022-03-11: qty 10

## 2022-03-11 MED ORDER — MIDAZOLAM HCL 2 MG/2ML IJ SOLN
INTRAMUSCULAR | Status: DC | PRN
  Administered 2022-03-11 (×2): 1 mg via INTRAVENOUS

## 2022-03-11 MED ORDER — AMISULPRIDE (ANTIEMETIC) 5 MG/2ML IV SOLN: 10.0000 mg | Freq: Once | INTRAVENOUS | Status: DC | PRN

## 2022-03-11 MED ORDER — FENTANYL CITRATE (PF) 250 MCG/5ML IJ SOLN
INTRAMUSCULAR | Status: AC
  Filled 2022-03-11: qty 5

## 2022-03-11 MED ORDER — ONDANSETRON HCL 4 MG/2ML IJ SOLN
INTRAMUSCULAR | Status: AC
  Filled 2022-03-11: qty 2

## 2022-03-11 MED ORDER — OXYCODONE HCL 5 MG/5ML PO SOLN: 5.0000 mg | Freq: Once | ORAL | Status: AC | PRN

## 2022-03-11 MED ORDER — ONDANSETRON HCL 4 MG/2ML IJ SOLN
INTRAMUSCULAR | Status: DC | PRN
  Administered 2022-03-11: 4 mg via INTRAVENOUS

## 2022-03-11 MED ORDER — PROMETHAZINE HCL 25 MG/ML IJ SOLN: 6.2500 mg | INTRAMUSCULAR | Status: DC | PRN

## 2022-03-11 MED ORDER — BUPIVACAINE-EPINEPHRINE (PF) 0.5% -1:200000 IJ SOLN
INTRAMUSCULAR | Status: DC | PRN
  Administered 2022-03-11: 30 mL via PERINEURAL

## 2022-03-11 MED ORDER — ORAL CARE MOUTH RINSE
15.0000 mL | Freq: Once | OROMUCOSAL | Status: AC
Start: 2022-03-11 — End: 2022-03-11

## 2022-03-11 MED ORDER — DEXAMETHASONE SODIUM PHOSPHATE 10 MG/ML IJ SOLN
INTRAMUSCULAR | Status: DC | PRN
  Administered 2022-03-11: 5 mg via INTRAVENOUS

## 2022-03-11 MED ORDER — ACETAMINOPHEN 325 MG PO TABS: 650.0000 mg | ORAL_TABLET | Freq: Four times a day (QID) | ORAL | Status: DC

## 2022-03-11 MED ORDER — CHLORHEXIDINE GLUCONATE 0.12 % MT SOLN
OROMUCOSAL | Status: AC
  Administered 2022-03-11: 15 mL via OROMUCOSAL
  Filled 2022-03-11: qty 15

## 2022-03-11 MED ORDER — BUPIVACAINE HCL (PF) 0.25 % IJ SOLN
INTRAMUSCULAR | Status: AC
Start: 2022-03-11 — End: ?
  Filled 2022-03-11: qty 30

## 2022-03-11 MED ORDER — OXYCODONE HCL 5 MG PO TABS
5.0000 mg | ORAL_TABLET | ORAL | Status: DC | PRN
  Administered 2022-03-12 (×2): 5 mg via ORAL
  Filled 2022-03-11 (×2): qty 1

## 2022-03-11 MED ORDER — FENTANYL CITRATE (PF) 250 MCG/5ML IJ SOLN
INTRAMUSCULAR | Status: DC | PRN
  Administered 2022-03-11 (×2): 50 ug via INTRAVENOUS

## 2022-03-11 MED ORDER — LACTATED RINGERS IV SOLN: INTRAVENOUS | Status: DC

## 2022-03-11 MED ORDER — FENTANYL CITRATE (PF) 100 MCG/2ML IJ SOLN: 25.0000 ug | INTRAMUSCULAR | Status: DC | PRN

## 2022-03-11 MED ORDER — INSULIN ASPART 100 UNIT/ML IJ SOLN
0.0000 [IU] | Freq: Three times a day (TID) | INTRAMUSCULAR | Status: DC
  Administered 2022-03-12: 2 [IU] via SUBCUTANEOUS
  Administered 2022-03-12: 3 [IU] via SUBCUTANEOUS

## 2022-03-11 MED ORDER — PROPOFOL 10 MG/ML IV BOLUS
INTRAVENOUS | Status: AC
  Filled 2022-03-11: qty 20

## 2022-03-11 MED ORDER — MIDAZOLAM HCL 2 MG/2ML IJ SOLN
INTRAMUSCULAR | Status: AC
  Filled 2022-03-11: qty 2

## 2022-03-11 MED ORDER — INSULIN ASPART 100 UNIT/ML IJ SOLN: 0.0000 [IU] | INTRAMUSCULAR | Status: DC | PRN

## 2022-03-11 MED ORDER — LACTATED RINGERS IV SOLN: INTRAVENOUS | Status: DC | PRN

## 2022-03-11 MED ORDER — LIDOCAINE 2% (20 MG/ML) 5 ML SYRINGE
INTRAMUSCULAR | Status: DC | PRN
  Administered 2022-03-11: 20 mg via INTRAVENOUS

## 2022-03-11 MED ORDER — PHENYLEPHRINE 80 MCG/ML (10ML) SYRINGE FOR IV PUSH (FOR BLOOD PRESSURE SUPPORT)
PREFILLED_SYRINGE | INTRAVENOUS | Status: AC
  Filled 2022-03-11: qty 10

## 2022-03-11 MED ORDER — KETOROLAC TROMETHAMINE 30 MG/ML IJ SOLN
30.0000 mg | Freq: Once | INTRAMUSCULAR | Status: AC
  Administered 2022-03-11: 30 mg via INTRAVENOUS

## 2022-03-11 SURGICAL SUPPLY — 38 items
BLADE LONG MED 31X9 (MISCELLANEOUS) IMPLANT
BNDG ELASTIC 4X5.8 VLCR STR LF (GAUZE/BANDAGES/DRESSINGS) ×1 IMPLANT
BNDG STRETCH 4X75 STRL LF (GAUZE/BANDAGES/DRESSINGS) ×1 IMPLANT
CHLORAPREP W/TINT 26 (MISCELLANEOUS) ×1 IMPLANT
COVER MAYO STAND STRL (DRAPES) ×1 IMPLANT
COVER SURGICAL LIGHT HANDLE (MISCELLANEOUS) ×2 IMPLANT
CUFF TOURN SGL QUICK 24 (TOURNIQUET CUFF)
CUFF TRNQT CYL 24X4X16.5-23 (TOURNIQUET CUFF) IMPLANT
DRAPE SURG 17X23 STRL (DRAPES) ×2 IMPLANT
DRSG XEROFORM 1X8 (GAUZE/BANDAGES/DRESSINGS) IMPLANT
GAUZE SPONGE 2X2 8PLY STRL LF (GAUZE/BANDAGES/DRESSINGS) IMPLANT
GAUZE SPONGE 4X4 12PLY STRL (GAUZE/BANDAGES/DRESSINGS) ×1 IMPLANT
GAUZE SPONGE 4X4 12PLY STRL LF (GAUZE/BANDAGES/DRESSINGS) IMPLANT
GAUZE XEROFORM 1X8 LF (GAUZE/BANDAGES/DRESSINGS) ×1 IMPLANT
GLOVE BIOGEL M 7.0 STRL (GLOVE) ×1 IMPLANT
GLOVE BIOGEL PI ORTHO PRO 7.5 (GLOVE) ×1
GLOVE PI ORTHO PRO STRL 7.5 (GLOVE) ×1 IMPLANT
GOWN STRL REUS W/ TWL LRG LVL3 (GOWN DISPOSABLE) ×2 IMPLANT
GOWN STRL REUS W/TWL LRG LVL3 (GOWN DISPOSABLE) ×2
KIT BASIN OR (CUSTOM PROCEDURE TRAY) ×2 IMPLANT
KIT TURNOVER KIT B (KITS) ×2 IMPLANT
NDL HYPO 25GX1X1/2 BEV (NEEDLE) IMPLANT
NEEDLE HYPO 25GX1X1/2 BEV (NEEDLE) ×2 IMPLANT
NS IRRIG 1000ML POUR BTL (IV SOLUTION) ×2 IMPLANT
PACK ORTHO EXTREMITY (CUSTOM PROCEDURE TRAY) ×2 IMPLANT
PAD ARMBOARD 7.5X6 YLW CONV (MISCELLANEOUS) ×4 IMPLANT
SOL PREP POV-IOD 4OZ 10% (MISCELLANEOUS) ×3 IMPLANT
SPECIMEN JAR SMALL (MISCELLANEOUS) ×2 IMPLANT
SPONGE GAUZE 2X2 STER 10/PKG (GAUZE/BANDAGES/DRESSINGS)
STAPLER VISISTAT 35W (STAPLE) ×1 IMPLANT
SUT ETHILON 3 0 PS 1 (SUTURE) ×2 IMPLANT
SUT MNCRL AB 4-0 PS2 18 (SUTURE) ×1 IMPLANT
SYR CONTROL 10ML LL (SYRINGE) ×1 IMPLANT
TOWEL GREEN STERILE (TOWEL DISPOSABLE) ×2 IMPLANT
TOWEL GREEN STERILE FF (TOWEL DISPOSABLE) ×2 IMPLANT
TUBE CONNECTING 12X1/4 (SUCTIONS) ×1 IMPLANT
UNDERPAD 30X36 HEAVY ABSORB (UNDERPADS AND DIAPERS) ×2 IMPLANT
WATER STERILE IRR 1000ML POUR (IV SOLUTION) ×2 IMPLANT

## 2022-03-11 NOTE — Progress Notes (Signed)
Patient stated he would put CPAP on himself. Adjusted pressure per his request and mask and machine within reach for pt. Unit plugged in to correct outlet and working correctly. Told pt to call if he needed help.

## 2022-03-11 NOTE — Progress Notes (Addendum)
FMTS Brief Progress Note  S: Arrived at patient's bedside for night rounds after left great toe amputation today d/t left hallux infection. Patient report he's doing well and pain is well controlled. He reported an incident of bleeding from the surgical site while going to the bathroom. Nurse was able to attend to him on time, applied pressure and dressed the wound with compression dressing. He denies noticing any further bleeding after the dressing  General: Alert, well appearing, NAD CV: RRR, no murmurs Pulm: Normal WOB on RA Ext: No BLE edema, Left foot dressed in bandage. Dressing is dry and clean.   O: BP 124/70 (BP Location: Left Arm)   Pulse 78   Temp 98.1 F (36.7 C) (Oral)   Resp 18   SpO2 95%    A/P: Left great toe gangrene POD 0 of left great toe amputation. Patient is currently stable and pain well controlled. -Pain regimen   Tylenol 650 mg Q6H Oxycodone IR 5 mg every 4 hours PRN - Orders reviewed. Labs for AM ordered, which was adjusted as needed.  -Follow-up plan per day team  Jerre Simon, MD 03/11/2022, 9:58 PM PGY-2, [facility] Night Resident  Please page [phone] with questions.

## 2022-03-11 NOTE — Progress Notes (Addendum)
Daily Progress Note Intern Pager: [phone]  Patient name: Austin Vaughan Medical record number: [id number] Date of birth: 01/10/1971 Age: 51 y.o. Gender: male  Primary Care Provider: Storm Frisk, MD Consultants: Podiatry  Code Status: Full   Pt Overview and Major Events to Date:  7/27 - admitted, MRI with reactive osteitis, and possible septic arthritis  7/29 - Podiatry plan to amputate today   Assessment and Plan: Austin Vaughan is a 51 y.o. male presenting with L foot ulcer found to have reactive osteitis and possible septic arthritis.   * Osteitis Stable. Plan for left hallux amputation today.  -Podiatry following, appreciate recs -Continue vanc and cefepime (pharmacy dosing), flagyl -Continue tylenol for pain -CBC tomorrow 7/30 AM post-op   Essential hypertension BP stable. - Continue Farxiga, metoprolol, ramipril - Continue to monitor  Type 2 diabetes mellitus with diabetic polyneuropathy, with long-term current use of insulin (HCC) A1c 7.9 this month, down from 9.3 just 6 months ago. Home Insulin regimen: qHS lantus 40, SSI w/meals.  - Held lantus last night, restart insulin after starting diet.  - Monitor CBGs - Semglee 20 units at night, SSI  -- Continue gabapentin 800 mg BID, 1200 mg qHS (takes 3600 mg qHS at home), lyrica 300 mg qHS -- Continue Farxiga     Chronic Conditions:  CAD (MI 07/2022 w/ stent) - cont. Atorvastatin 80 mg, aspirin 81 mg, metoprolol 100 mg, Ramipril 5 mg, nitroglycerin patch daily  Restless leg - Requip 4 mg daily  GERD - Protonix 40 mg daily  Seasonal Allergies - Claritin 10 mg, Singulair 10 mg   FEN/GI: NPO for amputation today 7/29, 160 mL/hr NS for 8hrs PPx: on home protonix, holding lovenox for surgery  Dispo:Pending PT/OT eval post surgery   Subjective:  Patient has no complaints. Reports 2/10 foot pain. Reports minimal lower abdominal pain similar to his prior which he says he takes medicine for. Denies CP, SOB, heart  palpitations, nausea, vomiting, diarrhea. Says his last BM was yesterday.   Objective: Temp:  [97.8 F (36.6 C)-98.7 F (37.1 C)] 98.7 F (37.1 C) (07/29 0550) Pulse Rate:  [80-95] 92 (07/29 0550) Resp:  [16-18] 16 (07/29 0550) BP: (115-145)/(64-81) 129/65 (07/29 0550) SpO2:  [96 %-97 %] 97 % (07/29 0550) Physical Exam: General: Well appearing, comfortably laying in bed  Cardiovascular: RRR, radial pulses equal  Respiratory: breathing comfortably on room air  Abdomen: Soft, mildly distended, no increased tenderness to light or deep palpation  Extremities: L great toe ulcerated, mild edema and warmth to lower left extremity. No pitting edema or erythema.   Laboratory: Most recent CBC Lab Results  Component Value Date   WBC 8.1 03/11/2022   HGB 12.5 (L) 03/11/2022   HCT 39.9 03/11/2022   MCV 86.4 03/11/2022   PLT 275 03/11/2022   Most recent BMP    Latest Ref Rng & Units 03/11/2022   12:57 AM  BMP  Glucose 70 - 99 mg/dL 932   BUN 6 - 20 mg/dL 13   Creatinine 6.71 - 1.24 mg/dL 2.45   Sodium 809 - 983 mmol/L 136   Potassium 3.5 - 5.1 mmol/L 4.0   Chloride 98 - 111 mmol/L 105   CO2 22 - 32 mmol/L 22   Calcium 8.9 - 10.3 mg/dL 8.9     Lockie Mola, MD 03/11/2022, 5:53 AM  PGY-1, [facility] FPTS Intern pager: [phone], text pages welcome Secure chat group [facility] Teaching Service

## 2022-03-11 NOTE — Assessment & Plan Note (Signed)
AM glucose 240. Home Insulin regimen: qHS lantus 40, SSI w/meals.  - Diet added back yesterday evening, will resume long acting insulin today - Monitor CBGs - Semglee 20 units at night, SSI  -- Continue gabapentin 800 mg BID, 1200 mg qHS (takes 3600 mg qHS at home), lyrica 300 mg qHS -- Continue Farxiga

## 2022-03-11 NOTE — Progress Notes (Signed)
PT Cancellation Note  Patient Details Name: Austin Vaughan MRN: [id number] DOB: June 27, 1971   Cancelled Treatment:    Reason Eval/Treat Not Completed: Patient at procedure or test/unavailable. Pt had operation today and awaiting details including postop orders. Physical therapy will continue to follow.  Tana Coast, PT    Tana Coast 03/11/2022, 3:51 PM

## 2022-03-11 NOTE — Anesthesia Postprocedure Evaluation (Signed)
Anesthesia Post Note  Patient: Austin Vaughan  Procedure(s) Performed: GREAT TOE AMPUTATION (Left: Toe)     Patient location during evaluation: PACU Anesthesia Type: Regional Level of consciousness: awake Pain management: pain level controlled Vital Signs Assessment: post-procedure vital signs reviewed and stable Respiratory status: spontaneous breathing, nonlabored ventilation, respiratory function stable and patient connected to nasal cannula oxygen Cardiovascular status: stable and blood pressure returned to baseline Postop Assessment: no apparent nausea or vomiting Anesthetic complications: no   No notable events documented.  Last Vitals:  Vitals:   03/11/22 1559 03/11/22 2019  BP: 122/74 124/70  Pulse: 75 78  Resp: 16 18  Temp: 36.7 C 36.7 C  SpO2: 95% 95%    Last Pain:  Vitals:   03/11/22 2019  TempSrc: Oral  PainSc:                  Catheryn Bacon Jermya Dowding

## 2022-03-11 NOTE — Progress Notes (Signed)
Pacu RN Report to floor given  Gave report to [employer]. Room: [phone]   Discussed surgery, meds given in OR and Pacu, VS, IV fluids given, EBL, urine output, pain and other pertinent information. Also discussed if pt had any family or friends here or belongings with them.   VSS, pain was 4/10, now 2/10, gave Oxy and Toradol for pain. +CSM to L toes remaining, <3 sec cap refill, foot up on pillow, ice to area.   Pt exits my care.

## 2022-03-11 NOTE — Transfer of Care (Signed)
Immediate Anesthesia Transfer of Care Note  Patient: Austin Vaughan  Procedure(s) Performed: GREAT TOE AMPUTATION (Left: Toe)  Patient Location: PACU  Anesthesia Type:MAC  Level of Consciousness: awake, alert  and oriented  Airway & Oxygen Therapy: Patient connected to face mask oxygen  Post-op Assessment: Post -op Vital signs reviewed and stable  Post vital signs: stable  Last Vitals:  Vitals Value Taken Time  BP 124/71 03/11/22 1327  Temp    Pulse 73 03/11/22 1328  Resp 16 03/11/22 1328  SpO2 98 % 03/11/22 1328  Vitals shown include unvalidated device data.  Last Pain:  Vitals:   03/11/22 1230  TempSrc:   PainSc: 0-No pain         Complications: No notable events documented.

## 2022-03-11 NOTE — Progress Notes (Signed)
Orthopedic Tech Progress Note Patient Details:  Austin Vaughan 15-Jul-1971 [id number]  Ortho Devices Type of Ortho Device: Postop shoe/boot Ortho Device/Splint Location: LLE Ortho Device/Splint Interventions: Ordered, Adjustment      Austin Vaughan E Austin Vaughan 03/11/2022, 2:41 PM

## 2022-03-11 NOTE — Anesthesia Preprocedure Evaluation (Addendum)
Anesthesia Evaluation  Patient identified by MRN, date of birth, ID band Patient awake    Reviewed: Allergy & Precautions, NPO status , Patient's Chart, lab work & pertinent test results  Airway Mallampati: III  TM Distance: >3 FB Neck ROM: Full    Dental no notable dental hx.    Pulmonary asthma , sleep apnea and Continuous Positive Airway Pressure Ventilation ,    Pulmonary exam normal        Cardiovascular hypertension, + CAD, + Cardiac Stents, + Peripheral Vascular Disease and +CHF  Normal cardiovascular exam     Neuro/Psych PSYCHIATRIC DISORDERS Anxiety Depression  Neuromuscular disease    GI/Hepatic Neg liver ROS, GERD  Medicated and Controlled,  Endo/Other  diabetes, Insulin Dependent  Renal/GU negative Renal ROS     Musculoskeletal   Abdominal   Peds  Hematology negative hematology ROS (+)   Anesthesia Other Findings LEFT FOOT INFECTION  Reproductive/Obstetrics                            Anesthesia Physical Anesthesia Plan  ASA: 3  Anesthesia Plan: Regional   Post-op Pain Management: Regional block*   Induction: Intravenous  PONV Risk Score and Plan: 1 and Ondansetron, Dexamethasone, Midazolam, Propofol infusion and Treatment may vary due to age or medical condition  Airway Management Planned: Mask  Additional Equipment:   Intra-op Plan:   Post-operative Plan:   Informed Consent: I have reviewed the patients History and Physical, chart, labs and discussed the procedure including the risks, benefits and alternatives for the proposed anesthesia with the patient or authorized representative who has indicated his/her understanding and acceptance.     Dental advisory given  Plan Discussed with: CRNA  Anesthesia Plan Comments:         Anesthesia Quick Evaluation

## 2022-03-11 NOTE — H&P (Signed)
History and Physical Interval Note:  03/11/2022 12:46 PM  Austin Vaughan  has presented today for surgery, with the diagnosis of left hallux infection.  The various methods of treatment have been discussed with the patient and family. After consideration of risks, benefits and other options for treatment, the patient has consented to   Procedure(s): GREAT TOE AMPUTATION (Left) as a surgical intervention.  [name] history has been reviewed, patient examined, no change in status, stable for surgery.  I have reviewed the patient's chart and labs.  Questions were answered to the patient's satisfaction.     Edwin Cap

## 2022-03-11 NOTE — Anesthesia Procedure Notes (Signed)
Anesthesia Regional Block: Popliteal block   Pre-Anesthetic Checklist: , timeout performed,  Correct Patient, Correct Site, Correct Laterality,  Correct Procedure, Correct Position, site marked,  Risks and benefits discussed,  Surgical consent,  Pre-op evaluation,  At surgeon's request and post-op pain management  Laterality: Left  Prep: chloraprep       Needles:  Injection technique: Single-shot  Needle Type: Echogenic Stimulator Needle     Needle Length: 10cm  Needle Gauge: 20     Additional Needles:   Procedures:,,,, ultrasound used (permanent image in chart),,    Narrative:  Start time: 03/11/2022 12:10 PM End time: 03/11/2022 12:20 PM Injection made incrementally with aspirations every 5 mL.  Performed by: Personally  Anesthesiologist: Leonides Grills, MD  Additional Notes: Functioning IV was confirmed and monitors were applied.  A timeout was performed. Sterile prep, hand hygiene and sterile gloves were used. A 20ga Bbraun echogenic stimulator needle was used. Negative aspiration and negative test dose prior to incremental administration of local anesthetic. The patient tolerated the procedure well.  Ultrasound guidance: relevent anatomy identified, needle position confirmed, local anesthetic spread visualized around nerve(s), vascular puncture avoided.  Image printed for medical record.

## 2022-03-11 NOTE — Op Note (Signed)
Patient Name: Austin Vaughan DOB: [birth year]  MRN: [id number]   Date of Service: 03/09/2022 - 03/11/2022  Surgeon: Dr. Lanae Crumbly, DPM Assistants: None Pre-operative Diagnosis:  Gangrene of left great toe Post-operative Diagnosis:  Gangrene of left great toe Procedures:  1) Amputation toe left 1st Pathology/Specimens: ID Type Source Tests Collected by Time Destination  1 : Left great toe. Tissue PATH Gross Only SURGICAL PATHOLOGY Criselda Peaches, [place] 03/11/2022 1331    Anesthesia: MAC with local Hemostasis:  Total Tourniquet Time Documented: Calf (Left) - 11 minutes Total: Calf (Left) - 11 minutes  Estimated Blood Loss: 5 mL Materials: * No implants in log * Medications: 10cc 0.5% marcaine plain Complications: none  Indications for Procedure:  This is a 51 y.o. male with a history of type 2 diabetes with infection of the right great toe.    Procedure in Detail: Patient was identified in pre-operative holding area. Formal consent was signed and the left lower extremity was marked. Patient was brought back to the operating room. Anesthesia was induced. The extremity was prepped and draped in the usual sterile fashion. Timeout was taken to confirm patient name, laterality, and procedure prior to incision.   Attention was then directed to the left 1st toe where an incision was made in a fishmouth style incision. Dissection was carried down to level of bone.  Dissection was continued to the metatarsophalangeal joint and all collateral ligaments were freed at the joint.  The bone soft tissue attachments of the proximal phalanx were removed and passed for pathology.  The remaining metatarsal head appeared healthy and viable.  The area was copiously irrigated.  The skin was reapproximated with monocryl, nylon and skin staples.   The foot was then dressed with xeroform and dry sterile dressings. Patient tolerated the procedure well.   Disposition: Following a period of post-operative  monitoring, patient will be transferred to the floor.

## 2022-03-11 NOTE — Progress Notes (Signed)
Held 1000 meds, except IV antibiotics and metoprolol d/t surgery today

## 2022-03-11 NOTE — Progress Notes (Signed)
Patient got up to use the bathroom. Incision started to bleed. I applied pressure for a few minutes, then reinforced with ABD pad and ace wrap.

## 2022-03-11 NOTE — Plan of Care (Signed)
  Problem: Education: Goal: Knowledge of General Education information will improve Description Including pain rating scale, medication(s)/side effects and non-pharmacologic comfort measures Outcome: Progressing   Problem: Health Behavior/Discharge Planning: Goal: Ability to manage health-related needs will improve Outcome: Progressing   

## 2022-03-11 NOTE — Telephone Encounter (Signed)
Patient was seen in hospital already

## 2022-03-12 ENCOUNTER — Encounter: Payer: Self-pay | Admitting: Podiatry

## 2022-03-12 ENCOUNTER — Encounter (HOSPITAL_COMMUNITY): Payer: Self-pay | Admitting: Podiatry

## 2022-03-12 DIAGNOSIS — M869 Osteomyelitis, unspecified: Secondary | ICD-10-CM | POA: Diagnosis not present

## 2022-03-12 DIAGNOSIS — I96 Gangrene, not elsewhere classified: Secondary | ICD-10-CM

## 2022-03-12 DIAGNOSIS — Z794 Long term (current) use of insulin: Secondary | ICD-10-CM | POA: Diagnosis not present

## 2022-03-12 DIAGNOSIS — E1142 Type 2 diabetes mellitus with diabetic polyneuropathy: Secondary | ICD-10-CM | POA: Diagnosis not present

## 2022-03-12 LAB — CBC WITH DIFFERENTIAL/PLATELET
Abs Immature Granulocytes: 0.04 10*3/uL (ref 0.00–0.07)
Basophils Absolute: 0 10*3/uL (ref 0.0–0.1)
Basophils Relative: 0 %
Eosinophils Absolute: 0 10*3/uL (ref 0.0–0.5)
Eosinophils Relative: 0 %
HCT: 39.7 % (ref 39.0–52.0)
Hemoglobin: 12.6 g/dL — ABNORMAL LOW (ref 13.0–17.0)
Immature Granulocytes: 1 %
Lymphocytes Relative: 11 %
Lymphs Abs: 1 10*3/uL (ref 0.7–4.0)
MCH: 26.9 pg (ref 26.0–34.0)
MCHC: 31.7 g/dL (ref 30.0–36.0)
MCV: 84.8 fL (ref 80.0–100.0)
Monocytes Absolute: 0.4 10*3/uL (ref 0.1–1.0)
Monocytes Relative: 5 %
Neutro Abs: 7.4 10*3/uL (ref 1.7–7.7)
Neutrophils Relative %: 83 %
Platelets: 307 10*3/uL (ref 150–400)
RBC: 4.68 MIL/uL (ref 4.22–5.81)
RDW: 12.7 % (ref 11.5–15.5)
WBC: 8.8 10*3/uL (ref 4.0–10.5)
nRBC: 0 % (ref 0.0–0.2)

## 2022-03-12 LAB — GLUCOSE, CAPILLARY
Glucose-Capillary: 185 mg/dL — ABNORMAL HIGH (ref 70–99)
Glucose-Capillary: 226 mg/dL — ABNORMAL HIGH (ref 70–99)

## 2022-03-12 LAB — BASIC METABOLIC PANEL
Anion gap: 9 (ref 5–15)
BUN: 16 mg/dL (ref 6–20)
CO2: 21 mmol/L — ABNORMAL LOW (ref 22–32)
Calcium: 8.5 mg/dL — ABNORMAL LOW (ref 8.9–10.3)
Chloride: 105 mmol/L (ref 98–111)
Creatinine, Ser: 0.69 mg/dL (ref 0.61–1.24)
GFR, Estimated: >60 mL/min (ref >60)
Glucose, Bld: 240 mg/dL — ABNORMAL HIGH (ref 70–99)
Potassium: 4.2 mmol/L (ref 3.5–5.1)
Sodium: 135 mmol/L (ref 135–145)

## 2022-03-12 LAB — MAGNESIUM: Magnesium: 2.3 mg/dL (ref 1.7–2.4)

## 2022-03-12 MED ORDER — ENOXAPARIN SODIUM 40 MG/0.4ML IJ SOSY
40.0000 mg | PREFILLED_SYRINGE | INTRAMUSCULAR | Status: DC
  Administered 2022-03-12: 40 mg via SUBCUTANEOUS
  Filled 2022-03-12: qty 0.4

## 2022-03-12 MED ORDER — INSULIN GLARGINE-YFGN 100 UNIT/ML ~~LOC~~ SOLN
20.0000 [IU] | Freq: Every day | SUBCUTANEOUS | Status: DC
  Filled 2022-03-12: qty 0.2

## 2022-03-12 MED ORDER — GABAPENTIN 600 MG PO TABS: ORAL_TABLET | ORAL | 0 refills | Status: DC

## 2022-03-12 MED ORDER — LANTUS SOLOSTAR 100 UNIT/ML ~~LOC~~ SOPN: 25.0000 [IU] | PEN_INJECTOR | Freq: Every day | SUBCUTANEOUS | 3 refills | Status: DC

## 2022-03-12 MED ORDER — OXYCODONE HCL 5 MG PO TABS: 5.0000 mg | ORAL_TABLET | ORAL | 0 refills | Status: DC | PRN

## 2022-03-12 MED ORDER — SULFAMETHOXAZOLE-TRIMETHOPRIM 800-160 MG PO TABS
1.0000 | ORAL_TABLET | Freq: Two times a day (BID) | ORAL | Status: DC
  Administered 2022-03-12: 1 via ORAL
  Filled 2022-03-12: qty 1

## 2022-03-12 MED ORDER — PREGABALIN 300 MG PO CAPS: 300.0000 mg | ORAL_CAPSULE | Freq: Every day | ORAL | 2 refills | Status: AC

## 2022-03-12 MED ORDER — SULFAMETHOXAZOLE-TRIMETHOPRIM 800-160 MG PO TABS: 1.0000 | ORAL_TABLET | Freq: Two times a day (BID) | ORAL | 0 refills | Status: DC

## 2022-03-12 NOTE — Progress Notes (Signed)
Nsg Discharge Note  Admit Date:  03/09/2022 Discharge date: 03/12/2022   Austin Vaughan to be D/C'd Home per MD order.  AVS completed.  Removed IV-CDI. Reviewed d/c paperwork with patient and answered all posed questions. Wheeled stable patient and belongings including new walker to main entrance where he was picked up by his wife. Patient/caregiver able to verbalize understanding.  Discharge Medication: Allergies as of 03/12/2022       Reactions   Ambien [zolpidem Tartrate] Other (See Comments)   HALLUCINATIONS   Shellfish Allergy Anaphylaxis   Seafood        Medication List     TAKE these medications    albuterol 108 (90 Base) MCG/ACT inhaler Commonly known as: VENTOLIN HFA TAKE 2 PUFFS BY MOUTH EVERY 6 HOURS AS NEEDED FOR WHEEZE OR SHORTNESS OF BREATH What changed: See the new instructions.   aspirin EC 81 MG tablet Take 81 mg by mouth daily.   atorvastatin 80 MG tablet Commonly known as: LIPITOR Take 1 tablet (80 mg total) by mouth daily.   cetirizine 10 MG tablet Commonly known as: ZYRTEC Take 1 tablet (10 mg total) by mouth daily.   dapagliflozin propanediol 10 MG Tabs tablet Commonly known as: Farxiga Take 1 tablet (10 mg total) by mouth daily.   dicyclomine 20 MG tablet Commonly known as: BENTYL TAKE 1 TABLET (20 MG TOTAL) BY MOUTH 4 (FOUR) TIMES DAILY AS NEEDED (ABDOMINAL CRAMPING).   EPINEPHrine 0.15 MG/0.3ML injection Commonly known as: EPIPEN JR Inject 0.15 mg into the muscle as needed for anaphylaxis.   gabapentin 600 MG tablet Commonly known as: NEURONTIN Take 1 tablet (600 mg total) by mouth 2 (two) times daily AND 2 tablets (1,200 mg total) at bedtime. What changed: See the new instructions.   hydrOXYzine 25 MG capsule Commonly known as: VISTARIL Take 1 capsule (25 mg total) by mouth every 8 (eight) hours as needed for itching.   Lantus SoloStar 100 UNIT/ML Solostar Pen Generic drug: insulin glargine Inject 25 Units into the skin daily. What  changed: how much to take   metFORMIN 1000 MG tablet Commonly known as: GLUCOPHAGE Take 1 tablet (1,000 mg total) by mouth 2 (two) times daily with a meal.   metoprolol succinate 100 MG 24 hr tablet Commonly known as: TOPROL-XL Take 1 tablet (100 mg total) by mouth daily. Take with or immediately following a meal. What changed: additional instructions   montelukast 10 MG tablet Commonly known as: SINGULAIR Take 1 tablet (10 mg total) by mouth daily.   nitroGLYCERIN 0.4 MG SL tablet Commonly known as: NITROSTAT PLACE 1 TABLET UNDER THE TONGUE EVERY 5 MINUTES AS NEEDED FOR CHEST PAIN. What changed:  how much to take how to take this when to take this reasons to take this additional instructions   nitroGLYCERIN 0.4 mg/hr patch Commonly known as: NITRODUR - Dosed in mg/24 hr Place 1 patch (0.4 mg total) onto the skin daily. What changed: Another medication with the same name was changed. Make sure you understand how and when to take each.   nortriptyline 50 MG capsule Commonly known as: PAMELOR TAKE 1 CAPSULE BY MOUTH EVERYDAY AT BEDTIME What changed: See the new instructions.   NovoLOG FlexPen 100 UNIT/ML FlexPen Generic drug: insulin aspart Max daily 102 units What changed:  how much to take how to take this when to take this additional instructions   oxyCODONE 5 MG immediate release tablet Commonly known as: Oxy IR/ROXICODONE Take 1 tablet (5 mg total) by mouth  every 4 (four) hours as needed for up to 6 doses for severe pain.   pantoprazole 40 MG tablet Commonly known as: PROTONIX Take 1 tablet (40 mg total) by mouth daily.   pregabalin 300 MG capsule Commonly known as: LYRICA Take 1 capsule (300 mg total) by mouth daily. What changed: when to take this   ramipril 5 MG capsule Commonly known as: ALTACE TAKE 2 CAPSULES (10 MG TOTAL) BY MOUTH DAILY. What changed:  how much to take how to take this when to take this additional instructions   rOPINIRole 4  MG tablet Commonly known as: REQUIP TAKE 1 TABLET BY MOUTH EVERYDAY AT BEDTIME What changed:  how much to take how to take this when to take this additional instructions   sulfamethoxazole-trimethoprim 800-160 MG tablet Commonly known as: BACTRIM DS Take 1 tablet by mouth every 12 (twelve) hours.               Durable Medical Equipment  (From admission, onward)           Start     Ordered   03/12/22 1538  For home use only DME Walker rolling  Once       Question Answer Comment  Walker: With 5 Inch Wheels   Patient needs a walker to treat with the following condition Decreased functional mobility and endurance      03/12/22 1538            Discharge Assessment: Vitals:   03/12/22 0814 03/12/22 1547  BP: 140/77 120/76  Pulse: 75 67  Resp: 16 17  Temp: (!) 97.5 F (36.4 C) (!) 97.3 F (36.3 C)  SpO2: 97% 98%   Skin clean, dry and intact without evidence of skin break down, no evidence of skin tears noted. IV catheter discontinued intact. Site without signs and symptoms of complications - no redness or edema noted at insertion site, patient denies c/o pain - only slight tenderness at site.  Dressing with slight pressure applied.  D/c Instructions-Education: Discharge instructions given to patient/family with verbalized understanding. D/c education completed with patient/family including follow up instructions, medication list, d/c activities limitations if indicated, with other d/c instructions as indicated by MD - patient able to verbalize understanding, all questions fully answered. Patient instructed to return to ED, call 911, or call MD for any changes in condition.  Patient escorted via WC, and D/C home via private auto.  Karolee Ohs, RN 03/12/2022 5:55 PM

## 2022-03-12 NOTE — Plan of Care (Signed)
  Problem: Education: Goal: Knowledge of General Education information will improve Description Including pain rating scale, medication(s)/side effects and non-pharmacologic comfort measures Outcome: Progressing   Problem: Health Behavior/Discharge Planning: Goal: Ability to manage health-related needs will improve Outcome: Progressing   

## 2022-03-12 NOTE — Progress Notes (Signed)
     Daily Progress Note Intern Pager: [phone]  Patient name: Austin Vaughan Medical record number: [id number] Date of birth: 1971-05-14 Age: 51 y.o. Gender: male  Primary Care Provider: Storm Frisk, MD Consultants: Podiatry Code Status: Full  Pt Overview and Major Events to Date:  7/27 - admitted, MRI with reactive osteitis, and possible septic arthritis  7/29 - Podiatry performed L hallux amputation  Assessment and Plan:  * Osteitis Stable. Left hallux amputation performed yesterday. Hgb stable this am. Patient feeling well.  -Podiatry following, appreciate recs -Continue vanc and cefepime (pharmacy dosing), flagyl -Continue tylenol, and Oxy IR 5mg  q4h for pain   Essential hypertension BP stable. - Continue Farxiga, metoprolol, ramipril - Continue to monitor  Type 2 diabetes mellitus with diabetic polyneuropathy, with long-term current use of insulin (HCC) AM glucose 240. Home Insulin regimen: qHS lantus 40, SSI w/meals.  - Diet added back yesterday evening, will resume long acting insulin today - Monitor CBGs - Semglee 20 units at night, SSI  -- Continue gabapentin 800 mg BID, 1200 mg qHS (takes 3600 mg qHS at home), lyrica 300 mg qHS -- Continue Farxiga    Chronic Conditions:  CAD (MI 07/2022 w/ stent) - cont. Atorvastatin 80 mg, aspirin 81 mg, metoprolol 100 mg, Ramipril 5 mg, nitroglycerin patch daily  Restless leg - Requip 4 mg daily  GERD - Protonix 40 mg daily  Seasonal Allergies - Claritin 10 mg, Singulair 10 mg    FEN/GI: carb modified heart healthy diet  PPx: Lovenox  Dispo:Home tomorrow. Barriers include post op care.   Subjective:  No acute events overnight. Patient sleeping comfortably when I came into the room. States his pain has been pretty controlled did take an Oxy early in the morning. He feels he is still having peripheral neuropathy at night.   Objective: Temp:  [97.4 F (36.3 C)-98.4 F (36.9 C)] 98.1 F (36.7 C) (07/30 0439) Pulse  Rate:  [66-95] 73 (07/30 0439) Resp:  [12-18] 16 (07/30 0439) BP: (118-154)/(70-86) 123/75 (07/30 0439) SpO2:  [94 %-100 %] 96 % (07/30 0439) Physical Exam: General: alert, NAD Cardiovascular: RRR no murmurs  Respiratory: CTAB normal WOB  Abdomen: soft, non distended  Extremities: warm, dry. L foot wrapped in bandage without drainage or bleeding   Laboratory: Most recent CBC Lab Results  Component Value Date   WBC 8.8 03/12/2022   HGB 12.6 (L) 03/12/2022   HCT 39.7 03/12/2022   MCV 84.8 03/12/2022   PLT 307 03/12/2022   Most recent BMP    Latest Ref Rng & Units 03/12/2022    1:38 AM  BMP  Glucose 70 - 99 mg/dL 03/14/2022   BUN 6 - 20 mg/dL 16   Creatinine 206 - 1.24 mg/dL 0.15   Sodium 6.15 - 379 mmol/L 135   Potassium 3.5 - 5.1 mmol/L 4.2   Chloride 98 - 111 mmol/L 105   CO2 22 - 32 mmol/L 21   Calcium 8.9 - 10.3 mg/dL 8.5      Imaging/Diagnostic Tests:  None new   432, DO 03/12/2022, 6:53 AM  PGY-3, [address] [facility] FPTS Intern pager: [phone], text pages welcome Secure chat group [facility] Teaching Service

## 2022-03-12 NOTE — TOC Transition Note (Addendum)
Transition of Care [facility] - CM/SW Discharge Note   Patient Details  Name: Austin Vaughan MRN: [id number] Date of Birth: 04/25/1971  [facility]) CM/SW Contact:  Bess Kinds, RN Phone Number: [phone] 03/12/2022, 2:33 PM   Clinical Narrative:     Patient to transition home today. No TOC needs identified at this time.   Update: PT recommendations for RW. Referral sent to [facility] for delivery to the room.   Final next level of care: Home/Self Care Barriers to Discharge: No Barriers Identified   Patient Goals and CMS Choice        Discharge Placement                       Discharge Plan and Services                                     Social Determinants of Health (SDOH) Interventions     Readmission Risk Interventions     No data to display

## 2022-03-12 NOTE — Discharge Summary (Addendum)
Family Medicine Teaching [facility] Discharge Summary  Patient name: Austin Vaughan Medical record number: [id number] Date of birth: 03-09-1971 Age: 51 y.o. Gender: male Date of Admission: 03/09/2022  Date of Discharge: 03/12/2022 Admitting Physician: Cora Collum, DO  Primary Care Provider: Storm Frisk, MD Consultants: Podiatry  Indication for Hospitalization: Osteitis  Brief Hospital Course:  MAUREEN DUESING is a 51 y.o. male who presented with L foot ulcer and osteitis. 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 A brief hospital course is listed below.   Osteitis with foot ulcer Presented with progressive ulceration to L great toe. Afebrile, VSS. LA 2>1.8. Bcx collected, NGTD. XR without osteomyelitis, MRI with osteitis. IV vanc, cefepime, and flagyl started. Tylenol with pain. Podiatry performed amputation on 7/29. Pain was controlled with oxycodone. Patient was well postop and discharged on 10 days of bactrim with follow up arranged by podiatry and with instructions to follow up with PCP.   Type 2 diabetes mellitus with hyperglycemia, with long-term current use of insulin (HCC) CBGs 100s over admission though increased to 200s with dexamethasone in OR. Given SSI and semglee 20 units (home SSI and lantus 40 units) with home [place]. Held metformin. Gabapentin and pregabalin doses were reduced on discharge. He was discharged on 25 units lantus with ability to titrate up as needed.   Other chronic conditions: HTN - stable on home metoprolol, ramipril CAD- Hx MI 07/2012 with stent placement. Follows with cardiology outpatient. Continue Atorvastatin 80 mg daily, Aspirin 81mg , Metoprolol 100 mg daily Ramipril 5 mg daily nitroglycerin patch daily for chest pain Chronic systolic and diastolic CHF- (EF 55-60%) Sleep apnea- CPAP machine at home  Restless leg- Requip 4mg  daily  GERD- Protonix 40mg   daily  Seasonal allergies- Zyrtec 10mg , Singulair 10mg    Issues for follow up: Wound healing, abx course Lantus decreased to 25 units daily, titrate up as needed Ensure f/u with podiatry Gabapentin and lyrica dosing - encourage spreading out of gabapentin dosage rather than 3600 mg QHS and removing lyrica given similar mechanism of action  Discharge Diagnoses/Problem List:  Principal Problem for Admission: osteitis Other Problems addressed during stay:  Present on Admission:  Osteitis  Essential hypertension  Foot ulcer (HCC)   Disposition: Home  Discharge Condition: Stable  Discharge Exam (performed by Dr. ): Blood pressure 140/77, pulse 75, temperature (!) 97.5 F (36.4 C), temperature source Oral, resp. rate 16, SpO2 97 %.  General: alert, NAD Cardiovascular: RRR no murmurs  Respiratory: CTAB normal WOB  Abdomen: soft, non distended  Extremities: warm, dry. L foot wrapped in bandage without drainage or bleeding.  Significant Procedures: Amputation of L great toe 7/29  Significant Labs and Imaging:  Recent Labs  Lab 03/11/22 0057 03/12/22 0138  WBC 8.1 8.8  HGB 12.5* 12.6*  HCT 39.9 39.7  PLT 275 307   Recent Labs  Lab 03/11/22 0057 03/12/22 0138  NA 136 135  K 4.0 4.2  CL 105 105  CO2 22 21*  GLUCOSE 187* 240*  BUN 13 16  CREATININE 0.73 0.69  CALCIUM 8.9 8.5*  MG  --  2.3   XR Foot IMPRESSION: Soft tissue ulcer along the medial aspect of the 1st distal phalanx. No radiographic findings of osteomyelitis.  MRI L Foot IMPRESSION: 1. Shallow soft tissue ulceration along the medial aspect of the great toe with associated skin thickening and soft tissue edema. No well-defined fluid  collections. 2. Faint bone marrow edema within the proximal and distal phalanx of the great toe, most pronounced at the head of the great toe proximal phalanx. Preservation of the fatty T1 marrow signal. Findings are favored to represent reactive osteitis  rather than early acute osteomyelitis at this time. 3. Small joint effusion of the great toe IP joint, nonspecific and likely reactive although septic arthritis is not excluded.  Results/Tests Pending at Time of Discharge: None  Discharge Medications:  Allergies as of 03/12/2022       Reactions   Ambien [zolpidem Tartrate] Other (See Comments)   HALLUCINATIONS   Shellfish Allergy Anaphylaxis   Seafood        Medication List     TAKE these medications    albuterol 108 (90 Base) MCG/ACT inhaler Commonly known as: VENTOLIN HFA TAKE 2 PUFFS BY MOUTH EVERY 6 HOURS AS NEEDED FOR WHEEZE OR SHORTNESS OF BREATH What changed: See the new instructions.   aspirin EC 81 MG tablet Take 81 mg by mouth daily.   atorvastatin 80 MG tablet Commonly known as: LIPITOR Take 1 tablet (80 mg total) by mouth daily.   cetirizine 10 MG tablet Commonly known as: ZYRTEC Take 1 tablet (10 mg total) by mouth daily.   dapagliflozin propanediol 10 MG Tabs tablet Commonly known as: Farxiga Take 1 tablet (10 mg total) by mouth daily.   dicyclomine 20 MG tablet Commonly known as: BENTYL TAKE 1 TABLET (20 MG TOTAL) BY MOUTH 4 (FOUR) TIMES DAILY AS NEEDED (ABDOMINAL CRAMPING).   EPINEPHrine 0.15 MG/0.3ML injection Commonly known as: EPIPEN JR Inject 0.15 mg into the muscle as needed for anaphylaxis.   gabapentin 600 MG tablet Commonly known as: NEURONTIN Take 1 tablet (600 mg total) by mouth 2 (two) times daily AND 2 tablets (1,200 mg total) at bedtime. What changed: See the new instructions.   hydrOXYzine 25 MG capsule Commonly known as: VISTARIL Take 1 capsule (25 mg total) by mouth every 8 (eight) hours as needed for itching.   Lantus SoloStar 100 UNIT/ML Solostar Pen Generic drug: insulin glargine Inject 25 Units into the skin daily. What changed: how much to take   metFORMIN 1000 MG tablet Commonly known as: GLUCOPHAGE Take 1 tablet (1,000 mg total) by mouth 2 (two) times daily with  a meal.   metoprolol succinate 100 MG 24 hr tablet Commonly known as: TOPROL-XL Take 1 tablet (100 mg total) by mouth daily. Take with or immediately following a meal. What changed: additional instructions   montelukast 10 MG tablet Commonly known as: SINGULAIR Take 1 tablet (10 mg total) by mouth daily.   nitroGLYCERIN 0.4 MG SL tablet Commonly known as: NITROSTAT PLACE 1 TABLET UNDER THE TONGUE EVERY 5 MINUTES AS NEEDED FOR CHEST PAIN. What changed:  how much to take how to take this when to take this reasons to take this additional instructions   nitroGLYCERIN 0.4 mg/hr patch Commonly known as: NITRODUR - Dosed in mg/24 hr Place 1 patch (0.4 mg total) onto the skin daily. What changed: Another medication with the same name was changed. Make sure you understand how and when to take each.   nortriptyline 50 MG capsule Commonly known as: PAMELOR TAKE 1 CAPSULE BY MOUTH EVERYDAY AT BEDTIME What changed: See the new instructions.   NovoLOG FlexPen 100 UNIT/ML FlexPen Generic drug: insulin aspart Max daily 102 units What changed:  how much to take how to take this when to take this additional instructions   oxyCODONE 5 MG  immediate release tablet Commonly known as: Oxy IR/ROXICODONE Take 1 tablet (5 mg total) by mouth every 4 (four) hours as needed for up to 6 doses for severe pain.   pantoprazole 40 MG tablet Commonly known as: PROTONIX Take 1 tablet (40 mg total) by mouth daily.   pregabalin 300 MG capsule Commonly known as: LYRICA Take 1 capsule (300 mg total) by mouth daily. What changed: when to take this   ramipril 5 MG capsule Commonly known as: ALTACE TAKE 2 CAPSULES (10 MG TOTAL) BY MOUTH DAILY. What changed:  how much to take how to take this when to take this additional instructions   rOPINIRole 4 MG tablet Commonly known as: REQUIP TAKE 1 TABLET BY MOUTH EVERYDAY AT BEDTIME What changed:  how much to take how to take this when to take  this additional instructions   sulfamethoxazole-trimethoprim 800-160 MG tablet Commonly known as: BACTRIM DS Take 1 tablet by mouth every 12 (twelve) hours.        Discharge Instructions: Please refer to Patient Instructions section of EMR for full details.  Patient was counseled important signs and symptoms that should prompt return to medical care, changes in medications, dietary instructions, activity restrictions, and follow up appointments.   Follow-Up Appointments:  Follow-up Information     Storm Frisk, MD .   Specialty: Pulmonary Disease Contact information: [address] [place] [address] [phone]         Chilton Si, MD .   Specialty: Cardiology Contact information: [address] [place] [address] [phone]                 Janeal Holmes, MD 03/12/2022, 2:34 PM PGY-1, [facility]   I was personally present and performed or re-performed the history, physical exam and medical decision making activities of this service and have verified that the service and findings are accurately documented in the resident's note.  Littie Deeds, MD                  03/12/2022, 2:38 PM

## 2022-03-12 NOTE — Plan of Care (Signed)
  Problem: Education: Goal: Knowledge of General Education information will improve Description: Including pain rating scale, medication(s)/side effects and non-pharmacologic comfort measures 03/12/2022 1752 by Karolee Ohs, RN Outcome: Adequate for Discharge 03/12/2022 1231 by Karolee Ohs, RN Outcome: Progressing   Problem: Health Behavior/Discharge Planning: Goal: Ability to manage health-related needs will improve 03/12/2022 1752 by Karolee Ohs, RN Outcome: Adequate for Discharge 03/12/2022 1231 by Karolee Ohs, RN Outcome: Progressing   Problem: Clinical Measurements: Goal: Ability to maintain clinical measurements within normal limits will improve Outcome: Adequate for Discharge Goal: Will remain free from infection Outcome: Adequate for Discharge Goal: Diagnostic test results will improve Outcome: Adequate for Discharge Goal: Respiratory complications will improve Outcome: Adequate for Discharge Goal: Cardiovascular complication will be avoided Outcome: Adequate for Discharge   Problem: Nutrition: Goal: Adequate nutrition will be maintained Outcome: Adequate for Discharge   Problem: Coping: Goal: Level of anxiety will decrease Outcome: Adequate for Discharge   Problem: Elimination: Goal: Will not experience complications related to bowel motility Outcome: Adequate for Discharge Goal: Will not experience complications related to urinary retention Outcome: Adequate for Discharge   Problem: Pain Managment: Goal: General experience of comfort will improve Outcome: Adequate for Discharge   Problem: Safety: Goal: Ability to remain free from injury will improve Outcome: Adequate for Discharge   Problem: Skin Integrity: Goal: Risk for impaired skin integrity will decrease Outcome: Adequate for Discharge   Problem: Acute Rehab OT Goals (only OT should resolve) Goal: Pt. Will Transfer To Toilet Outcome: Adequate for Discharge Goal: Pt. Will Perform Tub/Shower  Transfer Outcome: Adequate for Discharge Goal: OT Additional ADL Goal #1 Outcome: Adequate for Discharge   Problem: Education: Goal: Ability to describe self-care measures that may prevent or decrease complications (Diabetes Survival Skills Education) will improve Outcome: Adequate for Discharge Goal: Individualized Educational Video(s) Outcome: Adequate for Discharge   Problem: Coping: Goal: Ability to adjust to condition or change in health will improve Outcome: Adequate for Discharge   Problem: Fluid Volume: Goal: Ability to maintain a balanced intake and output will improve Outcome: Adequate for Discharge   Problem: Health Behavior/Discharge Planning: Goal: Ability to identify and utilize available resources and services will improve Outcome: Adequate for Discharge Goal: Ability to manage health-related needs will improve Outcome: Adequate for Discharge   Problem: Metabolic: Goal: Ability to maintain appropriate glucose levels will improve Outcome: Adequate for Discharge   Problem: Nutritional: Goal: Maintenance of adequate nutrition will improve Outcome: Adequate for Discharge Goal: Progress toward achieving an optimal weight will improve Outcome: Adequate for Discharge   Problem: Skin Integrity: Goal: Risk for impaired skin integrity will decrease Outcome: Adequate for Discharge   Problem: Tissue Perfusion: Goal: Adequacy of tissue perfusion will improve Outcome: Adequate for Discharge

## 2022-03-12 NOTE — Evaluation (Signed)
Physical Therapy Re-Evaluation and Discharge Patient Details Name: Austin Vaughan MRN: [id number] DOB: [birth year] Today's Date: 03/12/2022  History of Present Illness  Pt is a 51 y.o. male presenting with L foot ulcer with c/f osteomyelitis. 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  Clinical Impression  Pt re-evaluated s/p left first toe amputation. Education provided regarding weightbearing restrictions, post op shoe use, and activity recommendations. Pt modI with ambulation using a RW vs driving knee scooter; pt with preference for RW for mobility. Encouraged wearing a shoe on right foot as well to improve ease of off loading. Pt with no further questions/concerns. Thank you for this consult.      Recommendations for follow up therapy are one component of a multi-disciplinary discharge planning process, led by the attending physician.  Recommendations may be updated based on patient status, additional functional criteria and insurance authorization.  Follow Up Recommendations No PT follow up      Assistance Recommended at Discharge PRN  Patient can return home with the following  Assist for transportation;Help with stairs or ramp for entrance    Equipment Recommendations Rolling walker (2 wheels)  Recommendations for Other Services       Functional Status Assessment Patient has had a recent decline in their functional status and demonstrates the ability to make significant improvements in function in a reasonable and predictable amount of time.     Precautions / Restrictions Precautions Precautions: Fall Required Braces or Orthoses: Other Brace Other Brace: L post op shoe Restrictions Weight Bearing Restrictions: Yes LLE Weight Bearing:  (heel weight bearing)      Mobility  Bed Mobility               General bed mobility comments: Sitting EOB  upon arrival    Transfers Overall transfer level: Independent                      Ambulation/Gait Ambulation/Gait assistance: Modified independent (Device/Increase time) Gait Distance (Feet): 60 Feet (60 ft with RW, additional 60 ft with knee scooter) Assistive device: Rolling walker (2 wheels) (knee scooter) Gait Pattern/deviations: Step-through pattern, Decreased stance time - left, Decreased weight shift to left Gait velocity: decreased     General Gait Details: Pt ambulated initial 60 ft with a RW. Cues for weightbearing precautions, sequencing, moderate use of BUE's on walker. Pt then propelled knee scooter x 60 ft; instruction for brakes and turns. Pt reports discomfort on shin with preference for RW  Stairs            Wheelchair Mobility    Modified Rankin (Stroke Patients Only)       Balance Overall balance assessment: Mild deficits observed, not formally tested                                           Pertinent Vitals/Pain Pain Assessment Pain Assessment: No/denies pain    Home Living Family/patient expects to be discharged to:: Private residence Living Arrangements: Spouse/significant other;Children Available Help at Discharge: Available 24 hours/day;Family Type of Home: Apartment Home Access: Level entry       Home Layout: One level Home Equipment: Cane - single point;Grab bars - tub/shower      Prior Function Prior Level of Function : Driving;Independent/Modified Independent  Mobility Comments: Uses cane sometimes for balance ADLs Comments: Pt does not go out in community much (limited mostly by shortness of breath due to cardiac PMH). Pt independent with basic ADL's. Pt's wife does cooking, laundry, and cleaning but pt states he can do these tasks if needed.     Hand Dominance   Dominant Hand: Right    Extremity/Trunk Assessment   Upper Extremity Assessment Upper Extremity Assessment: Overall  WFL for tasks assessed    Lower Extremity Assessment Lower Extremity Assessment: LLE deficits/detail;RLE deficits/detail RLE Sensation: history of peripheral neuropathy LLE Deficits / Details: s/p R 1st toe amputation. Hip/knee WFL LLE Sensation: history of peripheral neuropathy    Cervical / Trunk Assessment Cervical / Trunk Assessment: Normal  Communication   Communication: No difficulties  Cognition Arousal/Alertness: Awake/alert Behavior During Therapy: WFL for tasks assessed/performed Overall Cognitive Status: Within Functional Limits for tasks assessed                                          General Comments      Exercises     Assessment/Plan    PT Assessment Patient does not need any further PT services  PT Problem List Decreased activity tolerance;Decreased mobility;Pain       PT Treatment Interventions      PT Goals (Current goals can be found in the Care Plan section)  Acute Rehab PT Goals Patient Stated Goal: to heal PT Goal Formulation: All assessment and education complete, DC therapy    Frequency       Co-evaluation               AM-PAC PT "6 Clicks" Mobility  Outcome Measure Help needed turning from your back to your side while in a flat bed without using bedrails?: None Help needed moving from lying on your back to sitting on the side of a flat bed without using bedrails?: None Help needed moving to and from a bed to a chair (including a wheelchair)?: None Help needed standing up from a chair using your arms (e.g., wheelchair or bedside chair)?: None Help needed to walk in hospital room?: None Help needed climbing 3-5 steps with a railing? : A Little 6 Click Score: 23    End of Session Equipment Utilized During Treatment: Other (comment) (post op shoe) Activity Tolerance: Patient tolerated treatment well Patient left: in bed Nurse Communication: Mobility status;Weight bearing status PT Visit Diagnosis: Pain;Other  abnormalities of gait and mobility (R26.89) Pain - Right/Left: Left Pain - part of body: Ankle and joints of foot    Time: 6433-2951 PT Time Calculation (min) (ACUTE ONLY): 18 min   Charges:   PT Evaluation $PT Re-evaluation: 1 Re-eval          Lillia Pauls, PT, DPT Acute Rehabilitation Services Office [phone]   Norval Morton 03/12/2022, 9:06 AM

## 2022-03-12 NOTE — Progress Notes (Signed)
  Subjective:  Patient ID: Austin Vaughan, male    DOB: 1970/10/26,  MRN: [id number]   Doing OK, had some strikethrough last night, dressing was changed, some pain  Negative for chest pain and shortness of breath Review of all other systems is negative Objective:   Vitals:   03/12/22 0439 03/12/22 0814  BP: 123/75 140/77  Pulse: 73 75  Resp: 16 16  Temp: 98.1 F (36.7 C) (!) 97.5 F (36.4 C)  SpO2: 96% 97%   General AA&O x3. Normal mood and affect.  Vascular Dorsalis pedis and posterior tibial pulses 2/4 bilat. Brisk capillary refill to all digits. Pedal hair diminished.  Neurologic Epicritic sensation grossly reduced.  Dermatologic Dressing now is clean dry and intact.  Orthopedic: MMT 5/5 in dorsiflexion, plantarflexion, inversion, and eversion. Normal joint ROM without pain or crepitus.    Assessment & Plan:  Patient was evaluated and treated and all questions answered.  POD#1  s/p L great toe amputation -WBAT to heels in post op shoe -No dressing changes needed at home -10 days bactrim at discharge -Outpatient f/u will be arranged by my office  Edwin Cap, DPM  Accessible via secure chat for questions or concerns.

## 2022-03-12 NOTE — Discharge Instructions (Addendum)
Dear Austin Vaughan,  Thank you for letting us participate in your care. You were hospitalized for L great toe pain and diagnosed with Osteitis (HCC). You were treated with amputation of the toe and antibiotics.   POST-HOSPITAL & CARE INSTRUCTIONS Take the antibiotic, Bactrim, for 10 days We recommend taking gabapentin 600 mg twice in the day with 1200 mg at night. We also recommend lyrica 300 mg once at night. These dosages are safer for your kidneys and overall health Continue to bear weight on your left foot as tolerated with your surgical shoe Make an appointment with your PCP at [facility] The podiatrist's office will call you to arrange a follow up visit with them Go to your other follow up appointments (listed below)  DOCTOR'S APPOINTMENT   Future Appointments  Date Time Provider Department Center  03/23/2022  3:00 PM Javier Glazier [facility]  03/27/2022  1:30 PM Nita Sickle K, DO [facility] None  05/16/2022 11:10 AM Shamleffer, Konrad Dolores, MD [facility] None  06/01/2022  8:30 AM Storm Frisk, MD [facility] None  07/25/2022  2:30 PM Storm Frisk, MD [facility] None     Take care and be well!  Family Medicine Teaching Service Inpatient Team [place]  [facility]  [address] [place], [place] [address] [phone]

## 2022-03-13 ENCOUNTER — Encounter: Payer: Self-pay | Admitting: Podiatry

## 2022-03-13 ENCOUNTER — Other Ambulatory Visit: Payer: Self-pay

## 2022-03-13 ENCOUNTER — Encounter: Payer: Self-pay | Admitting: *Deleted

## 2022-03-13 ENCOUNTER — Telehealth: Payer: Self-pay | Admitting: *Deleted

## 2022-03-13 MED ORDER — RAMIPRIL 10 MG PO CAPS: 10.0000 mg | ORAL_CAPSULE | Freq: Every day | ORAL | 3 refills | Status: AC

## 2022-03-13 MED ORDER — NITROGLYCERIN 0.4 MG/HR TD PT24: 0.4000 mg | MEDICATED_PATCH | Freq: Every day | TRANSDERMAL | 12 refills | Status: AC

## 2022-03-13 MED ORDER — METFORMIN HCL 1000 MG PO TABS: 1000.0000 mg | ORAL_TABLET | Freq: Two times a day (BID) | ORAL | 3 refills | Status: DC

## 2022-03-13 MED ORDER — ROPINIROLE HCL 4 MG PO TABS
4.0000 mg | ORAL_TABLET | Freq: Every day | ORAL | 2 refills | Status: DC
Start: 2022-03-13 — End: 2022-09-27

## 2022-03-13 MED ORDER — DAPAGLIFLOZIN PROPANEDIOL 10 MG PO TABS: 10.0000 mg | ORAL_TABLET | Freq: Every day | ORAL | 4 refills | Status: DC

## 2022-03-13 MED ORDER — SULFAMETHOXAZOLE-TRIMETHOPRIM 800-160 MG PO TABS: 1.0000 | ORAL_TABLET | Freq: Two times a day (BID) | ORAL | 0 refills | Status: DC

## 2022-03-13 MED ORDER — PANTOPRAZOLE SODIUM 40 MG PO TBEC: 40.0000 mg | DELAYED_RELEASE_TABLET | Freq: Every day | ORAL | 3 refills | Status: AC

## 2022-03-13 MED ORDER — INSULIN ASPART 100 UNIT/ML FLEXPEN
PEN_INJECTOR | SUBCUTANEOUS | 11 refills | Status: DC
Start: 2022-03-13 — End: 2022-07-18

## 2022-03-13 MED ORDER — GABAPENTIN 600 MG PO TABS: ORAL_TABLET | ORAL | 0 refills | Status: AC

## 2022-03-13 MED ORDER — OXYCODONE HCL 5 MG PO TABS: 5.0000 mg | ORAL_TABLET | ORAL | 0 refills | Status: DC | PRN

## 2022-03-13 MED ORDER — ATORVASTATIN CALCIUM 80 MG PO TABS: 80.0000 mg | ORAL_TABLET | Freq: Every day | ORAL | 3 refills | Status: AC

## 2022-03-13 MED ORDER — METOPROLOL SUCCINATE ER 100 MG PO TB24: 100.0000 mg | ORAL_TABLET | Freq: Every day | ORAL | 3 refills | Status: AC

## 2022-03-13 NOTE — Patient Outreach (Signed)
  Care Coordination TOC Note Transition Care Management Follow-up Telephone Call Date of discharge and from where: 03/12/22- [facility] How have you been since you were released from the hospital? "I am doing fine, but the hospital called in my pain medicine and my antibiotic to the wrong pharmacy- I have called my doctors to have the prescriptions sent to [facility] on [address] Any questions or concerns? Yes- as above, patient reports discharge prescriptions called in to wrong outpatient pharmacy- said they were sent [facility] of [facility]; upon review of medications with patient, noted that both pain medication and antibiotics were called in to [facility] as of earlier today- verified with patient that these medications were sent to outpatient pharmacy that match patient's correct pharmacy on 03/13/22  Items Reviewed: Did the pt receive and understand the discharge instructions provided? Yes  Medications obtained and verified? Yes - verified; has not yet obtained pain medication or antibiotic: upon verification that these medications were sent to correct outpatient pharmacy- patient states "will call and pick up right away" Other? No  Any new allergies since your discharge? No  Dietary orders reviewed? Yes Do you have support at home? Yes wife and daughter assisting as indicated  Home Care and Equipment/Supplies: Were home health services ordered? no If so, what is the name of the agency? N/A  Has the agency set up a time to come to the patient's home? not applicable Were any new equipment or medical supplies ordered?  Yes: rolling walker - reports was delivered to hospital room prior to discharge What is the name of the medical supply agency? [employer] Were you able to get the supplies/equipment? yes Do you have any questions related to the use of the equipment or supplies? No  Functional Questionnaire: (I = Independent and D = Dependent) ADLs: I wife assists as indicated/  needed  Bathing/Dressing- I wife assists as indicated/ needed  Meal Prep- I wife assists as indicated/ needed  Eating- I  Maintaining continence- I  Transferring/Ambulation- I wife assists as indicated/ needed  Managing Meds- I wife assists as indicated/ needed  Follow up appointments reviewed:  PCP Hospital f/u appt confirmed? Yes  Scheduled to see Dr. Delford Field on "August 14" @ "sometime that day;" verified with patient that I do not see scheduled PCP appointment- encouraged to schedule appointment but will ask scheduler to reach out to ensure same within 2 weeks of discharge as instructed Specialist Hospital f/u appt confirmed? Yes  Scheduled to see podiatry, Dr. Lilian Kapur on 03/16/22 @ 10:15 Are transportation arrangements needed? No  If their condition worsens, is the pt aware to call PCP or go to the Emergency Dept.? Yes Was the patient provided with contact information for the PCP's office or ED? Yes Was to pt encouraged to call back with questions or concerns? Yes  SDOH assessments and interventions completed:   Yes  Care Coordination Interventions Activated:  Yes Care Coordination Interventions:  PCP follow up appointment requested; clarified medications ordered post-hospital discharge and updated patient that medications have now been sent to correct outpatient pharmacy as of today, 03/13/22   Encounter Outcome:  Pt. Visit Completed  Caryl Pina, RN, BSN, CCRN Alumnus RN [facility] Care Coordination- [facility] Care Management [phone]: direct office

## 2022-03-13 NOTE — Addendum Note (Signed)
Addended by: Shan Levans E on: 03/13/2022 03:16 PM   Modules accepted: Orders

## 2022-03-14 ENCOUNTER — Telehealth: Payer: Self-pay | Admitting: *Deleted

## 2022-03-14 ENCOUNTER — Telehealth: Payer: Self-pay

## 2022-03-14 LAB — CULTURE, BLOOD (ROUTINE X 2)
Culture: NO GROWTH
Culture: NO GROWTH

## 2022-03-14 NOTE — Telephone Encounter (Signed)
Patient verbalized understanding of information given. 

## 2022-03-14 NOTE — Telephone Encounter (Signed)
Left message on voicemail to return call.

## 2022-03-15 ENCOUNTER — Other Ambulatory Visit: Payer: Self-pay | Admitting: Critical Care Medicine

## 2022-03-15 ENCOUNTER — Ambulatory Visit: Payer: Medicare Other | Admitting: Podiatry

## 2022-03-15 DIAGNOSIS — E1140 Type 2 diabetes mellitus with diabetic neuropathy, unspecified: Secondary | ICD-10-CM

## 2022-03-15 LAB — SURGICAL PATHOLOGY

## 2022-03-16 ENCOUNTER — Telehealth: Payer: Self-pay

## 2022-03-16 ENCOUNTER — Ambulatory Visit (INDEPENDENT_AMBULATORY_CARE_PROVIDER_SITE_OTHER): Payer: Medicare Other | Admitting: Podiatry

## 2022-03-16 DIAGNOSIS — Z89412 Acquired absence of left great toe: Secondary | ICD-10-CM | POA: Diagnosis not present

## 2022-03-16 DIAGNOSIS — I96 Gangrene, not elsewhere classified: Secondary | ICD-10-CM

## 2022-03-16 MED ORDER — OXYCODONE HCL 5 MG PO TABS: 5.0000 mg | ORAL_TABLET | ORAL | 0 refills | Status: AC | PRN

## 2022-03-16 NOTE — Telephone Encounter (Signed)
Called patient to make him aware of endocrinology appointment , he stated that he does not want to go there and will keep the one he has in [place].

## 2022-03-19 ENCOUNTER — Encounter: Payer: Self-pay | Admitting: Neurology

## 2022-03-19 NOTE — Progress Notes (Signed)
  Subjective:  Patient ID: Austin Vaughan, male    DOB: [birth year],  MRN: [id number]  Chief Complaint  Patient presents with   Routine Post Op     POV #1 DOS 03/11/22 Left great toe amputation      52 y.o. male returns for post-op check.  Overall feels that he is doing well  Review of Systems: Negative except as noted in the HPI. Denies N/V/F/Ch.   Objective:  There were no vitals filed for this visit. There is no height or weight on file to calculate BMI. Constitutional Well developed. Well nourished.  Vascular Foot warm and well perfused. Capillary refill normal to all digits.  Calf is soft and supple, no posterior calf or knee pain, negative Homans' sign  Neurologic Normal speech. Oriented to person, place, and time. Epicritic sensation to light touch grossly present bilaterally.  Dermatologic Skin healing well without signs of infection.  There is some maceration  Orthopedic: Tenderness to palpation noted about the surgical site.    Assessment:   1. Status post amputation of great toe, left (HCC)   2. Gangrene of toe of left foot (HCC)    Plan:  Patient was evaluated and treated and all questions answered.  S/p foot surgery left -Progressing as expected post-operatively.  Had some maceration.  Povidone ointment applied and they will change the dressing every other day -WB Status: WBAT in cam walker boot.  This was dispensed today.  This was easier for him than the surgical shoe for his ambulation and balance -Sutures: Removed in 2 weeks. -Medications: Final refill of pain medication sent to pharmacy expect this will resolve uneventfully now -Foot redressed.  Return in about 2 weeks (around 03/30/2022) for post op (no x-rays), suture removal.

## 2022-03-22 ENCOUNTER — Telehealth: Payer: Self-pay | Admitting: Emergency Medicine

## 2022-03-22 DIAGNOSIS — E1142 Type 2 diabetes mellitus with diabetic polyneuropathy: Secondary | ICD-10-CM

## 2022-03-22 NOTE — Telephone Encounter (Signed)
Copied from CRM [id number]. Topic: General - Inquiry >> Mar 22, 2022  4:39 PM Marlow Baars wrote: Reason for CRM: Francisco Capuchin with [facility] called in regarding the patients prescription for his insulin pen. The prescription says to inject under the skin but doesn't say how much. Please assist further.

## 2022-03-23 ENCOUNTER — Ambulatory Visit: Payer: Medicare Other | Admitting: Physician Assistant

## 2022-03-23 MED ORDER — LANTUS SOLOSTAR 100 UNIT/ML ~~LOC~~ SOPN: 25.0000 [IU] | PEN_INJECTOR | Freq: Every day | SUBCUTANEOUS | 3 refills | Status: DC

## 2022-03-23 NOTE — Addendum Note (Signed)
Addended by: Lois Huxley, Jeannett Senior L on: 03/23/2022 05:24 PM   Modules accepted: Orders

## 2022-03-23 NOTE — Telephone Encounter (Signed)
Rx sent with updated instructions to include amount of insulin to inject.

## 2022-03-27 ENCOUNTER — Ambulatory Visit: Payer: Medicare Other | Admitting: Neurology

## 2022-03-28 ENCOUNTER — Encounter: Payer: Self-pay | Admitting: Critical Care Medicine

## 2022-03-28 ENCOUNTER — Ambulatory Visit (INDEPENDENT_AMBULATORY_CARE_PROVIDER_SITE_OTHER): Payer: Medicare Other | Admitting: Podiatry

## 2022-03-28 DIAGNOSIS — Z89412 Acquired absence of left great toe: Secondary | ICD-10-CM | POA: Diagnosis not present

## 2022-03-28 DIAGNOSIS — L97521 Non-pressure chronic ulcer of other part of left foot limited to breakdown of skin: Secondary | ICD-10-CM

## 2022-03-28 NOTE — Progress Notes (Signed)
  Subjective:  Patient ID: Austin Vaughan, male    DOB: 1971/06/02,  MRN: [id number]  Chief Complaint  Patient presents with   Routine Post Op    POV #2 DOS 03/11/22 Left great toe amputation- pain comes and goes. Sutures are intact. Using cam boot.       51 y.o. male returns for post-op check.  They have been applying Betadine daily  Review of Systems: Negative except as noted in the HPI. Denies N/V/F/Ch.   Objective:  There were no vitals filed for this visit. There is no height or weight on file to calculate BMI. Constitutional Well developed. Well nourished.  Vascular Foot warm and well perfused. Capillary refill normal to all digits.  Calf is soft and supple, no posterior calf or knee pain, negative Homans' sign  Neurologic Normal speech. Oriented to person, place, and time. Epicritic sensation to light touch grossly present bilaterally.  Dermatologic Lateral portion of the incision shows delayed healing with exposed subcutaneous tissue, no signs infection.  Mixed fibrogranular wound bed  Orthopedic: Tenderness to palpation noted about the surgical site.    Assessment:   1. Status post amputation of great toe, left (HCC)   2. Ulcer of great toe, left, limited to breakdown of skin [facility])    Plan:  Patient was evaluated and treated and all questions answered.  S/p foot surgery left -Sutures and staples removed today.  He may continue to [facility] in the cam boot.  Continue dressings daily with Betadine wet-to-dry dressings which they will do at home.  Iodosorb applied today.  I will see him back in 2 weeks and plan to debride the area at this point if it has not healed.  Return in about 2 weeks (around 04/11/2022) for wound care.

## 2022-03-30 ENCOUNTER — Encounter: Payer: Medicare Other | Admitting: Podiatry

## 2022-04-03 ENCOUNTER — Encounter (HOSPITAL_BASED_OUTPATIENT_CLINIC_OR_DEPARTMENT_OTHER): Payer: Self-pay | Admitting: Cardiovascular Disease

## 2022-04-03 ENCOUNTER — Encounter: Payer: Self-pay | Admitting: Critical Care Medicine

## 2022-04-03 DIAGNOSIS — K2950 Unspecified chronic gastritis without bleeding: Secondary | ICD-10-CM

## 2022-04-04 MED ORDER — GENTAMICIN SULFATE 0.1 % EX OINT: 1.0000 | TOPICAL_OINTMENT | Freq: Every day | CUTANEOUS | 0 refills | Status: DC

## 2022-04-06 ENCOUNTER — Ambulatory Visit (INDEPENDENT_AMBULATORY_CARE_PROVIDER_SITE_OTHER): Payer: Medicare Other | Admitting: Podiatry

## 2022-04-06 DIAGNOSIS — Z89412 Acquired absence of left great toe: Secondary | ICD-10-CM

## 2022-04-06 NOTE — Progress Notes (Signed)
DOS 03/11/22 Left great toe amputation.   Patient seen at the office for dressing change. Removed dressing. Some sutures are intact. Patient stated he believes he was allergic to betadine and started using the gentamicin ointment daily that was prescribed by provider.  Patient stated he still notices a blood and pus like drainage with no smell. Denies any fever, nausea, vomiting, chills or chest pain.   Provider notified of the drainage and sutures. Provider ordered for patient to continue using gentamicin ointment as ordered and patient will follow up with provider on 04/11/2022.  Triple antibiotic applied to area, covered with non-adhesive dressing, followed by gauze roll, secured with coban and stockinet.   Advise patient to call the office with any questions or concerns. Any signs or symptoms of infection to go to the ED.

## 2022-04-10 ENCOUNTER — Ambulatory Visit: Payer: Medicare Other | Admitting: Neurology

## 2022-04-10 ENCOUNTER — Encounter: Payer: Self-pay | Admitting: Neurology

## 2022-04-10 DIAGNOSIS — Z029 Encounter for administrative examinations, unspecified: Secondary | ICD-10-CM

## 2022-04-11 ENCOUNTER — Ambulatory Visit (INDEPENDENT_AMBULATORY_CARE_PROVIDER_SITE_OTHER): Payer: Medicare Other | Admitting: Podiatry

## 2022-04-11 DIAGNOSIS — L97521 Non-pressure chronic ulcer of other part of left foot limited to breakdown of skin: Secondary | ICD-10-CM

## 2022-04-11 DIAGNOSIS — Z89412 Acquired absence of left great toe: Secondary | ICD-10-CM

## 2022-04-12 ENCOUNTER — Telehealth: Payer: Self-pay | Admitting: Neurology

## 2022-04-12 NOTE — Telephone Encounter (Signed)
Patient dismissed from [facility] by Nita Sickle, DO, effective 04/10/22. Dismissal Letter sent out by 1st class mail. [employer]

## 2022-04-12 NOTE — Progress Notes (Signed)
  Subjective:  Patient ID: Austin Vaughan, male    DOB: October 04, 1970,  MRN: [id number]  Chief Complaint  Patient presents with   Routine Post Op    DOS 03/11/22 amputation left great toe, wound care      51 y.o. male returns for post-op check.  Doing much better after ceasing the iodine ointment and going to the gentamicin  Review of Systems: Negative except as noted in the HPI. Denies N/V/F/Ch.   Objective:  There were no vitals filed for this visit. There is no height or weight on file to calculate BMI. Constitutional Well developed. Well nourished.  Vascular Foot warm and well perfused. Capillary refill normal to all digits.  Calf is soft and supple, no posterior calf or knee pain, negative Homans' sign  Neurologic Normal speech. Oriented to person, place, and time. Epicritic sensation to light touch grossly present bilaterally.  Dermatologic Delayed healing with fibrogranular wound base, appears much improved in size and dimensions.  No signs of infection  Orthopedic: Tenderness to palpation noted about the surgical site.    Assessment:   1. Status post amputation of great toe, left (HCC)   2. Ulcer of great toe, left, limited to breakdown of skin [facility])    Plan:  Patient was evaluated and treated and all questions answered.  S/p foot surgery left -Overall doing well.  Excisional debridement of the nonviable fibrous tissue was carried out today with a sharp scalpel he tolerated this well.  Dressings were applied and he will continue gentamicin ointment at home.  Expect this to heal at this point  Return in about 4 weeks (around 05/09/2022) for wound care.

## 2022-04-18 ENCOUNTER — Encounter: Payer: Self-pay | Admitting: Critical Care Medicine

## 2022-04-18 DIAGNOSIS — Z8673 Personal history of transient ischemic attack (TIA), and cerebral infarction without residual deficits: Secondary | ICD-10-CM

## 2022-04-24 ENCOUNTER — Encounter: Payer: Self-pay | Admitting: Critical Care Medicine

## 2022-04-24 DIAGNOSIS — K2950 Unspecified chronic gastritis without bleeding: Secondary | ICD-10-CM

## 2022-05-09 ENCOUNTER — Ambulatory Visit (INDEPENDENT_AMBULATORY_CARE_PROVIDER_SITE_OTHER): Payer: Medicare Other | Admitting: Podiatry

## 2022-05-09 DIAGNOSIS — L97521 Non-pressure chronic ulcer of other part of left foot limited to breakdown of skin: Secondary | ICD-10-CM

## 2022-05-11 NOTE — Progress Notes (Signed)
  Subjective:  Patient ID: Austin Vaughan, male    DOB: 1970-12-16,  MRN: [id number]  Chief Complaint  Patient presents with   Foot Ulcer    1 month follow up, S/P amputation left great toe      51 y.o. male returns for post-op check.  He is doing well still using the gentamicin  Review of Systems: Negative except as noted in the HPI. Denies N/V/F/Ch.   Objective:  There were no vitals filed for this visit. There is no height or weight on file to calculate BMI. Constitutional Well developed. Well nourished.  Vascular Foot warm and well perfused. Capillary refill normal to all digits.  Calf is soft and supple, no posterior calf or knee pain, negative Homans' sign  Neurologic Normal speech. Oriented to person, place, and time. Epicritic sensation to light touch grossly present bilaterally.  Dermatologic Delayed healing with fibrogranular wound base, measure 0.4 x 0.3 x 0.2 cm.  Orthopedic: Tenderness to palpation noted about the surgical site.     Assessment:   1. Ulcer of great toe, left, limited to breakdown of skin [facility])     Plan:  Patient was evaluated and treated and all questions answered.  S/p foot surgery left -Continues to improve.  Excisional debridement of the nonviable fibrous tissue was carried out today with a sharp scalpel he tolerated this well.  This was done to the subcutaneous layer.  Postdebridement measurements are noted above.  Dressings were applied and he will continue gentamicin ointment at home.  Expect this to heal at this point  Return in about 3 weeks (around 05/30/2022) for wound care.

## 2022-05-14 ENCOUNTER — Encounter: Payer: Self-pay | Admitting: Internal Medicine

## 2022-05-14 ENCOUNTER — Encounter: Payer: Self-pay | Admitting: Podiatry

## 2022-05-16 ENCOUNTER — Ambulatory Visit: Payer: Medicare Other | Admitting: Internal Medicine

## 2022-05-22 ENCOUNTER — Ambulatory Visit (INDEPENDENT_AMBULATORY_CARE_PROVIDER_SITE_OTHER): Payer: Medicare Other | Admitting: Internal Medicine

## 2022-05-22 ENCOUNTER — Encounter: Payer: Self-pay | Admitting: Internal Medicine

## 2022-05-22 VITALS — BP 132/84 | HR 95 | Ht 74.0 in | Wt 260.0 lb

## 2022-05-22 DIAGNOSIS — E1165 Type 2 diabetes mellitus with hyperglycemia: Secondary | ICD-10-CM | POA: Diagnosis not present

## 2022-05-22 DIAGNOSIS — Z794 Long term (current) use of insulin: Secondary | ICD-10-CM

## 2022-05-22 DIAGNOSIS — E1142 Type 2 diabetes mellitus with diabetic polyneuropathy: Secondary | ICD-10-CM | POA: Diagnosis not present

## 2022-05-22 DIAGNOSIS — E1159 Type 2 diabetes mellitus with other circulatory complications: Secondary | ICD-10-CM

## 2022-05-22 LAB — POCT GLYCOSYLATED HEMOGLOBIN (HGB A1C): Hemoglobin A1C: 7.7 % — AB (ref 4.0–5.6)

## 2022-05-22 MED ORDER — DEXCOM G6 TRANSMITTER MISC: 1.0000 | 3 refills | Status: AC

## 2022-05-22 MED ORDER — LANTUS SOLOSTAR 100 UNIT/ML ~~LOC~~ SOPN: 48.0000 [IU] | PEN_INJECTOR | Freq: Every day | SUBCUTANEOUS | 3 refills | Status: DC

## 2022-05-22 MED ORDER — DEXCOM G6 SENSOR MISC: 1.0000 | 11 refills | Status: AC

## 2022-05-22 MED ORDER — DAPAGLIFLOZIN PROPANEDIOL 10 MG PO TABS: 10.0000 mg | ORAL_TABLET | Freq: Every day | ORAL | 2 refills | Status: AC

## 2022-05-22 MED ORDER — OMNIPOD 5 DEXG7G6 INTRO GEN 5 KIT: 1.0000 | PACK | 0 refills | Status: DC

## 2022-05-22 MED ORDER — OMNIPOD 5 DEXG7G6 PODS GEN 5 MISC: 1.0000 | 2 refills | Status: DC

## 2022-05-22 MED ORDER — METFORMIN HCL 1000 MG PO TABS: 1000.0000 mg | ORAL_TABLET | Freq: Every day | ORAL | 2 refills | Status: AC

## 2022-05-22 NOTE — Patient Instructions (Addendum)
-   Increase Lantus to 48 units daily  - Continue  Novolog 30  units with each meal  - Decrease  Metformin 1000 mg, one tablet daily  - Continue Farxiga 10, 1 tablet daily with Breakfast  -Novolog correctional insulin: ADD extra units on insulin to your meal-time Novolog dose if your blood sugars are higher than 150. Use the scale below to help guide you:   Blood sugar before meal Number of units to inject  Less than 150 0 unit  151 -  170 1 units  171 -  190 2 units  191 -  210 3 units  211 -  230 4 units  231 -  250 5 units  251 -  270 6 units  271 -  290 7 units  291 -  310 8 units  311- 330 9 units      HOW TO TREAT LOW BLOOD SUGARS (Blood sugar LESS THAN 70 MG/DL) Please follow the RULE OF 15 for the treatment of hypoglycemia treatment (when your (blood sugars are less than 70 mg/dL)   STEP 1: Take 15 grams of carbohydrates when your blood sugar is low, which includes:  3-4 GLUCOSE TABS  OR 3-4 OZ OF JUICE OR REGULAR SODA OR ONE TUBE OF GLUCOSE GEL    STEP 2: RECHECK blood sugar in 15 MINUTES STEP 3: If your blood sugar is still low at the 15 minute recheck --> then, go back to STEP 1 and treat AGAIN with another 15 grams of carbohydrates.

## 2022-05-22 NOTE — Progress Notes (Signed)
PATIENT IDENTIFIER: Austin Vaughan is a 51 y.o. male with a past medical history of T2DM, HTN, CAD. The patient has followed with Endocrinology clinic since 10/17/2018 for consultative assistance with management of his diabetes.  DIABETIC HISTORY:  Mr. Blick was diagnosed with T2DM since 2013. He has tried Januvia in the past but was ineffective in controlling his hyperglycemia. He used to be on Metformin but unclear the reason for discontinuation.He has been on insulin ~ 2018. His hemoglobin A1c has ranged from  9.5% in 2020, peaking at 10.0% in 2019.   On his initial visit to our clinic his A1c was 9.5% . He was on an MDi regimen and Glipizide. We stopped Glipizide and started Metformin. By 01/2019 [name] was started.   SUBJECTIVE:   During the last visit (02/03/2022): A1c 8.8 %. We continued metformin , Farxiga,  lantus and novolog    Today (05/22/2022): Austin Vaughan is here for a follow up on his diabetes management. He has not been to our clinic in 16 months.  He checks his blood sugars 4 times daily through freestyle libre . The patient has not had hypoglycemic episodes since the last clinic .   He is S/P lef great toe amputation 03/2022, was last seen by podiatry 05/09/2022  He has abdominal pain for 3 month , pending GI evaluation  Denies nausea but has diarrhea for 3 months     HOME DIABETES REGIMEN:  Lantus 40 units daily HumaLog 24  units 3 times daily q. before meals- takes 30 units  Metformin 1000 mg BID Farxiga 10 mg daily       CONTINUOUS GLUCOSE MONITORING RECORD INTERPRETATION    Dates of Recording: 9/26-10/9/203  Sensor description:freestyle libre   Results statistics:   CGM use % of time 35  Average and SD 242/25.6  Time in range     14 %  % Time Above 180 49  % Time above 250 37  % Time Below target 0    Glycemic patterns summary: Hyperglycemia all day and night   Hyperglycemic episodes  all day and night   Hypoglycemic episodes occurred  N/A  Overnight periods: high    DIABETIC COMPLICATIONS: Microvascular complications:  Neuropathy, 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) 08/23/2019   GERD (gastroesophageal reflux disease) 06/21/2021   Hypertension    Sleep apnea    Past Surgical History:  Past Surgical History:  Procedure Laterality Date   CORONARY ANGIOPLASTY WITH STENT PLACEMENT  07/2002   TRANSMETATARSAL AMPUTATION Left 03/11/2022   Procedure: GREAT TOE AMPUTATION;  Surgeon: Criselda Peaches, DPM;  Location: [facility];  Service: Podiatry;  Laterality: Left;   Social History:  reports that he has never smoked. He has never used smokeless tobacco. He reports that he does not currently use alcohol. He reports that he does not use drugs. Family History:  Family History  Problem Relation Age of Onset   ALS Mother    Lung cancer Father    ALS Maternal Aunt    Heart disease Paternal Grandmother    Heart disease Paternal Grandfather      HOME MEDICATIONS: Allergies as of 05/22/2022  Reactions   Ambien [zolpidem Tartrate] Other (See Comments)   HALLUCINATIONS   Shellfish Allergy Anaphylaxis   Seafood        Medication List        Accurate as of May 22, 2022  3:46 PM. If you have any questions, ask your nurse or doctor.          STOP taking these medications    clindamycin 150 MG capsule Commonly known as: CLEOCIN Stopped by: Dorita Sciara, MD   sulfamethoxazole-trimethoprim 800-160 MG tablet Commonly known as: BACTRIM DS Stopped by: Dorita Sciara, MD       TAKE these medications    albuterol 108 (90 Base) MCG/ACT inhaler Commonly known as: VENTOLIN HFA TAKE 2 PUFFS BY MOUTH EVERY 6  HOURS AS NEEDED FOR WHEEZE OR SHORTNESS OF BREATH What changed: See the new instructions.   aspirin EC 81 MG tablet Take 81 mg by mouth daily.   atorvastatin 80 MG tablet Commonly known as: LIPITOR Take 1 tablet (80 mg total) by mouth daily.   cetirizine 10 MG tablet Commonly known as: ZYRTEC Take 1 tablet (10 mg total) by mouth daily.   dapagliflozin propanediol 10 MG Tabs tablet Commonly known as: Farxiga Take 1 tablet (10 mg total) by mouth daily before breakfast.   Dexcom G6 Sensor Misc 1 Device by Does not apply route as directed. Started by: Dorita Sciara, MD   Dexcom G6 Transmitter Misc 1 Device by Does not apply route as directed. Started by: Dorita Sciara, MD   dicyclomine 20 MG tablet Commonly known as: BENTYL TAKE 1 TABLET (20 MG TOTAL) BY MOUTH 4 (FOUR) TIMES DAILY AS NEEDED (ABDOMINAL CRAMPING).   EPINEPHrine 0.15 MG/0.3ML injection Commonly known as: EPIPEN JR Inject 0.15 mg into the muscle as needed for anaphylaxis.   gabapentin 600 MG tablet Commonly known as: NEURONTIN Take 1 tablet (600 mg total) by mouth 2 (two) times daily AND 2 tablets (1,200 mg total) at bedtime.   gentamicin ointment 0.1 % Commonly known as: GARAMYCIN Apply 1 Application topically daily.   hydrOXYzine 25 MG capsule Commonly known as: VISTARIL Take 1 capsule (25 mg total) by mouth every 8 (eight) hours as needed for itching.   insulin aspart 100 UNIT/ML FlexPen Commonly known as: NOVOLOG Max dose 102 units   Lantus SoloStar 100 UNIT/ML Solostar Pen Generic drug: insulin glargine Inject 48 Units into the skin daily. What changed: how much to take Changed by: Dorita Sciara, MD   metFORMIN 1000 MG tablet Commonly known as: GLUCOPHAGE Take 1 tablet (1,000 mg total) by mouth daily with breakfast. What changed: when to take this Changed by: Dorita Sciara, MD   metoprolol succinate 100 MG 24 hr tablet Commonly known as: TOPROL-XL Take 1 tablet  (100 mg total) by mouth daily. Take with or immediately following a meal.   montelukast 10 MG tablet Commonly known as: SINGULAIR Take 1 tablet (10 mg total) by mouth daily.   nitroGLYCERIN 0.4 MG SL tablet Commonly known as: NITROSTAT PLACE 1 TABLET UNDER THE TONGUE EVERY 5 MINUTES AS NEEDED FOR CHEST PAIN. What changed:  how much to take how to take this when to take this reasons to take this additional instructions   nitroGLYCERIN 0.4 mg/hr patch Commonly known as: NITRODUR - Dosed in mg/24 hr Place 1 patch (0.4 mg total) onto the skin daily. What changed: Another medication with the same name was changed. Make sure you understand how and when to take  each.   nortriptyline 50 MG capsule Commonly known as: PAMELOR TAKE 1 CAPSULE BY MOUTH EVERYDAY AT BEDTIME   Omnipod 5 G6 Pod (Gen 5) Misc 1 Device by Does not apply route every other day. Started by: Dorita Sciara, MD   Omnipod 5 G6 Intro (Gen 5) Kit 1 Device by Does not apply route every other day. Started by: Dorita Sciara, MD   pantoprazole 40 MG tablet Commonly known as: PROTONIX Take 1 tablet (40 mg total) by mouth daily.   pregabalin 300 MG capsule Commonly known as: LYRICA Take 1 capsule (300 mg total) by mouth daily.   ramipril 10 MG capsule Commonly known as: ALTACE Take 1 capsule (10 mg total) by mouth daily.   rOPINIRole 4 MG tablet Commonly known as: REQUIP Take 1 tablet (4 mg total) by mouth at bedtime.          DATA REVIEWED:  Lab Results  Component Value Date   HGBA1C 7.7 (A) 05/22/2022   HGBA1C 7.9 (A) 02/22/2022   HGBA1C 9.3 (A) 08/23/2021    Latest Reference Range & Units 03/12/22 01:38  Sodium 135 - 145 mmol/L 135  Potassium 3.5 - 5.1 mmol/L 4.2  Chloride 98 - 111 mmol/L 105  CO2 22 - 32 mmol/L 21 (L)  Glucose 70 - 99 mg/dL 240 (H)  BUN 6 - 20 mg/dL 16  Creatinine 0.61 - 1.24 mg/dL 0.69  Calcium 8.9 - 10.3 mg/dL 8.5 (L)  Anion gap 5 - 15  9  Magnesium 1.7 - 2.4  mg/dL 2.3  GFR, Estimated >60 mL/min >60       ASSESSMENT / PLAN / RECOMMENDATIONS:   1) Type 2 Diabetes Mellitus, Sub-Optimally controlled, With neuropathic and macrovascular complications and s/p left great toe amputation- Most recent A1c of 7.7 %. Goal A1c < 7.0 %.   -Patient has been noted with hypoglycemia on freestyle [employer] -He apparently was taking Lantus 25 units post discharge from the hospital (normally he is on 40 units) but due to hyperglycemia he had increased it to 40 units -He had showed me compliance -I will increase his insulin as below -Interested in [place] 5, a prescription of OmniPod 5 pods/kit, Dexcom G6 has been faxed to [place] [facility], was also given pamphlet to register his OmniPod machine -A referral has been placed to our CDE for training -Given that he is having GI issues, I have advised him to reduce metformin to once daily to see if that would improve his symptoms pending further evaluation  MEDICATIONS: Decrease metformin 1000 mg BID Continue Farxiga 10 mg daily  Increase Lantus 48 units daily  Continue Novolog 30 units TID QAC Restart CF : Novolog (BG-130/20)   EDUCATION / INSTRUCTIONS: BG monitoring instructions: Patient is instructed to check his blood sugars 4 times a day, before meals and bedtime. Call [facility] if: BG persistently < 70 I reviewed the Rule of 15 for the treatment of hypoglycemia in detail with the patient. Literature supplied.     2) Diabetic complications:  Eye: Does not have known diabetic retinopathy.  Neuro/ Feet: Does have known diabetic peripheral neuropathy. Renal: Patient does not have known baseline CKD. He is on an ACEI/ARB at present.   F/U in 6 months   Signed electronically by: Mack Guise, MD  [facility]  [address] [place], [place] [address] Phone: [phone] FAX: [phone]   CC: Elsie Stain, MD [address]  [place] [address] Phone: [phone]  Fax: [phone]  Return to Endocrinology clinic as below: Future Appointments  Date Time Provider [place]  06/01/2022  2:15 PM Criselda Peaches, DPM [facility]  07/25/2022  2:30 PM Elsie Stain, MD [facility] None  11/27/2022  1:00 PM Riki Berninger, Melanie Crazier, MD [facility] None

## 2022-05-23 ENCOUNTER — Encounter: Payer: Self-pay | Admitting: Internal Medicine

## 2022-05-24 ENCOUNTER — Other Ambulatory Visit: Payer: Self-pay | Admitting: Internal Medicine

## 2022-05-24 MED ORDER — OMNIPOD 5 DEXG7G6 INTRO GEN 5 KIT: 1.0000 | PACK | 0 refills | Status: AC

## 2022-05-24 MED ORDER — OMNIPOD 5 DEXG7G6 PODS GEN 5 MISC: 1.0000 | 2 refills | Status: DC

## 2022-05-24 MED ORDER — OMNIPOD 5 DEXG7G6 PODS GEN 5 MISC: 1.0000 | 2 refills | Status: AC

## 2022-05-24 NOTE — Addendum Note (Signed)
Addended by: Dorita Sciara on: 05/24/2022 01:53 PM   Modules accepted: Orders

## 2022-05-25 ENCOUNTER — Encounter: Payer: Self-pay | Admitting: Internal Medicine

## 2022-06-01 ENCOUNTER — Telehealth: Payer: Medicare Other | Admitting: Critical Care Medicine

## 2022-06-01 ENCOUNTER — Ambulatory Visit (INDEPENDENT_AMBULATORY_CARE_PROVIDER_SITE_OTHER): Payer: Medicare Other | Admitting: Podiatry

## 2022-06-01 DIAGNOSIS — S90111A Contusion of right great toe without damage to nail, initial encounter: Secondary | ICD-10-CM

## 2022-06-01 DIAGNOSIS — L97521 Non-pressure chronic ulcer of other part of left foot limited to breakdown of skin: Secondary | ICD-10-CM

## 2022-06-01 DIAGNOSIS — S91209A Unspecified open wound of unspecified toe(s) with damage to nail, initial encounter: Secondary | ICD-10-CM

## 2022-06-01 MED ORDER — GENTAMICIN SULFATE 0.1 % EX OINT: 1.0000 | TOPICAL_OINTMENT | Freq: Every day | CUTANEOUS | 0 refills | Status: AC

## 2022-06-01 NOTE — Patient Instructions (Signed)

## 2022-06-02 NOTE — Progress Notes (Signed)
  Subjective:  Patient ID: Austin Vaughan, male    DOB: Sep 01, 1970,  MRN: [id number]  Chief Complaint  Patient presents with   Wound Check    3 week left wound follow up- left great toe. Patient stated he has noticed some drainage from area.    Nail Problem    Right hallux nail lifted from nail bed yesterday. Some bleeding noted but no pus.       51 y.o. male returns for post-op check.  He is doing well still using the gentamicin  Review of Systems: Negative except as noted in the HPI. Denies N/V/F/Ch.   Objective:  There were no vitals filed for this visit. There is no height or weight on file to calculate BMI. Constitutional Well developed. Well nourished.  Vascular Foot warm and well perfused. Capillary refill normal to all digits.  Calf is soft and supple, no posterior calf or knee pain, negative Homans' sign  Neurologic Normal speech. Oriented to person, place, and time. Epicritic sensation to light touch grossly present bilaterally.  Dermatologic Area of delayed healing has completely healed and epithelialized.  He has partial traumatic avulsion of the right hallux nail plate with bleeding no signs of infection  Orthopedic: Tenderness to palpation noted about the surgical site.     Assessment:   1. Ulcer of great toe, left, limited to breakdown of skin ([place])   2. Traumatic avulsion of nail plate of toe, initial encounter     Plan:  Patient was evaluated and treated and all questions answered.  S/p foot surgery left -Site is nearly fully healed.  He will leave OTA and apply gentamicin which I refilled.  Regarding his right hallux he had a near complete partial avulsion of the hallux there is bleeding no signs of infection.  I recommended avulsion of the loose portions of the nail plate today.  This was completed following prep with Betadine.  He tolerated this well.  Post care instructions were given he will let me know if he has any further issues otherwise I will see  him for follow-up as scheduled  Return in about 6 weeks (around 07/13/2022) for nail and wound re-check.

## 2022-06-05 ENCOUNTER — Other Ambulatory Visit: Payer: Self-pay

## 2022-06-05 DIAGNOSIS — I2510 Atherosclerotic heart disease of native coronary artery without angina pectoris: Secondary | ICD-10-CM | POA: Insufficient documentation

## 2022-06-06 ENCOUNTER — Telehealth: Payer: Self-pay | Admitting: Pharmacy Technician

## 2022-06-06 ENCOUNTER — Telehealth: Payer: Self-pay

## 2022-06-06 ENCOUNTER — Other Ambulatory Visit (HOSPITAL_COMMUNITY): Payer: Self-pay

## 2022-06-06 NOTE — Telephone Encounter (Signed)
Can you go ahead and run it ? [facility] is waiting for PA. I though they did the PA,

## 2022-06-06 NOTE — Telephone Encounter (Signed)
Can we see if a pa is needed for Omnipod 5.

## 2022-06-06 NOTE — Telephone Encounter (Signed)
Pharmacy Patient Advocate Encounter   Received notification from CMA/office that prior authorization for Omnipod 5 kit & pods is required/requested.   PA submitted on 06/06/22 to [facility] via [place] has been approved.    PA#  PA Case ID: [id number] Effective dates: 08/14/21 through 08/14/23  Patient's copay is $0.

## 2022-06-07 NOTE — Telephone Encounter (Signed)
[facility] states they are not a [employer] with patient insurance. Order was canceled with [facility] and sent [facility] on [place]

## 2022-06-07 NOTE — Telephone Encounter (Signed)
Approval letter for Omnipod 5 G6 intro kit scanned to chart.

## 2022-06-13 ENCOUNTER — Encounter: Payer: Self-pay | Admitting: Podiatry

## 2022-06-13 DIAGNOSIS — L309 Dermatitis, unspecified: Secondary | ICD-10-CM

## 2022-06-19 MED ORDER — TRIAMCINOLONE ACETONIDE 0.1 % EX CREA: TOPICAL_CREAM | Freq: Two times a day (BID) | CUTANEOUS | Status: DC

## 2022-06-22 ENCOUNTER — Other Ambulatory Visit: Payer: Self-pay | Admitting: Critical Care Medicine

## 2022-06-22 DIAGNOSIS — E1140 Type 2 diabetes mellitus with diabetic neuropathy, unspecified: Secondary | ICD-10-CM

## 2022-06-23 ENCOUNTER — Encounter: Payer: Self-pay | Admitting: Internal Medicine

## 2022-06-23 MED ORDER — TRIAMCINOLONE ACETONIDE 0.5 % EX OINT: 1.0000 | TOPICAL_OINTMENT | Freq: Two times a day (BID) | CUTANEOUS | 0 refills | Status: AC

## 2022-06-23 NOTE — Addendum Note (Signed)
Addended byLilian Kapur, Shakti Fleer R on: 06/23/2022 04:30 PM   Modules accepted: Orders

## 2022-06-28 ENCOUNTER — Telehealth: Payer: Self-pay | Admitting: Nutrition

## 2022-06-28 NOTE — Telephone Encounter (Signed)
LVM to call me to schedule pump training 

## 2022-07-10 ENCOUNTER — Telehealth: Payer: Self-pay | Admitting: Nutrition

## 2022-07-11 NOTE — Telephone Encounter (Signed)
I have scheduled him for his dexcom training tomorrow and pump next week

## 2022-07-12 ENCOUNTER — Ambulatory Visit: Payer: Medicare Other | Admitting: Podiatry

## 2022-07-14 DIAGNOSIS — I358 Other nonrheumatic aortic valve disorders: Secondary | ICD-10-CM | POA: Insufficient documentation

## 2022-07-18 ENCOUNTER — Other Ambulatory Visit: Payer: Self-pay | Admitting: Internal Medicine

## 2022-07-18 MED ORDER — INSULIN ASPART 100 UNIT/ML IJ SOLN: INTRAMUSCULAR | 3 refills | Status: DC

## 2022-07-19 ENCOUNTER — Ambulatory Visit: Payer: Medicare Other | Admitting: Nutrition

## 2022-07-22 ENCOUNTER — Encounter: Payer: Self-pay | Admitting: Critical Care Medicine

## 2022-07-22 DIAGNOSIS — G4733 Obstructive sleep apnea (adult) (pediatric): Secondary | ICD-10-CM

## 2022-07-23 NOTE — Telephone Encounter (Signed)
Erskine Squibb can you clarify why [facility] is taking mr Perazzo's cpap away from him he will pass away without this

## 2022-07-25 ENCOUNTER — Telehealth: Payer: Self-pay

## 2022-07-25 ENCOUNTER — Ambulatory Visit: Payer: Medicare Other | Admitting: Critical Care Medicine

## 2022-07-25 NOTE — Addendum Note (Signed)
Addended by: Storm Frisk on: 07/25/2022 08:11 AM   Modules accepted: Orders

## 2022-07-25 NOTE — Telephone Encounter (Signed)
Opened in error

## 2022-07-27 ENCOUNTER — Ambulatory Visit (INDEPENDENT_AMBULATORY_CARE_PROVIDER_SITE_OTHER): Payer: Medicare Other | Admitting: Podiatry

## 2022-07-27 DIAGNOSIS — S91209A Unspecified open wound of unspecified toe(s) with damage to nail, initial encounter: Secondary | ICD-10-CM | POA: Diagnosis not present

## 2022-07-27 DIAGNOSIS — L97521 Non-pressure chronic ulcer of other part of left foot limited to breakdown of skin: Secondary | ICD-10-CM | POA: Diagnosis not present

## 2022-07-31 NOTE — Progress Notes (Signed)
  Subjective:  Patient ID: Austin Vaughan, male    DOB: 1971/01/21,  MRN: [id number]  Chief Complaint  Patient presents with   Foot Ulcer    2 month follow up left foot, nail check      51 y.o. male returns for post-op check.  He is doing well says it has healed  Review of Systems: Negative except as noted in the HPI. Denies N/V/F/Ch.   Objective:  There were no vitals filed for this visit. There is no height or weight on file to calculate BMI. Constitutional Well developed. Well nourished.  Vascular Foot warm and well perfused. Capillary refill normal to all digits.  Calf is soft and supple, no posterior calf or knee pain, negative Homans' sign  Neurologic Normal speech. Oriented to person, place, and time. Epicritic sensation to light touch grossly present bilaterally.  Dermatologic Previous hide of ulceration is well-healed there is good healing of the avulsion site with regrowth of nail plate  Orthopedic: He has little to no pain to palpation     Assessment:   1. Ulcer of great toe, left, limited to breakdown of skin ([facility])   2. Traumatic avulsion of nail plate of toe, initial encounter     Plan:  Patient was evaluated and treated and all questions answered.  S/p foot surgery left -Site is nearly fully healed.  He will leave OTA and apply gentamicin which I refilled.  Regarding his right hallux he had a near complete partial avulsion of the hallux there is bleeding no signs of infection.  I recommended avulsion of the loose portions of the nail plate today.  This was completed following prep with Betadine.  He tolerated this well.  Post care instructions were given he will let me know if he has any further issues otherwise I will see him for follow-up as scheduled  No follow-ups on file.

## 2022-08-02 ENCOUNTER — Ambulatory Visit: Payer: Medicare Other | Admitting: Nutrition

## 2022-08-16 ENCOUNTER — Encounter: Payer: Medicare Other | Attending: Internal Medicine | Admitting: Nutrition

## 2022-08-16 DIAGNOSIS — E1142 Type 2 diabetes mellitus with diabetic polyneuropathy: Secondary | ICD-10-CM | POA: Insufficient documentation

## 2022-08-16 DIAGNOSIS — Z794 Long term (current) use of insulin: Secondary | ICD-10-CM | POA: Insufficient documentation

## 2022-08-18 ENCOUNTER — Telehealth: Payer: Self-pay | Admitting: Nutrition

## 2022-08-18 NOTE — Patient Instructions (Signed)
REad over starter booklet tonight as well as owner's manuel Call pump company if questions/problems about how to use your pump Call dexcom if questions about CGM Call office if  blood sugars remain over 250, or if blood sugars drop low. Return to see Dr. Kelton Pillar in one month.

## 2022-08-18 NOTE — Progress Notes (Signed)
He is here today with both a new OmniPod 5 pump and a OmniPod dash pump.  He did not set up a OmniPod account or or [employer] account.  We set up a [employer] central account, but did not link his pump to [employer], because his podder's central account did not have him listed as having a OmniPod 5 pump.  I called customer service and they said this will take 1-2 days to register this pump, and then I will link him to a [employer] account on Monday when I return to work. Patient was trained on the use of the OmniPod 5 insulin pump.  We discussed how this pump will deliver insulin and the fact that he will no longer need his Lantus insulin.  Settings were put into the pump per Dr. Quin Hoop orders:  Basal rate: 1.5u/hr, ISF: 20, I/C 1 (patient does not know how to count carbs.  Gives 30u ac and adjusts this dose by 2-5u if eating more/less) target: 120 with correction over 130, max bolus 30 (pt. Was shown how to take added bolus due to fact that pump max bolus is 30u.)duration: 4 hours, max basal: 3.0u.   He is currently wearing a dexcom G 6.  No clarity app.  This was downloaded to his phone and linked to [place] and then linked to his pod.    He filled a pod with Novolog insulin, and we discussed proper placement of pod with reference to his Dexcom.  He attached it to his upper right abdomen 6 inches away from his dexcom sensor.  Discussed importance of wearing this pod on his upper arm, but did not want to do this at this time.  Showed appropriate placement of this pod to his arm areas.   Pump was put in automated mode and he was trained on how to give a bolus and correction bolus.  He reported good understanding of this.  I stressed need to giving bolus before all meals and snacks eaten and he reported good understanding of this.  We discussed all topics on the pump start checklist, and he signed the form indication good understanding of all topics wiht no final questions.

## 2022-08-18 NOTE — Telephone Encounter (Signed)
Patient reported no difficulty giving boluses.  Says FBS today was 246.  Reports having given boluses for supper and breakfast today without difficulty, putting in blood sugar readings with each bolus. Told him I would call him tomorrow to see what his FBS is, and if over 150, will have him call the office with a possible need to change pump settings.  Pt. Is linked to [employer]:  Password: [email or web address]                                       Password: [id number]

## 2022-08-23 ENCOUNTER — Ambulatory Visit: Payer: Medicare Other | Admitting: Nutrition

## 2022-09-05 ENCOUNTER — Encounter: Payer: Self-pay | Admitting: Critical Care Medicine

## 2022-09-06 ENCOUNTER — Telehealth: Payer: Self-pay | Admitting: Nutrition

## 2022-09-06 ENCOUNTER — Encounter: Payer: Medicare Other | Admitting: Nutrition

## 2022-09-06 DIAGNOSIS — E1142 Type 2 diabetes mellitus with diabetic polyneuropathy: Secondary | ICD-10-CM

## 2022-09-06 DIAGNOSIS — Z794 Long term (current) use of insulin: Secondary | ICD-10-CM | POA: Diagnosis present

## 2022-09-06 NOTE — Patient Instructions (Signed)
Give 2 boluses if amount exceeds 30u Call when new insulin come in to make changes to PDM settings before you begin this insulin

## 2022-09-06 NOTE — Telephone Encounter (Signed)
He is having to fill his pod every day.  Can we order him the U-200 insulin?

## 2022-09-06 NOTE — Progress Notes (Signed)
Patient is here today to change out his PDM.  This was linked to [place] and to his dexcom.   He was never giving more than his 30u meal time insulin, because it was too much trouble to give two boluses, and he wanted his pod to last more than 1.2 days.   I explained that by doing this, the blood sugar will never come down to the target of 120.  I suggested that we can change his insulin to a more potent insulin, that would allow him to only take 15u bolus and he would be able to give the correction dose without going over 30 unit max allowed by his pump.  He agreed to use this.   Settings were transferred from his other PDM per Dr. Quin Hoop starting orders for him.  Basal rate: 1.5u/hr, ISF: 20, I/C: 1 (30u per meals), target: 120 with correction over 130, max basal rate: 3.0, max bolus 30.   He filled a pod with Novolog insulin and we again discussed proper pod placement with reference to CGM placement.  He had no final questions.

## 2022-09-07 ENCOUNTER — Encounter: Payer: Self-pay | Admitting: Critical Care Medicine

## 2022-09-07 DIAGNOSIS — G4733 Obstructive sleep apnea (adult) (pediatric): Secondary | ICD-10-CM

## 2022-09-07 MED ORDER — HUMALOG KWIKPEN 200 UNIT/ML ~~LOC~~ SOPN: PEN_INJECTOR | SUBCUTANEOUS | 3 refills | Status: DC

## 2022-09-11 NOTE — Telephone Encounter (Signed)
Sending referral for surgical evaluation for Inspire placement

## 2022-09-12 ENCOUNTER — Telehealth: Payer: Self-pay | Admitting: Nutrition

## 2022-09-12 NOTE — Telephone Encounter (Signed)
Patient reports that he did not pick up the U-200 insulin because it needs a prior auth. For this.  Please do this.  The reason being is that he is using a pump pod every day, and this will enable him to use one pod every 2-3 days.   Thank you

## 2022-09-13 ENCOUNTER — Other Ambulatory Visit: Payer: Self-pay | Admitting: Critical Care Medicine

## 2022-09-13 DIAGNOSIS — E1140 Type 2 diabetes mellitus with diabetic neuropathy, unspecified: Secondary | ICD-10-CM

## 2022-09-19 ENCOUNTER — Other Ambulatory Visit: Payer: Self-pay | Admitting: Internal Medicine

## 2022-09-27 ENCOUNTER — Other Ambulatory Visit: Payer: Self-pay | Admitting: Critical Care Medicine

## 2022-09-29 ENCOUNTER — Encounter (HOSPITAL_BASED_OUTPATIENT_CLINIC_OR_DEPARTMENT_OTHER): Payer: Medicare Other | Admitting: Critical Care Medicine

## 2022-09-29 DIAGNOSIS — N522 Drug-induced erectile dysfunction: Secondary | ICD-10-CM | POA: Diagnosis not present

## 2022-09-29 DIAGNOSIS — U071 COVID-19: Secondary | ICD-10-CM

## 2022-09-29 MED ORDER — NIRMATRELVIR/RITONAVIR (PAXLOVID)TABLET: 3.0000 | ORAL_TABLET | Freq: Two times a day (BID) | ORAL | 0 refills | Status: AC

## 2022-09-29 NOTE — Telephone Encounter (Signed)
Outpatient Oral COVID Treatment Note  I connected with Austin Vaughan on 09/29/2022/4:14 PM by [facility] and verified that I am speaking with the correct person using two identifiers.  I discussed the limitations, risks, security, and privacy concerns of performing an evaluation and management service by telephone and the availability of in person appointments. I also discussed with the patient that there may be a patient responsible charge related to this service. The patient expressed understanding and agreed to proceed.  Patient location: HOME  Provider location: HOME  Diagnosis: COVID-19 infection  Purpose of visit: Discussion of potential use of Molnupiravir or Paxlovid, a new treatment for mild to moderate COVID-19 viral infection in non-hospitalized patients.   Subjective: Patient is a 52 y.o. male who has been diagnosed with COVID 19 viral infection.  Their symptoms began on 09/28/22 with COUGH SHORT OF BREATH FEVER MYALGIAS.    Past Medical History:  Diagnosis Date   Coronary artery disease    Diabetes mellitus without complication ([place])    Diabetic foot ulcer ([place]) 08/23/2019   GERD (gastroesophageal reflux disease) 06/21/2021   Hypertension    Sleep apnea     Allergies  Allergen Reactions   Ambien [Zolpidem Tartrate] Other (See Comments)    HALLUCINATIONS   Shellfish Allergy Anaphylaxis    Seafood     Current Outpatient Medications:    nirmatrelvir/ritonavir (PAXLOVID) 20 x 150 MG & 10 x 100MG TABS, Take 3 tablets by mouth 2 (two) times daily for 5 days. (Take nirmatrelvir 150 mg two tablets twice daily for 5 days and ritonavir 100 mg one tablet twice daily for 5 days) Patient GFR is > 60, Disp: 30 tablet, Rfl: 0   albuterol (VENTOLIN HFA) 108 (90 Base) MCG/ACT inhaler, TAKE 2 PUFFS BY MOUTH EVERY 6 HOURS AS NEEDED FOR WHEEZE OR SHORTNESS OF BREATH (Patient taking differently: Inhale 2 puffs into the lungs every 6 (six) hours as needed for wheezing or shortness of breath.),  Disp: 8.5 each, Rfl: 11   aspirin EC 81 MG tablet, Take 81 mg by mouth daily., Disp: , Rfl:    atorvastatin (LIPITOR) 80 MG tablet, Take 1 tablet (80 mg total) by mouth daily., Disp: 90 tablet, Rfl: 3   cetirizine (ZYRTEC) 10 MG tablet, Take 1 tablet (10 mg total) by mouth daily., Disp: 30 tablet, Rfl: 11   Continuous Blood Gluc Sensor (DEXCOM G6 SENSOR) MISC, 1 Device by Does not apply route as directed., Disp: 3 each, Rfl: 11   Continuous Blood Gluc Transmit (DEXCOM G6 TRANSMITTER) MISC, 1 Device by Does not apply route as directed., Disp: 1 each, Rfl: 3   dapagliflozin propanediol (FARXIGA) 10 MG TABS tablet, Take 1 tablet (10 mg total) by mouth daily before breakfast., Disp: 90 tablet, Rfl: 2   dicyclomine (BENTYL) 20 MG tablet, TAKE 1 TABLET (20 MG TOTAL) BY MOUTH 4 (FOUR) TIMES DAILY AS NEEDED (ABDOMINAL CRAMPING)., Disp: 360 tablet, Rfl: 1   EPINEPHrine (EPIPEN JR) 0.15 MG/0.3ML injection, Inject 0.15 mg into the muscle as needed for anaphylaxis., Disp: 2 each, Rfl: 2   gabapentin (NEURONTIN) 600 MG tablet, Take 1 tablet (600 mg total) by mouth 2 (two) times daily AND 2 tablets (1,200 mg total) at bedtime., Disp: 120 tablet, Rfl: 0   gentamicin ointment (GARAMYCIN) 0.1 %, Apply 1 Application topically daily., Disp: 30 g, Rfl: 0   hydrOXYzine (VISTARIL) 25 MG capsule, Take 1 capsule (25 mg total) by mouth every 8 (eight) hours as needed for itching., Disp: 60 capsule,  Rfl: 0   Insulin Disposable Pump (OMNIPOD 5 G6 INTRO, GEN 5,) KIT, 1 Device by Does not apply route every other day., Disp: 1 kit, Rfl: 0   Insulin Disposable Pump (OMNIPOD 5 G6 INTRO, GEN 5,) KIT, 1 Device by Does not apply route every other day., Disp: 1 kit, Rfl: 0   Insulin Disposable Pump (OMNIPOD 5 G6 POD, GEN 5,) MISC, 1 Device by Does not apply route every other day., Disp: 9 each, Rfl: 2   insulin lispro (HUMALOG KWIKPEN) 200 UNIT/ML KwikPen, Max daily 100 units, Disp: 15 mL, Rfl: 3   metFORMIN (GLUCOPHAGE) 1000 MG  tablet, Take 1 tablet (1,000 mg total) by mouth daily with breakfast., Disp: 90 tablet, Rfl: 2   metoprolol succinate (TOPROL-XL) 100 MG 24 hr tablet, Take 1 tablet (100 mg total) by mouth daily. Take with or immediately following a meal., Disp: 90 tablet, Rfl: 3   montelukast (SINGULAIR) 10 MG tablet, Take 1 tablet (10 mg total) by mouth daily., Disp: 90 tablet, Rfl: 2   nitroGLYCERIN (NITRODUR - DOSED IN MG/24 HR) 0.4 mg/hr patch, Place 1 patch (0.4 mg total) onto the skin daily., Disp: 30 patch, Rfl: 12   nitroGLYCERIN (NITROSTAT) 0.4 MG SL tablet, PLACE 1 TABLET UNDER THE TONGUE EVERY 5 MINUTES AS NEEDED FOR CHEST PAIN. (Patient taking differently: Place 0.4 mg under the tongue every 5 (five) minutes as needed for chest pain.), Disp: 25 tablet, Rfl: 3   nortriptyline (PAMELOR) 50 MG capsule, TAKE 1 CAPSULE BY MOUTH EVERY DAY AT BEDTIME, Disp: 90 capsule, Rfl: 0   pantoprazole (PROTONIX) 40 MG tablet, Take 1 tablet (40 mg total) by mouth daily., Disp: 90 tablet, Rfl: 3   pregabalin (LYRICA) 300 MG capsule, Take 1 capsule (300 mg total) by mouth daily., Disp: 60 capsule, Rfl: 2   ramipril (ALTACE) 10 MG capsule, Take 1 capsule (10 mg total) by mouth daily., Disp: 90 capsule, Rfl: 3   rOPINIRole (REQUIP) 4 MG tablet, TAKE 1 TABLET(4 MG) BY MOUTH AT BEDTIME, Disp: 30 tablet, Rfl: 0   triamcinolone ointment (KENALOG) 0.5 %, Apply 1 Application topically 2 (two) times daily., Disp: 30 g, Rfl: 0  Objective: Mychart encounter  Laboratory Data:  No results found for this or any previous visit (from the past 2160 hour(s)).   Assessment: 52 y.o. male with mild/moderate COVID 19 viral infection diagnosed on 09/29/22 at high risk for progression to severe COVID 19.  Plan:  This patient is a 52 y.o. male that meets the following criteria for Emergency Use Authorization of: Paxlovid 1. Age >12 yr AND > 40 kg 2. SARS-COV-2 positive test 3. Symptom onset < 5 days 4. Mild-to-moderate COVID disease with high  risk for severe progression to hospitalization or death  I have spoken and communicated the following to the patient or parent/caregiver regarding: Paxlovid is an unapproved drug that is authorized for use under an Emergency Use Authorization.  There are no adequate, approved, available products for the treatment of COVID-19 in adults who have mild-to-moderate COVID-19 and are at high risk for progressing to severe COVID-19, including hospitalization or death. Other therapeutics are currently authorized. For additional information on all products authorized for treatment or prevention of COVID-19, please see [email or web address].  There are benefits and risks of taking this treatment as outlined in the "Fact Sheet for Patients and Caregivers."  "Fact Sheet for Patients and Caregivers" was reviewed with patient. A hard copy will be provided to patient from pharmacy prior to the  patient receiving treatment. Patients should continue to self-isolate and use infection control measures (e.g., wear mask, isolate, social distance, avoid sharing personal items, clean and disinfect "high touch" surfaces, and frequent handwashing) according to CDC guidelines.  The patient or parent/caregiver has the option to accept or refuse treatment. Patient medication history was reviewed for potential drug interactions:Interaction with home meds: atorvastatin pt told to HOLD while on paxlovid Patient's GFR was calculated to be 60, and they were therefore prescribed Normal dose (GFR>60) - nirmatrelvir 126m tab (2 tablet) by mouth twice daily AND ritonavir 1077mtab (1 tablet) by mouth twice daily  After reviewing above information with the patient, the patient agrees to receive Paxlovid.  Follow up instructions:    Take prescription BID x 5 days as directed Reach out to pharmacist for counseling on medication if  desired For concerns regarding further COVID symptoms please follow up with your PCP or urgent care For urgent or life-threatening issues, seek care at your local emergency department  The patient was provided an opportunity to ask questions, and all were answered. The patient agreed with the plan and demonstrated an understanding of the instructions.   [facility].  The patient was advised to call their PCP or seek an in-person evaluation if the symptoms worsen or if the condition fails to improve as anticipated.   I provided 15 minutes of non face-to-face telephone visit time during this encounter, and > 50% was spent counseling as documented under my assessment & plan.  PaAsencion NobleMD 09/29/2022 /4:14 PM

## 2022-10-03 ENCOUNTER — Other Ambulatory Visit (HOSPITAL_COMMUNITY): Payer: Self-pay

## 2022-10-03 ENCOUNTER — Telehealth: Payer: Self-pay | Admitting: Pharmacy Technician

## 2022-10-03 NOTE — Telephone Encounter (Signed)
PA for Humalog u200 approved. (That's what I saw on the med list) I wasn't able to pull up Lyumjev 200

## 2022-10-03 NOTE — Telephone Encounter (Signed)
I think you sent this to [facility] by mistake.

## 2022-10-03 NOTE — Telephone Encounter (Signed)
Pharmacy Patient Advocate Encounter   Received notification from Pt calls msgs/CMA that prior authorization for Humalog u200 is required/requested.   PA submitted on 10/03/22 to (ins) [employer] via [place] Status is pending

## 2022-10-03 NOTE — Telephone Encounter (Signed)
Opened in error

## 2022-10-03 NOTE — Telephone Encounter (Signed)
Pharmacy Patient Advocate Encounter  Prior Authorization for Humalog u200 has been approved.    Effective dates:  through 08/14/23  Spoke with Pharmacy to process.

## 2022-10-04 MED ORDER — TADALAFIL 10 MG PO TABS: 10.0000 mg | ORAL_TABLET | ORAL | 1 refills | Status: AC | PRN

## 2022-10-04 NOTE — Addendum Note (Signed)
Addended by: Elsie Stain on: 10/04/2022 06:27 AM   Modules accepted: Orders

## 2022-10-04 NOTE — Telephone Encounter (Signed)
Please see the MyChart message reply(ies) for my assessment and plan.    This patient gave consent for this Medical Advice Message and is aware that it may result in a bill to [employer], as well as the possibility of receiving a bill for a co-payment or deductible. They are an established patient, but are not seeking medical advice exclusively about a problem treated during an in person or video visit in the last seven days. I did not recommend an in person or video visit within seven days of my reply.    I spent a total of 8 minutes cumulative time within 7 days through [employer].  Asencion Noble, MD

## 2022-10-16 ENCOUNTER — Encounter: Payer: Self-pay | Admitting: Critical Care Medicine

## 2022-10-16 DIAGNOSIS — H9193 Unspecified hearing loss, bilateral: Secondary | ICD-10-CM

## 2022-11-09 ENCOUNTER — Other Ambulatory Visit: Payer: Self-pay | Admitting: Critical Care Medicine

## 2022-11-27 ENCOUNTER — Ambulatory Visit: Payer: Medicare Other | Admitting: Internal Medicine

## 2022-12-02 ENCOUNTER — Other Ambulatory Visit: Payer: Self-pay | Admitting: Critical Care Medicine

## 2022-12-04 NOTE — Telephone Encounter (Signed)
Requested medication (s) are due for refill today: yes  Requested medication (s) are on the active medication list: yes Last refill:  10/04/22   Future visit scheduled: no  Notes to clinic:  Unable to refill per protocol, courtesy refill already given, routing for provider approval.      Requested Prescriptions  Pending Prescriptions Disp Refills   rOPINIRole (REQUIP) 4 MG tablet [Pharmacy Med Name: ROPINIROLE HCL 4 MG TABLET] 90 tablet     Sig: TAKE 1 TABLET BY MOUTH EVERYDAY AT BEDTIME     Neurology:  Parkinsonian Agents Passed - 12/02/2022  3:44 AM      Passed - Last BP in normal range    BP Readings from Last 1 Encounters:  05/22/22 132/84         Passed - Last Heart Rate in normal range    Pulse Readings from Last 1 Encounters:  05/22/22 95         Passed - Valid encounter within last 12 months    Recent Outpatient Visits           9 months ago Type 2 diabetes mellitus with diabetic polyneuropathy, with long-term current use of insulin (HCC)   [place] [facility] Storm Frisk, MD   1 year ago Type 2 diabetes mellitus with diabetic polyneuropathy, with long-term current use of insulin [facility])   [place] [facility] Storm Frisk, MD   2 years ago Type 2 diabetes mellitus with diabetic polyneuropathy, with long-term current use of insulin [facility])   [place] [facility] Storm Frisk, MD   2 years ago Type 2 diabetes mellitus with diabetic polyneuropathy, with long-term current use of insulin [facility])    [facility] Storm Frisk, MD   2 years ago Mild persistent asthma, unspecified whether complicated   [place] [facility] at [facility], [place], [place]

## 2022-12-07 ENCOUNTER — Encounter: Payer: Self-pay | Admitting: Podiatry

## 2022-12-11 ENCOUNTER — Other Ambulatory Visit: Payer: Self-pay | Admitting: Critical Care Medicine

## 2022-12-12 NOTE — Telephone Encounter (Signed)
Rx needs to be sent to PCP, Kennieth Rad, MD   Requested Prescriptions  Pending Prescriptions Disp Refills   montelukast (SINGULAIR) 10 MG tablet [Pharmacy Med Name: MONTELUKAST SOD 10 MG TABLET] 90 tablet 1    Sig: TAKE 1 TABLET BY MOUTH EVERY DAY     Pulmonology:  Leukotriene Inhibitors Passed - 12/11/2022 12:30 PM      Passed - Valid encounter within last 12 months    Recent Outpatient Visits           9 months ago Type 2 diabetes mellitus with diabetic polyneuropathy, with long-term current use of insulin (HCC)   [place] [facility] Storm Frisk, MD   1 year ago Type 2 diabetes mellitus with diabetic polyneuropathy, with long-term current use of insulin [facility])   [place] [facility] Storm Frisk, MD   2 years ago Type 2 diabetes mellitus with diabetic polyneuropathy, with long-term current use of insulin [facility])   [place] [facility] Storm Frisk, MD   2 years ago Type 2 diabetes mellitus with diabetic polyneuropathy, with long-term current use of insulin [facility])   [place] [facility] Storm Frisk, MD   2 years ago Mild persistent asthma, unspecified whether complicated   [facility] at [facility], [place], [place]

## 2022-12-15 ENCOUNTER — Other Ambulatory Visit: Payer: Self-pay | Admitting: Critical Care Medicine

## 2022-12-20 ENCOUNTER — Encounter: Payer: Self-pay | Admitting: Critical Care Medicine

## 2022-12-22 ENCOUNTER — Telehealth: Payer: Self-pay | Admitting: Critical Care Medicine

## 2022-12-22 NOTE — Telephone Encounter (Signed)
Contacted Austin Vaughan to schedule their annual wellness visit. Appointment made for 12/26/2022.  Thank you,  [facility] Support [facility] Direct dial  [phone]

## 2022-12-26 ENCOUNTER — Ambulatory Visit: Payer: Medicare Other | Attending: Critical Care Medicine

## 2022-12-26 VITALS — Ht 74.0 in | Wt 260.0 lb

## 2022-12-26 DIAGNOSIS — Z0000 Encounter for general adult medical examination without abnormal findings: Secondary | ICD-10-CM

## 2022-12-26 NOTE — Progress Notes (Signed)
Subjective:   Austin Vaughan is a 52 y.o. male who presents for Medicare Annual/Subsequent preventive examination.  I connected with  Austin Vaughan on 12/26/22 by a audio enabled telemedicine application and verified that I am speaking with the correct person using two identifiers.  Patient Location: Home  Provider Location: Home Office  I discussed the limitations of evaluation and management by telemedicine. The patient expressed understanding and agreed to proceed.  Review of Systems     Cardiac Risk Factors include: advanced age (>37men, >39 women);diabetes mellitus;dyslipidemia;hypertension;male gender;sedentary lifestyle     Objective:    Today's Vitals   12/26/22 1911  Weight: 260 lb (117.9 kg)  Height: 6\' 2"  (1.88 m)   Body mass index is 33.38 kg/m.     12/26/2022    7:15 PM 03/09/2022    1:51 AM 12/13/2021    8:48 PM 12/02/2021    9:04 AM 10/02/2021    8:28 PM 10/31/2019   10:28 AM 08/24/2019   12:36 AM  Advanced Directives  Does Patient Have a Medical Advance Directive? No No No No No No No  Would patient like information on creating a medical advance directive? Yes (MAU/Ambulatory/Procedural Areas - Information given) No - Patient declined No - Patient declined Yes (Inpatient - patient defers creating a medical advance directive at this time - Information given) No - Patient declined  No - Patient declined    Current Medications (verified) Outpatient Encounter Medications as of 12/26/2022  Medication Sig   albuterol (VENTOLIN HFA) 108 (90 Base) MCG/ACT inhaler TAKE 2 PUFFS BY MOUTH EVERY 6 HOURS AS NEEDED FOR WHEEZE OR SHORTNESS OF BREATH (Patient taking differently: Inhale 2 puffs into the lungs every 6 (six) hours as needed for wheezing or shortness of breath.)   aspirin EC 81 MG tablet Take 81 mg by mouth daily.   atorvastatin (LIPITOR) 80 MG tablet Take 1 tablet (80 mg total) by mouth daily.   cetirizine (ZYRTEC) 10 MG tablet Take 1 tablet (10 mg total) by mouth  daily.   Continuous Blood Gluc Sensor (DEXCOM G6 SENSOR) MISC 1 Device by Does not apply route as directed.   Continuous Blood Gluc Transmit (DEXCOM G6 TRANSMITTER) MISC 1 Device by Does not apply route as directed.   dapagliflozin propanediol (FARXIGA) 10 MG TABS tablet Take 1 tablet (10 mg total) by mouth daily before breakfast.   dicyclomine (BENTYL) 20 MG tablet TAKE 1 TABLET (20 MG TOTAL) BY MOUTH 4 (FOUR) TIMES DAILY AS NEEDED (ABDOMINAL CRAMPING).   EPINEPHrine (EPIPEN JR) 0.15 MG/0.3ML injection Inject 0.15 mg into the muscle as needed for anaphylaxis.   gentamicin ointment (GARAMYCIN) 0.1 % Apply 1 Application topically daily.   hydrOXYzine (VISTARIL) 25 MG capsule Take 1 capsule (25 mg total) by mouth every 8 (eight) hours as needed for itching.   Insulin Disposable Pump (OMNIPOD 5 G6 INTRO, GEN 5,) KIT 1 Device by Does not apply route every other day.   Insulin Disposable Pump (OMNIPOD 5 G6 INTRO, GEN 5,) KIT 1 Device by Does not apply route every other day.   Insulin Disposable Pump (OMNIPOD 5 G6 POD, GEN 5,) MISC 1 Device by Does not apply route every other day.   insulin lispro (HUMALOG KWIKPEN) 200 UNIT/ML KwikPen Max daily 100 units   metFORMIN (GLUCOPHAGE) 1000 MG tablet Take 1 tablet (1,000 mg total) by mouth daily with breakfast.   metoprolol succinate (TOPROL-XL) 100 MG 24 hr tablet Take 1 tablet (100 mg total) by mouth daily.  Take with or immediately following a meal.   montelukast (SINGULAIR) 10 MG tablet TAKE 1 TABLET BY MOUTH EVERY DAY   nitroGLYCERIN (NITRODUR - DOSED IN MG/24 HR) 0.4 mg/hr patch Place 1 patch (0.4 mg total) onto the skin daily.   nitroGLYCERIN (NITROSTAT) 0.4 MG SL tablet PLACE 1 TABLET UNDER THE TONGUE EVERY 5 MINUTES AS NEEDED FOR CHEST PAIN. (Patient taking differently: Place 0.4 mg under the tongue every 5 (five) minutes as needed for chest pain.)   nortriptyline (PAMELOR) 50 MG capsule TAKE 1 CAPSULE BY MOUTH EVERY DAY AT BEDTIME   pantoprazole  (PROTONIX) 40 MG tablet Take 1 tablet (40 mg total) by mouth daily.   pregabalin (LYRICA) 300 MG capsule Take 1 capsule (300 mg total) by mouth daily.   ramipril (ALTACE) 10 MG capsule Take 1 capsule (10 mg total) by mouth daily.   rOPINIRole (REQUIP) 4 MG tablet TAKE 1 TABLET(4 MG) BY MOUTH AT BEDTIME   tadalafil (CIALIS) 10 MG tablet Take 1 tablet (10 mg total) by mouth every other day as needed for erectile dysfunction.   triamcinolone ointment (KENALOG) 0.5 % Apply 1 Application topically 2 (two) times daily.   gabapentin (NEURONTIN) 600 MG tablet Take 1 tablet (600 mg total) by mouth 2 (two) times daily AND 2 tablets (1,200 mg total) at bedtime.   No facility-administered encounter medications on file as of 12/26/2022.    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   Procedure: GREAT TOE AMPUTATION;  Surgeon: Edwin Cap, DPM;  Location: [facility] OR;  Service: Podiatry;  Laterality: Left;   Family History  Problem Relation Age of Onset   ALS Mother    Lung cancer Father    ALS Maternal Aunt    Heart disease Paternal Grandmother    Heart disease Paternal Grandfather    Social History   Socioeconomic History   Marital status: Married    Spouse name: Not on file   Number of children: 2   Years of education: 12   Highest education level: Not on file  Occupational History   Occupation: disablitiy  Tobacco Use   Smoking status: Never   Smokeless tobacco: Never  Vaping Use   Vaping Use: Never used  Substance and Sexual Activity   Alcohol use: Not  Currently   Drug use: Never   Sexual activity: Yes  Other Topics Concern   Not on file  Social History Narrative   Wife and patient relocated to [place] from [place] a few months ago. - 06/11/18   He is on disability since 2019.     He lives with wife and daughter in a one-level apartment.    Right handed   Social Determinants of Health   Financial Resource Strain: Medium Risk (12/26/2022)   Overall Financial Resource Strain (CARDIA)    Difficulty of Paying Living Expenses: Somewhat hard  Food Insecurity: Food Insecurity Present (12/26/2022)   Hunger Vital Sign    Worried About Running Out of Food in the Last Year: Sometimes true    Ran Out of Food in the Last Year:  Sometimes true  Transportation Needs: No Transportation Needs (12/26/2022)   PRAPARE - Administrator, Civil Service (Medical): No    Lack of Transportation (Non-Medical): No  Physical Activity: Inactive (12/26/2022)   Exercise Vital Sign    Days of Exercise per Week: 0 days    Minutes of Exercise per Session: 0 min  Stress: No Stress Concern Present (12/26/2022)   [employer] of Occupational Health - Occupational Stress Questionnaire    Feeling of Stress : Not at all  Social Connections: Socially Isolated (12/26/2022)   Social Connection and Isolation Panel [NHANES]    Frequency of Communication with Friends and Family: Never    Frequency of Social Gatherings with Friends and Family: Never    Attends Religious Services: Never    Database administrator or Organizations: No    Attends Engineer, structural: Never    Marital Status: Married    Tobacco Counseling Counseling given: Not Answered   Clinical Intake:  Pre-visit preparation completed: Yes  Pain : No/denies pain  Diabetes: Yes CBG done?: No Did pt. bring in CBG monitor from home?: No  How often do you need to have someone help you when you read instructions, pamphlets, or other written materials from your doctor or  pharmacy?: 1 - Never  Diabetic?Yes   Nutrition Risk Assessment:  Has the patient had any N/V/D within the last 2 months?  No  Does the patient have any non-healing wounds?  No  Has the patient had any unintentional weight loss or weight gain?  Yes   Diabetes:  Is the patient diabetic?  Yes  If diabetic, was a CBG obtained today?  No  Did the patient bring in their glucometer from home?  No  How often do you monitor your CBG's? Has cgm.   Financial Strains and Diabetes Management:  Are you having any financial strains with the device, your supplies or your medication?  Increased cost with some diabetic meds copay around $30 each .  Does the patient want to be seen by Chronic Care Management for management of their diabetes?  No  Would the patient like to be referred to a Nutritionist or for Diabetic Management?  No   Diabetic Exams:  Diabetic Eye Exam: Completed but patient unsure of name of provider Diabetic Foot Exam: Completed 02/22/22   Interpreter Needed?: No  Information entered by :: Kandis Fantasia LPN   Activities of Daily Living    12/26/2022    7:15 PM 12/25/2022    2:52 PM  In your present state of health, do you have any difficulty performing the following activities:  Hearing? 0 0  Vision? 0 0  Difficulty concentrating or making decisions? 1 1  Walking or climbing stairs? 1 1  Dressing or bathing? 0 0  Doing errands, shopping? 0 0  Preparing Food and eating ? N N  Using the Toilet? N N  In the past six months, have you accidently leaked urine? N N  Do you have problems with loss of bowel control? N N  Managing your Medications? N N  Managing your Finances? N Y  Housekeeping or managing your Housekeeping? Y N    Patient Care Team: Storm Frisk, MD as PCP - General (Pulmonary Disease) Chilton Si, MD as PCP - Cardiology (Cardiology) Glendale Chard, DO as Consulting Physician (Neurology) [facility], Konrad Dolores, MD as Attending  Physician (Endocrinology) Enrique Sack, DPM as Consulting Physician (Podiatry) Candelaria Stagers, DPM as Consulting  Physician (Podiatry)  Indicate any recent Medical Services you may have received from other than [facility] providers in the past year (date may be approximate).     Assessment:   This is a routine wellness examination for Yoshiro.  Hearing/Vision screen Hearing Screening - Comments:: Denies hearing difficulties   Vision Screening - Comments:: Wears rx glasses - up to date with routine eye exams with provider in [place], [place]    Dietary issues and exercise activities discussed: Current Exercise Habits: The patient does not participate in regular exercise at present   Goals Addressed             This Visit's Progress    Increase physical activity        Depression Screen    12/26/2022    7:14 PM 02/22/2022   11:45 AM 12/02/2021    9:04 AM 11/03/2020    9:41 AM 09/23/2020    9:17 AM 03/27/2019    4:00 PM 10/31/2018    3:03 PM  PHQ 2/9 Scores  PHQ - 2 Score 0 2 0 3 4 4 6   PHQ- 9 Score  12  14 17 19 25     Fall Risk    12/26/2022    7:12 PM 12/25/2022    2:52 PM 02/22/2022   11:45 AM 12/02/2021    9:04 AM 11/30/2021    1:02 AM  Fall Risk   Falls in the past year? 0 0 0 0 1  Number falls in past yr: 0  0 0 1  Injury with Fall? 0 0 0 0 1  Risk for fall due to : Impaired balance/gait;Impaired mobility  No Fall Risks    Follow up Falls prevention discussed;Education provided;Falls evaluation completed        FALL RISK PREVENTION PERTAINING TO THE HOME:  Any stairs in or around the home? No  If so, are there any without handrails? No  Home free of loose throw rugs in walkways, pet beds, electrical cords, etc? Yes  Adequate lighting in your home to reduce risk of falls? Yes   ASSISTIVE DEVICES UTILIZED TO PREVENT FALLS:  Life alert? No  Use of a cane, walker or w/c? Yes  Grab bars in the bathroom? Yes  Shower chair or bench in shower? No  Elevated toilet seat or a  handicapped toilet? Yes   TIMED UP AND GO:  Was the test performed? No . Telephonic visit   Cognitive Function:    12/02/2021    9:05 AM  MMSE - Mini Mental State Exam  Orientation to time 5  Orientation to Place 5  Registration 3  Attention/ Calculation 5  Recall 0  Language- name 2 objects 2  Language- repeat 1  Language- follow 3 step command 3  Language- read & follow direction 1  Write a sentence 1  Copy design 1  Total score 27        12/26/2022    7:16 PM  6CIT Screen  What Year? 0 points  What month? 0 points  What time? 0 points  Count back from 20 0 points  Months in reverse 0 points  Repeat phrase 0 points  Total Score 0 points    Immunizations Immunization History  Administered Date(s) Administered   Influenza,inj,Quad PF,6+ Mos 11/03/2020, 06/17/2021   Influenza-Unspecified 06/17/2021   PFIZER(Purple Top)SARS-COV-2 Vaccination 01/13/2020, 02/10/2020, 08/11/2020   Pfizer Covid-19 Vaccine Bivalent Booster 49yrs & up 06/17/2021   Pneumococcal Conjugate-13 11/03/2020   Pneumococcal Polysaccharide-23 07/31/2018   Tdap  07/31/2018    TDAP status: Up to date  Flu Vaccine status: Up to date  Pneumococcal vaccine status: Up to date  Covid-19 vaccine status: Information provided on how to obtain vaccines.   Qualifies for Shingles Vaccine? Yes   Zostavax completed No   Shingrix Completed?: No.    Education has been provided regarding the importance of this vaccine. Patient has been advised to call insurance company to determine out of pocket expense if they have not yet received this vaccine. Advised may also receive vaccine at local pharmacy or Health Dept. Verbalized acceptance and understanding.  Screening Tests Health Maintenance  Topic Date Due   Zoster Vaccines- Shingrix (1 of 2) Never done   OPHTHALMOLOGY EXAM  03/01/2022   COVID-19 Vaccine (5 - 2023-24 season) 04/14/2022   HEMOGLOBIN A1C  11/21/2022   Diabetic kidney evaluation - Urine ACR   02/23/2023   FOOT EXAM  02/23/2023   Diabetic kidney evaluation - eGFR measurement  03/13/2023   INFLUENZA VACCINE  03/15/2023   Medicare Annual Wellness (AWV)  12/26/2023   COLONOSCOPY (Pts 45-75yrs Insurance coverage will need to be confirmed)  05/10/2025   DTaP/Tdap/Td (2 - Td or Tdap) 07/31/2028   Hepatitis C Screening  Completed   HIV Screening  Completed   HPV VACCINES  Aged Out    Health Maintenance  Health Maintenance Due  Topic Date Due   Zoster Vaccines- Shingrix (1 of 2) Never done   OPHTHALMOLOGY EXAM  03/01/2022   COVID-19 Vaccine (5 - 2023-24 season) 04/14/2022   HEMOGLOBIN A1C  11/21/2022    Colorectal cancer screening: Type of screening: Colonoscopy. Completed 05/11/15. Repeat every 10 years  Lung Cancer Screening: (Low Dose CT Chest recommended if Age 53-80 years, 30 pack-year currently smoking OR have quit w/in 15years.) does not qualify.   Lung Cancer Screening Referral: n/a  Additional Screening:  Hepatitis C Screening: does qualify; Completed 11/03/20  Vision Screening: Recommended annual ophthalmology exams for early detection of glaucoma and other disorders of the eye. Is the patient up to date with their annual eye exam?  Yes  Who is the provider or what is the name of the office in which the patient attends annual eye exams? Unable to name provider If pt is not established with a provider, would they like to be referred to a provider to establish care? No .   Dental Screening: Recommended annual dental exams for proper oral hygiene  Community Resource Referral / Chronic Care Management: CRR required this visit?  No   CCM required this visit?  No      Plan:     I have personally reviewed and noted the following in the patient's chart:   Medical and social history Use of alcohol, tobacco or illicit drugs  Current medications and supplements including opioid prescriptions. Patient is not currently taking opioid prescriptions. Functional  ability and status Nutritional status Physical activity Advanced directives List of other physicians Hospitalizations, surgeries, and ER visits in previous 12 months Vitals Screenings to include cognitive, depression, and falls Referrals and appointments  In addition, I have reviewed and discussed with patient certain preventive protocols, quality metrics, and best practice recommendations. A written personalized care plan for preventive services as well as general preventive health recommendations were provided to patient.     Durwin Nora, [place]   09/27/863   Due to this being a virtual visit, the after visit summary with patients personalized plan was offered to patient via mail or my-chart. Patient  would like to access on my-chart  Nurse Notes: No concerns

## 2022-12-26 NOTE — Patient Instructions (Signed)
Austin Vaughan , Thank you for taking time to come for your Medicare Wellness Visit. I appreciate your ongoing commitment to your health goals. Please review the following plan we discussed and let me know if I can assist you in the future.   These are the goals we discussed:  Goals      Increase physical activity        This is a list of the screening recommended for you and due dates:  Health Maintenance  Topic Date Due   Zoster (Shingles) Vaccine (1 of 2) Never done   Eye exam for diabetics  03/01/2022   COVID-19 Vaccine (5 - 2023-24 season) 04/14/2022   Hemoglobin A1C  11/21/2022   Yearly kidney health urinalysis for diabetes  02/23/2023   Complete foot exam   02/23/2023   Yearly kidney function blood test for diabetes  03/13/2023   Flu Shot  03/15/2023   Medicare Annual Wellness Visit  12/26/2023   Colon Cancer Screening  05/10/2025   DTaP/Tdap/Td vaccine (2 - Td or Tdap) 07/31/2028   Hepatitis C Screening: USPSTF Recommendation to screen - Ages 24-79 yo.  Completed   HIV Screening  Completed   HPV Vaccine  Aged Out    Advanced directives: Information on Advanced Care Planning can be found at [facility] Advance Health Care Directives Advance Health Care Directives ([email or web address]    Conditions/risks identified: Aim for 30 minutes of exercise or brisk walking, 6-8 glasses of water, and 5 servings of fruits and vegetables each day.   Next appointment: Follow up in one year for your annual wellness visit   Preventive Care 40-64 Years, Male Preventive care refers to lifestyle choices and visits with your health care provider that can promote health and wellness. What does preventive care include? A yearly physical exam. This is also called an annual well check. Dental exams once or twice a year. Routine eye exams. Ask your health care provider how often you should have your eyes checked. Personal lifestyle choices, including: Daily care of your teeth and  gums. Regular physical activity. Eating a healthy diet. Avoiding tobacco and drug use. Limiting alcohol use. Practicing safe sex. Taking low-dose aspirin every day starting at age 60. What happens during an annual well check? The services and screenings done by your health care provider during your annual well check will depend on your age, overall health, lifestyle risk factors, and family history of disease. Counseling  Your health care provider may ask you questions about your: Alcohol use. Tobacco use. Drug use. Emotional well-being. Home and relationship well-being. Sexual activity. Eating habits. Work and work Astronomer. Screening  You may have the following tests or measurements: Height, weight, and BMI. Blood pressure. Lipid and cholesterol levels. These may be checked every 5 years, or more frequently if you are over 107 years old. Skin check. Lung cancer screening. You may have this screening every year starting at age 5 if you have a 30-pack-year history of smoking and currently smoke or have quit within the past 15 years. Fecal occult blood test (FOBT) of the stool. You may have this test every year starting at age 57. Flexible sigmoidoscopy or colonoscopy. You may have a sigmoidoscopy every 5 years or a colonoscopy every 10 years starting at age 31. Prostate cancer screening. Recommendations will vary depending on your family history and other risks. Hepatitis C blood test. Hepatitis B blood test. Sexually transmitted disease (STD) testing. Diabetes screening. This is done by checking your  blood sugar (glucose) after you have not eaten for a while (fasting). You may have this done every 1-3 years. Discuss your test results, treatment options, and if necessary, the need for more tests with your health care provider. Vaccines  Your health care provider may recommend certain vaccines, such as: Influenza vaccine. This is recommended every year. Tetanus, diphtheria, and  acellular pertussis (Tdap, Td) vaccine. You may need a Td booster every 10 years. Zoster vaccine. You may need this after age 27. Pneumococcal 13-valent conjugate (PCV13) vaccine. You may need this if you have certain conditions and have not been vaccinated. Pneumococcal polysaccharide (PPSV23) vaccine. You may need one or two doses if you smoke cigarettes or if you have certain conditions. Talk to your health care provider about which screenings and vaccines you need and how often you need them. This information is not intended to replace advice given to you by your health care provider. Make sure you discuss any questions you have with your health care provider. Document Released: 08/27/2015 Document Revised: 04/19/2016 Document Reviewed: 06/01/2015 Elsevier Interactive Patient Education  2017 [employer].  Fall Prevention in the Home Falls can cause injuries. They can happen to people of all ages. There are many things you can do to make your home safe and to help prevent falls. What can I do on the outside of my home? Regularly fix the edges of walkways and driveways and fix any cracks. Remove anything that might make you trip as you walk through a door, such as a raised step or threshold. Trim any bushes or trees on the path to your home. Use bright outdoor lighting. Clear any walking paths of anything that might make someone trip, such as rocks or tools. Regularly check to see if handrails are loose or broken. Make sure that both sides of any steps have handrails. Any raised decks and porches should have guardrails on the edges. Have any leaves, snow, or ice cleared regularly. Use sand or salt on walking paths during winter. Clean up any spills in your garage right away. This includes oil or grease spills. What can I do in the bathroom? Use night lights. Install grab bars by the toilet and in the tub and shower. Do not use towel bars as grab bars. Use non-skid mats or decals in the  tub or shower. If you need to sit down in the shower, use a plastic, non-slip stool. Keep the floor dry. Clean up any water that spills on the floor as soon as it happens. Remove soap buildup in the tub or shower regularly. Attach bath mats securely with double-sided non-slip rug tape. Do not have throw rugs and other things on the floor that can make you trip. What can I do in the bedroom? Use night lights. Make sure that you have a light by your bed that is easy to reach. Do not use any sheets or blankets that are too big for your bed. They should not hang down onto the floor. Have a firm chair that has side arms. You can use this for support while you get dressed. Do not have throw rugs and other things on the floor that can make you trip. What can I do in the kitchen? Clean up any spills right away. Avoid walking on wet floors. Keep items that you use a lot in easy-to-reach places. If you need to reach something above you, use a strong step stool that has a grab bar. Keep electrical cords out of  the way. Do not use floor polish or wax that makes floors slippery. If you must use wax, use non-skid floor wax. Do not have throw rugs and other things on the floor that can make you trip. What can I do with my stairs? Do not leave any items on the stairs. Make sure that there are handrails on both sides of the stairs and use them. Fix handrails that are broken or loose. Make sure that handrails are as long as the stairways. Check any carpeting to make sure that it is firmly attached to the stairs. Fix any carpet that is loose or worn. Avoid having throw rugs at the top or bottom of the stairs. If you do have throw rugs, attach them to the floor with carpet tape. Make sure that you have a light switch at the top of the stairs and the bottom of the stairs. If you do not have them, ask someone to add them for you. What else can I do to help prevent falls? Wear shoes that: Do not have high  heels. Have rubber bottoms. Are comfortable and fit you well. Are closed at the toe. Do not wear sandals. If you use a stepladder: Make sure that it is fully opened. Do not climb a closed stepladder. Make sure that both sides of the stepladder are locked into place. Ask someone to hold it for you, if possible. Clearly mark and make sure that you can see: Any grab bars or handrails. First and last steps. Where the edge of each step is. Use tools that help you move around (mobility aids) if they are needed. These include: Canes. Walkers. Scooters. Crutches. Turn on the lights when you go into a dark area. Replace any light bulbs as soon as they burn out. Set up your furniture so you have a clear path. Avoid moving your furniture around. If any of your floors are uneven, fix them. If there are any pets around you, be aware of where they are. Review your medicines with your doctor. Some medicines can make you feel dizzy. This can increase your chance of falling. Ask your doctor what other things that you can do to help prevent falls. This information is not intended to replace advice given to you by your health care provider. Make sure you discuss any questions you have with your health care provider. Document Released: 05/27/2009 Document Revised: 01/06/2016 Document Reviewed: 09/04/2014 Elsevier Interactive Patient Education  2017 [employer].

## 2023-01-05 ENCOUNTER — Other Ambulatory Visit: Payer: Self-pay | Admitting: Critical Care Medicine

## 2023-01-12 ENCOUNTER — Ambulatory Visit (INDEPENDENT_AMBULATORY_CARE_PROVIDER_SITE_OTHER): Payer: Medicare Other | Admitting: Podiatry

## 2023-01-12 DIAGNOSIS — L089 Local infection of the skin and subcutaneous tissue, unspecified: Secondary | ICD-10-CM | POA: Diagnosis not present

## 2023-01-12 DIAGNOSIS — L539 Erythematous condition, unspecified: Secondary | ICD-10-CM | POA: Diagnosis not present

## 2023-01-12 MED ORDER — DOXYCYCLINE HYCLATE 100 MG PO TABS: 100.0000 mg | ORAL_TABLET | Freq: Two times a day (BID) | ORAL | 0 refills | Status: AC

## 2023-01-12 NOTE — Progress Notes (Signed)
Subjective:  Patient ID: Austin Vaughan, male    DOB: [birth year],  MRN: [id number]  Chief Complaint  Patient presents with   Diabetes    Left foot 5th toe     53 y.o. male presents with the above complaint.  Patient presents with right fifth digit skin epidermal lysis.  Patient states that he was worried that was peeling.  He does not want to lose the toe just like he did on the other side.  He denies any other complaints his sugars are better.  He has not seen anyone as prior to seeing me pain scale 7 out of 10.   Review of Systems: Negative except as noted in the HPI. Denies N/V/F/Ch.  Past Medical History:  Diagnosis Date   Coronary artery disease    Diabetes mellitus without complication (HCC)    Diabetic foot ulcer (HCC) 08/23/2019   GERD (gastroesophageal reflux disease) 06/21/2021   Hypertension    Sleep apnea     Current Outpatient Medications:    doxycycline (VIBRA-TABS) 100 MG tablet, Take 1 tablet (100 mg total) by mouth 2 (two) times daily., Disp: 20 tablet, Rfl: 0   albuterol (VENTOLIN HFA) 108 (90 Base) MCG/ACT inhaler, TAKE 2 PUFFS BY MOUTH EVERY 6 HOURS AS NEEDED FOR WHEEZE OR SHORTNESS OF BREATH (Patient taking differently: Inhale 2 puffs into the lungs every 6 (six) hours as needed for wheezing or shortness of breath.), Disp: 8.5 each, Rfl: 11   aspirin EC 81 MG tablet, Take 81 mg by mouth daily., Disp: , Rfl:    atorvastatin (LIPITOR) 80 MG tablet, Take 1 tablet (80 mg total) by mouth daily., Disp: 90 tablet, Rfl: 3   cetirizine (ZYRTEC) 10 MG tablet, Take 1 tablet (10 mg total) by mouth daily., Disp: 30 tablet, Rfl: 11   Continuous Blood Gluc Sensor (DEXCOM G6 SENSOR) MISC, 1 Device by Does not apply route as directed., Disp: 3 each, Rfl: 11   Continuous Blood Gluc Transmit (DEXCOM G6 TRANSMITTER) MISC, 1 Device by Does not apply route as directed., Disp: 1 each, Rfl: 3   dapagliflozin propanediol (FARXIGA) 10 MG TABS tablet, Take 1 tablet (10 mg total) by mouth  daily before breakfast., Disp: 90 tablet, Rfl: 2   dicyclomine (BENTYL) 20 MG tablet, TAKE 1 TABLET (20 MG TOTAL) BY MOUTH 4 (FOUR) TIMES DAILY AS NEEDED (ABDOMINAL CRAMPING)., Disp: 360 tablet, Rfl: 1   EPINEPHrine (EPIPEN JR) 0.15 MG/0.3ML injection, Inject 0.15 mg into the muscle as needed for anaphylaxis., Disp: 2 each, Rfl: 2   gabapentin (NEURONTIN) 600 MG tablet, Take 1 tablet (600 mg total) by mouth 2 (two) times daily AND 2 tablets (1,200 mg total) at bedtime., Disp: 120 tablet, Rfl: 0   gentamicin ointment (GARAMYCIN) 0.1 %, Apply 1 Application topically daily., Disp: 30 g, Rfl: 0   hydrOXYzine (VISTARIL) 25 MG capsule, Take 1 capsule (25 mg total) by mouth every 8 (eight) hours as needed for itching., Disp: 60 capsule, Rfl: 0   Insulin Disposable Pump (OMNIPOD 5 G6 INTRO, GEN 5,) KIT, 1 Device by Does not apply route every other day., Disp: 1 kit, Rfl: 0   Insulin Disposable Pump (OMNIPOD 5 G6 INTRO, GEN 5,) KIT, 1 Device by Does not apply route every other day., Disp: 1 kit, Rfl: 0   Insulin Disposable Pump (OMNIPOD 5 G6 POD, GEN 5,) MISC, 1 Device by Does not apply route every other day., Disp: 9 each, Rfl: 2   insulin lispro (HUMALOG KWIKPEN) 200 UNIT/ML  KwikPen, Max daily 100 units, Disp: 15 mL, Rfl: 3   metFORMIN (GLUCOPHAGE) 1000 MG tablet, Take 1 tablet (1,000 mg total) by mouth daily with breakfast., Disp: 90 tablet, Rfl: 2   metoprolol succinate (TOPROL-XL) 100 MG 24 hr tablet, Take 1 tablet (100 mg total) by mouth daily. Take with or immediately following a meal., Disp: 90 tablet, Rfl: 3   montelukast (SINGULAIR) 10 MG tablet, TAKE 1 TABLET BY MOUTH EVERY DAY, Disp: 30 tablet, Rfl: 0   nitroGLYCERIN (NITRODUR - DOSED IN MG/24 HR) 0.4 mg/hr patch, Place 1 patch (0.4 mg total) onto the skin daily., Disp: 30 patch, Rfl: 12   nitroGLYCERIN (NITROSTAT) 0.4 MG SL tablet, PLACE 1 TABLET UNDER THE TONGUE EVERY 5 MINUTES AS NEEDED FOR CHEST PAIN. (Patient taking differently: Place 0.4 mg  under the tongue every 5 (five) minutes as needed for chest pain.), Disp: 25 tablet, Rfl: 3   nortriptyline (PAMELOR) 50 MG capsule, TAKE 1 CAPSULE BY MOUTH EVERY DAY AT BEDTIME, Disp: 90 capsule, Rfl: 0   pantoprazole (PROTONIX) 40 MG tablet, Take 1 tablet (40 mg total) by mouth daily., Disp: 90 tablet, Rfl: 3   pregabalin (LYRICA) 300 MG capsule, Take 1 capsule (300 mg total) by mouth daily., Disp: 60 capsule, Rfl: 2   ramipril (ALTACE) 10 MG capsule, Take 1 capsule (10 mg total) by mouth daily., Disp: 90 capsule, Rfl: 3   rOPINIRole (REQUIP) 4 MG tablet, TAKE 1 TABLET(4 MG) BY MOUTH AT BEDTIME, Disp: 30 tablet, Rfl: 0   tadalafil (CIALIS) 10 MG tablet, Take 1 tablet (10 mg total) by mouth every other day as needed for erectile dysfunction., Disp: 10 tablet, Rfl: 1   triamcinolone ointment (KENALOG) 0.5 %, Apply 1 Application topically 2 (two) times daily., Disp: 30 g, Rfl: 0  Social History   Tobacco Use  Smoking Status Never  Smokeless Tobacco Never    Allergies  Allergen Reactions   Ambien [Zolpidem Tartrate] Other (See Comments)    HALLUCINATIONS   Shellfish Allergy Anaphylaxis    Seafood   Objective:  There were no vitals filed for this visit. There is no height or weight on file to calculate BMI. Constitutional Well developed. Well nourished.  Vascular Dorsalis pedis pulses palpable bilaterally. Posterior tibial pulses palpable bilaterally. Capillary refill normal to all digits.  No cyanosis or clubbing noted. Pedal hair growth normal.  Neurologic Normal speech. Oriented to person, place, and time. Epicritic sensation to light touch grossly present bilaterally.  Dermatologic Skin epidermolysis circumferential on the fifth digit.  No signs of infection noted.  Appears to be resolving infection.  Orthopedic: Normal joint ROM without pain or crepitus bilaterally. No visible deformities. No bony tenderness.   Radiographs: None Assessment:   1. Erythema   2. Skin  infection    Plan:  Patient was evaluated and treated and all questions answered.  Skin epidermal lysis fifth digit resolving infection -All questions and concerns were discussed with the patient.  The likely etiology is underneath the fifth digit with skin fissure that may have led and some infection superficially.  No erythema and no wound noted.  At this time patient will benefit from 10 days of doxycycline.  If there is no resolve meant he will come and see me.  No follow-ups on file.

## 2023-01-15 MED ORDER — HUMALOG KWIKPEN 200 UNIT/ML ~~LOC~~ SOPN: PEN_INJECTOR | SUBCUTANEOUS | 3 refills | Status: AC

## 2023-01-15 NOTE — Addendum Note (Signed)
Addended by: Lisabeth Pick on: 01/15/2023 09:59 AM   Modules accepted: Orders

## 2023-01-15 NOTE — Telephone Encounter (Signed)
Done

## 2023-01-15 NOTE — Telephone Encounter (Signed)
Patient contacted to see if he ever picked up the U-200 insulin.  He said that it was not approved.  I told him that it was approved on 09/15/22.  He was reminded that we need to change the pump settings before he can start using this.  He asked that the prescription be resent to [facility].  Thank you

## 2023-01-19 LAB — HM COLONOSCOPY

## 2023-03-10 ENCOUNTER — Other Ambulatory Visit: Payer: Self-pay | Admitting: Critical Care Medicine

## 2023-03-12 NOTE — Telephone Encounter (Signed)
Unable to refill per protocol, Rx expired. Discontinued 03/13/22.  Requested Prescriptions  Pending Prescriptions Disp Refills   ramipril (ALTACE) 5 MG capsule [Pharmacy Med Name: RAMIPRIL 5 MG CAPSULE] 180 capsule 3    Sig: TAKE 2 CAPSULES (10 MG TOTAL) BY MOUTH DAILY.     Cardiovascular:  ACE Inhibitors Failed - 03/10/2023  1:14 AM      Failed - Cr in normal range and within 180 days    Creatinine, Ser  Date Value Ref Range Status  03/12/2022 0.69 0.61 - 1.24 mg/dL Final   Creatinine, Urine  Date Value Ref Range Status  04/07/2020 69 20 - 320 mg/dL Final         Failed - K in normal range and within 180 days    Potassium  Date Value Ref Range Status  03/12/2022 4.2 3.5 - 5.1 mmol/L Final         Passed - Patient is not pregnant      Passed - Last BP in normal range    BP Readings from Last 1 Encounters:  05/22/22 132/84         Passed - Valid encounter within last 6 months    Recent Outpatient Visits           1 year ago Type 2 diabetes mellitus with diabetic polyneuropathy, with long-term current use of insulin (HCC)   [place] Storm Frisk, MD   1 year ago Type 2 diabetes mellitus with diabetic polyneuropathy, with long-term current use of insulin [facility])   [place] [facility] Storm Frisk, MD   2 years ago Type 2 diabetes mellitus with diabetic polyneuropathy, with long-term current use of insulin [facility])   [place] [facility] Storm Frisk, MD   2 years ago Type 2 diabetes mellitus with diabetic polyneuropathy, with long-term current use of insulin [facility])   [place] [facility] Storm Frisk, MD   2 years ago Mild persistent asthma, unspecified whether complicated   [facility], [place], [place]

## 2023-03-29 ENCOUNTER — Other Ambulatory Visit: Payer: Self-pay | Admitting: Critical Care Medicine

## 2023-03-29 ENCOUNTER — Other Ambulatory Visit: Payer: Self-pay | Admitting: Podiatry

## 2023-04-19 ENCOUNTER — Other Ambulatory Visit: Payer: Self-pay | Admitting: Critical Care Medicine

## 2023-12-31 ENCOUNTER — Other Ambulatory Visit: Payer: Self-pay | Admitting: Critical Care Medicine

## 2024-01-01 ENCOUNTER — Ambulatory Visit: Payer: Medicare Other
